# Patient Record
Sex: Female | Born: 1950
Health system: Southern US, Community
[De-identification: ages and names within clinical notes are randomized; demographics above are authoritative.]

## PROBLEM LIST (undated history)

## (undated) DIAGNOSIS — G43909 Migraine, unspecified, not intractable, without status migrainosus: Secondary | ICD-10-CM

## (undated) DIAGNOSIS — N879 Dysplasia of cervix uteri, unspecified: Secondary | ICD-10-CM

## (undated) DIAGNOSIS — M2460 Ankylosis, unspecified joint: Secondary | ICD-10-CM

## (undated) DIAGNOSIS — M545 Low back pain, unspecified: Secondary | ICD-10-CM

## (undated) DIAGNOSIS — F32A Depression, unspecified: Secondary | ICD-10-CM

## (undated) DIAGNOSIS — E079 Disorder of thyroid, unspecified: Secondary | ICD-10-CM

## (undated) DIAGNOSIS — F419 Anxiety disorder, unspecified: Secondary | ICD-10-CM

## (undated) DIAGNOSIS — T4145XA Adverse effect of unspecified anesthetic, initial encounter: Secondary | ICD-10-CM

## (undated) DIAGNOSIS — K56609 Unspecified intestinal obstruction, unspecified as to partial versus complete obstruction: Secondary | ICD-10-CM

## (undated) DIAGNOSIS — D45 Polycythemia vera: Secondary | ICD-10-CM

## (undated) DIAGNOSIS — R351 Nocturia: Secondary | ICD-10-CM

## (undated) DIAGNOSIS — G4730 Sleep apnea, unspecified: Secondary | ICD-10-CM

## (undated) DIAGNOSIS — T8859XA Other complications of anesthesia, initial encounter: Secondary | ICD-10-CM

## (undated) DIAGNOSIS — B029 Zoster without complications: Secondary | ICD-10-CM

## (undated) DIAGNOSIS — M797 Fibromyalgia: Secondary | ICD-10-CM

## (undated) DIAGNOSIS — M069 Rheumatoid arthritis, unspecified: Secondary | ICD-10-CM

## (undated) DIAGNOSIS — F329 Major depressive disorder, single episode, unspecified: Secondary | ICD-10-CM

## (undated) DIAGNOSIS — R0602 Shortness of breath: Secondary | ICD-10-CM

## (undated) DIAGNOSIS — M459 Ankylosing spondylitis of unspecified sites in spine: Secondary | ICD-10-CM

## (undated) DIAGNOSIS — E7800 Pure hypercholesterolemia, unspecified: Secondary | ICD-10-CM

## (undated) DIAGNOSIS — Z8719 Personal history of other diseases of the digestive system: Secondary | ICD-10-CM

## (undated) DIAGNOSIS — I10 Essential (primary) hypertension: Secondary | ICD-10-CM

## (undated) DIAGNOSIS — M1990 Unspecified osteoarthritis, unspecified site: Secondary | ICD-10-CM

## (undated) DIAGNOSIS — Z1371 Encounter for nonprocreative screening for genetic disease carrier status: Secondary | ICD-10-CM

## (undated) DIAGNOSIS — R011 Cardiac murmur, unspecified: Secondary | ICD-10-CM

## (undated) DIAGNOSIS — J45909 Unspecified asthma, uncomplicated: Secondary | ICD-10-CM

## (undated) DIAGNOSIS — D573 Sickle-cell trait: Secondary | ICD-10-CM

## (undated) DIAGNOSIS — E039 Hypothyroidism, unspecified: Secondary | ICD-10-CM

## (undated) DIAGNOSIS — K828 Other specified diseases of gallbladder: Secondary | ICD-10-CM

## (undated) DIAGNOSIS — K5790 Diverticulosis of intestine, part unspecified, without perforation or abscess without bleeding: Secondary | ICD-10-CM

## (undated) DIAGNOSIS — K219 Gastro-esophageal reflux disease without esophagitis: Secondary | ICD-10-CM

## (undated) DIAGNOSIS — R7302 Impaired glucose tolerance (oral): Secondary | ICD-10-CM

## (undated) HISTORY — DX: Migraine, unspecified, not intractable, without status migrainosus: G43.909

## (undated) HISTORY — DX: Essential (primary) hypertension: I10

## (undated) HISTORY — DX: Fibromyalgia: M79.7

## (undated) HISTORY — PX: GYNECOLOGIC CRYOSURGERY: SHX857

## (undated) HISTORY — PX: OOPHORECTOMY: SHX86

## (undated) HISTORY — DX: Ankylosing spondylitis of unspecified sites in spine: M45.9

## (undated) HISTORY — DX: Dysplasia of cervix uteri, unspecified: N87.9

## (undated) HISTORY — PX: TOTAL HIP ARTHROPLASTY: SHX124

## (undated) HISTORY — PX: BREAST SURGERY: SHX581

## (undated) HISTORY — PX: JOINT REPLACEMENT: SHX530

## (undated) HISTORY — DX: Gastro-esophageal reflux disease without esophagitis: K21.9

## (undated) HISTORY — DX: Encounter for nonprocreative screening for genetic disease carrier status: Z13.71

## (undated) HISTORY — PX: TONSILLECTOMY: SUR1361

## (undated) HISTORY — DX: Polycythemia vera: D45

## (undated) HISTORY — DX: Pure hypercholesterolemia, unspecified: E78.00

## (undated) HISTORY — DX: Sickle-cell trait: D57.3

## (undated) HISTORY — DX: Rheumatoid arthritis, unspecified: M06.9

## (undated) HISTORY — PX: PELVIC LAPAROSCOPY: SHX162

## (undated) HISTORY — PX: COLPOSCOPY: SHX161

## (undated) HISTORY — PX: HERNIA REPAIR: SHX51

## (undated) HISTORY — DX: Zoster without complications: B02.9

## (undated) HISTORY — DX: Ankylosis, unspecified joint: M24.60

## (undated) HISTORY — DX: Impaired glucose tolerance (oral): R73.02

## (undated) HISTORY — DX: Disorder of thyroid, unspecified: E07.9

## (undated) HISTORY — PX: CHOLECYSTECTOMY: SHX55

## (undated) HISTORY — DX: Unspecified intestinal obstruction, unspecified as to partial versus complete obstruction: K56.609

## (undated) HISTORY — PX: ABDOMINAL HYSTERECTOMY: SHX81

---

## 1981-01-23 HISTORY — PX: VAGINAL HYSTERECTOMY: SUR661

## 1997-01-23 HISTORY — PX: BREAST EXCISIONAL BIOPSY: SUR124

## 1997-09-01 ENCOUNTER — Ambulatory Visit (HOSPITAL_COMMUNITY): Admission: RE | Admit: 1997-09-01 | Discharge: 1997-09-01 | Payer: Self-pay | Admitting: Obstetrics and Gynecology

## 1998-09-01 ENCOUNTER — Other Ambulatory Visit: Admission: RE | Admit: 1998-09-01 | Discharge: 1998-09-01 | Payer: Self-pay | Admitting: Obstetrics and Gynecology

## 1998-09-07 ENCOUNTER — Ambulatory Visit (HOSPITAL_COMMUNITY): Admission: RE | Admit: 1998-09-07 | Discharge: 1998-09-07 | Payer: Self-pay | Admitting: Obstetrics and Gynecology

## 1998-09-07 ENCOUNTER — Encounter: Payer: Self-pay | Admitting: Obstetrics and Gynecology

## 1999-03-12 ENCOUNTER — Encounter: Payer: Self-pay | Admitting: Pulmonary Disease

## 1999-03-12 ENCOUNTER — Encounter: Admission: RE | Admit: 1999-03-12 | Discharge: 1999-03-12 | Payer: Self-pay | Admitting: Pulmonary Disease

## 1999-04-29 ENCOUNTER — Ambulatory Visit (HOSPITAL_COMMUNITY): Admission: RE | Admit: 1999-04-29 | Discharge: 1999-04-29 | Payer: Self-pay | Admitting: Neurosurgery

## 1999-04-29 ENCOUNTER — Encounter: Payer: Self-pay | Admitting: Neurosurgery

## 1999-07-06 ENCOUNTER — Encounter: Admission: RE | Admit: 1999-07-06 | Discharge: 1999-07-06 | Payer: Self-pay | Admitting: Obstetrics and Gynecology

## 1999-07-06 ENCOUNTER — Encounter: Payer: Self-pay | Admitting: Obstetrics and Gynecology

## 1999-09-09 ENCOUNTER — Ambulatory Visit (HOSPITAL_BASED_OUTPATIENT_CLINIC_OR_DEPARTMENT_OTHER): Admission: RE | Admit: 1999-09-09 | Discharge: 1999-09-09 | Payer: Self-pay

## 1999-09-09 ENCOUNTER — Encounter (INDEPENDENT_AMBULATORY_CARE_PROVIDER_SITE_OTHER): Payer: Self-pay | Admitting: Specialist

## 2000-01-13 ENCOUNTER — Other Ambulatory Visit: Admission: RE | Admit: 2000-01-13 | Discharge: 2000-01-13 | Payer: Self-pay | Admitting: Obstetrics and Gynecology

## 2000-05-16 ENCOUNTER — Emergency Department (HOSPITAL_COMMUNITY): Admission: EM | Admit: 2000-05-16 | Discharge: 2000-05-16 | Payer: Self-pay | Admitting: Emergency Medicine

## 2000-05-16 ENCOUNTER — Encounter: Payer: Self-pay | Admitting: Emergency Medicine

## 2000-05-17 ENCOUNTER — Emergency Department (HOSPITAL_COMMUNITY): Admission: EM | Admit: 2000-05-17 | Discharge: 2000-05-18 | Payer: Self-pay | Admitting: Emergency Medicine

## 2000-05-18 ENCOUNTER — Encounter: Payer: Self-pay | Admitting: Emergency Medicine

## 2000-05-31 ENCOUNTER — Encounter (INDEPENDENT_AMBULATORY_CARE_PROVIDER_SITE_OTHER): Payer: Self-pay | Admitting: Specialist

## 2000-05-31 ENCOUNTER — Other Ambulatory Visit: Admission: RE | Admit: 2000-05-31 | Discharge: 2000-05-31 | Payer: Self-pay | Admitting: Gastroenterology

## 2000-07-09 ENCOUNTER — Encounter: Payer: Self-pay | Admitting: Obstetrics and Gynecology

## 2000-07-09 ENCOUNTER — Ambulatory Visit (HOSPITAL_COMMUNITY): Admission: RE | Admit: 2000-07-09 | Discharge: 2000-07-09 | Payer: Self-pay | Admitting: Obstetrics and Gynecology

## 2000-07-24 ENCOUNTER — Encounter: Payer: Self-pay | Admitting: Specialist

## 2000-07-24 ENCOUNTER — Ambulatory Visit (HOSPITAL_COMMUNITY): Admission: RE | Admit: 2000-07-24 | Discharge: 2000-07-24 | Payer: Self-pay | Admitting: Specialist

## 2000-09-03 ENCOUNTER — Encounter (INDEPENDENT_AMBULATORY_CARE_PROVIDER_SITE_OTHER): Payer: Self-pay

## 2000-09-04 ENCOUNTER — Inpatient Hospital Stay (HOSPITAL_COMMUNITY): Admission: EM | Admit: 2000-09-04 | Discharge: 2000-09-06 | Payer: Self-pay | Admitting: Obstetrics and Gynecology

## 2000-09-05 ENCOUNTER — Encounter: Payer: Self-pay | Admitting: Obstetrics and Gynecology

## 2000-10-08 ENCOUNTER — Encounter: Admission: RE | Admit: 2000-10-08 | Discharge: 2000-10-08 | Payer: Self-pay | Admitting: Obstetrics and Gynecology

## 2000-10-08 ENCOUNTER — Encounter: Payer: Self-pay | Admitting: Obstetrics and Gynecology

## 2001-01-24 ENCOUNTER — Encounter: Payer: Self-pay | Admitting: *Deleted

## 2001-01-24 ENCOUNTER — Ambulatory Visit (HOSPITAL_COMMUNITY): Admission: RE | Admit: 2001-01-24 | Discharge: 2001-01-24 | Payer: Self-pay | Admitting: *Deleted

## 2001-02-01 ENCOUNTER — Other Ambulatory Visit: Admission: RE | Admit: 2001-02-01 | Discharge: 2001-02-01 | Payer: Self-pay | Admitting: Obstetrics and Gynecology

## 2001-07-06 ENCOUNTER — Encounter: Payer: Self-pay | Admitting: Internal Medicine

## 2001-07-06 ENCOUNTER — Ambulatory Visit (HOSPITAL_COMMUNITY): Admission: RE | Admit: 2001-07-06 | Discharge: 2001-07-06 | Payer: Self-pay | Admitting: Internal Medicine

## 2001-10-14 ENCOUNTER — Encounter: Payer: Self-pay | Admitting: Obstetrics and Gynecology

## 2001-10-14 ENCOUNTER — Encounter: Admission: RE | Admit: 2001-10-14 | Discharge: 2001-10-14 | Payer: Self-pay | Admitting: Obstetrics and Gynecology

## 2002-03-27 ENCOUNTER — Encounter: Payer: Self-pay | Admitting: Obstetrics and Gynecology

## 2002-03-27 ENCOUNTER — Encounter: Admission: RE | Admit: 2002-03-27 | Discharge: 2002-03-27 | Payer: Self-pay | Admitting: Obstetrics and Gynecology

## 2002-04-03 ENCOUNTER — Other Ambulatory Visit: Admission: RE | Admit: 2002-04-03 | Discharge: 2002-04-03 | Payer: Self-pay | Admitting: Obstetrics and Gynecology

## 2002-08-13 ENCOUNTER — Encounter: Payer: Self-pay | Admitting: Obstetrics and Gynecology

## 2002-08-13 ENCOUNTER — Encounter: Admission: RE | Admit: 2002-08-13 | Discharge: 2002-08-13 | Payer: Self-pay | Admitting: Obstetrics and Gynecology

## 2003-04-24 ENCOUNTER — Other Ambulatory Visit: Admission: RE | Admit: 2003-04-24 | Discharge: 2003-04-24 | Payer: Self-pay | Admitting: Obstetrics and Gynecology

## 2003-07-24 ENCOUNTER — Encounter: Admission: RE | Admit: 2003-07-24 | Discharge: 2003-07-24 | Payer: Self-pay | Admitting: Obstetrics and Gynecology

## 2003-09-01 ENCOUNTER — Ambulatory Visit (HOSPITAL_COMMUNITY): Admission: RE | Admit: 2003-09-01 | Discharge: 2003-09-01 | Payer: Self-pay | Admitting: Pulmonary Disease

## 2003-10-20 ENCOUNTER — Emergency Department (HOSPITAL_COMMUNITY): Admission: EM | Admit: 2003-10-20 | Discharge: 2003-10-20 | Payer: Self-pay | Admitting: Emergency Medicine

## 2004-01-21 ENCOUNTER — Ambulatory Visit: Payer: Self-pay | Admitting: Pulmonary Disease

## 2004-02-16 ENCOUNTER — Ambulatory Visit: Payer: Self-pay | Admitting: Pulmonary Disease

## 2004-03-06 ENCOUNTER — Ambulatory Visit (HOSPITAL_BASED_OUTPATIENT_CLINIC_OR_DEPARTMENT_OTHER): Admission: RE | Admit: 2004-03-06 | Discharge: 2004-03-06 | Payer: Self-pay | Admitting: Pulmonary Disease

## 2004-03-06 ENCOUNTER — Ambulatory Visit: Payer: Self-pay | Admitting: Pulmonary Disease

## 2004-04-14 ENCOUNTER — Ambulatory Visit: Payer: Self-pay | Admitting: Pulmonary Disease

## 2004-04-17 ENCOUNTER — Inpatient Hospital Stay (HOSPITAL_COMMUNITY): Admission: EM | Admit: 2004-04-17 | Discharge: 2004-04-22 | Payer: Self-pay | Admitting: Emergency Medicine

## 2004-04-25 ENCOUNTER — Other Ambulatory Visit: Admission: RE | Admit: 2004-04-25 | Discharge: 2004-04-25 | Payer: Self-pay | Admitting: Addiction Medicine

## 2004-04-29 ENCOUNTER — Ambulatory Visit: Payer: Self-pay | Admitting: Pulmonary Disease

## 2004-05-27 ENCOUNTER — Ambulatory Visit: Payer: Self-pay | Admitting: Pulmonary Disease

## 2004-05-30 ENCOUNTER — Ambulatory Visit: Payer: Self-pay | Admitting: Pulmonary Disease

## 2004-06-30 ENCOUNTER — Ambulatory Visit: Payer: Self-pay | Admitting: Pulmonary Disease

## 2004-07-01 ENCOUNTER — Emergency Department (HOSPITAL_COMMUNITY): Admission: EM | Admit: 2004-07-01 | Discharge: 2004-07-01 | Payer: Self-pay | Admitting: Emergency Medicine

## 2004-07-06 ENCOUNTER — Ambulatory Visit: Payer: Self-pay | Admitting: Pulmonary Disease

## 2004-08-08 ENCOUNTER — Ambulatory Visit: Payer: Self-pay | Admitting: Obstetrics and Gynecology

## 2004-09-22 ENCOUNTER — Ambulatory Visit: Payer: Self-pay | Admitting: Pulmonary Disease

## 2004-10-04 ENCOUNTER — Ambulatory Visit: Payer: Self-pay | Admitting: *Deleted

## 2004-11-07 ENCOUNTER — Ambulatory Visit: Payer: Self-pay

## 2004-11-21 ENCOUNTER — Ambulatory Visit: Payer: Self-pay | Admitting: Internal Medicine

## 2004-12-27 ENCOUNTER — Ambulatory Visit: Payer: Self-pay | Admitting: Cardiology

## 2004-12-28 ENCOUNTER — Ambulatory Visit: Payer: Self-pay | Admitting: Pulmonary Disease

## 2004-12-29 ENCOUNTER — Ambulatory Visit: Payer: Self-pay | Admitting: Family Medicine

## 2005-02-20 ENCOUNTER — Ambulatory Visit: Payer: Self-pay | Admitting: Cardiology

## 2005-02-21 ENCOUNTER — Ambulatory Visit: Payer: Self-pay

## 2005-02-28 ENCOUNTER — Ambulatory Visit: Payer: Self-pay | Admitting: Cardiology

## 2005-03-02 ENCOUNTER — Ambulatory Visit: Payer: Self-pay | Admitting: *Deleted

## 2005-05-22 ENCOUNTER — Ambulatory Visit: Payer: Self-pay | Admitting: Cardiology

## 2005-05-29 ENCOUNTER — Ambulatory Visit: Payer: Self-pay | Admitting: Cardiology

## 2005-06-21 ENCOUNTER — Other Ambulatory Visit: Admission: RE | Admit: 2005-06-21 | Discharge: 2005-06-21 | Payer: Self-pay | Admitting: Obstetrics and Gynecology

## 2005-07-27 ENCOUNTER — Ambulatory Visit: Payer: Self-pay | Admitting: Pulmonary Disease

## 2005-08-04 ENCOUNTER — Encounter: Admission: RE | Admit: 2005-08-04 | Discharge: 2005-08-04 | Payer: Self-pay | Admitting: Obstetrics and Gynecology

## 2005-08-08 ENCOUNTER — Ambulatory Visit: Payer: Self-pay | Admitting: Cardiology

## 2005-08-09 ENCOUNTER — Ambulatory Visit: Payer: Self-pay | Admitting: Pulmonary Disease

## 2005-08-15 ENCOUNTER — Ambulatory Visit: Payer: Self-pay | Admitting: Cardiology

## 2005-08-29 ENCOUNTER — Encounter: Admission: RE | Admit: 2005-08-29 | Discharge: 2005-08-29 | Payer: Self-pay | Admitting: Obstetrics and Gynecology

## 2005-08-30 ENCOUNTER — Ambulatory Visit: Payer: Self-pay | Admitting: Cardiology

## 2005-09-07 ENCOUNTER — Ambulatory Visit: Payer: Self-pay | Admitting: Cardiology

## 2005-09-13 ENCOUNTER — Encounter: Admission: RE | Admit: 2005-09-13 | Discharge: 2005-09-13 | Payer: Self-pay | Admitting: Orthopedic Surgery

## 2005-10-04 ENCOUNTER — Ambulatory Visit: Payer: Self-pay | Admitting: Pulmonary Disease

## 2005-10-05 ENCOUNTER — Encounter: Admission: RE | Admit: 2005-10-05 | Discharge: 2005-10-05 | Payer: Self-pay | Admitting: Orthopedic Surgery

## 2005-10-13 ENCOUNTER — Ambulatory Visit: Payer: Self-pay | Admitting: Pulmonary Disease

## 2005-11-01 ENCOUNTER — Ambulatory Visit: Payer: Self-pay | Admitting: Critical Care Medicine

## 2006-01-01 ENCOUNTER — Encounter: Admission: RE | Admit: 2006-01-01 | Discharge: 2006-01-01 | Payer: Self-pay | Admitting: Orthopedic Surgery

## 2006-02-22 ENCOUNTER — Ambulatory Visit: Payer: Self-pay | Admitting: Gastroenterology

## 2006-02-23 ENCOUNTER — Ambulatory Visit: Payer: Self-pay | Admitting: Gastroenterology

## 2006-06-24 ENCOUNTER — Encounter: Payer: Self-pay | Admitting: Internal Medicine

## 2006-07-12 ENCOUNTER — Other Ambulatory Visit: Admission: RE | Admit: 2006-07-12 | Discharge: 2006-07-12 | Payer: Self-pay | Admitting: Obstetrics and Gynecology

## 2006-08-06 ENCOUNTER — Ambulatory Visit (HOSPITAL_COMMUNITY): Admission: RE | Admit: 2006-08-06 | Discharge: 2006-08-06 | Payer: Self-pay | Admitting: Obstetrics and Gynecology

## 2006-08-20 ENCOUNTER — Ambulatory Visit: Payer: Self-pay | Admitting: Internal Medicine

## 2006-08-20 LAB — CONVERTED CEMR LAB
ALT: 19 units/L (ref 0–35)
AST: 18 units/L (ref 0–37)
Albumin: 3.3 g/dL — ABNORMAL LOW (ref 3.5–5.2)
Alkaline Phosphatase: 79 units/L (ref 39–117)
BUN: 9 mg/dL (ref 6–23)
Basophils Absolute: 0 10*3/uL (ref 0.0–0.1)
Basophils Relative: 0 % (ref 0.0–1.0)
CO2: 29 meq/L (ref 19–32)
Chloride: 109 meq/L (ref 96–112)
Creatinine, Ser: 0.8 mg/dL (ref 0.4–1.2)
HCT: 43.8 % (ref 36.0–46.0)
HDL: 33.4 mg/dL — ABNORMAL LOW (ref >39.0)
Hemoglobin, Urine: NEGATIVE
Hemoglobin: 15 g/dL (ref 12.0–15.0)
Ketones, ur: NEGATIVE mg/dL
LDL Cholesterol: 136 mg/dL — ABNORMAL HIGH (ref 0–99)
Leukocytes, UA: NEGATIVE
MCHC: 34.3 g/dL (ref 30.0–36.0)
Monocytes Relative: 4.4 % (ref 3.0–11.0)
Neutrophils Relative %: 77.4 % — ABNORMAL HIGH (ref 43.0–77.0)
RBC: 5.25 M/uL — ABNORMAL HIGH (ref 3.87–5.11)
RDW: 13.4 % (ref 11.5–14.6)
Specific Gravity, Urine: 1.015 (ref 1.000–1.03)
Total Bilirubin: 0.9 mg/dL (ref 0.3–1.2)
Total Protein, Urine: NEGATIVE mg/dL
Total Protein: 6.8 g/dL (ref 6.0–8.3)
Urobilinogen, UA: 0.2 (ref 0.0–1.0)
VLDL: 15 mg/dL (ref 0–40)
pH: 6 (ref 5.0–8.0)

## 2006-08-29 ENCOUNTER — Ambulatory Visit: Payer: Self-pay | Admitting: Pulmonary Disease

## 2006-09-19 ENCOUNTER — Ambulatory Visit: Payer: Self-pay | Admitting: Internal Medicine

## 2006-09-22 ENCOUNTER — Encounter: Payer: Self-pay | Admitting: Internal Medicine

## 2006-09-22 DIAGNOSIS — R5381 Other malaise: Secondary | ICD-10-CM | POA: Insufficient documentation

## 2006-09-22 DIAGNOSIS — M1990 Unspecified osteoarthritis, unspecified site: Secondary | ICD-10-CM | POA: Insufficient documentation

## 2006-09-22 DIAGNOSIS — E669 Obesity, unspecified: Secondary | ICD-10-CM

## 2006-09-22 DIAGNOSIS — M797 Fibromyalgia: Secondary | ICD-10-CM | POA: Insufficient documentation

## 2006-09-22 DIAGNOSIS — K589 Irritable bowel syndrome without diarrhea: Secondary | ICD-10-CM | POA: Insufficient documentation

## 2006-09-22 DIAGNOSIS — M810 Age-related osteoporosis without current pathological fracture: Secondary | ICD-10-CM | POA: Insufficient documentation

## 2006-09-22 DIAGNOSIS — K219 Gastro-esophageal reflux disease without esophagitis: Secondary | ICD-10-CM

## 2006-09-22 DIAGNOSIS — R609 Edema, unspecified: Secondary | ICD-10-CM | POA: Insufficient documentation

## 2006-09-22 DIAGNOSIS — F329 Major depressive disorder, single episode, unspecified: Secondary | ICD-10-CM

## 2006-09-22 DIAGNOSIS — Z8679 Personal history of other diseases of the circulatory system: Secondary | ICD-10-CM | POA: Insufficient documentation

## 2006-09-22 DIAGNOSIS — K279 Peptic ulcer, site unspecified, unspecified as acute or chronic, without hemorrhage or perforation: Secondary | ICD-10-CM | POA: Insufficient documentation

## 2006-09-22 DIAGNOSIS — E785 Hyperlipidemia, unspecified: Secondary | ICD-10-CM

## 2006-09-22 DIAGNOSIS — I872 Venous insufficiency (chronic) (peripheral): Secondary | ICD-10-CM | POA: Insufficient documentation

## 2006-09-22 DIAGNOSIS — D573 Sickle-cell trait: Secondary | ICD-10-CM | POA: Insufficient documentation

## 2006-09-22 DIAGNOSIS — J309 Allergic rhinitis, unspecified: Secondary | ICD-10-CM | POA: Insufficient documentation

## 2006-09-22 DIAGNOSIS — E0590 Thyrotoxicosis, unspecified without thyrotoxic crisis or storm: Secondary | ICD-10-CM | POA: Insufficient documentation

## 2006-09-22 DIAGNOSIS — IMO0001 Reserved for inherently not codable concepts without codable children: Secondary | ICD-10-CM | POA: Insufficient documentation

## 2006-09-22 DIAGNOSIS — G4733 Obstructive sleep apnea (adult) (pediatric): Secondary | ICD-10-CM | POA: Insufficient documentation

## 2006-09-22 DIAGNOSIS — R5383 Other fatigue: Secondary | ICD-10-CM

## 2006-09-22 DIAGNOSIS — I10 Essential (primary) hypertension: Secondary | ICD-10-CM

## 2006-10-29 ENCOUNTER — Ambulatory Visit: Payer: Self-pay | Admitting: Internal Medicine

## 2006-10-29 LAB — CONVERTED CEMR LAB
ALT: 11 units/L (ref 0–35)
AST: 15 units/L (ref 0–37)
Albumin: 3 g/dL — ABNORMAL LOW (ref 3.5–5.2)
Alkaline Phosphatase: 78 units/L (ref 39–117)
BUN: 10 mg/dL (ref 6–23)
Basophils Absolute: 0 10*3/uL (ref 0.0–0.1)
Basophils Relative: 0.5 % (ref 0.0–1.0)
Bilirubin, Direct: 0.1 mg/dL (ref 0.0–0.3)
CO2: 28 meq/L (ref 19–32)
Calcium: 8.8 mg/dL (ref 8.4–10.5)
Chloride: 110 meq/L (ref 96–112)
Creatinine, Ser: 0.8 mg/dL (ref 0.4–1.2)
Eosinophils Absolute: 0.1 10*3/uL (ref 0.0–0.6)
Eosinophils Relative: 1.1 % (ref 0.0–5.0)
GFR calc Af Amer: 96 mL/min
GFR calc non Af Amer: 80 mL/min
Glucose, Bld: 72 mg/dL (ref 70–99)
HCT: 39.9 % (ref 36.0–46.0)
Hemoglobin: 13.5 g/dL (ref 12.0–15.0)
Hgb A1c MFr Bld: 5.4 % (ref 4.6–6.0)
Lymphocytes Relative: 31.2 % (ref 12.0–46.0)
MCHC: 33.8 g/dL (ref 30.0–36.0)
MCV: 85.2 fL (ref 78.0–100.0)
Monocytes Absolute: 0.4 10*3/uL (ref 0.2–0.7)
Monocytes Relative: 6.2 % (ref 3.0–11.0)
Neutro Abs: 4.3 10*3/uL (ref 1.4–7.7)
Neutrophils Relative %: 61 % (ref 43.0–77.0)
Platelets: 282 10*3/uL (ref 150–400)
Potassium: 3.8 meq/L (ref 3.5–5.1)
RBC: 4.68 M/uL (ref 3.87–5.11)
RDW: 13.9 % (ref 11.5–14.6)
Sodium: 144 meq/L (ref 135–145)
TSH: 0.03 microintl units/mL — ABNORMAL LOW (ref 0.35–5.50)
Total Bilirubin: 0.6 mg/dL (ref 0.3–1.2)
Total Protein: 6.4 g/dL (ref 6.0–8.3)
WBC: 7 10*3/uL (ref 4.5–10.5)

## 2007-01-19 ENCOUNTER — Ambulatory Visit: Payer: Self-pay | Admitting: Family Medicine

## 2007-01-19 DIAGNOSIS — J069 Acute upper respiratory infection, unspecified: Secondary | ICD-10-CM | POA: Insufficient documentation

## 2007-02-22 ENCOUNTER — Encounter: Payer: Self-pay | Admitting: Internal Medicine

## 2007-04-02 ENCOUNTER — Ambulatory Visit: Payer: Self-pay | Admitting: Internal Medicine

## 2007-04-28 ENCOUNTER — Emergency Department (HOSPITAL_COMMUNITY): Admission: EM | Admit: 2007-04-28 | Discharge: 2007-04-29 | Payer: Self-pay | Admitting: Emergency Medicine

## 2007-05-01 ENCOUNTER — Ambulatory Visit: Payer: Self-pay | Admitting: Internal Medicine

## 2007-05-01 ENCOUNTER — Telehealth: Payer: Self-pay | Admitting: Pulmonary Disease

## 2007-05-15 ENCOUNTER — Ambulatory Visit: Payer: Self-pay | Admitting: Internal Medicine

## 2007-05-27 ENCOUNTER — Encounter: Payer: Self-pay | Admitting: Internal Medicine

## 2007-05-27 ENCOUNTER — Ambulatory Visit: Payer: Self-pay | Admitting: Gastroenterology

## 2007-05-31 ENCOUNTER — Ambulatory Visit: Payer: Self-pay | Admitting: Internal Medicine

## 2007-05-31 DIAGNOSIS — E739 Lactose intolerance, unspecified: Secondary | ICD-10-CM

## 2007-05-31 DIAGNOSIS — Z860100 Personal history of colon polyps, unspecified: Secondary | ICD-10-CM | POA: Insufficient documentation

## 2007-05-31 DIAGNOSIS — J449 Chronic obstructive pulmonary disease, unspecified: Secondary | ICD-10-CM | POA: Insufficient documentation

## 2007-05-31 DIAGNOSIS — J45909 Unspecified asthma, uncomplicated: Secondary | ICD-10-CM | POA: Insufficient documentation

## 2007-05-31 DIAGNOSIS — F411 Generalized anxiety disorder: Secondary | ICD-10-CM | POA: Insufficient documentation

## 2007-05-31 DIAGNOSIS — E039 Hypothyroidism, unspecified: Secondary | ICD-10-CM

## 2007-06-04 ENCOUNTER — Telehealth: Payer: Self-pay | Admitting: Internal Medicine

## 2007-06-06 ENCOUNTER — Encounter: Payer: Self-pay | Admitting: Internal Medicine

## 2007-06-06 ENCOUNTER — Ambulatory Visit: Payer: Self-pay

## 2007-06-08 ENCOUNTER — Encounter: Admission: RE | Admit: 2007-06-08 | Discharge: 2007-06-08 | Payer: Self-pay | Admitting: Internal Medicine

## 2007-06-10 ENCOUNTER — Telehealth: Payer: Self-pay | Admitting: Internal Medicine

## 2007-06-20 ENCOUNTER — Ambulatory Visit: Payer: Self-pay | Admitting: Internal Medicine

## 2007-06-24 ENCOUNTER — Telehealth (INDEPENDENT_AMBULATORY_CARE_PROVIDER_SITE_OTHER): Payer: Self-pay | Admitting: *Deleted

## 2007-06-26 ENCOUNTER — Telehealth: Payer: Self-pay | Admitting: Gastroenterology

## 2007-06-27 ENCOUNTER — Ambulatory Visit: Payer: Self-pay | Admitting: Internal Medicine

## 2007-06-27 ENCOUNTER — Observation Stay (HOSPITAL_COMMUNITY): Admission: EM | Admit: 2007-06-27 | Discharge: 2007-06-29 | Payer: Self-pay | Admitting: Emergency Medicine

## 2007-07-01 ENCOUNTER — Ambulatory Visit: Payer: Self-pay | Admitting: Gastroenterology

## 2007-07-02 ENCOUNTER — Ambulatory Visit: Payer: Self-pay | Admitting: Gastroenterology

## 2007-07-04 ENCOUNTER — Ambulatory Visit (HOSPITAL_COMMUNITY): Admission: RE | Admit: 2007-07-04 | Discharge: 2007-07-04 | Payer: Self-pay | Admitting: Gastroenterology

## 2007-07-05 ENCOUNTER — Telehealth: Payer: Self-pay | Admitting: Internal Medicine

## 2007-07-09 ENCOUNTER — Encounter: Payer: Self-pay | Admitting: Internal Medicine

## 2007-07-17 ENCOUNTER — Telehealth (INDEPENDENT_AMBULATORY_CARE_PROVIDER_SITE_OTHER): Payer: Self-pay | Admitting: *Deleted

## 2007-07-19 ENCOUNTER — Other Ambulatory Visit: Admission: RE | Admit: 2007-07-19 | Discharge: 2007-07-19 | Payer: Self-pay | Admitting: Obstetrics and Gynecology

## 2007-08-01 ENCOUNTER — Encounter: Payer: Self-pay | Admitting: Internal Medicine

## 2007-08-07 ENCOUNTER — Encounter: Admission: RE | Admit: 2007-08-07 | Discharge: 2007-08-07 | Payer: Self-pay | Admitting: Obstetrics and Gynecology

## 2007-08-13 ENCOUNTER — Ambulatory Visit: Payer: Self-pay | Admitting: Internal Medicine

## 2007-08-25 ENCOUNTER — Encounter: Payer: Self-pay | Admitting: Internal Medicine

## 2007-09-05 ENCOUNTER — Telehealth (INDEPENDENT_AMBULATORY_CARE_PROVIDER_SITE_OTHER): Payer: Self-pay | Admitting: *Deleted

## 2007-10-30 ENCOUNTER — Telehealth: Payer: Self-pay | Admitting: Gastroenterology

## 2007-11-01 ENCOUNTER — Telehealth (INDEPENDENT_AMBULATORY_CARE_PROVIDER_SITE_OTHER): Payer: Self-pay | Admitting: *Deleted

## 2007-11-07 ENCOUNTER — Ambulatory Visit: Payer: Self-pay | Admitting: Internal Medicine

## 2007-11-07 DIAGNOSIS — IMO0002 Reserved for concepts with insufficient information to code with codable children: Secondary | ICD-10-CM

## 2007-11-07 LAB — CONVERTED CEMR LAB
Crystals: NEGATIVE
Ketones, ur: NEGATIVE mg/dL
Specific Gravity, Urine: 1.025 (ref 1.000–1.03)
Urine Glucose: NEGATIVE mg/dL
Urobilinogen, UA: 0.2 (ref 0.0–1.0)

## 2007-11-12 ENCOUNTER — Encounter: Payer: Self-pay | Admitting: Internal Medicine

## 2007-11-13 ENCOUNTER — Encounter: Payer: Self-pay | Admitting: Internal Medicine

## 2007-11-14 ENCOUNTER — Encounter (INDEPENDENT_AMBULATORY_CARE_PROVIDER_SITE_OTHER): Payer: Self-pay | Admitting: *Deleted

## 2007-11-18 ENCOUNTER — Ambulatory Visit: Payer: Self-pay | Admitting: Gastroenterology

## 2007-11-18 DIAGNOSIS — K5900 Constipation, unspecified: Secondary | ICD-10-CM | POA: Insufficient documentation

## 2007-11-20 ENCOUNTER — Telehealth (INDEPENDENT_AMBULATORY_CARE_PROVIDER_SITE_OTHER): Payer: Self-pay | Admitting: *Deleted

## 2007-11-24 ENCOUNTER — Encounter: Admission: RE | Admit: 2007-11-24 | Discharge: 2007-11-24 | Payer: Self-pay | Admitting: Internal Medicine

## 2007-12-10 ENCOUNTER — Encounter: Payer: Self-pay | Admitting: Internal Medicine

## 2008-02-12 ENCOUNTER — Encounter: Payer: Self-pay | Admitting: Internal Medicine

## 2008-06-09 ENCOUNTER — Ambulatory Visit: Payer: Self-pay | Admitting: Obstetrics and Gynecology

## 2008-06-27 ENCOUNTER — Emergency Department (HOSPITAL_COMMUNITY): Admission: EM | Admit: 2008-06-27 | Discharge: 2008-06-27 | Payer: Self-pay | Admitting: Emergency Medicine

## 2008-07-01 ENCOUNTER — Telehealth: Payer: Self-pay | Admitting: Internal Medicine

## 2008-08-19 ENCOUNTER — Encounter: Admission: RE | Admit: 2008-08-19 | Discharge: 2008-08-19 | Payer: Self-pay | Admitting: Obstetrics and Gynecology

## 2008-09-04 ENCOUNTER — Ambulatory Visit: Payer: Self-pay | Admitting: Obstetrics and Gynecology

## 2008-09-04 ENCOUNTER — Other Ambulatory Visit: Admission: RE | Admit: 2008-09-04 | Discharge: 2008-09-04 | Payer: Self-pay | Admitting: Obstetrics and Gynecology

## 2008-09-04 ENCOUNTER — Encounter: Payer: Self-pay | Admitting: Obstetrics and Gynecology

## 2008-09-29 ENCOUNTER — Encounter: Payer: Self-pay | Admitting: Internal Medicine

## 2008-10-02 ENCOUNTER — Encounter: Payer: Self-pay | Admitting: Internal Medicine

## 2008-10-12 ENCOUNTER — Encounter: Payer: Self-pay | Admitting: Internal Medicine

## 2008-10-13 ENCOUNTER — Ambulatory Visit (HOSPITAL_COMMUNITY): Admission: RE | Admit: 2008-10-13 | Discharge: 2008-10-13 | Payer: Self-pay | Admitting: Rheumatology

## 2008-10-14 ENCOUNTER — Encounter: Payer: Self-pay | Admitting: Internal Medicine

## 2008-10-26 ENCOUNTER — Ambulatory Visit: Payer: Self-pay | Admitting: Internal Medicine

## 2008-10-26 DIAGNOSIS — E049 Nontoxic goiter, unspecified: Secondary | ICD-10-CM | POA: Insufficient documentation

## 2008-10-27 LAB — CONVERTED CEMR LAB: Hgb A1c MFr Bld: 5.3 % (ref 4.6–6.5)

## 2008-10-28 ENCOUNTER — Encounter: Admission: RE | Admit: 2008-10-28 | Discharge: 2008-10-28 | Payer: Self-pay | Admitting: Internal Medicine

## 2008-10-28 ENCOUNTER — Telehealth (INDEPENDENT_AMBULATORY_CARE_PROVIDER_SITE_OTHER): Payer: Self-pay | Admitting: *Deleted

## 2008-12-04 ENCOUNTER — Encounter: Payer: Self-pay | Admitting: Internal Medicine

## 2008-12-04 ENCOUNTER — Ambulatory Visit: Payer: Self-pay | Admitting: Obstetrics and Gynecology

## 2008-12-09 ENCOUNTER — Encounter: Admission: RE | Admit: 2008-12-09 | Discharge: 2008-12-09 | Payer: Self-pay | Admitting: Obstetrics and Gynecology

## 2008-12-10 ENCOUNTER — Ambulatory Visit: Payer: Self-pay | Admitting: Obstetrics and Gynecology

## 2008-12-10 ENCOUNTER — Encounter: Payer: Self-pay | Admitting: Internal Medicine

## 2008-12-11 ENCOUNTER — Ambulatory Visit: Payer: Self-pay | Admitting: Internal Medicine

## 2008-12-11 ENCOUNTER — Telehealth (INDEPENDENT_AMBULATORY_CARE_PROVIDER_SITE_OTHER): Payer: Self-pay | Admitting: *Deleted

## 2008-12-11 ENCOUNTER — Telehealth: Payer: Self-pay | Admitting: Internal Medicine

## 2008-12-11 DIAGNOSIS — J029 Acute pharyngitis, unspecified: Secondary | ICD-10-CM

## 2008-12-14 ENCOUNTER — Ambulatory Visit: Payer: Self-pay | Admitting: Obstetrics and Gynecology

## 2008-12-21 ENCOUNTER — Ambulatory Visit: Payer: Self-pay | Admitting: Obstetrics and Gynecology

## 2008-12-29 ENCOUNTER — Encounter: Payer: Self-pay | Admitting: Internal Medicine

## 2009-01-05 ENCOUNTER — Ambulatory Visit (HOSPITAL_COMMUNITY): Admission: RE | Admit: 2009-01-05 | Discharge: 2009-01-05 | Payer: Self-pay | Admitting: Obstetrics and Gynecology

## 2009-01-20 ENCOUNTER — Ambulatory Visit: Payer: Self-pay | Admitting: Obstetrics and Gynecology

## 2009-02-01 ENCOUNTER — Ambulatory Visit: Payer: Self-pay | Admitting: Obstetrics and Gynecology

## 2009-04-14 ENCOUNTER — Telehealth: Payer: Self-pay | Admitting: Internal Medicine

## 2009-04-20 ENCOUNTER — Encounter: Admission: RE | Admit: 2009-04-20 | Discharge: 2009-04-20 | Payer: Self-pay | Admitting: Internal Medicine

## 2009-04-30 ENCOUNTER — Telehealth: Payer: Self-pay | Admitting: Internal Medicine

## 2009-05-13 ENCOUNTER — Ambulatory Visit: Payer: Self-pay | Admitting: Women's Health

## 2009-06-04 ENCOUNTER — Encounter (INDEPENDENT_AMBULATORY_CARE_PROVIDER_SITE_OTHER): Payer: Self-pay | Admitting: *Deleted

## 2009-06-05 ENCOUNTER — Encounter (INDEPENDENT_AMBULATORY_CARE_PROVIDER_SITE_OTHER): Payer: Self-pay | Admitting: *Deleted

## 2009-06-22 ENCOUNTER — Telehealth: Payer: Self-pay | Admitting: Internal Medicine

## 2009-08-20 ENCOUNTER — Encounter: Admission: RE | Admit: 2009-08-20 | Discharge: 2009-08-20 | Payer: Self-pay | Admitting: Obstetrics and Gynecology

## 2009-09-16 ENCOUNTER — Encounter: Admission: RE | Admit: 2009-09-16 | Discharge: 2009-09-16 | Payer: Self-pay | Admitting: Obstetrics and Gynecology

## 2009-09-21 ENCOUNTER — Ambulatory Visit: Payer: Self-pay | Admitting: Obstetrics and Gynecology

## 2009-09-21 ENCOUNTER — Other Ambulatory Visit: Admission: RE | Admit: 2009-09-21 | Discharge: 2009-09-21 | Payer: Self-pay | Admitting: Obstetrics and Gynecology

## 2009-11-22 ENCOUNTER — Ambulatory Visit: Payer: Self-pay | Admitting: Gastroenterology

## 2009-12-30 ENCOUNTER — Inpatient Hospital Stay (HOSPITAL_COMMUNITY): Admission: EM | Admit: 2009-12-30 | Discharge: 2009-06-07 | Payer: Self-pay | Admitting: Emergency Medicine

## 2010-02-03 ENCOUNTER — Emergency Department (HOSPITAL_COMMUNITY)
Admission: EM | Admit: 2010-02-03 | Discharge: 2010-02-03 | Payer: Self-pay | Source: Home / Self Care | Admitting: Emergency Medicine

## 2010-02-03 ENCOUNTER — Encounter (INDEPENDENT_AMBULATORY_CARE_PROVIDER_SITE_OTHER): Payer: Self-pay | Admitting: *Deleted

## 2010-02-07 LAB — URINALYSIS, ROUTINE W REFLEX MICROSCOPIC
Bilirubin Urine: NEGATIVE
Hgb urine dipstick: NEGATIVE
Ketones, ur: NEGATIVE mg/dL
Nitrite: NEGATIVE
Protein, ur: NEGATIVE mg/dL
Specific Gravity, Urine: 1.013 (ref 1.005–1.030)
Urine Glucose, Fasting: NEGATIVE mg/dL
Urobilinogen, UA: 0.2 mg/dL (ref 0.0–1.0)
pH: 7.5 (ref 5.0–8.0)

## 2010-02-07 LAB — COMPREHENSIVE METABOLIC PANEL
ALT: 12 U/L (ref 0–35)
AST: 19 U/L (ref 0–37)
Albumin: 3.1 g/dL — ABNORMAL LOW (ref 3.5–5.2)
Alkaline Phosphatase: 43 U/L (ref 39–117)
BUN: 14 mg/dL (ref 6–23)
CO2: 29 mEq/L (ref 19–32)
Calcium: 8.9 mg/dL (ref 8.4–10.5)
Chloride: 105 mEq/L (ref 96–112)
Creatinine, Ser: 0.98 mg/dL (ref 0.4–1.2)
GFR calc Af Amer: >60 mL/min (ref >60)
GFR calc non Af Amer: 59 mL/min — ABNORMAL LOW (ref >60)
Glucose, Bld: 77 mg/dL (ref 70–99)
Potassium: 3.5 mEq/L (ref 3.5–5.1)
Sodium: 140 mEq/L (ref 135–145)
Total Bilirubin: 0.3 mg/dL (ref 0.3–1.2)
Total Protein: 6.8 g/dL (ref 6.0–8.3)

## 2010-02-07 LAB — CBC
HCT: 43.8 % (ref 36.0–46.0)
Hemoglobin: 14.9 g/dL (ref 12.0–15.0)
MCH: 29.8 pg (ref 26.0–34.0)
MCHC: 34 g/dL (ref 30.0–36.0)
MCV: 87.6 fL (ref 78.0–100.0)
Platelets: 333 10*3/uL (ref 150–400)
RBC: 5 MIL/uL (ref 3.87–5.11)
RDW: 13.3 % (ref 11.5–15.5)
WBC: 9.1 10*3/uL (ref 4.0–10.5)

## 2010-02-07 LAB — LIPASE, BLOOD: Lipase: 31 U/L (ref 11–59)

## 2010-02-12 ENCOUNTER — Encounter: Payer: Self-pay | Admitting: Orthopedic Surgery

## 2010-02-13 ENCOUNTER — Encounter: Payer: Self-pay | Admitting: Obstetrics and Gynecology

## 2010-02-20 LAB — CONVERTED CEMR LAB
ALT: 14 units/L (ref 0–35)
Basophils Relative: 2.5 % — ABNORMAL HIGH (ref 0.0–1.0)
Bilirubin, Direct: 0.1 mg/dL (ref 0.0–0.3)
CO2: 34 meq/L — ABNORMAL HIGH (ref 19–32)
Calcium: 8.8 mg/dL (ref 8.4–10.5)
Cholesterol: 262 mg/dL (ref 0–200)
Creatinine, Ser: 0.8 mg/dL (ref 0.4–1.2)
Direct LDL: 194.2 mg/dL
Eosinophils Relative: 0.7 % (ref 0.0–5.0)
Glucose, Bld: 84 mg/dL (ref 70–99)
Hemoglobin: 15.1 g/dL — ABNORMAL HIGH (ref 12.0–15.0)
Ketones, ur: NEGATIVE mg/dL
Leukocytes, UA: NEGATIVE
Lymphocytes Relative: 24.8 % (ref 12.0–46.0)
Monocytes Relative: 3.4 % (ref 3.0–12.0)
Neutro Abs: 6.9 10*3/uL (ref 1.4–7.7)
Nitrite: NEGATIVE
RBC: 5.31 M/uL — ABNORMAL HIGH (ref 3.87–5.11)
Specific Gravity, Urine: 1.015 (ref 1.000–1.03)
TSH: 1.93 microintl units/mL (ref 0.35–5.50)
Total CHOL/HDL Ratio: 4.8
Total Protein: 7.3 g/dL (ref 6.0–8.3)
pH: 6 (ref 5.0–8.0)

## 2010-02-22 NOTE — Progress Notes (Signed)
Summary: Call Report  Phone Note Other Incoming   Caller: [facility] Call Report Summary of Call: [facility] Triage Call Report Triage Record Num: [id number] Operator: Arline Asp Loftin Patient Name: [name] Call Date & Time: 06/18/2009 7:13:18PM Patient Phone: [phone] PCP: Oliver Barre Patient Gender: Female PCP Fax : [phone] Patient DOB: February 22, 1950 Practice Name: [facility] Reason for Call: Pt wanting to reimbursed for some of her medicines, and needs diagnosis codes for Fibromyalgia and Chronic Fatigue Syndrome. Instructed to call office for info when open. Protocol(s) Used: Office Note Recommended Outcome per Protocol: Information Noted and Sent to Office Reason for Outcome: Caller information to office Care Advice:  ~ 05/ Initial call taken by: Margaret Pyle, CMA,  Jun 22, 2009 7:57 AM  Follow-up for Phone Call        729.1 for fms, and 780.79 for chronic fatigue Follow-up by: Corwin Levins MD,  Jun 22, 2009 12:49 PM  Additional Follow-up for Phone Call Additional follow up Details #1::        pt informed Additional Follow-up by: Margaret Pyle, CMA,  Jun 22, 2009 1:50 PM

## 2010-02-22 NOTE — Progress Notes (Signed)
Summary: Ultrasound repeat  Phone Note Call from Patient Call back at Home Phone [phone]   Caller: Patient Summary of Call: pt called stating that she was told that a repeat of Ultrasound done in October is now due. Pt is requesting an appt to have US done. Initial call taken by: Margaret Pyle, CMA,  April 14, 2009 9:19 AM  Follow-up for Phone Call        referral don Follow-up by: Corwin Levins MD,  April 14, 2009 1:24 PM  Additional Follow-up for Phone Call Additional follow up Details #1::        pt aware that she will be contacted by [facility] with [place] information Additional Follow-up by: Margaret Pyle, CMA,  April 14, 2009 1:35 PM

## 2010-02-22 NOTE — Progress Notes (Signed)
  Phone Note Refill Request  on April 30, 2009 11:46 AM  Refills Requested: Medication #1:  VITAMIN B-12 CR 3000 MCG  TBCR (CYANOCOBALAMIN) 1 cc - injections every other day   Dosage confirmed as above?Dosage Confirmed   Notes: [facility] Initial call taken by: Scharlene Gloss,  April 30, 2009 11:46 AM    Prescriptions: VITAMIN B-12 CR 3000 MCG  TBCR (CYANOCOBALAMIN) 1 cc - injections every other day  #54mo supply x 2   Entered by:   Scharlene Gloss   Authorized by:   Corwin Levins MD   Signed by:   Scharlene Gloss on 04/30/2009   Method used:   Faxed to ...       [facility]* (retail)       [address]       [place], [place]  [address]       Ph: [phone]       Fax: [phone]   RxID:   [id number]

## 2010-03-01 ENCOUNTER — Other Ambulatory Visit: Payer: Medicare Other

## 2010-03-01 ENCOUNTER — Encounter: Payer: Self-pay | Admitting: Internal Medicine

## 2010-03-01 ENCOUNTER — Encounter (INDEPENDENT_AMBULATORY_CARE_PROVIDER_SITE_OTHER): Payer: Self-pay | Admitting: *Deleted

## 2010-03-01 ENCOUNTER — Ambulatory Visit (INDEPENDENT_AMBULATORY_CARE_PROVIDER_SITE_OTHER): Payer: Medicare Other | Admitting: Internal Medicine

## 2010-03-01 ENCOUNTER — Other Ambulatory Visit: Payer: Self-pay | Admitting: Internal Medicine

## 2010-03-01 ENCOUNTER — Ambulatory Visit (INDEPENDENT_AMBULATORY_CARE_PROVIDER_SITE_OTHER)
Admission: RE | Admit: 2010-03-01 | Discharge: 2010-03-01 | Disposition: A | Discharge status: Home / Self Care | Payer: Medicare Other | Source: Ambulatory Visit | Attending: Internal Medicine | Admitting: Internal Medicine

## 2010-03-01 DIAGNOSIS — R079 Chest pain, unspecified: Secondary | ICD-10-CM

## 2010-03-01 DIAGNOSIS — E739 Lactose intolerance, unspecified: Secondary | ICD-10-CM

## 2010-03-01 DIAGNOSIS — R0602 Shortness of breath: Secondary | ICD-10-CM | POA: Insufficient documentation

## 2010-03-01 DIAGNOSIS — E041 Nontoxic single thyroid nodule: Secondary | ICD-10-CM | POA: Insufficient documentation

## 2010-03-01 DIAGNOSIS — R5381 Other malaise: Secondary | ICD-10-CM

## 2010-03-01 DIAGNOSIS — Z136 Encounter for screening for cardiovascular disorders: Secondary | ICD-10-CM

## 2010-03-01 DIAGNOSIS — R350 Frequency of micturition: Secondary | ICD-10-CM

## 2010-03-01 DIAGNOSIS — R5383 Other fatigue: Secondary | ICD-10-CM

## 2010-03-01 DIAGNOSIS — M549 Dorsalgia, unspecified: Secondary | ICD-10-CM | POA: Insufficient documentation

## 2010-03-01 LAB — CBC WITH DIFFERENTIAL/PLATELET
Eosinophils Relative: 0.6 % (ref 0.0–5.0)
HCT: 45.1 % (ref 36.0–46.0)
Hemoglobin: 15.2 g/dL — ABNORMAL HIGH (ref 12.0–15.0)
Lymphs Abs: 2 10*3/uL (ref 0.7–4.0)
Monocytes Relative: 4.6 % (ref 3.0–12.0)
Platelets: 242 10*3/uL (ref 150.0–400.0)
RBC: 5.02 Mil/uL (ref 3.87–5.11)
WBC: 7.6 10*3/uL (ref 4.5–10.5)

## 2010-03-01 LAB — HEPATIC FUNCTION PANEL
ALT: 14 U/L (ref 0–35)
AST: 18 U/L (ref 0–37)
Total Bilirubin: 0.4 mg/dL (ref 0.3–1.2)

## 2010-03-01 LAB — TSH: TSH: 1.4 u[IU]/mL (ref 0.35–5.50)

## 2010-03-01 LAB — URINALYSIS, ROUTINE W REFLEX MICROSCOPIC
Ketones, ur: NEGATIVE
Specific Gravity, Urine: 1.02 (ref 1.000–1.030)
Urine Glucose: NEGATIVE
Urobilinogen, UA: 0.2 (ref 0.0–1.0)
pH: 5.5 (ref 5.0–8.0)

## 2010-03-01 LAB — BASIC METABOLIC PANEL
BUN: 14 mg/dL (ref 6–23)
GFR: 87.37 mL/min (ref >60.00)
Potassium: 4.3 mEq/L (ref 3.5–5.1)
Sodium: 141 mEq/L (ref 135–145)

## 2010-03-04 ENCOUNTER — Other Ambulatory Visit (HOSPITAL_COMMUNITY): Payer: Medicare Other

## 2010-03-10 NOTE — Assessment & Plan Note (Signed)
Summary: EXTREME FATIGUE  PER PT   STC   Vital Signs:  Patient profile:   60 year old female Height:      64.5 inches Weight:      200.50 pounds BMI:     34.01 O2 Sat:      98 % on Room air Temp:     98.7 degrees F oral Pulse rate:   68 / minute BP sitting:   102 / 74  (left arm) Cuff size:   large  Vitals Entered By: Zella Ball Ewing CMA (AAMA) (March 01, 2010 10:20 AM)  O2 Flow:  Room air CC: Fatigue/RE   Primary Care Provider:  Oliver Barre, MD  CC:  Fatigue/RE.  History of Present Illness: here to f/u - father died in 01-03-23 and still grieving,  feels low on occasion with low mood, but she is here to c/o primarily  extreme fatigue and lack of energy for 1-2 months with gradual onset and worsening, without daytime somnolence.    Denies  suicidal ideation, or panicm though has ongoing anxiety. .   Has several somatic symptoms  that scare her - has some wt gain, less active, and c/o CP left upper chest, sharp, pleuritic, not assoc with sob, n/v, diaphoresis, radiation , palp or syncope.   Also c/o pain to the right neck and upper back, sharp, mild without radiation or radicular symptoms, bowel or bladder change, gait change, falls, extremity pain/weak/numb, fever or wt loss. Pt denies other CP, worsening  wheezing, orthopnea, pnd, worsening LE edema, palps, dizziness or syncope, but does need ventolin inhaler refill for ongoing dyspnea which usually helps.  No fever, wt loss, night sweats, loss of appetite or other constitutional symptoms  Overall good compliance with meds, and good tolerability.   Pt denies new neuro symptoms such as headache, facial or extremity weakness.  Pt denies polydipsia, polyuria, though she is very worried about developing DM.  Does have urinary frequency - has to go 2-3 times per hour for years, without current pain, urgency, or hematuria.    Also to see Dr Sharmon Leyden Feb 17 - has chronic consiptaiton, has to use the daily miralax,  and has episodes of severe  painin past few months that caused her once to go to the ER once about  3 wks ago - eval neg and asked to take the miralax daily;  she is afraid the obstruction may come back.  No recent n/v, increasd abd pain, blood.  Also on new statin livalo 4 mg once daily,  (welchol caused hair loss?), also still on the fentanyl, both of which can cause constipation, of which I informed her today.    Preventive Screening-Counseling & Management      Drug Use:  no.    Problems Prior to Update: 1)  Dyspnea  (ICD-786.05) 2)  Frequency, Urinary  (ICD-788.41) 3)  Chest Pain  (ICD-786.50) 4)  Back Pain  (ICD-724.5) 5)  Thyroid Nodule, Right  (ICD-241.0) 6)  Fatigue  (ICD-780.79) 7)  Acute Pharyngitis  (ICD-462) 8)  Goiter  (ICD-240.9) 9)  Constipation  (ICD-564.00) 10)  Lumbar Radiculopathy, Left  (ICD-724.4) 11)  Family Hx Colon Cancer  (ICD-V16.0) 12)  Glucose Intolerance  (ICD-271.3) 13)  Hypothyroidism  (ICD-244.9) 14)  Anxiety  (ICD-300.00) 15)  Colonic Polyps, Hx of  (ICD-V12.72) 16)  COPD  (ICD-496) 17)  Asthma  (ICD-493.90) 18)  Uri  (ICD-465.9) 19)  Strep Throat  (icd-034.0) 20)  Hyperlipidemia  (ICD-272.4) 21)  Osteoarthritis  (  ICD-715.90) 22)  Hyperthyroidism  (ICD-242.90) 23)  Hypertension  (ICD-401.9) 24)  Depression  (ICD-311) 25)  Sleep Apnea, Obstructive, Moderate  (ICD-327.23) 26)  Mitral Valve Prolapse, Hx of  (ICD-V12.50) 27)  Sickle Cell Trait  (ICD-282.5) 28)  Peptic Ulcer Disease  (ICD-533.90) 29)  Osteoporosis  (ICD-733.00) 30)  Gerd  (ICD-530.81) 31)  Allergic Rhinitis  (ICD-477.9) 32)  Fatigue, Chronic  (ICD-780.79) 33)  Fibromyalgia  (ICD-729.1) 34)  Peripheral Edema  (ICD-782.3) 35)  Venous Insufficiency  (ICD-459.81) 36)  Irritable Bowel Syndrome  (ICD-564.1) 37)  Obesity  (ICD-278.00)  Medications Prior to Update: 1)  Furosemide 40 Mg  Tabs (Furosemide) .Marland Kitchen.. 1po Once Daily 2)  Vitamin B-12 Cr 3000 Mcg  Tbcr (Cyanocobalamin) .Marland Kitchen.. 1 Cc - Injections Every Other  Day 3)  Armour Thyroid 30 Mg  Tabs (Thyroid) .Marland Kitchen.. 1 By Mouth Once Daily 4)  Bentyl 10 Mg  Caps (Dicyclomine Hcl) .... One Tablet By Mouth Four Times A Day As Needed 5)  Cozaar 100 Mg Tabs (Losartan Potassium) .Marland Kitchen.. 1 By Mouth Once Daily 6)  Cpap Nightly 7)  Xopenex Concentrate 1.25 Mg/0.80ml Nebu (Levalbuterol Hcl) .... Use Asd Qid As Needed 8)  Ventolin Hfa 108 (90 Base) Mcg/act Aers (Albuterol Sulfate) 9)  Flector 1.3 % Ptch (Diclofenac Epolamine) 10)  Automatic Blood Pressure Monitor .... Use Asd 11)  Tizanidine Hcl 4 Mg Tabs (Tizanidine Hcl) .... Use Breakthrough For Migraine 12)  Welchol 625 Mg Tabs (Colesevelam Hcl) .... Take 3 Two Times A Day 13)  Duragesic-50 50 Mcg/hr Pt72 (Fentanyl) .... Use 1 Patch Q 3 Days 14)  Topamax 100 Mg Tabs (Topiramate) .... Take 1 Three Times A Day and 1 At Bedtime 15)  Lactulose 10 Gm/13ml Soln (Lactulose) 16)  Voltaren 1 % Gel (Diclofenac Sodium) 17)  Climara 0.06 Mg/24hr Ptwk (Estradiol) .Marland Kitchen.. 1 Patch Q Week 18)  Vitamin D 1200mg  .... Once Daily  Current Medications (verified): 1)  Furosemide 40 Mg  Tabs (Furosemide) .Marland Kitchen.. 1po Once Daily As Needed For Swelling 2)  Vitamin B-12 Cr 3000 Mcg  Tbcr (Cyanocobalamin) .Marland Kitchen.. 1 Cc - Injections Every Other Day 3)  Armour Thyroid 30 Mg  Tabs (Thyroid) .Marland Kitchen.. 1 By Mouth Once Daily 4)  Losartan Potassium 50 Mg Tabs (Losartan Potassium) .Marland Kitchen.. 1 By Mouth Once Daily 5)  Cpap Nightly 6)  Xopenex Concentrate 1.25 Mg/0.35ml Nebu (Levalbuterol Hcl) .... Use Asd Qid As Needed 7)  Ventolin Hfa 108 (90 Base) Mcg/act Aers (Albuterol Sulfate) .... 2 Puffs Four Times Per Day As Needed 8)  Flector 1.3 % Ptch (Diclofenac Epolamine) 9)  Tizanidine Hcl 4 Mg Tabs (Tizanidine Hcl) .... Use Breakthrough For Migraine 10)  Fentanyl 25 Mcg/hr Pt72 (Fentanyl) .Marland Kitchen.. 1 Patch Q 3 Days 11)  Topamax 100 Mg Tabs (Topiramate) .... Take 1 Three Times A Day and 1 At Bedtime 12)  Lactulose 10 Gm/17ml Soln (Lactulose) 13)  Voltaren 1 % Gel (Diclofenac  Sodium) 14)  Climara 0.06 Mg/24hr Ptwk (Estradiol) .Marland Kitchen.. 1 Patch Q Week 15)  Vitamin D 1200mg  .... Once Daily 16)  Livalo 4 Mg Tabs (Pitavastatin Calcium) .Marland Kitchen.. 1 By Mouth Once Daily 17)  Pantoprazole Sodium 40 Mg Tbec (Pantoprazole Sodium) .Marland Kitchen.. 1 By Mouth Once Daily 18)  Toviaz 4 Mg Xr24h-Tab (Fesoterodine Fumarate) .Marland Kitchen.. 1 By Mouth Once Daily  Allergies (verified): 1)  ! Codeine 2)  ! * Ivp Dye 3)  ! Celebrex 4)  ! Erythromycin 5)  ! Vioxx 6)  ! * Zetia 7)  ! * Statins  8)  * Savella 9)  * Cymbalta 10)  * Lyrica  Past History:  Past Medical History: Last updated: 11/19/2009 Obesity IBS Chronic constipation Venous Insufficiency Peripheral Edema Fibromylagia Chronic Fatigue Allergic rhinitis GERD Osteoporosis Peptic ulcer disease - 80's H/o Sickle Cell Trait H/O Mitral Valve Prolapse Obstructive Sleep Apnea, moderate Depression Hypertension Hyperthyroidism Osteoarthritis Hyperlipidemia Low back pain/lumbar disc disease/sciatica Asthma/chronic bronchitis per pt knee DJD COPD migraine Anxiety Hypothyroidism glucose intolerance Diverticulosis Hiatal Hernia Partial small bowel obstruction 05/2009  Past Surgical History: Last updated: 05/31/2007 Hysterectomy- 1983 Oophorectomy and benign tumor removal- 2001 Tubal ligation Tonsillectomy-1969 Breast Cyst removal - 2000 - left  Family History: Last updated: 11/07/2007 Family History Hypertension mother with breast cancer - died 10-04-2007 sister with colon cancer father and brother with CHF uncle with stroke neice with DM sister and brother  with sardoidosis  Social History: Last updated: 03/01/2010 Never Smoked Divorced Alcohol use-yes 2 children disabled - fibromyalgia - per dr Corliss Skains Drug use-no  Risk Factors: Smoking Status: never (05/01/2007)  Social History: Never Smoked Divorced Alcohol use-yes 2 children disabled - fibromyalgia - per dr Corliss Skains Drug use-no Drug Use:   no  Review of Systems  The patient denies anorexia, fever, vision loss, decreased hearing, hoarseness, syncope, dyspnea on exertion, peripheral edema, prolonged cough, headaches, hemoptysis, melena, hematochezia, severe indigestion/heartburn, hematuria, muscle weakness, suspicious skin lesions, transient blindness, difficulty walking, unusual weight change, abnormal bleeding, enlarged lymph nodes, and angioedema.         all otherwise negative per pt -    Physical Exam  General:  alert and overweight-appearing.   Head:  normocephalic and atraumatic.   Eyes:  vision grossly intact, pupils equal, and pupils round.   Ears:  R ear normal and L ear normal.   Nose:  no external deformity and no nasal discharge.   Mouth:  good dentition and pharynx pink and moist.   Neck:  supple and no masses.   Lungs:  normal respiratory effort, no intercostal retractions or use of accessory muscles; normal breath sounds bilaterally - no crackles and no wheezes.    Heart:  normal rate, regular rhythm, no murmur, and no rub. BLE without edema.  Abdomen:  soft, non-tender, and normal bowel sounds.   Msk:  no joint tenderness and no joint swelling. , neck and upper back without tenderness or paravertebral tender, no rash or swelling Extremities:  no edema, no erythema  Neurologic:  cranial nerves II-XII intact and strength normal in all extremities.   Skin:  color normal and no rashes.   Psych:  dysphoric affect and moderately anxious.     Impression & Recommendations:  Problem # 1:  FATIGUE (ICD-780.79)  more exhausted type of fatigue per pt   -  exam overall benign, to check labs below; follow with expectant management  - ? psych component  Orders: TLB-BMP (Basic Metabolic Panel-BMET) (80048-METABOL) TLB-CBC Platelet - w/Differential (85025-CBCD) TLB-Hepatic/Liver Function Pnl (80076-HEPATIC) TLB-TSH (Thyroid Stimulating Hormone) (84443-TSH)  Problem # 2:  THYROID NODULE, RIGHT  (ICD-241.0) Assessment: Improved  for f/u thyroid u/s - due for right thyroid nodule f/u  Orders: Radiology Referral (Radiology)  Problem # 3:  CHEST PAIN (ICD-786.50) atypical - for ecg and CXR, pain is pleuritic, will hold on stress testing - suspect MSK etiology/chest wall  Orders: EKG w/ Interpretation (93000) T-2 View CXR, Same Day (71020.5TC)  Problem # 4:  BACK PAIN (ICD-724.5)  Her updated medication list for this problem includes:    Tizanidine  Hcl 4 Mg Tabs (Tizanidine hcl) ..... Use breakthrough for migraine    Fentanyl 25 Mcg/hr Pt72 (Fentanyl) .Marland Kitchen... 1 patch q 3 days right upper back - c/w MSK strain  most likely - exam benign, though could be related to FMS as well  Problem # 5:  FREQUENCY, URINARY (ICD-788.41)  ? OAB - for urine stuides, and trial toviaz 4 mg once daily , to urology if not improved Orders: T-Culture, Urine ([id number]) TLB-Udip w/ Micro (81001-URINE)  Her updated medication list for this problem includes:    Toviaz 4 Mg Xr24h-tab (Fesoterodine fumarate) .Marland Kitchen... 1 by mouth once daily  Problem # 6:  DYSPNEA (ICD-786.05) ? etiology  - exam benign, Continue all previous medications as before this visit including the inhalers but will check cxr, echo Orders: TLB-Udip w/ Micro (81001-URINE) T-Culture, Urine ([id number])  Problem # 7:  DEPRESSION (ICD-311)  to consider further tx but delines counseling or SSRI tx at this time  Discussed treatment options, including trial of antidpressant medication. Verified that the patient has no suicidal ideation at this time.   Problem # 8:  HYPERTENSION (ICD-401.9)  Her updated medication list for this problem includes:    Furosemide 40 Mg Tabs (Furosemide) .Marland Kitchen... 1po once daily as needed for swelling    Losartan Potassium 50 Mg Tabs (Losartan potassium) .Marland Kitchen... 1 by mouth once daily ok to decrease the losartan to 50 mg with lower blood pressure today as she may be overcontrolled  BP today: 102/74 Prior BP:  118/90 (12/11/2008)  Prior 10 Yr Risk Heart Disease: Not enough information (10/26/2008)  Labs Reviewed: K+: 3.3 (05/31/2007) Creat: : 0.8 (05/31/2007)   Chol: 262 (05/31/2007)   HDL: 54.1 (05/31/2007)   LDL: DEL (05/31/2007)   TG: 46 (05/31/2007)  Complete Medication List: 1)  Furosemide 40 Mg Tabs (Furosemide) .Marland Kitchen.. 1po once daily as needed for swelling 2)  Vitamin B-12 Cr 3000 Mcg Tbcr (cyanocobalamin)  .Marland Kitchen.. 1 cc - injections every other day 3)  Armour Thyroid 30 Mg Tabs (Thyroid) .Marland Kitchen.. 1 by mouth once daily 4)  Losartan Potassium 50 Mg Tabs (Losartan potassium) .Marland Kitchen.. 1 by mouth once daily 5)  Cpap Nightly  6)  Xopenex Concentrate 1.25 Mg/0.52ml Nebu (Levalbuterol hcl) .... Use asd qid as needed 7)  Ventolin Hfa 108 (90 Base) Mcg/act Aers (Albuterol sulfate) .... 2 puffs four times per day as needed 8)  Flector 1.3 % Ptch (Diclofenac epolamine) 9)  Tizanidine Hcl 4 Mg Tabs (Tizanidine hcl) .... Use breakthrough for migraine 10)  Fentanyl 25 Mcg/hr Pt72 (Fentanyl) .Marland Kitchen.. 1 patch q 3 days 11)  Topamax 100 Mg Tabs (Topiramate) .... Take 1 three times a day and 1 at bedtime 12)  Lactulose 10 Gm/56ml Soln (Lactulose) 13)  Voltaren 1 % Gel (Diclofenac sodium) 14)  Climara 0.06 Mg/24hr Ptwk (Estradiol) .Marland Kitchen.. 1 patch q week 15)  Vitamin D 1200mg   .... Once daily 16)  Livalo 4 Mg Tabs (Pitavastatin calcium) .Marland Kitchen.. 1 by mouth once daily 17)  Pantoprazole Sodium 40 Mg Tbec (Pantoprazole sodium) .Marland Kitchen.. 1 by mouth once daily 18)  Toviaz 4 Mg Xr24h-tab (Fesoterodine fumarate) .Marland Kitchen.. 1 by mouth once daily  Other Orders: Echo Referral (Echo) TLB-A1C / Hgb A1C (Glycohemoglobin) (83036-A1C)  Patient Instructions: 1)  Please go to the Lab in the basement for your blood and/or urine tests today  2)  Your EKG was ok today 3)  Please go to Radiology in the basement level for your X-Ray today  4)  You will be contacted  about the referral(s) to: thyroid u/s, and echocardiogram 5)  Please take all new medications  as prescribed  - remember to take only HALF of the Toviaz 8 mg samples 6)  After 4 wks, you can call if you feel it is working but might like to try the whole 8 mg 7)  Continue all previous medications as before this visit. except decrease the losartan to 50 mg per day 8)  Please schedule a follow-up appointment in 1 month, or sooner if needed Prescriptions: TOVIAZ 4 MG XR24H-TAB (FESOTERODINE FUMARATE) 1 by mouth once daily  #90 x 3   Entered and Authorized by:   Corwin Levins MD   Signed by:   Corwin Levins MD on 03/01/2010   Method used:   Print then Give to Patient   RxID:   [id number] VITAMIN B-12 CR 3000 MCG  TBCR (CYANOCOBALAMIN) 1 cc - injections every other day  #45mosupply x 3   Entered and Authorized by:   Corwin Levins MD   Signed by:   Corwin Levins MD on 03/01/2010   Method used:   Print then Give to Patient   RxID:   [id number] POTASSIUM 50 MG TABS (LOSARTAN POTASSIUM) 1 by mouth once daily  #90 x 3   Entered and Authorized by:   Corwin Levins MD   Signed by:   Corwin Levins MD on 03/01/2010   Method used:   Print then Give to Patient   RxID:   [id number] VENTOLIN HFA 108 (90 BASE) MCG/ACT AERS (ALBUTEROL SULFATE) 2 puffs four times per day as needed  #1 x 11   Entered and Authorized by:   Corwin Levins MD   Signed by:   Corwin Levins MD on 03/01/2010   Method used:   Print then Give to Patient   RxID:   [id number] PANTOPRAZOLE SODIUM 40 MG TBEC (PANTOPRAZOLE SODIUM) 1 by mouth once daily  #90 x 3   Entered and Authorized by:   Corwin Levins MD   Signed by:   Corwin Levins MD on 03/01/2010   Method used:   Print then Give to Patient   RxID:   [id number]    Orders Added: 1)  EKG w/ Interpretation [93000] 2)  T-2 View CXR, Same Day [71020.5TC] 3)  T-Culture, Urine [[id number]] 4)  Echo Referral [Echo] 5)  Radiology Referral [Radiology] 6)  TLB-BMP (Basic Metabolic Panel-BMET) [80048-METABOL] 7)  TLB-CBC Platelet - w/Differential  [85025-CBCD] 8)  TLB-Hepatic/Liver Function Pnl [80076-HEPATIC] 9)  TLB-TSH (Thyroid Stimulating Hormone) [84443-TSH] 10)  TLB-A1C / Hgb A1C (Glycohemoglobin) [83036-A1C] 11)  TLB-Udip w/ Micro [81001-URINE] 12)  Est. Patient Level V [51761]

## 2010-03-11 ENCOUNTER — Other Ambulatory Visit: Payer: Medicare Other

## 2010-03-11 ENCOUNTER — Encounter: Payer: Self-pay | Admitting: Gastroenterology

## 2010-03-11 ENCOUNTER — Encounter (INDEPENDENT_AMBULATORY_CARE_PROVIDER_SITE_OTHER): Payer: Self-pay | Admitting: *Deleted

## 2010-03-11 ENCOUNTER — Ambulatory Visit (INDEPENDENT_AMBULATORY_CARE_PROVIDER_SITE_OTHER): Payer: Medicare Other | Admitting: Gastroenterology

## 2010-03-11 DIAGNOSIS — Z800 Family history of malignant neoplasm of digestive organs: Secondary | ICD-10-CM | POA: Insufficient documentation

## 2010-03-11 DIAGNOSIS — K219 Gastro-esophageal reflux disease without esophagitis: Secondary | ICD-10-CM

## 2010-03-11 DIAGNOSIS — K589 Irritable bowel syndrome without diarrhea: Secondary | ICD-10-CM

## 2010-03-11 DIAGNOSIS — K5900 Constipation, unspecified: Secondary | ICD-10-CM

## 2010-03-15 ENCOUNTER — Encounter (INDEPENDENT_AMBULATORY_CARE_PROVIDER_SITE_OTHER): Payer: Self-pay | Admitting: *Deleted

## 2010-03-15 ENCOUNTER — Other Ambulatory Visit: Payer: Medicare Other

## 2010-03-15 ENCOUNTER — Other Ambulatory Visit: Payer: Self-pay | Admitting: Gastroenterology

## 2010-03-15 DIAGNOSIS — K589 Irritable bowel syndrome without diarrhea: Secondary | ICD-10-CM

## 2010-03-15 DIAGNOSIS — K5900 Constipation, unspecified: Secondary | ICD-10-CM

## 2010-03-15 LAB — CONVERTED CEMR LAB: Tissue Transglutaminase Ab, IgA: 18.7 units (ref <20)

## 2010-03-16 LAB — IGA: IgA: 202 mg/dL (ref 68–378)

## 2010-03-16 NOTE — Assessment & Plan Note (Addendum)
Summary: ABD PAIN.[name] WITH PT CX POLICY ADVISED   History of Present Illness Visit Type: follow up Primary GI MD: Elie Goody MD [facility] Primary Provider: Oliver Barre, MD Requesting Provider: na Chief Complaint: Patient complains of abdominal pain. She complains of her bowels not being able to empty. She is taking Miralax as needed.  History of Present Illness:    This is a 60 year old female who has chronic problems with constipation and intermittent abdominal pain. She was hospitalized for partial small bowel obstruction last year. She does not take MiraLax or lactulose on a regular basis, just as needed. She occasionally notes crampy left-sided abdominal pain. Bentyl has helped persistent in the past but she no longer has Bentyl.  She is concerned about a gluten allergy.   GI Review of Systems    Reports abdominal pain, acid reflux, and  nausea.     Location of  Abdominal pain: left side.    Denies belching, bloating, chest pain, dysphagia with liquids, dysphagia with solids, heartburn, loss of appetite, vomiting, vomiting blood, weight loss, and  weight gain.      Reports constipation and  irritable bowel syndrome.     Denies anal fissure, black tarry stools, change in bowel habit, diarrhea, diverticulosis, fecal incontinence, heme positive stool, hemorrhoids, jaundice, light color stool, liver problems, rectal bleeding, and  rectal pain.   Current Medications (verified): 1)  Furosemide 40 Mg  Tabs (Furosemide) .Marland Kitchen.. 1po Once Daily As Needed For Swelling 2)  Vitamin B-12 Cr 3000 Mcg  Tbcr (Cyanocobalamin) .Marland Kitchen.. 1 Cc - Injections Every Other Day 3)  Armour Thyroid 30 Mg  Tabs (Thyroid) .Marland Kitchen.. 1 By Mouth Once Daily 4)  Losartan Potassium 50 Mg Tabs (Losartan Potassium) .Marland Kitchen.. 1 By Mouth Once Daily 5)  Cpap Nightly 6)  Xopenex Concentrate 1.25 Mg/0.60ml Nebu (Levalbuterol Hcl) .... Use Asd Qid As Needed 7)  Ventolin Hfa 108 (90 Base) Mcg/act Aers (Albuterol Sulfate) .... 2 Puffs Four  Times Per Day As Needed 8)  Flector 1.3 % Ptch (Diclofenac Epolamine) .... Apply When Needed 9)  Tizanidine Hcl 4 Mg Tabs (Tizanidine Hcl) .... Use Breakthrough For Migraine 10)  Fentanyl 25 Mcg/hr Pt72 (Fentanyl) .Marland Kitchen.. 1 Patch Q 3 Days 11)  Topamax 100 Mg Tabs (Topiramate) .... Take 1 Two Tiimes A Day 12)  Lactulose 10 Gm/63ml Soln (Lactulose) .... As Needed For Constipation 13)  Voltaren 1 % Gel (Diclofenac Sodium) .... As Needed 14)  Climara 0.06 Mg/24hr Ptwk (Estradiol) .Marland Kitchen.. 1 Patch Q Week 15)  Vitamin D 1200mg  .... Once Daily 16)  Livalo 4 Mg Tabs (Pitavastatin Calcium) .Marland Kitchen.. 1 By Mouth Once Daily 17)  Pantoprazole Sodium 40 Mg Tbec (Pantoprazole Sodium) .Marland Kitchen.. 1 By Mouth Once Daily 18)  Toviaz 4 Mg Xr24h-Tab (Fesoterodine Fumarate) .Marland Kitchen.. 1 By Mouth Once Daily 19)  Miralax  Powd (Polyethylene Glycol 3350) .... Mix in 8 Ounces and Drink As Needed  Allergies (verified): 1)  ! Codeine 2)  ! * Ivp Dye 3)  ! Celebrex 4)  ! Erythromycin 5)  ! Vioxx 6)  ! * Zetia 7)  ! * Statins 8)  * Savella 9)  * Cymbalta 10)  * Lyrica  Past History:  Past Medical History: Reviewed history from 11/19/2009 and no changes required. Obesity IBS Chronic constipation Venous Insufficiency Peripheral Edema Fibromylagia Chronic Fatigue Allergic rhinitis GERD Osteoporosis Peptic ulcer disease - 80's H/o Sickle Cell Trait H/O Mitral Valve Prolapse Obstructive Sleep Apnea, moderate Depression Hypertension Hyperthyroidism Osteoarthritis Hyperlipidemia Low  back pain/lumbar disc disease/sciatica Asthma/chronic bronchitis per pt knee DJD COPD migraine Anxiety Hypothyroidism glucose intolerance Diverticulosis Hiatal Hernia Partial small bowel obstruction 05/2009  Past Surgical History: Reviewed history from 05/31/2007 and no changes required. Hysterectomy- 1983 Oophorectomy and benign tumor removal- 2001 Tubal ligation Tonsillectomy-1969 Breast Cyst removal - 2000 - left  Family  History: mother with breast cancer mets.- died 2007-10-10 sister with colon cancer dxed age 30 father and brother with CHF uncle with stroke neice & father with DM sister and brother  with sardoidosis Family History of Kidney Disease: father  Social History: Never Smoked Divorced Alcohol use-yes  socially 2 children disabled - fibromyalgia - per dr Corliss Skains Drug use-no Daily Caffeine Use 2-3 per day  Review of Systems       The patient complains of allergy/sinus, arthritis/joint pain, back pain, cough, depression-new, fatigue, headaches-new, sleeping problems, swelling of feet/legs, and urine leakage.         The pertinent positives and negatives are noted as above and in the HPI. All other ROS were reviewed and were negative.   Vital Signs:  Patient profile:   60 year old female Height:      64 inches Weight:      200.2 pounds BMI:     34.49 Pulse rate:   80 / minute Pulse rhythm:   regular BP sitting:   118 / 68  (left arm)  Vitals Entered By: [name]) (March 11, 2010 2:50 PM)  Physical Exam  General:  Well developed, well nourished, no acute distress. Obese.  obese.   Head:  Normocephalic and atraumatic. Eyes:  PERRLA, no icterus. Mouth:  No deformity or lesions, dentition normal. Lungs:  Clear throughout to auscultation. Heart:  Regular rate and rhythm; no murmurs, rubs,  or bruits. Abdomen:  Soft, nontender and nondistended. No masses, hepatosplenomegaly or hernias noted. Normal bowel sounds. Psych:  Alert and cooperative. Normal mood and affect.  Impression & Recommendations:  Problem # 1:  CONSTIPATION (ICD-564.00)  Chronic constipation not adequately controlled. I have advised her to use MiraLax once or twice per day on an ongoing basis to help manage and prevent constipation. She can use lactulose as needed.    Problem # 2:  IRRITABLE BOWEL SYNDROME (ICD-564.1) Her mild left-sided abdominal pain  is most likely secondary to  constipation  irritable bowel syndrome. resume Bentyl 3 times a day when necessary.  Celiac antibody profile.  Problem # 3:  PERSONAL HX COLON CANCER (ICD-V10.05) Sister with colon cancer at age 51. Surveillance colonoscopy due May 2014.  Other Orders: T-Sprue Panel (Celiac Disease Aby Eval) ([id number])  Patient Instructions: 1)  Please go directly to the basement to have your labs drawn.  2)  Take Miralax 17 grams in 8 oz of water daily. 3)  Please continue current medications.  4)  Copy sent to : Oliver Barre, MD 5)  The medication list was reviewed and reconciled.  All changed / newly prescribed medications were explained.  A complete medication list was provided to the patient / caregiver.  Prescriptions: BENTYL 10 MG CAPS (DICYCLOMINE HCL) one capsule by mouth three times a day  #30 x 11   Entered by:   Christie Nottingham CMA (AAMA)   Authorized by:   Meryl Dare MD [facility]   Signed by:   Meryl Dare MD [facility] on 03/11/2010   Method used:   Electronically to        [facility] [phone]* (retail)  [address]       [address]       [place], [place]  [address]       Ph: [phone]       Fax: [phone]   RxID:   [id number]

## 2010-03-28 ENCOUNTER — Ambulatory Visit (HOSPITAL_COMMUNITY): Payer: Medicare Other | Attending: Internal Medicine

## 2010-03-28 DIAGNOSIS — R609 Edema, unspecified: Secondary | ICD-10-CM

## 2010-03-28 DIAGNOSIS — M7989 Other specified soft tissue disorders: Secondary | ICD-10-CM | POA: Insufficient documentation

## 2010-03-28 DIAGNOSIS — R0989 Other specified symptoms and signs involving the circulatory and respiratory systems: Secondary | ICD-10-CM | POA: Insufficient documentation

## 2010-03-28 DIAGNOSIS — E785 Hyperlipidemia, unspecified: Secondary | ICD-10-CM | POA: Insufficient documentation

## 2010-03-28 DIAGNOSIS — R0609 Other forms of dyspnea: Secondary | ICD-10-CM | POA: Insufficient documentation

## 2010-03-28 DIAGNOSIS — I059 Rheumatic mitral valve disease, unspecified: Secondary | ICD-10-CM | POA: Insufficient documentation

## 2010-03-28 DIAGNOSIS — I10 Essential (primary) hypertension: Secondary | ICD-10-CM | POA: Insufficient documentation

## 2010-03-28 DIAGNOSIS — I079 Rheumatic tricuspid valve disease, unspecified: Secondary | ICD-10-CM | POA: Insufficient documentation

## 2010-03-30 ENCOUNTER — Encounter: Payer: Self-pay | Admitting: Internal Medicine

## 2010-03-30 ENCOUNTER — Ambulatory Visit (INDEPENDENT_AMBULATORY_CARE_PROVIDER_SITE_OTHER): Payer: Medicare Other | Admitting: Internal Medicine

## 2010-03-30 DIAGNOSIS — I10 Essential (primary) hypertension: Secondary | ICD-10-CM

## 2010-03-30 DIAGNOSIS — K1370 Unspecified lesions of oral mucosa: Secondary | ICD-10-CM | POA: Insufficient documentation

## 2010-03-30 DIAGNOSIS — R5381 Other malaise: Secondary | ICD-10-CM

## 2010-03-30 DIAGNOSIS — F329 Major depressive disorder, single episode, unspecified: Secondary | ICD-10-CM

## 2010-04-05 NOTE — Assessment & Plan Note (Signed)
Summary: one month fu/lb   Vital Signs:  Patient profile:   60 year old female Height:      64.5 inches Weight:      197.25 pounds BMI:     33.46 O2 Sat:      98 % on Room air Temp:     98.5 degrees F oral Pulse rate:   86 / minute BP sitting:   118 / 82  (left arm) Cuff size:   regular  Vitals Entered By: Zella Ball Ewing CMA Duncan Dull) (March 30, 2010 4:37 PM)  O2 Flow:  Room air CC: 2 week followup/RE   Primary Care Provider:  Oliver Barre, MD  CC:  2 week followup/RE.  History of Present Illness: here to f/u; overall doing about the same, still with similar compoaints as per last HPI with fatigue and exhaustion ;  Pt denies CP, worsening sob, doe, wheezing, orthopnea, pnd, worsening LE edema, palps, dizziness or syncope  Pt denies new neuro symptoms such as headache, facial or extremity weakness  Pt denies polydipsia, polyuria.  No fever, wt loss, night sweats, loss of appetite or other constitutional symptoms  Overall good compliance with meds, and good tolerability.  Still with signficant grief and depressive symtpoms, states she probably could benefit from counseling but does not want to pursue at this time.  Denies suicidal ideation, or panic.  Finds herself overwhelmed with activities, socially and for church.  Did see Dr Sharmon Leyden - as per EMR with w/u for IBS, not felt to need repeat colonscopy at this time.  Echo essentatily unrevealing and reviewed with pt today.  No HA, St, cough or dysuriq, freq, urgency.   Problems Prior to Update: 1)  Other&unspecified Diseases The Oral Soft Tissues  (ICD-528.9) 2)  Personal Hx Colon Cancer  (ICD-V10.05) 3)  Personal Hx Colon Cancer  (ICD-V10.05) 4)  Irritable Bowel Syndrome  (ICD-564.1) 5)  Constipation  (ICD-564.00) 6)  Personal Hx Colon Cancer  (ICD-V10.05) 7)  Dyspnea  (ICD-786.05) 8)  Frequency, Urinary  (ICD-788.41) 9)  Chest Pain  (ICD-786.50) 10)  Back Pain  (ICD-724.5) 11)  Thyroid Nodule, Right  (ICD-241.0) 12)  Fatigue   (ICD-780.79) 13)  Acute Pharyngitis  (ICD-462) 14)  Goiter  (ICD-240.9) 15)  Constipation  (ICD-564.00) 16)  Lumbar Radiculopathy, Left  (ICD-724.4) 17)  Family Hx Colon Cancer  (ICD-V16.0) 18)  Glucose Intolerance  (ICD-271.3) 19)  Hypothyroidism  (ICD-244.9) 20)  Anxiety  (ICD-300.00) 21)  Colonic Polyps, Hx of  (ICD-V12.72) 22)  COPD  (ICD-496) 23)  Asthma  (ICD-493.90) 24)  Uri  (ICD-465.9) 25)  Strep Throat  (icd-034.0) 26)  Hyperlipidemia  (ICD-272.4) 27)  Osteoarthritis  (ICD-715.90) 28)  Hyperthyroidism  (ICD-242.90) 29)  Hypertension  (ICD-401.9) 30)  Depression  (ICD-311) 31)  Sleep Apnea, Obstructive, Moderate  (ICD-327.23) 32)  Mitral Valve Prolapse, Hx of  (ICD-V12.50) 33)  Sickle Cell Trait  (ICD-282.5) 34)  Peptic Ulcer Disease  (ICD-533.90) 35)  Osteoporosis  (ICD-733.00) 36)  Gerd  (ICD-530.81) 37)  Allergic Rhinitis  (ICD-477.9) 38)  Fatigue, Chronic  (ICD-780.79) 39)  Fibromyalgia  (ICD-729.1) 40)  Peripheral Edema  (ICD-782.3) 41)  Venous Insufficiency  (ICD-459.81) 42)  Irritable Bowel Syndrome  (ICD-564.1) 43)  Obesity  (ICD-278.00)  Medications Prior to Update: 1)  Furosemide 40 Mg  Tabs (Furosemide) .Marland Kitchen.. 1po Once Daily As Needed For Swelling 2)  Vitamin B-12 Cr 3000 Mcg  Tbcr (Cyanocobalamin) .Marland Kitchen.. 1 Cc - Injections Every Other Day 3)  Armour Thyroid 30  Mg  Tabs (Thyroid) .Marland Kitchen.. 1 By Mouth Once Daily 4)  Losartan Potassium 50 Mg Tabs (Losartan Potassium) .Marland Kitchen.. 1 By Mouth Once Daily 5)  Cpap Nightly 6)  Xopenex Concentrate 1.25 Mg/0.74ml Nebu (Levalbuterol Hcl) .... Use Asd Qid As Needed 7)  Ventolin Hfa 108 (90 Base) Mcg/act Aers (Albuterol Sulfate) .... 2 Puffs Four Times Per Day As Needed 8)  Flector 1.3 % Ptch (Diclofenac Epolamine) .... Apply When Needed 9)  Tizanidine Hcl 4 Mg Tabs (Tizanidine Hcl) .... Use Breakthrough For Migraine 10)  Fentanyl 25 Mcg/hr Pt72 (Fentanyl) .Marland Kitchen.. 1 Patch Q 3 Days 11)  Topamax 100 Mg Tabs (Topiramate) .... Take 1 Two  Tiimes A Day 12)  Lactulose 10 Gm/52ml Soln (Lactulose) .... As Needed For Constipation 13)  Voltaren 1 % Gel (Diclofenac Sodium) .... As Needed 14)  Climara 0.06 Mg/24hr Ptwk (Estradiol) .Marland Kitchen.. 1 Patch Q Week 15)  Vitamin D 1200mg  .... Once Daily 16)  Livalo 4 Mg Tabs (Pitavastatin Calcium) .Marland Kitchen.. 1 By Mouth Once Daily 17)  Pantoprazole Sodium 40 Mg Tbec (Pantoprazole Sodium) .Marland Kitchen.. 1 By Mouth Once Daily 18)  Toviaz 4 Mg Xr24h-Tab (Fesoterodine Fumarate) .Marland Kitchen.. 1 By Mouth Once Daily 19)  Miralax  Powd (Polyethylene Glycol 3350) .... Mix in 8 Ounces and Drink As Needed 20)  Bentyl 10 Mg Caps (Dicyclomine Hcl) .... One Capsule By Mouth Three Times A Day  Current Medications (verified): 1)  Furosemide 40 Mg  Tabs (Furosemide) .Marland Kitchen.. 1po Once Daily As Needed For Swelling 2)  Vitamin B-12 Cr 3000 Mcg  Tbcr (Cyanocobalamin) .Marland Kitchen.. 1 Cc - Injections Every Other Day 3)  Armour Thyroid 30 Mg  Tabs (Thyroid) .Marland Kitchen.. 1 By Mouth Once Daily 4)  Losartan Potassium 50 Mg Tabs (Losartan Potassium) .Marland Kitchen.. 1 By Mouth Once Daily 5)  Cpap Nightly 6)  Xopenex Concentrate 1.25 Mg/0.20ml Nebu (Levalbuterol Hcl) .... Use Asd Qid As Needed 7)  Ventolin Hfa 108 (90 Base) Mcg/act Aers (Albuterol Sulfate) .... 2 Puffs Four Times Per Day As Needed 8)  Flector 1.3 % Ptch (Diclofenac Epolamine) .... Apply When Needed 9)  Tizanidine Hcl 4 Mg Tabs (Tizanidine Hcl) .... Use Breakthrough For Migraine 10)  Fentanyl 25 Mcg/hr Pt72 (Fentanyl) .Marland Kitchen.. 1 Patch Q 3 Days 11)  Topamax 100 Mg Tabs (Topiramate) .... Take 1 Two Tiimes A Day 12)  Lactulose 10 Gm/63ml Soln (Lactulose) .... As Needed For Constipation 13)  Voltaren 1 % Gel (Diclofenac Sodium) .... As Needed 14)  Climara 0.06 Mg/24hr Ptwk (Estradiol) .Marland Kitchen.. 1 Patch Q Week 15)  Vitamin D 1200mg  .... Once Daily 16)  Livalo 4 Mg Tabs (Pitavastatin Calcium) .Marland Kitchen.. 1 By Mouth Once Daily 17)  Pantoprazole Sodium 40 Mg Tbec (Pantoprazole Sodium) .Marland Kitchen.. 1 By Mouth Once Daily 18)  Toviaz 4 Mg Xr24h-Tab  (Fesoterodine Fumarate) .Marland Kitchen.. 1 By Mouth Once Daily 19)  Miralax  Powd (Polyethylene Glycol 3350) .... Mix in 8 Ounces and Drink As Needed 20)  Bentyl 10 Mg Caps (Dicyclomine Hcl) .... One Capsule By Mouth Three Times A Day 21)  Lexapro 10 Mg Tabs (Escitalopram Oxalate) .Marland Kitchen.. 1 By Mouth Once Daily  Allergies (verified): 1)  ! Codeine 2)  ! * Ivp Dye 3)  ! Celebrex 4)  ! Erythromycin 5)  ! Vioxx 6)  ! * Zetia 7)  ! * Statins 8)  * Savella 9)  * Cymbalta 10)  * Lyrica  Past History:  Past Medical History: Last updated: 11/19/2009 Obesity IBS Chronic constipation Venous Insufficiency Peripheral Edema Fibromylagia Chronic  Fatigue Allergic rhinitis GERD Osteoporosis Peptic ulcer disease - 80's H/o Sickle Cell Trait H/O Mitral Valve Prolapse Obstructive Sleep Apnea, moderate Depression Hypertension Hyperthyroidism Osteoarthritis Hyperlipidemia Low back pain/lumbar disc disease/sciatica Asthma/chronic bronchitis per pt knee DJD COPD migraine Anxiety Hypothyroidism glucose intolerance Diverticulosis Hiatal Hernia Partial small bowel obstruction 05/2009  Past Surgical History: Last updated: 05/31/2007 Hysterectomy- 1983 Oophorectomy and benign tumor removal- 2001 Tubal ligation Tonsillectomy-1969 Breast Cyst removal - 2000 - left  Social History: Last updated: 03/11/2010 Never Smoked Divorced Alcohol use-yes  socially 2 children disabled - fibromyalgia - per dr Corliss Skains Drug use-no Daily Caffeine Use 2-3 per day  Risk Factors: Smoking Status: never (05/01/2007)  Review of Systems       all otherwise negative per pt -  except with several small bumps to the palate and would like referral to oral surgeon for further evaluation  Physical Exam  General:  alert and overweight-appearing.   Head:  normocephalic and atraumatic.   Eyes:  vision grossly intact, pupils equal, and pupils round.   Ears:  R ear normal and L ear normal., sinus nontender Nose:   no external deformity and no nasal discharge.   Mouth:  good dentition and pharynx pink and moist.  ,several small 3 mm erythem lesions to hard palate noted, nontender, regular, no ulcers Neck:  supple and no masses.   Lungs:  normal respiratory effort, no intercostal retractions or use of accessory muscles; normal breath sounds bilaterally - no crackles and no wheezes.    Heart:  normal rate, regular rhythm, no murmur, and no rub. BLE without edema.  Abdomen:  soft, non-tender, and normal bowel sounds.   Extremities:  no edema, no erythema  Skin:  color normal and no rashes.   Psych:  dysphoric affect and moderately anxious.     Impression & Recommendations:  Problem # 1:  FATIGUE (ICD-780.79) exam benign, checked labs ordered last visit reviewed with pt; to follow with expectant management   Problem # 2:  OTHER&UNSPECIFIED DISEASES THE ORAL SOFT TISSUES (ICD-528.9) small erythem raised lesions - to refer to oral surgury, prob benign, but unclear etiology Orders: Misc. Referral (Misc. Ref)  Problem # 3:  DEPRESSION (ICD-311)  Her updated medication list for this problem includes:    Lexapro 10 Mg Tabs (Escitalopram oxalate) .Marland Kitchen... 1 by mouth once daily treat as above, f/u any worsening signs or symptoms  , declines counseling  Discussed treatment options, including trial of antidpressant medication.  Patient agrees to call if any worsening of symptoms or thoughts of doing harm arise. Verified that the patient has no suicidal ideation at this time.   Problem # 4:  HYPERTENSION (ICD-401.9)  Her updated medication list for this problem includes:    Furosemide 40 Mg Tabs (Furosemide) .Marland Kitchen... 1po once daily as needed for swelling    Losartan Potassium 50 Mg Tabs (Losartan potassium) .Marland Kitchen... 1 by mouth once daily  BP today: 118/82 Prior BP: 118/68 (03/11/2010)  Prior 10 Yr Risk Heart Disease: Not enough information (10/26/2008)  Labs Reviewed: K+: 4.3 (03/01/2010) Creat: : 0.9  (03/01/2010)   Chol: 262 (05/31/2007)   HDL: 54.1 (05/31/2007)   LDL: DEL (05/31/2007)   TG: 46 (05/31/2007) stable overall by hx and exam, ok to continue meds/tx as is   Complete Medication List: 1)  Furosemide 40 Mg Tabs (Furosemide) .Marland Kitchen.. 1po once daily as needed for swelling 2)  Vitamin B-12 Cr 3000 Mcg Tbcr (cyanocobalamin)  .Marland Kitchen.. 1 cc - injections every other day 3)  Armour Thyroid 30 Mg Tabs (Thyroid) .Marland Kitchen.. 1 by mouth once daily 4)  Losartan Potassium 50 Mg Tabs (Losartan potassium) .Marland Kitchen.. 1 by mouth once daily 5)  Cpap Nightly  6)  Xopenex Concentrate 1.25 Mg/0.49ml Nebu (Levalbuterol hcl) .... Use asd qid as needed 7)  Ventolin Hfa 108 (90 Base) Mcg/act Aers (Albuterol sulfate) .... 2 puffs four times per day as needed 8)  Flector 1.3 % Ptch (Diclofenac epolamine) .... Apply when needed 9)  Tizanidine Hcl 4 Mg Tabs (Tizanidine hcl) .... Use breakthrough for migraine 10)  Fentanyl 25 Mcg/hr Pt72 (Fentanyl) .Marland Kitchen.. 1 patch q 3 days 11)  Topamax 100 Mg Tabs (Topiramate) .... Take 1 two tiimes a day 12)  Lactulose 10 Gm/29ml Soln (Lactulose) .... As needed for constipation 13)  Voltaren 1 % Gel (Diclofenac sodium) .... As needed 14)  Climara 0.06 Mg/24hr Ptwk (Estradiol) .Marland Kitchen.. 1 patch q week 15)  Vitamin D 1200mg   .... Once daily 16)  Livalo 4 Mg Tabs (Pitavastatin calcium) .Marland Kitchen.. 1 by mouth once daily 17)  Pantoprazole Sodium 40 Mg Tbec (Pantoprazole sodium) .Marland Kitchen.. 1 by mouth once daily 18)  Toviaz 4 Mg Xr24h-tab (Fesoterodine fumarate) .Marland Kitchen.. 1 by mouth once daily 19)  Miralax Powd (Polyethylene glycol 3350) .... Mix in 8 ounces and drink as needed 20)  Bentyl 10 Mg Caps (Dicyclomine hcl) .... One capsule by mouth three times a day 21)  Lexapro 10 Mg Tabs (Escitalopram oxalate) .Marland Kitchen.. 1 by mouth once daily  Patient Instructions: 1)  Please take all new medications as prescribed 2)  Continue all previous medications as before this visit  3)  You are given your results today 4)  You will be  contacted about the referral(s) to: oral surgeon 5)  Please schedule a follow-up appointment as needed. Prescriptions: LEXAPRO 10 MG TABS (ESCITALOPRAM OXALATE) 1 by mouth once daily  #30 x 11   Entered and Authorized by:   Corwin Levins MD   Signed by:   Corwin Levins MD on 03/30/2010   Method used:   Print then Give to Patient   RxID:   [id number]    Orders Added: 1)  Misc. Referral [Misc. Ref] 2)  Est. Patient Level IV [64332]

## 2010-04-11 LAB — BASIC METABOLIC PANEL
BUN: 3 mg/dL — ABNORMAL LOW (ref 6–23)
Creatinine, Ser: 0.84 mg/dL (ref 0.4–1.2)
GFR calc non Af Amer: >60 mL/min (ref >60)

## 2010-04-12 LAB — COMPREHENSIVE METABOLIC PANEL
AST: 16 U/L (ref 0–37)
Albumin: 3.3 g/dL — ABNORMAL LOW (ref 3.5–5.2)
Alkaline Phosphatase: 42 U/L (ref 39–117)
Chloride: 106 mEq/L (ref 96–112)
Creatinine, Ser: 0.85 mg/dL (ref 0.4–1.2)
GFR calc Af Amer: >60 mL/min (ref >60)
Potassium: 3.4 mEq/L — ABNORMAL LOW (ref 3.5–5.1)
Total Bilirubin: 0.4 mg/dL (ref 0.3–1.2)

## 2010-04-12 LAB — TSH: TSH: 7.004 u[IU]/mL — ABNORMAL HIGH (ref 0.350–4.500)

## 2010-04-12 LAB — BASIC METABOLIC PANEL
Calcium: 8.1 mg/dL — ABNORMAL LOW (ref 8.4–10.5)
GFR calc Af Amer: >60 mL/min (ref >60)
GFR calc non Af Amer: >60 mL/min (ref >60)
Glucose, Bld: 97 mg/dL (ref 70–99)
Potassium: 3.4 mEq/L — ABNORMAL LOW (ref 3.5–5.1)
Sodium: 140 mEq/L (ref 135–145)

## 2010-04-12 LAB — CARDIAC PANEL(CRET KIN+CKTOT+MB+TROPI)
CK, MB: 1.2 ng/mL (ref 0.3–4.0)
Relative Index: INVALID (ref 0.0–2.5)
Relative Index: INVALID (ref 0.0–2.5)
Total CK: 55 U/L (ref 7–177)
Total CK: 72 U/L (ref 7–177)
Troponin I: 0.01 ng/mL (ref 0.00–0.06)
Troponin I: 0.01 ng/mL (ref 0.00–0.06)

## 2010-04-12 LAB — POCT CARDIAC MARKERS: CKMB, poc: <1 ng/mL — ABNORMAL LOW (ref 1.0–8.0)

## 2010-04-12 LAB — CBC
MCHC: 33 g/dL (ref 30.0–36.0)
Platelets: 232 10*3/uL (ref 150–400)
Platelets: 268 10*3/uL (ref 150–400)
RDW: 13.4 % (ref 11.5–15.5)
WBC: 8.7 10*3/uL (ref 4.0–10.5)

## 2010-04-12 LAB — DIFFERENTIAL
Basophils Absolute: 0 10*3/uL (ref 0.0–0.1)
Eosinophils Relative: 0 % (ref 0–5)
Lymphocytes Relative: 26 % (ref 12–46)
Monocytes Absolute: 0.5 10*3/uL (ref 0.1–1.0)
Monocytes Relative: 6 % (ref 3–12)

## 2010-04-12 LAB — LIPID PANEL
Cholesterol: 206 mg/dL — ABNORMAL HIGH (ref 0–200)
LDL Cholesterol: 135 mg/dL — ABNORMAL HIGH (ref 0–99)
Triglycerides: 103 mg/dL (ref <150)
VLDL: 21 mg/dL (ref 0–40)

## 2010-04-12 LAB — TROPONIN I: Troponin I: 0.01 ng/mL (ref 0.00–0.06)

## 2010-05-31 ENCOUNTER — Other Ambulatory Visit: Payer: Self-pay | Admitting: Internal Medicine

## 2010-05-31 DIAGNOSIS — N6315 Unspecified lump in the right breast, overlapping quadrants: Secondary | ICD-10-CM

## 2010-06-06 ENCOUNTER — Other Ambulatory Visit: Payer: Medicare Other

## 2010-06-07 NOTE — Discharge Summary (Signed)
[name], [name]              ACCOUNT NO.:  [id number]   MEDICAL RECORD NO.:  [id number]          PATIENT TYPE:  OBV   LOCATION:  1522                         FACILITY:  [facility]   PHYSICIAN:  [name], MDDATE OF BIRTH:  16-Sep-1950   DATE OF ADMISSION:  06/26/2007  DATE OF DISCHARGE:  06/29/2007                               DISCHARGE SUMMARY   DISCHARGE DIAGNOSES:  1. Right flank and back pain thought secondary to muscle strain.  2. Constipation.  3. Fibromyalgia.  4. Morbid obesity.   HISTORY OF PRESENT ILLNESS:  [name] is a 60 year old African  American female with past medical history of fibromyalgia,  hypothyroidism, depression, and chronic fatigue syndrome, who has  recently been followed for chronic constipation by Dr. Jonny Ruiz.  The  patient presented to the emergency room with a 3-day history of right  flank pain that first occurred while making the bed.  The patient  reports a moderate amount of pain at first.  However, pain progressively  worse, at which time she came to the emergency room for evaluation.  The  patient reports pain worse with deep inspiration and movement.  evaluation in the emergency room revealed abdomen soft, nontender,  nondistended, with tenderness over right portion of the ribs as well as  thoracic spine.  Pain noted to be worse with rotational movement of  trunk and lifting of arms.  The patient was admitted at that time for  further evaluation and treatment.   PAST MEDICAL HISTORY:  1. Fibromyalgia.  2. DDD.  3. Asthma.  4. Hyperthyroidism.  5. Depression  6. Chronic headaches.  7. Sleep apnea requiring CPAP.  8. Chronic fatigue syndrome.  9. Morbid obesity.   COURSE OF HOSPITALIZATION:  1. Right flank pain, right-sided back pain.  As mentioned in HPI, pain      thought likely secondary to musculoskeletal. However, due to the      patient's past medical history and physical exam, the patient was      admitted for  evaluation.  Chest x-ray done at time of admission      revealing cardiomegaly, without any evidence of cardiopulmonary      disease.  X-ray of the thoracic spine was negative for any acute      findings.  Due to an elevated D-dimer on admission, a V/Q scan was      obtained which revealed low probability for pulmonary embolism.      Metabolic cause of pain ruled out with negative V/Q scan, as well      as normal sed rate and normal CK levels.  Patient with no signs or      symptoms of infection on exam, with urinalysis negative for any      infection and with normal white cell count and afebrile throughout      hospitalization.  Patient to continue nonnarcotic treatment at time      of discharge for flank pain.  2. Chronic constipation.  The patient has seen primary care physician      several times of late due to complaints of constipation.  KUB done      during this hospitalization revealed nonspecific nonobstructive      bowel gas pattern, with moderate to large amount of colonic stool.      Prior to this admission, the patient has been scheduled to see Dr.      Judie Petit start on July 01, 2007.  She is to keep this appointment as      scheduled.  Patient placed on Colace b.i.d. in addition to Senokot      tablets and MiraLax regimen.  Again, there is no evidence of ileus,      with a benign abdominal exam, and with no nausea or vomiting during      this hospitalization.  3. Morbid obesity.  Patient currently being seen at weight loss      clinic.  She states she has been phentermine for several months.      However, physician at weight loss clinic recently changed her to      [place].  These medications can certainly cause the patient's      constipation, and it was recommended that the patient discontinue      these medications until followup with gastroenterology.   PERTINENT LABORATORY WORK DURING HOSPITALIZATION:  White cell count 8.7,  platelet count 274, hemoglobin 14.8,  hematocrit 44.5.  Sodium 141,  potassium 4.2, BUN 12, creatinine 0.86.  Liver function tests within  normal limits.  D-dimer 1.42.  Total CK 124.  ESR is 13.  Hemoglobin A1c  5.4.  TSH 3.615.   DISPOSITION:  Patient felt medically stable for discharge home.  She is  instructed to follow up with Dr. Russella Dar as scheduled prior to this  admission.  In addition, she can follow up with Dr. Oliver Barre, her  primary care physician, as needed.   Greater than 30 minutes were spent on discharge planning.      [name], NP      Raenette Rover. Felicity Coyer, MD  Electronically Signed    LE/MEDQ  D:  06/28/2007  T:  06/28/2007  Job:  [id number]   cc:   Venita Lick. Russella Dar, MD, FACG  [address]  [place]  [place] [address]   Corwin Levins, MD  [address]  [place]  [place] [address]

## 2010-06-07 NOTE — Assessment & Plan Note (Signed)
[facility]                         GASTROENTEROLOGY OFFICE NOTE   Margaret, Parker                     MRN:          [id number]  DATE:05/15/2007                            DOB:          June 24, 1950    PROBLEM:  Abdominal pain.   HISTORY:  [name] is a 60 year old female known to Dr. Russella Dar who was  last seen here in January, 2008.  She has a history of diverticulosis,  hiatal hernia, irritable bowel syndrome, and fairly chronic diarrhea.  She also carries diagnoses of fibromyalgia, degenerative disk disease,  asthma, hypothyroidism, depression, chronic headaches, sleep apnea,  chronic fatigue syndrome.  She is status post hysterectomy and has had a  breast cyst removed.  She has been treated in the past with Robinul  Forte and has been on an antireflux regimen and Nexium daily.   At this time, she comes in for more acute abdominal pain, which she said  started on April 27, 2007, located in her mid abdomen.  It was fairly  intense.  She was seen in the emergency room by the ER staff and told  that she was constipated.  She was given a prescription for Vicodin and  then also told to take MiraLax.  She took the MiraLax without much  benefit and then also took a colon cleanser, but feels that nothing has  helped her symptoms.  Since that time, she has been having some very  small bowel movements but does not feel that she has adequately  evacuated her bowels.  She got sick after that episode and with this  sore throat/upper respiratory infection, had to take a Z-Pak, but she  says she has remained constipated over these past several weeks.  I  believe she went to an urgent care this past weekend.  Had abdominal  films done that did show a lot of stool in the colon, and her MiraLax  was increased to three times daily.  She also took mag citrate and a  Fleet enema.  Yesterday, she said she did have a bowel movement, but  normally she goes 3-4 times a  day and is concerned.  She also has an  increase in esophagitis-type symptoms.   CURRENT MEDICATIONS:  1. Lexapro 10 mg nightly.  2. Phentermine, which she discontinued.   She has been holding her Topamax, Nexium, vitamin B12, furosemide,  calcium, and glucosamine because she was concerned they may have been  making her constipated.   ALLERGIES/INTOLERANCES:  CODEINE, CELEBREX, VIOXX, ERYTHROMYCIN, X-RAY  DYE, STATINS, ZETIA.   FAMILY HISTORY:  The patient's mother is currently quite ill, in the  final stages of breast cancer.  The patient has had increased stress  because of her mom's illness, and apparently she is expected to pass  within the next several weeks.  She also has a sister who was diagnosed  with colon cancer at age 25.   PHYSICAL EXAMINATION:  A well-developed African-American female in no  acute distress.  Weight is 246.  Blood pressure 130/86.  Pulse in the 70s.  HEENT:  Normocephalic and atraumatic.  EOMI.  PERRLA.  CARDIOVASCULAR:  Regular rate and rhythm with S1 and S2.  PULMONARY:  Clear to A&P.  ABDOMEN:  Soft.  She is mildly tender in the left mid quadrant.  There  is no guarding or rebound.  No palpable mass or hepatosplenomegaly.  RECTAL:  Hemoccult negative with formed stool in the rectal vault.   IMPRESSION:  86. A 60 year old African-American female with left mid quadrant      abdominal pain and constipation x1 month.  Etiology is not clear.      The patient does have a history of irritable bowel syndrome, but      this has in the past been more diarrhea-predominant.  This could be      a manifestation of her irritable bowel syndrome.  2. Positive family history of colon cancer:  Patient last underwent      colonoscopy in May, 2002.  Had mild left-sided diverticular disease      and one diminutive polyp.  3. History of irritable bowel syndrome.  4. Gastroesophageal reflux disease.  5. Fibromyalgia.  6. Asthma.   PLAN:  1. Continue MiraLax 17 gm  in 8 ounces of water every day.  2. One bottle of mag citrate this evening.  3. Schedule colonoscopy with Dr. Russella Dar.      Mike Gip, PA-C  Electronically Signed      [name], MD  Electronically Signed   AE/MedQ  DD: 05/22/2007  DT: 05/22/2007  Job #: [id number]   cc:   Venita Lick. Russella Dar, MD, Clementeen Graham

## 2010-06-07 NOTE — H&P (Signed)
NAMEJARELYN, [name]              ACCOUNT NO.:  [id number]   MEDICAL RECORD NO.:  [id number]          PATIENT TYPE:  INP   LOCATION:  0104                         FACILITY:  [facility]   PHYSICIAN:  Michiel Cowboy, MDDATE OF BIRTH:  Jul 12, 1950   DATE OF ADMISSION:  06/26/2007  DATE OF DISCHARGE:                              HISTORY & PHYSICAL   PRIMARY CARE Kevan Prouty:  Dr. Jonny Ruiz.   CHIEF COMPLAINT:  Right flank pain.   HISTORY OF THE PRESENT ILLNESS:  The patient is a 60 year old female  with a history of chronic constipation and diverticulosis followed by  Dr. Jonny Ruiz as well as chronic abdominal pain and is on the waiting list to  see Dr. Russella Dar with an appointment on July 01, 2007 for follow up of this  complaint.  The patient reports that at about three days or so she  developed right flank pain while she was making her bed.  At first it  hurt a little bit, but then the pain continued to worsen over the  weekend.  The patient states that the pain gets particularly worse when  she turns around or takes a deep breath.  The patient is very concerned  about and has been in the office a number of time at which point they  went ahead and asked her to come the emergency room for further  evaluation.   REVIEW OF SYSTEMS:  The review of systems is significant for  constipation, but the patient did have a bowel movement today.  It is  also significant for nausea, which is chronic for the patient, but she  did vomit today, which is new.  Otherwise she has no chest pain, no  lower extremity edema and no shortness of breath.   PAST MEDICAL HISTORY:  The past medical history includes:  1. Fibromyalgia.  2. Degenerative disk disease.  3. Asthma.  4. Hyperthyroidism.  5. Depression.  6. Chronic headaches.  7. Sleep apnea requiring CPAP.  8. Chronic fatigue syndrome.   MEDICATIONS:  The current medications include:  1. Lasix 40 mg by mouth daily.  2. Topamax 50 mg by mouth daily for  headaches.  3. Nexium 40 mg by mouth daily.  4. Albuterol inhaler as needed.  5. The patient used to take Lexapro 10 by mouth daily.   ALLERGIES:  The patient is allergic to CODEINE, ERYTHROMYCIN, CELEBREX,  VIOXX, CONTRAST, ZETIA, and STATINS.   SOCIAL HISTORY:  The patient does not smoke, use drugs and does not  drink.   FAMILY HISTORY:  The family history is noncontributory.   PHYSICAL EXAMINATION:  VITAL SIGNS:  Temperature 98.1, respirations 20,  heart rate 88, blood pressure 123/68, and sating 94% on room air.  GENERAL APPEARANCE:  The patient appears to be in no acute distress.  This is an obese female who is sitting on a stretcher.  HEART:  The heart has a regular rate and rhythm.  No murmurs, rubs or  gallops.  LUNGS:  The lungs are clear to auscultation bilaterally, but distant.  EXTREMITIES:  The lower extremities are without edema.  ABDOMEN:  The abdomen is soft, nondistended and obese.  There is,  however, tenderness over the right portion of the ribs as well as over  the thoracic spine.  There is worsening of pain and tenderness when the  patient does a rotational movement of her trunk or lifts up her arms.  NEUROLOGIC EXAMINATION:  Neurologically intact.   LABORATORY DATA:  White blood cell count 8.7, hemoglobin 14.8 and  platelets 274,000. Potassium 4.2, sodium 141 and creatinine 0.86.  LFTs  are within normal limits.  UA is within normal limits.   No imaging studies were obtained by the emergency department.   ASSESSMENT AND PLAN:  This is a 60 year old female with chronic  abdominal pain, fibromyalgia and chronic fatigue syndrome who basically  presents with new right flank pain and back pain.   1. Right flank pain with back pain.  The patient states that the pain      is severe in nature.  She requires further workup and pain      medication in order to control her symptoms.  We will admit her for      outpatient observation.  We will evaluate the flank  pain; however,      likely this is musculoskeletal in origin given thoracic spine.  We      will obtain thoracic spine films. The pain seems to be radicular in      nature.  We will treat with Elavil, one dose of Valium and Ultram      to see if this helps her symptoms.  2. Chronic constipation and abdominal pain.  We will obtain a KUB, and      give Coreg plus MiraLax.  The patient does state that her pain is      worse on taking deep breaths, which makes it sound pleuritic in      nature, but I think that this is secondary to possibly a pulled      muscle versus radicular pain given that it worsens with breathing.      We will obtain a D-dimer.  The patient is at risk given her      obesity.  We will also obtain a chest x-ray.  3. Asthma.  Continue albuterol as needed plus Flovent.  4. History of headaches.  Continue Topamax, check level.  5. History of obstructive sleep apnea.  Continue continuous positive      airway pressure.  6. History of hyperthyroidism. We will check the thyroid stimulating      hormone.  7. History of possible depression.  The patient used to be on Lexapro;      we will restart if this can help her pain.  8. Prophylaxis.  Lovenox and Protonix.   Dr. Felicity Coyer will assume the patient's care in the morning.      Michiel Cowboy, MD  Electronically Signed     AVD/MEDQ  D:  06/27/2007  T:  06/27/2007  Job:  [id number]

## 2010-06-09 ENCOUNTER — Ambulatory Visit
Admission: RE | Admit: 2010-06-09 | Discharge: 2010-06-09 | Disposition: A | Discharge status: Home / Self Care | Payer: Medicare Other | Source: Ambulatory Visit | Attending: Internal Medicine | Admitting: Internal Medicine

## 2010-06-10 NOTE — Discharge Summary (Signed)
[facility]  Patient:    [name], [name]                       MRN: [id number] Adm. Date:  09/03/00 Disc. Date: 09/06/00 Attending:  Rande Brunt. Eda Paschal, M.D.                           Discharge Summary  REASON FOR ADMISSION:         The patient is a 60 year old female was who admitted to the hospital with persistent complex right ovarian cyst, and chronic right lower quadrant pain for definitive surgery.  HOSPITAL COURSE:              On the day that she came to the hospital she underwent a diagnostic laparoscopy. Findings at that time were some endometriosis seen of the fallopian tube, adherence of the ovary and tube to the broad ligament, and an ovarian cyst. She underwent a laparoscopic right salpingo-oophorectomy. Postoperatively, she had significant pain and as a result of that had to be maintained on morphine and therefore could not be discharged after observation as had been originally planned. Then the patient began to run a temperature to 101 associated with severe headache and nausea. CBC and UA were done because of the above and were both normal. Chest x-ray showed bilateral atelectasis. Incentive spirometry was continued. Phenergan was given for the nausea and by the third postoperative day, the temperature had defervesced, the nausea was gone and she was ready for discharge.  DISCHARGE MEDICATIONS: 1. Climara patch once a week, 0.05 mg, for hormone replacement. 2. Darvocet-N 100 for pain relief.  DISCHARGE INSTRUCTIONS:  Activity: Normal. Diet: Regular. Wound care: Routine.  DISCHARGE FOLLOWUP:  The patient is to return to our office in two weeks for followup.  FINAL PATHOLOGY REPORT:  Benign ovary with theca-lutein cyst. Endometriosis seen grossly could not be identified microscopically at that time.  CONDITION ON DISCHARGE:   Improved.  DISCHARGE DIAGNOSES: 1. Pelvic pain. 2. Persistent right ovarian cyst, theca-lutein cyst on  pathology. 3. Gross endometriosis, not seen on microscopic. 4. Pelvic adhesive disease. DD:  09/06/00 TD:  09/06/00 Job: [id number]

## 2010-06-10 NOTE — Assessment & Plan Note (Signed)
[facility]                              CARDIOLOGY OFFICE NOTE   ANNELIES, COYT                     MRN:          [id number]  DATE:09/07/2005                            DOB:          Apr 08, 1950    Ms. Adami comes in today, moving slowly, complaining of significant  fibromyalgia pain, as well as shoulder and neck pain for which she has an  appointment later today to see a neurologist.  She continues to take Zetia  10 mg daily, which was started in February, and tolerates it well.  She  admits to occasional skipping Zetia doses on days when she has significant  pain, and is taking multiple other pain medicines, for fear of nausea.  Secondary to the significant pain she has had over the last few weeks, she  has been unable to get up and exercise much at all, spends a lot of time in  bed.  She has also been unable to cook as much as she would like to, and  ends up eating take-out food most days of the week.  She also continues to  eat one large meal a day with late night snacks.   PHYSICAL EXAMINATION:  She is 252 pounds, blood pressure is 140/100, heart  rate is 86.   LABORATORY DATA:  Total cholesterol 169, triglycerides 75, HDL 40.1, LDL  114.  LFTs are within normal limits.   ASSESSMENT:  Ms. Lobue triglycerides continue to be a goal.  Her HDL is  at goal of greater than 40.  LDL is slightly up from last night and not at  goal of less than 100.  She is tolerating her current medicine for high  cholesterol.  She is preoccupied with this neck and shoulder pain which is  really causing her a lot of trouble.   PLAN:  We are going to continue with the same medications.  Hopefully her  pain problems can be better controlled in the future, allowing her to eat  better and exercise on a more regular basis.  We are going to follow up with  her in four months.  Samples of Zetia were given, and she was urged to call  with any problems in the  meantime.                                 Charolotte Eke, PharmD    TP/MedQ  DD:  09/07/2005  DT:  09/07/2005  Job #:  [id number]

## 2010-06-10 NOTE — Discharge Summary (Signed)
NAMEGEORGA, [name]              ACCOUNT NO.:  [id number]   MEDICAL RECORD NO.:  [id number]          PATIENT TYPE:  INP   LOCATION:  5712                         FACILITY:  [facility]   PHYSICIAN:  Lonzo Cloud. Kriste Basque, M.D. [facility] OF BIRTH:  1950/12/07   DATE OF ADMISSION:  04/17/2004  DATE OF DISCHARGE:  04/22/2004                                 DISCHARGE SUMMARY   FINAL DIAGNOSES:  1.  Admitted on April 17, 2004 via the emergency room with asthma      exacerbation and right lower lobe infiltrate--no organism specified.      The patient responded nicely to Solu-Medrol, Rocephin, and Zithromax.  2.  Underlying asthma and asthmatic bronchitis.  3.  History of severe fibromyalgia/chronic fatigue syndrome and myalgic      encephalitis with previous evaluation and therapy by Dr. Silvana Newness and [facility]      in [place], [place].  4.  History of degenerative arthritis with low back pain and degenerative      disk disease in the cervical spine with previous evaluation by Dr.      Farris Has of orthopedics.  5.  Newly diagnosed obstructive sleep apnea with positive sleep study.  6.  History of hiatus hernia and gastroesophageal reflux disease.  7.  History of irritable bowel syndrome and diverticulosis and a colon polyp      on colonoscopy in 2002.  Her last colonoscopy in April 2005 was negative      except for diverticulosis.  8.  History of hypercholesterolemia--intolerant to statins.  9.  History of obesity--the patient has struggled with diet therapy.  10. History of headaches with previous evaluation by Dr. Meryl Crutch and Neale Burly      in the past.  The patient is currently on Topamax.  11. History of anxiety and depression.   HISTORY OF PRESENT ILLNESS:  The patient is a 60 year old black female with  a history of asthma and severe fibromyalgia.  She presented to the emergency  room on April 17, 2004 with a two-day history of feeling poorly with cough,  chest congestion, and thick green sputum  production.  She states she had a  temperature up to 102-plus at home with chills.  She noted increasing chest  discomfort and pain secondary to her cough.  She noted increasing shortness  of breath which got progressively worse on the day of admission and  presented to the emergency room for evaluation.  In the emergency room, she  was seen by Dr. Ethelda Chick of the emergency room staff.  He found a  temperature at 102.9 degrees orally; chest exam with wheezing, tightness and  congestion; chest x-ray showed an early right lower lobe infiltrate although  her white count was only 7900.  She was referred for admission for asthma  exacerbation and right lower lobe pneumonia.   PAST MEDICAL HISTORY:  As noted, she has a history of asthma and asthmatic  bronchitis.  She has been on Advair 500 mcg 1 puff b.i.d. and Ventolin spray  as needed.  She also has Tussionex 5 mL p.o. q.12h. p.r.n. cough at home.  She has a history of allergies.  As noted, she has a history of severe  fibromyalgia, chronic fatigue syndrome, and myalgic encephalitis followed  by Dr. Silvana Newness and [facility] at the [place] clinic.  She is on numerous  medications in the past.  She was on Duragesic previously but states she  stopped this about a year ago.  She indicated that Dr. Vedia Coffer can no longer  see her because she cannot afford the cash payments required in their  clinic.  She does have Lidoderm patches, Skelaxin, and Tramadol that she  takes per their direction.  She has also been on B12 shots and vitamins.  She has a history of degenerative arthritis with bilateral knee pain greater  on the right than the left.  She states Dr. Farris Has, the orthopedist has told  her she needs total knee replacements.  She has a history of low back pain  and degenerative disk disease in her cervical spine area as well.  She has a  history of obstructive sleep apnea recently diagnosed on a sleep study.  She  had mild to moderate disease with a  respiratory disturbance index of 23 and  a desaturation down to 81% with loud snoring.  She has not yet been started  on any therapy for this.  She has a history of ovarian cyst and  endometriosis and is followed by Dr. Eda Paschal for gynecology.  She has a  history of a hiatus hernia and gastroesophageal reflux disease.  She has a  history of irritable bowel syndrome, diverticulosis, and colon polyps.  Her  last polyp removed was in 2002.  She had a colonoscopy by Dr. Kinnie Scales in  April 2005 which was negative except for diverticulosis.  She has a history  of hypercholesterolemia that she chooses to control with diet alone.  She  has been intolerant to statins in the past.  She has moderate obesity and  has struggled with diet for years.  She has a history of headaches and with  a previous evaluation with Dr. Meryl Crutch and Dr. Neale Burly, is currently taking  Topamax for this.  She has a history of anxiety and depression as well.  She  had a CT abdomen in 2002 that showed some diverticulosis and a previous  hysterectomy and left oophorectomy.  She had a negative Cardiolite in 2003  without ischemia and ejection fraction was 60%.   PHYSICAL EXAMINATION:  On physical examination, this is a 60 year old black  female, chronically ill appearing in no acute distress.  Blood pressure  100/50, pulse 110 per minute and regular, respirations 22 per minute with an  O2 saturation of 92% on room air.  Temperature was 102.9 degrees orally in  the emergency room.  HEENT exam was unremarkable.  Neck exam revealed no  jugular venous distention, no carotid bruits, no thyromegaly or  lymphadenopathy.  Respiratory exam revealed expiratory wheezing bilaterally  with a few rhonchi at the right base but no signs of consolidation.  Cardiac  exam revealed a very tender chest wall on palpation.  There was a regular  rhythm, grade 1/6 systolic ejection murmur of the sternal border, no rubs or gallops heard.   Gastrointestinal exam revealed a soft abdomen, minimal  epigastric discomfort on palpation.  Intact bowel sounds, no evidence of  organosplenomegaly.  Extremities showed diffuse trigger points on palpation.  History of mild degenerative arthritis in the knees.  She had some venous  insufficiency and trace edema in the legs.  Neurologic exam  was intact  without focal abnormality detected.  Skin exam was negative.   LABORATORY DATA:  EKG showed a normal sinus rhythm and nonspecific ST-T wave  changes.  There were no acute abnormalities seen.  Chest x-ray showed heart  at the upper limits of normal in size.  There was question of an early right  lower lobe infiltrate.  Followup films showed some right lower lobe  atelectasis.  The left lung was clear.  Hemoglobin 13.4, hematocrit 38.0,  white count 7900, with 86% segs.  Sed rate 25.  Protime 14.0, INR 1.1, PTT  39 seconds.  Sodium 141, potassium 3.6, chloride 115, CO2 23, BUN 8,  creatinine 0.8, blood sugar 110.  Calcium 7.6, total protein 5.7, albumin  2.6, AST 21, ALT 14, alkaline phosphatase 52, total bilirubin 0.4.  Hemoglobin A1C 4.9.  CPK 92 with negative MB and negative troponin.  TSH  0.57.  B12 level 709.  Blood cultures negative x2.  She cannot produce any  sputum for analysis.   HOSPITAL COURSE:  The patient was admitted with suspicion of right lower  lobe pneumonia and an asthma exacerbation.  She was started on IV Solu-  Medrol and given IV Rocephin and Zithromax.  She was given hand-held  nebulizer treatments with Xopenex, Mucinex as a mucolytic agent, and  continued on her home medications including Tussionex, Nexium, and Topamax.  She responded nicely to this regimen.  We were able to wean the Solu-Medrol  in favor of oral prednisone and weaned the IV antibiotics in favor of oral  Zithromax.  She received a full course of antibiotics while in the hospital.  Her chest exam improved and she ambulated on the ward without  hypoxemia.  She was felt to be ready for discharge on April 22, 2004   MEDICATIONS AT DISCHARGE:  1.  Prednisone 20 mg p.o. q.a.m. for four days and then 1/2 tablet daily      until return.  2.  Mucinex 600 mg tablets, 1 tablet p.o. b.i.d. with plenty of fluids.  3.  Advair 500/50 mcg 1 puff b.i.d.  4.  Ventolin metered dose inhaler 2 puffs q.4h. p.r.n. wheezing.  5.  Tussionex 5 mL p.o. q.12h. p.r.n. severe cough.  6.  Nexium 40 mg p.o. daily.  7.  Topamax 25 mg tablets 3 tablets p.o. q.h.s. as before.  8.  Skelaxin 800 mg p.o. t.i.d. as needed for muscle spasm.  9.  Tramadol 50 mg p.o. q.4h. as needed for pain.  10. Tylenol 2 tablets p.o. q.4h. as needed for pain.   DISCHARGE DIET:  She was instructed on a low-sodium, calorie restricted  diet, encouraged to exercise and get her weight down.  She will rest at home  until a followup appointment which will be Friday, April 29, 2004 at 12 noon.   CONDITION AT DISCHARGE:  Improved.     SMN/MEDQ  D:  04/22/2004  T:  04/22/2004  Job:  [id number]

## 2010-06-10 NOTE — Op Note (Signed)
[facility]  Patient:    [name], [name]                       MRN: [id number] Proc. Date: 09/03/00 Attending:  Rande Brunt. Eda Paschal, M.D.                           Operative Report  PREOPERATIVE DIAGNOSES:  Right lower quadrant pain with complex right ovarian cyst, endometriosis suspected.  POSTOPERATIVE DIAGNOSES:  Endometriosis and pelvic adhesive disease of right ovary and tube.  OPERATION PERFORMED:  Open laparoscopy with right salpingo-oophorectomy.  SURGEON:  Daniel L. Eda Paschal, M.D.  FIRST ASSISTANT:  Douglass Rivers, M.D.  INDICATIONS FOR PROCEDURE:  The patient is a 60 year old female who is postmenopausal and has been complaining of right lower quadrant pain. On ultrasound, she had a complex mass involving her right ovary. She has had recurrent right ovarian cysts and has had to undergo repetitive ultrasound over the last five or six years. She has previously been diagnosed as having endometriosis and has undergone vaginal hysterectomy and then laparoscopy with left salpingo-oophorectomy for the above. She now enters the hospital for laparoscopy with right salpingo-oophorectomy for treatment of the above.  FINDINGS:  At the time of laparoscopy, the patients right ovary and tube were adherent to the broad ligament by adhesive disease. There was an area of endometriosis clearly seen on her fallopian tube. The ovary was enlarged consistent with a complex cyst but as the surgery progressed, it appeared to be a benign cyst and not a malignant cyst. There was also some pigmented endometriosis on the surface of the ovary.  DESCRIPTION OF PROCEDURE:  After adequate general endotracheal anesthesia, the patient was placed in the dorsal lithotomy position and prepped and draped in the usual sterile manner. A Foley catheter was inserted in the patients bladder and a vaginal sponge was placed in the vagina. Because of her two previous laparoscopies,  it was felt that an open laparoscopy should be performed. In addition, the patient has gained significant weight since her last procedure and it was also felt that would be safer. A midline incision was made below the umbilicus, carried down to the fascia, the fascia was tagged with #0 Vicryl in a pursestring fashion so as to get a good seal and then the peritoneum was entered and the Hasson catheter was placed and tied in place. The operating laparoscope was placed. It was attached to the camera so that both the surgeon and the first assistant could see. Two additional 5 mm ports were placed in the pelvis. Copious irrigation was done with Ringers lactate and perineal washings were obtained. The adnexa was then elevated, the right ureter was identified. Because of the adhesive disease and the endometriosis, it was elected to remove the ovary. The tripolar was utilized for some of the procedure. Bipolar was utilized for some of the procedure and ______ was attached and the unipolar coagulation was used for a small portion of the procedure. The IP ligament was bipolared and cut, the round ligaments bipolared and cut. The anterior attachments of the adnexa to the broad ligament were freed up. Posteriorly the ovary was densely adherent. It could be elevated, kept far away from the ureter and then it was excised in the peritoneum to completely remove the right adnexa. There were several little areas of oozing on the peritoneal edge which were controlled with bipolar coagulation. The ovary was then removed  with the tube and sent to pathology for tissue diagnosis. All cul-de-sac fluid was removed. The fascial incision was closed with figure-of-eights of #0 Vicryl and all the skin incisions were closed with staples. The patients Foley was draining clear urine at the termination of the procedure and it was removed. The skin incisions were injected with 0.25% Marcaine. The patient left the operating  room in satisfactory condition. Estimated blood loss was less than 50 cc with none replaced. The patient tolerated the procedure well and left the operating room in satisfactory condition. DD:  09/03/00 TD:  09/03/00 Job: [id number]

## 2010-06-10 NOTE — Op Note (Signed)
[place]  Patient:    [name], [name]                     MRN: [id number] Proc. Date: 09/09/99 Adm. Date:  [id number] Disc. Date: [id number] Attending:  Gennie Alma CC:         Rande Brunt. Eda Paschal, M.D.   Operative Report  [facility] [place] NUMBER:  [id number]  PREOPERATIVE DIAGNOSIS:  Dominant nodule of left breast, lower outer quadrant.  POSTOPERATIVE DIAGNOSIS:  Dominant nodule of left breast, lower outer quadrant.  OPERATION:  Left breast biopsy.  SURGEON:  Milus Mallick, M.D.  ANESTHESIA:  Local infiltration with 1% Xylocaine - 15 cc monitored anesthesia care.  DESCRIPTION OF PROCEDURE:  Under adequate perioperative intravenous sedation the patients left breast was prepped and draped in the usual fashion.  The patient had a very difficult to palpate nodule of the left breast in the lower outer quadrant that was measured in our office to be 7 cm from the nipple along the 3:30 radial.  That area was marked in the operating room and the area was infiltrated with 1% Xylocaine.  A radial incision was made along the 3:30 radial overlying this area and carried down to the breast tissue.  A generous breast biopsy was done of the area.  There was no discrete mass that was encountered, but the breast tissue was not hypnogenous.  It was sent for routine pathologic study.  Bleeders were electrocoagulated.  The subcuticular layer was reapproximated with a continuous suture of 5-0 Vicryl, 1/2 inch Steri-Strips were applied to the skin, and sterile dressing was applied. Estimated blood loss for the procedure was less than 50 cc.  The patient tolerated the procedure well and left the operating room in satisfactory condition. DD:  09/09/99 TD:  09/11/99 Job: [id number]

## 2010-06-10 NOTE — Assessment & Plan Note (Signed)
[facility]                              CARDIOLOGY OFFICE NOTE   Margaret, Parker                     MRN:          [id number]  DATE:08/15/2005                            DOB:          06-10-1950   [name] returns today for further management of her nonspecific chest  discomfort. Please see my note from February 20, 2005.   Again, she is a very pleasant lady who has suffered since the mid 90's from  fibromyalgia. She has a lot of chronic pain. Dr. Kriste Basque is trying to get her  involved in a chronic pain clinic.   At the time we saw her in January, we obtained a stress-Myoview on February 21, 2005.  She had an EF of 61% with normal perfusion, no scar ischemia.   Her discomfort now is throbbing and is mostly in her right arm. It is  clearly not exertion related.   EXAMINATION TODAY:  VITAL SIGNS:  Her blood pressure is 132/82, her pulse is  81 and regular, weight is 256. She is in no acute distress.  SKIN:  Warm and dry.  NECK:  Carotids are equal bilaterally without bruits.  LUNGS:  Clear. There is no rub, there are no adventitial sounds.  HEART:  Reveals a normal S1, S2 without murmur, rub or gallop.  EXTREMITIES:  She has good pulses in her upper extremities. Lower extremity  have no obvious abnormality.   EKG today is completely normal.   I had at least a 20 minute discussion with [name] about her chronic  pain. She tells me that Dr. Remigio Eisenmenger of neurosurgery says she may have a  cervical disk.  Perhaps this is some of her right arm pain.  I do not think  this is cardiac and reviewed the signs and symptoms of ischemic heart  disease and also reviewed the findings of her stress-Myoview.   At this point in time I recommend chronic pain management as Dr. Kriste Basque has  [facility]iated [facility]. In addition I have recommended  she see Dr. Remigio Eisenmenger to perhaps  see if surgical intervention would help some of the right arm  discomfort and  neck pain. She is certainly at this point in time cleared from a  cardiovascular stand-point for surgery.                               Thomas C. Daleen Squibb, MD, [facility]   TCW/MedQ  DD:  08/15/2005  DT:  08/15/2005  Job #:  [id number]   cc:   Lonzo Cloud. Kriste Basque, MD  DR. CRAMER, NEUROSURGERY
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
## 2010-06-10 NOTE — Assessment & Plan Note (Signed)
[facility]                         GASTROENTEROLOGY OFFICE NOTE   KENNIS, BUELL                     MRN:          [id number]  DATE:02/22/2006                            DOB:          12-Apr-1950    REFERRING PHYSICIAN:  Lonzo Cloud. Kriste Basque, MD   OFFICE CONSULTATION:   REASON FOR REFERRAL:  Left sided abdominal pain.  Chronic diarrhea and a  change in stool color.   HISTORY OF PRESENT ILLNESS:  [name] is a 60 year old African  American female that I have seen in the past.  She changed to Dr. Ritta Slot a few years ago and underwent colonoscopy by him on July 18, 2003, which showed only diverticulosis.  Prior colonoscopy by me in May  2002 showed a diminutive transverse colon polyp which showed only  polypoid mucosa with benign lymphoid tissue on biopsy and  diverticulosis.  Prior upper endoscopy by me on Jun 19, 1995 showed a  small sliding hiatal hernia.  She has had irritable bowel syndrome for  many years and describes multiple loose bowel movements in the morning.  She has abdominal discomfort and bloating on a daily basis.  Her  symptoms tend to worsen after meals.  They begin in the left upper  quadrant and can radiate to the epigastrium and across her lower  abdomen.  She states she has noted a change in stool color with darker  stools, sometimes almost black in color and an occasional reddish tint  to her stools.  She does drink quite a bit of cranberry juice.  She  really has not noted any clear cut melena, red blood, dark blood or  clots.  She has had worsening nausea for the past month or so.  She  takes Excedrin Migraine on a daily basis for frequent headaches.  She  saw Dr. Corliss Skains recently and apparently had blood work performed.   PAST MEDICAL HISTORY:  Fibromyalgia syndrome, degenerative disc disease,  irritable bowel syndrome, diverticulosis, GERD, asthma, hyperlipidemia,  hypothyroidism, depression, allergic  rhinitis, chronic headaches, sleep  apnea, chronic fatigue syndrome, status post hysterectomy, status post  breast cyst removal.   MEDICATIONS:  Listed on the chart, updated and reviewed.   MEDICATION ALLERGIES:  CODEINE, VIOXX, CELEBREX, ERYTHROMYCIN, and  INTRAVENOUS CONTRAST DYE.   FAMILY HISTORY:  Is remarkable for a sister with colon cancer and her  brother, mother and father all have colon polyps.   SOCIAL HISTORY, REVIEW OF SYSTEMS:  Per the hand written form.   PHYSICAL EXAMINATION:  Morbidly obese African American female.  Height 5 feet 4-1/2 inches.  Blood pressure 120/70, pulse 80 and  regular.  HEENT:  Anicteric sclerae, oropharynx clear.  CHEST:  Clear to auscultation.  CARDIAC:  Regular rate and rhythm without murmurs appreciated.  ABDOMEN:  Is large with mild diffuse tenderness to deep palpation, no  rebound or guarding, no palpable organomegaly, masses or hernias.  Normoactive bowel sounds.  RECTAL:  Reveals soft, brown Hemoccult negative stool in the vault.  No  lesions.  EXTREMITIES:  Without clubbing, cyanosis or edema.  NEUROLOGIC:  Alert and oriented  x3.  Grossly nonfocal.   ASSESSMENT AND PLAN:  1. I suspect the majority of her symptoms are related to irritable      bowel syndrome.  She is likely seeing a variation in stool color      due to her rapid transient time.  Trial of Robinul Forte 1/2 to 1      whole tablet b.i.d.  She is given standard information on      management of irritable bowel syndrome.  Need to exclude ulcer      disease, gastritis, esophagitis and Barrett's esophagus.  Continue      standard antireflux measures and Nexium 40 mg p.o. q. a.m.  Risks,      benefits and alternatives to upper endoscopy with possible biopsy      discussed with the patient and she consents to proceed.  This will      be scheduled electively.  2. First degree relative with colon cancer.  Recall colonoscopy      recommended for June 2010.     [name].  Margaret Dar, MD, [facility]  Electronically Signed    [name]  DD: 02/22/2006  DT: 02/22/2006  Job #: [id number]   cc:   Lonzo Cloud. Kriste Basque, MD

## 2010-06-10 NOTE — Procedures (Signed)
[name], [name]              ACCOUNT NO.:  [id number]   MEDICAL RECORD NO.:  [id number]          PATIENT TYPE:  OUT   LOCATION:  SLEEP CENTER                 FACILITY:  [facility]   PHYSICIAN:  Marcelyn Bruins, M.D. [facility] DATE OF BIRTH:  February 11, 1950   DATE OF STUDY:  03/06/2004                              NOCTURNAL POLYSOMNOGRAM   REFERRING PHYSICIAN:  Dr. Alroy Dust   DATE OF STUDY:  March 06, 2004   INDICATION FOR THE STUDY:  Hypersomnia with sleep apnea. Epworth score is 6.   SLEEP ARCHITECTURE:  The patient had a total sleep time of 257 minutes with  a sleep efficiency of 71%. There was decreased REM and the patient never  achieved slow wave sleep. Sleep onset latency was prolonged at 35 minutes,  however REM onset was fairly short.   IMPRESSION:  1.  Mild to moderate obstructive sleep apnea/hypopnea syndrome with a      respiratory disturbance index of 23 events per hour and desaturation as      low as 81%. Events were primarily hypopneas and were not positional.  2.  Very loud snoring noted throughout the study.  3.  No clinically significant cardiac arrhythmias.  4.  Small numbers of leg jerks, however, there seemed to be significant      sleep disruption. Clinical correlation is suggested.      KC/MEDQ  D:  03/14/2004 12:02:02  T:  03/14/2004 20:40:29  Job:  [id number]

## 2010-06-10 NOTE — Assessment & Plan Note (Signed)
[name]                               PULMONARY OFFICE NOTE   NAME:[name], Margaret [name]                     MRN:          [id number]  DATE:11/01/2005                            DOB:          16-Jul-1950   HISTORY OF PRESENT ILLNESS:  The patient is a 60 year old African-American  female patient of Dr. Jodelle Parker who has a known history of allergic rhinitis,  asthmatic bronchitis and obstructive sleep apnea on nocturnal CPAP.  The  patient has recently been seen in the office for recurrent asthmatic  bronchitis and received two courses of antibiotics including Avelox and  Biaxin XL and a prednisone taper. The patient reports that symptoms have not  improved over the last month and she continues to have cough, congestion and  nocturnal wheezing. The patient denies any hemoptysis, orthopnea, PND or leg  swelling. The patient is maintained on Advair 500/50 twice daily.   PAST MEDICAL HISTORY:  Reviewed.   CURRENT MEDICATIONS:  Reviewed.   PHYSICAL EXAMINATION:  GENERAL:  The patient is an obese black female in no  acute distress.  VITAL SIGNS:  She is afebrile with stable vital signs. Saturation is 98% on  room air.  HEENT:  Unremarkable.  NECK:  Supple without adenopathy, no JVD.  LUNGS:  Completely clear bilaterally without wheezing or crackles. The  patient does have some upper airway pseudo-wheezing on forced expiration.  CARDIAC:  Regular rate and rhythm.  ABDOMEN:  Morbidly obese and soft.  EXTREMITIES:  Warm without any calf cyanosis, clubbing or edema.   LABORATORY DATA:  In office spirometry reveals an FEV1 of 1.93 liters which  was 84% of the predicted with mid flows at 2.14 which was also 84% of the  predicted.   IMPRESSION AND PLAN:  Acute bronchitis slow to resolve. Suspect patient has  some upper airway irritability. The patient does have known history of  gastroesophageal reflux and has been off Nexium for some time. I recommended  patient restart Nexium 40 mg daily and use Mucinex DM and Tussionex p.r.n.  for cough suppression regimen.  The patient's chest x-ray is pending at the  time of dictation. The patient will recheck here in 2 weeks with Dr. Kriste Basque.  The patient is  also on Advair 500/50 and question if this is the dry powder that is causing  some upper airway irritation.  May need to consider switching to a different  inhaler type.    ______________________________  Rubye Oaks, NP    ______________________________  Lonzo Cloud. Kriste Basque, MD   TP/MedQ DD:  11/01/2005 DT:  11/02/2005 Job #:  [id number]

## 2010-08-09 ENCOUNTER — Other Ambulatory Visit (INDEPENDENT_AMBULATORY_CARE_PROVIDER_SITE_OTHER): Payer: Medicare Other

## 2010-08-09 DIAGNOSIS — N951 Menopausal and female climacteric states: Secondary | ICD-10-CM

## 2010-08-12 ENCOUNTER — Other Ambulatory Visit: Payer: Medicare Other

## 2010-08-25 ENCOUNTER — Other Ambulatory Visit: Payer: Self-pay

## 2010-08-25 MED ORDER — ESTRADIOL 0.075 MG/24HR TD PTWK: 1.0000 | MEDICATED_PATCH | TRANSDERMAL | Status: DC

## 2010-10-18 LAB — CBC
MCHC: 32.3
MCV: 87.7
Platelets: 310
RBC: 5.11
RDW: 14.1

## 2010-10-18 LAB — COMPREHENSIVE METABOLIC PANEL
ALT: 18
AST: 17
CO2: 26
Calcium: 9.1
Creatinine, Ser: 0.85
GFR calc Af Amer: >60
GFR calc non Af Amer: >60
Sodium: 143
Total Protein: 7

## 2010-10-18 LAB — URINALYSIS, ROUTINE W REFLEX MICROSCOPIC
Bilirubin Urine: NEGATIVE
Glucose, UA: NEGATIVE
Hgb urine dipstick: NEGATIVE
Ketones, ur: NEGATIVE
pH: 5.5

## 2010-10-18 LAB — DIFFERENTIAL
Eosinophils Absolute: 0.1
Eosinophils Relative: 1
Lymphocytes Relative: 24
Lymphs Abs: 2.4
Monocytes Relative: 6

## 2010-10-18 LAB — POCT CARDIAC MARKERS
CKMB, poc: 1.7
Operator id: 151321
Troponin i, poc: <0.05

## 2010-10-18 LAB — LIPASE, BLOOD: Lipase: 22

## 2010-10-20 LAB — COMPREHENSIVE METABOLIC PANEL
ALT: 15
AST: 17
AST: 19
Albumin: 3.2 — ABNORMAL LOW
Alkaline Phosphatase: 63
CO2: 29
Calcium: 8.5
Chloride: 106
Creatinine, Ser: 0.79
GFR calc Af Amer: >60
GFR calc non Af Amer: >60
Glucose, Bld: 101 — ABNORMAL HIGH
Potassium: 4.2
Sodium: 141
Total Bilirubin: 0.7
Total Protein: 6.5

## 2010-10-20 LAB — URINALYSIS, ROUTINE W REFLEX MICROSCOPIC
Bilirubin Urine: NEGATIVE
Glucose, UA: NEGATIVE
Hgb urine dipstick: NEGATIVE
Ketones, ur: NEGATIVE
Protein, ur: NEGATIVE

## 2010-10-20 LAB — CBC
Hemoglobin: 14.5
MCHC: 33.6
Platelets: 274
RDW: 13.6
RDW: 14
WBC: 8.7

## 2010-10-20 LAB — TSH: TSH: 3.615

## 2010-10-20 LAB — D-DIMER, QUANTITATIVE: D-Dimer, Quant: 1.42 — ABNORMAL HIGH

## 2010-10-20 LAB — DIFFERENTIAL
Basophils Relative: 1
Eosinophils Absolute: 0.1
Eosinophils Relative: 1
Monocytes Absolute: 0.4
Monocytes Relative: 5

## 2010-10-20 LAB — CK TOTAL AND CKMB (NOT AT ARMC): Total CK: 132

## 2010-10-20 LAB — SEDIMENTATION RATE: Sed Rate: 13

## 2010-10-20 LAB — HEMOGLOBIN A1C
Hgb A1c MFr Bld: 5.4
Mean Plasma Glucose: 115

## 2010-11-02 ENCOUNTER — Other Ambulatory Visit: Payer: Self-pay | Admitting: Obstetrics and Gynecology

## 2010-11-21 ENCOUNTER — Encounter: Payer: Self-pay | Admitting: Gynecology

## 2010-11-21 DIAGNOSIS — J45909 Unspecified asthma, uncomplicated: Secondary | ICD-10-CM | POA: Insufficient documentation

## 2010-11-21 DIAGNOSIS — M8580 Other specified disorders of bone density and structure, unspecified site: Secondary | ICD-10-CM | POA: Insufficient documentation

## 2010-11-21 DIAGNOSIS — N809 Endometriosis, unspecified: Secondary | ICD-10-CM | POA: Insufficient documentation

## 2010-11-22 ENCOUNTER — Other Ambulatory Visit: Payer: Self-pay | Admitting: Obstetrics and Gynecology

## 2010-11-29 ENCOUNTER — Encounter: Payer: Self-pay | Admitting: Obstetrics and Gynecology

## 2010-11-29 ENCOUNTER — Ambulatory Visit (INDEPENDENT_AMBULATORY_CARE_PROVIDER_SITE_OTHER): Payer: Medicare Other | Admitting: Obstetrics and Gynecology

## 2010-11-29 VITALS — BP 120/74 | Ht 64.5 in | Wt 200.0 lb

## 2010-11-29 DIAGNOSIS — N949 Unspecified condition associated with female genital organs and menstrual cycle: Secondary | ICD-10-CM

## 2010-11-29 DIAGNOSIS — N951 Menopausal and female climacteric states: Secondary | ICD-10-CM

## 2010-11-29 DIAGNOSIS — R102 Pelvic and perineal pain: Secondary | ICD-10-CM

## 2010-11-29 DIAGNOSIS — B373 Candidiasis of vulva and vagina: Secondary | ICD-10-CM

## 2010-11-29 DIAGNOSIS — M810 Age-related osteoporosis without current pathological fracture: Secondary | ICD-10-CM

## 2010-11-29 DIAGNOSIS — N6019 Diffuse cystic mastopathy of unspecified breast: Secondary | ICD-10-CM

## 2010-11-29 DIAGNOSIS — N898 Other specified noninflammatory disorders of vagina: Secondary | ICD-10-CM

## 2010-11-29 DIAGNOSIS — Z780 Asymptomatic menopausal state: Secondary | ICD-10-CM

## 2010-11-29 MED ORDER — CLIMARA 0.075 MG/24HR TD PTWK: 0.0750 | MEDICATED_PATCH | TRANSDERMAL | Status: DC

## 2010-11-29 MED ORDER — FLUCONAZOLE 150 MG PO TABS: 150.0000 mg | ORAL_TABLET | Freq: Once | ORAL | Status: AC

## 2010-11-29 NOTE — Progress Notes (Signed)
Subjective:     Patient ID: [name], female   DOB: [birth year], 60 y.o.   MRN: [id number]  HPIpatient came back today complaining of a stringy white vaginal discharge without itching. It's been going on for several months now. She sometimes related it to pelvic pain. The pain is without nausea vomiting or change in bowel habits. She was worried that her endometriosis had recurred. She is status post vaginal hysterectomy, bilateral salpingo-oophorectomy done at different times. She remains on a Climara patch with excellent results. She's had recurrent mastodynia and  fibrocystic disease of her breasts. She's had both diagnostic mammograms and MRI of her breasts and is now due for routine followup. She does have a bone density at [facility] and does not know the results yet. Last year she received IV Reclast for bone loss and did have a flulike syndrome in spite of using Tylenol. She will make sure I get a copy of the bone density report. She takes calcium and vitamin D. She has had no fractures.  Review of Systems  Constitutional:       History of goiter and thyroid dysfunction  HENT: Negative.   Eyes: Negative.   Respiratory: Positive for shortness of breath.   Cardiovascular:       Mitral valve prolapse, hypertension, and hyperlipidemia  Gastrointestinal:       Abdominal pain, GERD,peptic ulcer disease, and colonic polyps  Genitourinary: Negative.   Musculoskeletal: Positive for back pain.  Neurological: Negative.   Hematological:       Sickle cell trait  Psychiatric/Behavioral: Negative.        Objective:   Physical ExamHEENT: Within normal limits.  Kennon Portela present Neck: No masses. Supraclavicular lymph nodes: Not enlarged. Breasts: Examined in both sitting and lying position. Symmetrical without skin changes or masses. Abdomen: Soft no masses guarding or rebound. No hernias. Pelvic: External within normal limits. BUS within normal limits. Vaginal examination shows good  estrogen effect, no cystocele enterocele or rectocele. Cervix and uterus absent. Adnexa within normal limits. Rectovaginal confirmatory. Extremities within normal limits.  Wet prep positive for yeast      Assessment:     #1. Abdominal pain #2. Yeast vaginitis #3. Osteoporosis #4. Vasomotor symptoms #5. Mastodynia    Plan:     Diflucan 150 mg daily for 4 days. Regular mammogram. Continue Climara patches 0.075 mg weekly. Suggested she take a break every 2-3 patches for several days to reduce mastodynia. Patient to me her bone density. Encouraged to do IV Reclast again. Told her the flulike syndrome usually is just one time.

## 2010-11-29 NOTE — Progress Notes (Signed)
Addended byCammie Mcgee T on: 11/29/2010 10:59 AM   Modules accepted: Orders

## 2010-12-05 ENCOUNTER — Other Ambulatory Visit: Payer: Self-pay | Admitting: Obstetrics and Gynecology

## 2010-12-05 DIAGNOSIS — Z1231 Encounter for screening mammogram for malignant neoplasm of breast: Secondary | ICD-10-CM

## 2010-12-13 ENCOUNTER — Ambulatory Visit
Admission: RE | Admit: 2010-12-13 | Discharge: 2010-12-13 | Disposition: A | Discharge status: Home / Self Care | Payer: Medicare Other | Source: Ambulatory Visit | Attending: Obstetrics and Gynecology | Admitting: Obstetrics and Gynecology

## 2010-12-13 DIAGNOSIS — Z1231 Encounter for screening mammogram for malignant neoplasm of breast: Secondary | ICD-10-CM

## 2010-12-19 ENCOUNTER — Other Ambulatory Visit: Payer: Self-pay | Admitting: Obstetrics and Gynecology

## 2010-12-19 DIAGNOSIS — R928 Other abnormal and inconclusive findings on diagnostic imaging of breast: Secondary | ICD-10-CM

## 2010-12-22 ENCOUNTER — Other Ambulatory Visit: Payer: Self-pay

## 2010-12-22 DIAGNOSIS — N63 Unspecified lump in unspecified breast: Secondary | ICD-10-CM

## 2011-01-06 ENCOUNTER — Telehealth: Payer: Self-pay | Admitting: *Deleted

## 2011-01-06 NOTE — Telephone Encounter (Signed)
Called the [facility] to let them know that the patient called this morning to say she still has not gotten her screening results back from the 12/08/10 appointment.  Arline Asp said she would investigate why she was not called, and contact the patient.

## 2011-01-09 ENCOUNTER — Other Ambulatory Visit: Payer: Self-pay | Admitting: Obstetrics and Gynecology

## 2011-01-09 ENCOUNTER — Ambulatory Visit
Admission: RE | Admit: 2011-01-09 | Discharge: 2011-01-09 | Disposition: A | Discharge status: Home / Self Care | Payer: Medicare Other | Source: Ambulatory Visit | Attending: Obstetrics and Gynecology | Admitting: Obstetrics and Gynecology

## 2011-01-09 DIAGNOSIS — N63 Unspecified lump in unspecified breast: Secondary | ICD-10-CM

## 2011-01-10 ENCOUNTER — Telehealth: Payer: Self-pay | Admitting: *Deleted

## 2011-01-10 NOTE — Telephone Encounter (Signed)
Pt informed with all the below note, she is willing to have MRI done if reccommended due to family history of breast cancer.

## 2011-01-10 NOTE — Telephone Encounter (Signed)
Pt would like to know if you think she should try reclast regarding her osteopenia or try prolia? Pt states that she got very sick on reclast last time. Pt would just like to know which medication would be best for her body. Please advise

## 2011-01-10 NOTE — Telephone Encounter (Signed)
Generally you only get a bad reaction the first year to Reclast although that is not a guarantee. She will not be approved for Prolia because she has osteopenia and not osteoporosis. If she remembers I was not to the one to suggest she be treated initially anyway. I am not convinced of the moment that she needs treatment. I noticed that the radiologist talked to her about an MRI of her breasts. I think with her family history and dense breast tissue that is a good idea but usually  every other year. I suggest she called Dr. Chilton Si back and see if she will knew she had an MRI the year before. I also agreed with Dr. Chilton Si about her getting genetic counseling at the breast center.

## 2011-01-18 NOTE — Telephone Encounter (Signed)
Spoke with Olegario Messier at [facility].  They will be calling patient to schedule MRI.

## 2011-01-31 ENCOUNTER — Encounter: Payer: Self-pay | Admitting: Obstetrics and Gynecology

## 2011-01-31 ENCOUNTER — Other Ambulatory Visit: Payer: Self-pay | Admitting: Obstetrics and Gynecology

## 2011-01-31 DIAGNOSIS — Z803 Family history of malignant neoplasm of breast: Secondary | ICD-10-CM

## 2011-02-02 ENCOUNTER — Other Ambulatory Visit (INDEPENDENT_AMBULATORY_CARE_PROVIDER_SITE_OTHER): Payer: Medicare Other

## 2011-02-02 ENCOUNTER — Ambulatory Visit (INDEPENDENT_AMBULATORY_CARE_PROVIDER_SITE_OTHER)
Admission: RE | Admit: 2011-02-02 | Discharge: 2011-02-02 | Disposition: A | Discharge status: Home / Self Care | Payer: Medicare Other | Source: Ambulatory Visit | Attending: Physician Assistant | Admitting: Physician Assistant

## 2011-02-02 ENCOUNTER — Encounter: Payer: Self-pay | Admitting: Physician Assistant

## 2011-02-02 ENCOUNTER — Ambulatory Visit (INDEPENDENT_AMBULATORY_CARE_PROVIDER_SITE_OTHER): Payer: Medicare Other | Admitting: Physician Assistant

## 2011-02-02 ENCOUNTER — Telehealth: Payer: Self-pay | Admitting: Gastroenterology

## 2011-02-02 DIAGNOSIS — Z8719 Personal history of other diseases of the digestive system: Secondary | ICD-10-CM

## 2011-02-02 DIAGNOSIS — R109 Unspecified abdominal pain: Secondary | ICD-10-CM

## 2011-02-02 LAB — CBC WITH DIFFERENTIAL/PLATELET
Basophils Absolute: 0 10*3/uL (ref 0.0–0.1)
Eosinophils Absolute: 0 10*3/uL (ref 0.0–0.7)
HCT: 47.4 % — ABNORMAL HIGH (ref 36.0–46.0)
Lymphocytes Relative: 23.8 % (ref 12.0–46.0)
Lymphs Abs: 1.9 10*3/uL (ref 0.7–4.0)
MCHC: 33.5 g/dL (ref 30.0–36.0)
Monocytes Relative: 5.1 % (ref 3.0–12.0)
Neutro Abs: 5.5 10*3/uL (ref 1.4–7.7)
Platelets: 306 10*3/uL (ref 150.0–400.0)
RDW: 13.7 % (ref 11.5–14.6)

## 2011-02-02 LAB — BASIC METABOLIC PANEL
CO2: 28 mEq/L (ref 19–32)
Calcium: 8.8 mg/dL (ref 8.4–10.5)
Creatinine, Ser: 0.8 mg/dL (ref 0.4–1.2)
GFR: 96.06 mL/min (ref >60.00)
Sodium: 142 mEq/L (ref 135–145)

## 2011-02-02 MED ORDER — IOHEXOL 300 MG/ML  SOLN
100.0000 mL | Freq: Once | INTRAMUSCULAR | Status: AC | PRN
Start: 2011-02-02 — End: 2011-02-02
  Administered 2011-02-02: 100 mL via INTRAVENOUS

## 2011-02-02 NOTE — Patient Instructions (Signed)
Please go to the basement level to have your labs drawn.   We have scheduled the Ct Scan for today, 02-02-2011.  You have been scheduled for a CT scan of the abdomen and pelvis at [facility] ([address]---this is in the same building as Architectural technologist).   You are scheduled on 02-02-2011 at 3:00 PM . You should arrive 15 minutes prior to your appointment time for registration. Arrive at 2:45 PM.  Please follow the written instructions below on the day of your exam:  WARNING: IF YOU ARE ALLERGIC TO IODINE/X-RAY DYE, PLEASE NOTIFY RADIOLOGY IMMEDIATELY AT [phone]! YOU WILL BE GIVEN A 13 HOUR PREMEDICATION PREP.  1) Do not eat or drink anything after 11:45 AM  (4 hours prior to your test) 2) You have been given 2 bottles of oral contrast to drink. The solution may taste               better if refrigerated, but do NOT add ice or any other liquid to this solution. Shake             well before drinking.    Drink 1 bottle of contrast @1 :00 PM (2 hours prior to your exam)  Drink 1 bottle of contrast @ 2:00 PM  (1 hour prior to your exam)  You may take any medications as prescribed with a small amount of water except for the following: Metformin, Glucophage, Glucovance, Avandamet, Riomet, Fortamet, Actoplus Met, Janumet, Glumetza or Metaglip. The above medications must be held the day of the exam AND 48 hours after the exam.  The purpose of you drinking the oral contrast is to aid in the visualization of your intestinal tract. The contrast solution may cause some diarrhea. Before your exam is started, you will be given a small amount of fluid to drink. Depending on your individual set of symptoms, you may also receive an intravenous injection of x-ray contrast/dye. Plan on being at [facility] for 30 minutes or long, depending on the type of exam you are having performed.  If you have any questions regarding your exam or if you need to reschedule, you may call the CT  department at [phone] between the hours of 8:00 am and 5:00 pm, Monday-Friday.  ________________________________________________________________________

## 2011-02-02 NOTE — Progress Notes (Signed)
Subjective:    Patient ID: [name], female    DOB: [birth year], 61 y.o.   MRN: [id number]  HPI [name] is a pleasant 61 year old African American female known to Dr. Russella Dar. She has multiple medical issues, including asthma, COPD, fibromyalgia with chronic pain syndrome,hyperlipidemia obesity, depression and IBS. She does have positive family history of colon cancer in her sister. She had undergone colonoscopy in May of 2009 which was negative with the exception of left-sided diverticulosis. She had upper endoscopy also in 2009 showing a 3 cm hiatal hernia. She is status post hysterectomy and has had laparoscopy x2, remotely for endometriosis and then separate  Oophorectomy.She has history of partial small bowel obstruction x2 in the past, the last was in May of 2011 at which time she was admitted. She did resolve with conservative management.  She comes in today with complaints of a constant left-sided abdominal pain x4 days which she describes as sharp and pressure-like and worse with movement. She also feels that her pain is worse postprandially. She had not had a bowel movement for about 6 days but after starting herself on MiraLax twice daily over the past few days she did have a small bowel movement this morning. She says she is worried that she has an obstruction. She feels somewhat bloated and full feeling. She has not had any fever or chills and no nausea or vomiting.    Review of Systems  Constitutional: Negative.   HENT: Negative.   Eyes: Negative.   Respiratory: Negative.   Cardiovascular: Negative.   Gastrointestinal: Positive for abdominal pain, constipation and abdominal distention.  Genitourinary: Negative.   Musculoskeletal: Positive for back pain.  Neurological: Negative.   Hematological: Negative.   Psychiatric/Behavioral: Negative.    Outpatient Prescriptions Prior to Visit  Medication Sig Dispense Refill  . Albuterol (VENTOLIN IN) Inhale into the lungs.        Mack Guise THYROID PO Take by mouth.        . Cholecalciferol (VITAMIN D PO) Take 1,200 Units by mouth.       Marland Kitchen CLIMARA 0.075 MG/24HR PLACE 1 PATCH ONTO SKIN ONCE A WEEK  4 each  0  . CLIMARA 0.075 MG/24HR Place 1 patch (0.075 mg total) onto the skin once a week.  13 patch  4  . Colesevelam HCl (WELCHOL PO) Take by mouth.        . diclofenac (FLECTOR) 1.3 % PTCH Place 1 patch onto the skin daily.        . diclofenac sodium (VOLTAREN) 1 % GEL Apply topically.        . fentaNYL (DURAGESIC - DOSED MCG/HR) 25 MCG/HR Place 1 patch onto the skin every 3 (three) days.        Marland Kitchen LACTULOSE PO Take by mouth.        Marland Kitchen LOSARTAN POTASSIUM PO Take by mouth.        Marland Kitchen PANTOPRAZOLE SODIUM PO Take by mouth.        . TERBINAFINE HCL PO Take by mouth.        Marland Kitchen TIZANIDINE HCL PO Take by mouth.        . Topiramate (TOPIRAGEN PO) Take by mouth.        Marland Kitchen UNABLE TO FIND C-PAP       . Zoledronic Acid (RECLAST IV) Inject into the vein.         Allergies  Allergen Reactions  . Celecoxib   . Codeine     REACTION:  Nausea  . Duloxetine     REACTION: n/v  . Erythromycin     REACTION: Itching  . Ezetimibe   . Iohexol      Desc: VIOX, CELEBREX, EMYCIN   . Milnacipran     REACTION: n/v  . Pregabalin     REACTION: wt gain  . Rofecoxib   . Statins        Objective:   Physical Exam well-developed American female in no acute distress, pleasant. HEENT; nontraumatic normocephalic EOMI PERRLA sclera anicteric, Supple no JVD, Cardiovascular; regular rate and rhythm with S1-S2 no murmur or gallop, Pulmonary; clear bilaterally, Abdomen; large soft she is tender in the left mid quadrant  and left lower quadrant, and also mildly tender across her upper abdomen. She has no palpable mass or hepatosplenomegaly bowel sounds are somewhat hyperactive is no guarding no rebound does have midline incisional scar. Rectal; not done. Extremities; no clubbing,cyanosis or edema, Psych; mood and affect normal and  appropriate          Assessment & Plan:  #73 61 year old female with 4 day history of constant left-sided abdominal pain worse with movement and by mouth intake. This is been associated with obstipation. Patient does have prior history of partial small bowel obstruction secondary to adhesions. Will need to rule out possible partial low-grade small bowel obstruction versus diverticulitis. Plan; CBC and CMET today  CT scan of the abdomen and pelvis within the next 24 hours. Continue MiraLax twice daily Further plans pending results of CT  #2 positive family history of colon cancer and a sibling, patient will be due for followup colonoscopy 2014

## 2011-02-02 NOTE — Telephone Encounter (Signed)
Patient with a history of small bowel obstruction.  She reports a 6 day history of constipation and abdominal pain.  She reports that she has tried Miralx BID with very minimal results.  She reports also LUQ pain very similar to the last SBO she has.  She is on a duragesic patch and using other pain meds for breakthrough pain.  She denies fever or nausea or vomiting.  She will come in this am at 10:30 and see Amy Esterwood PA at 10:30

## 2011-02-03 NOTE — Progress Notes (Signed)
I agree with assessment and plan.

## 2011-02-06 ENCOUNTER — Telehealth: Payer: Self-pay | Admitting: *Deleted

## 2011-02-06 NOTE — Telephone Encounter (Signed)
Patient was informed that Breast MRI precert was denied by her secondary insurance [address] and [employer]. Dr. Eda Paschal did a peer to peer review with Dr. Clydell Hakim at [phone].  Dr Eda Paschal reviewed with the doctor her need for Bilat Breast MRI because of her strong family history of breast cancer.  Claim was still denied because of Dondra Spry Risk being too low.  Patient was informed that Olegario Messier at the Breast center was contacted and will be getting a note to Dr. Christiana Pellant that read her diagnostic films about the denial, and will be contacting her later today on if Medicare (her primary insurance) would cover it at all.

## 2011-02-06 NOTE — Telephone Encounter (Signed)
Precert for insurance was previously denied.  They called back to say they "re-reviewed" the case and have approved it.  Auth number is [id number] good for 30 days.  Contacted Breast Center and they will speak to patient.

## 2011-02-09 ENCOUNTER — Other Ambulatory Visit: Payer: Medicare Other

## 2011-02-09 ENCOUNTER — Ambulatory Visit
Admission: RE | Admit: 2011-02-09 | Discharge: 2011-02-09 | Disposition: A | Discharge status: Home / Self Care | Payer: Medicare Other | Source: Ambulatory Visit | Attending: Obstetrics and Gynecology | Admitting: Obstetrics and Gynecology

## 2011-02-09 DIAGNOSIS — Z803 Family history of malignant neoplasm of breast: Secondary | ICD-10-CM

## 2011-02-09 MED ORDER — GADOBENATE DIMEGLUMINE 529 MG/ML IV SOLN
18.0000 mL | Freq: Once | INTRAVENOUS | Status: AC | PRN
  Administered 2011-02-09: 18 mL via INTRAVENOUS

## 2011-02-14 ENCOUNTER — Telehealth: Payer: Self-pay | Admitting: Gastroenterology

## 2011-02-14 NOTE — Telephone Encounter (Signed)
Patient reports that she is having BM and is taking Miralax and other OTC laxatives.  She has continued abdominal pain on the left side.  CT scan was negative on 1/10.  She reports she has had no improvement in her pain at all, but has not contacted [place] until today.  She wants to have an endo/colon.  She will come in on Thursday at 9:15 to discuss with Dr Russella Dar

## 2011-02-16 ENCOUNTER — Encounter: Payer: Self-pay | Admitting: Gastroenterology

## 2011-02-16 ENCOUNTER — Ambulatory Visit (INDEPENDENT_AMBULATORY_CARE_PROVIDER_SITE_OTHER): Payer: Medicare Other | Admitting: Gastroenterology

## 2011-02-16 VITALS — BP 132/78 | HR 64 | Ht 64.0 in | Wt 199.0 lb

## 2011-02-16 DIAGNOSIS — K5900 Constipation, unspecified: Secondary | ICD-10-CM

## 2011-02-16 DIAGNOSIS — R1012 Left upper quadrant pain: Secondary | ICD-10-CM

## 2011-02-16 MED ORDER — PEG-KCL-NACL-NASULF-NA ASC-C 100 G PO SOLR: 1.0000 | Freq: Once | ORAL | Status: DC

## 2011-02-16 NOTE — Patient Instructions (Signed)
You have been scheduled for a Colonoscopy with propofol. See separate instructions. Pick up your prep kit from your pharmacy.  Stop taking your Lactulose and increase your Miralax 2-3 x daily.  cc: Oliver Barre, MD

## 2011-02-16 NOTE — Progress Notes (Signed)
History of Present Illness: This is a 61 year old female who relates left upper quadrant pain and constipation. Blood work and an abdominal pelvic CT scan were unremarkable except for constipation. She has had some improvement in her symptoms using MiraLax regularly. Still is not achieving complete evacuation. Denies weight loss, abdominal pain, constipation, diarrhea, change in stool caliber, melena, hematochezia, nausea, vomiting, dysphagia, reflux symptoms, chest pain.  Current Medications, Allergies, Past Medical History, Past Surgical History, Family History and Social History were reviewed in [employer] record.  Physical Exam: General: Well developed , well nourished, no acute distress Head: Normocephalic and atraumatic Eyes:  sclerae anicteric, EOMI Ears: Normal auditory acuity Mouth: No deformity or lesions Lungs: Clear throughout to auscultation, mild tenderness along left costal margin and left lower chest wall anteriorly Heart: Regular rate and rhythm; no murmurs, rubs or bruits Abdomen: Soft, mild left upper quadrant tenderness without rebound or guarding and non distended. No masses, hepatosplenomegaly or hernias noted. Normal Bowel sounds Musculoskeletal: Symmetrical with no gross deformities  Pulses:  Normal pulses noted Extremities: No clubbing, cyanosis, edema or deformities noted Neurological: Alert oriented x 4, grossly nonfocal Psychological:  Alert and cooperative. Flat affect  Assessment and Recommendations:  1. Constipation and left upper quadrant pain. Increase MiraLax to 2-3 times daily. Discontinue lactulose. Continue dicyclomine 3 times a day as needed. Rule out colorectal lesions. The risks, benefits, and alternatives to colonoscopy with possible biopsy and possible polypectomy were discussed with the patient and they consent to proceed.   2. Left costochondral margin pain which which seems to be musculoskeletal, possibly fibromyalgia  related. Further followup with her primary physician.  3. Family history of colon cancer in her sister. Colonoscopy as above.

## 2011-02-21 ENCOUNTER — Other Ambulatory Visit: Payer: Self-pay | Admitting: Internal Medicine

## 2011-02-24 ENCOUNTER — Ambulatory Visit (AMBULATORY_SURGERY_CENTER): Payer: Medicare Other | Admitting: Gastroenterology

## 2011-02-24 ENCOUNTER — Encounter: Payer: Self-pay | Admitting: Gastroenterology

## 2011-02-24 DIAGNOSIS — Z800 Family history of malignant neoplasm of digestive organs: Secondary | ICD-10-CM

## 2011-02-24 DIAGNOSIS — Z1211 Encounter for screening for malignant neoplasm of colon: Secondary | ICD-10-CM

## 2011-02-24 DIAGNOSIS — K5900 Constipation, unspecified: Secondary | ICD-10-CM

## 2011-02-24 DIAGNOSIS — R1012 Left upper quadrant pain: Secondary | ICD-10-CM

## 2011-02-24 MED ORDER — SODIUM CHLORIDE 0.9 % IV SOLN: 500.0000 mL | INTRAVENOUS | Status: DC

## 2011-02-24 NOTE — Patient Instructions (Signed)
FOLLOW DISCHARGE INSTRUCTIONS (BLUE & GREEN SHEETS).  INFO. ON DIVERTICULOSIS & HIGH FIBER DIET GIVEN TO YOU.

## 2011-02-24 NOTE — Progress Notes (Signed)
Patient did not experience any of the following events: a burn prior to discharge; a fall within the facility; wrong site/side/patient/procedure/implant event; or a hospital transfer or hospital admission upon discharge from the facility. (G8907) Patient did not have preoperative order for IV antibiotic SSI prophylaxis. (G8918)  

## 2011-02-24 NOTE — Op Note (Signed)
[facility] [address] [place], [place]  [address]  COLONOSCOPY PROCEDURE REPORT  PATIENT:  Margaret, Parker  MR#:  [id number] BIRTHDATE:  11/12/1950, 60 yrs. old  GENDER:  female ENDOSCOPIST:  Judie Petit T. Russella Dar, MD, [facility]  PROCEDURE DATE:  02/24/2011 PROCEDURE:  Colonoscopy 19147 ASA CLASS:  Class III INDICATIONS:  1) abdominal pain  2) constipation  3) family history of colon cancer: sister age 65. MEDICATIONS:   MAC sedation, administered by CRNA, propofol (Diprivan) 150 mg IV DESCRIPTION OF PROCEDURE:   After the risks benefits and alternatives of the procedure were thoroughly explained, informed consent was obtained.  Digital rectal exam was performed and revealed no abnormalities.   The LB CF-[id number] endoscope was introduced through the anus and advanced to the cecum, which was identified by both the appendix and ileocecal valve, without limitations.  The quality of the prep was excellent, using MoviPrep.  The instrument was then slowly withdrawn as the colon was fully examined. <<PROCEDUREIMAGES>> FINDINGS:  Scattered diverticula were found in the ascending colon. Scattered diverticula were found in the transverse colon. Moderate diverticulosis was found in the sigmoid colon.  Otherwise normal colonoscopy without other polyps, masses, vascular ectasias, or inflammatory changes.  Retroflexed views in the rectum revealed no abnormalities.  The time to cecum =  2 minutes. The scope was then withdrawn (time =  8.5  min) from the patient and the procedure completed.  COMPLICATIONS:  None  ENDOSCOPIC IMPRESSION: 1) Diverticula, scattered in the ascending colon 2) Diverticula, scattered in the transverse colon 3) Moderate diverticulosis in the sigmoid colon  RECOMMENDATIONS: 1) High fiber diet with liberal fluid intake. 2) Repeat Colonoscopy in 5 years.  Venita Lick. Russella Dar, MD, [facility]  n. eSIGNED:   Venita Lick. Stark at 02/24/2011 04:03 PM  Silva Bandy, [phone]

## 2011-02-25 ENCOUNTER — Encounter: Payer: Self-pay | Admitting: Internal Medicine

## 2011-02-25 DIAGNOSIS — Z0001 Encounter for general adult medical examination with abnormal findings: Secondary | ICD-10-CM | POA: Insufficient documentation

## 2011-02-25 DIAGNOSIS — R7302 Impaired glucose tolerance (oral): Secondary | ICD-10-CM | POA: Insufficient documentation

## 2011-02-25 HISTORY — DX: Impaired glucose tolerance (oral): R73.02

## 2011-02-27 ENCOUNTER — Telehealth: Payer: Self-pay | Admitting: *Deleted

## 2011-02-27 ENCOUNTER — Encounter: Payer: Self-pay | Admitting: Internal Medicine

## 2011-02-27 ENCOUNTER — Other Ambulatory Visit (INDEPENDENT_AMBULATORY_CARE_PROVIDER_SITE_OTHER): Payer: Medicare Other

## 2011-02-27 ENCOUNTER — Ambulatory Visit (INDEPENDENT_AMBULATORY_CARE_PROVIDER_SITE_OTHER): Payer: Medicare Other | Admitting: Internal Medicine

## 2011-02-27 VITALS — BP 104/70 | HR 68 | Temp 97.8°F | Ht 64.0 in | Wt 197.0 lb

## 2011-02-27 DIAGNOSIS — E785 Hyperlipidemia, unspecified: Secondary | ICD-10-CM

## 2011-02-27 DIAGNOSIS — I10 Essential (primary) hypertension: Secondary | ICD-10-CM

## 2011-02-27 DIAGNOSIS — F329 Major depressive disorder, single episode, unspecified: Secondary | ICD-10-CM

## 2011-02-27 DIAGNOSIS — E039 Hypothyroidism, unspecified: Secondary | ICD-10-CM

## 2011-02-27 LAB — TSH: TSH: 1.19 u[IU]/mL (ref 0.35–5.50)

## 2011-02-27 LAB — LIPID PANEL
HDL: 58.2 mg/dL (ref >39.00)
Total CHOL/HDL Ratio: 3

## 2011-02-27 MED ORDER — THYROID 30 MG PO TABS: 30.0000 mg | ORAL_TABLET | Freq: Every day | ORAL | Status: DC

## 2011-02-27 MED ORDER — DICYCLOMINE HCL 10 MG PO CAPS: 10.0000 mg | ORAL_CAPSULE | Freq: Three times a day (TID) | ORAL | Status: DC

## 2011-02-27 NOTE — Telephone Encounter (Signed)
  Follow up Call-  Call back number 02/24/2011  Post procedure Call Back phone  # [phone]  Permission to leave phone message Yes     No answer,left message.

## 2011-02-27 NOTE — Patient Instructions (Addendum)
OK to stop the losartan Please moitor your Blood Pressure at home as you can; your goal is to be < 140/90 Please go to LAB in the Basement for the blood tests to be done today Please call the phone number [phone] (the PhoneTree System) for results of testing in 2-3 days;  When calling, simply dial the number, and when prompted enter the MRN number above (the Medical Record Number) and the # key, then the message should start. Continue all other medications as before, your thyroid and dicyclomiine medications were sent in as refills Please return in 3 months, or sooner if needed

## 2011-03-05 ENCOUNTER — Encounter: Payer: Self-pay | Admitting: Internal Medicine

## 2011-03-05 NOTE — Progress Notes (Signed)
Subjective:    Patient ID: [name], female    DOB: 1950-03-28, 61 y.o.   MRN: [id number]  HPI  Here to f/u; overall doing ok,  Pt denies chest pain, increased sob or doe, wheezing, orthopnea, PND, increased LE swelling, palpitations, dizziness or syncope.  Pt denies new neurological symptoms such as new headache, or facial or extremity weakness or numbness   Pt denies polydipsia, polyuria, or low sugar symptoms such as weakness or confusion improved with po intake.  Pt states overall good compliance with meds, trying to follow lower cholesterol, diabetic diet, wt overall stable but little exercise however.  Denies hyper or hypo thyroid symptoms such as voice, skin or hair change.  Has lost wt overall from peak 250-260 since 2009 to current 197.  Has ongoing depressive symptoms, but no suicidal ideation, or panic, but specifically declines tx with med or counseling.  Has been fatigued and somewhat dizzy occasoinally in the past wk as well.   Past Medical History  Diagnosis Date  . Endometriosis   . Osteopenia   . Asthma   . Hypertension   . Fibromyalgia   . Thyroid disease     Hypothyroid  . Elevated cholesterol   . Migraines   . Bowel obstruction   . GERD (gastroesophageal reflux disease)   . Impaired glucose tolerance 02/25/2011   Past Surgical History  Procedure Date  . Tonsillectomy   . Pelvic laparoscopy 1995,2002    DL LSO DL RSO  . Vaginal hysterectomy 1983  . Oophorectomy     LSO-BSO  . Breast surgery     Benign breast cyst    reports that she has never smoked. She has never used smokeless tobacco. She reports that she drinks about one ounce of alcohol per week. She reports that she does not use illicit drugs. family history includes Breast cancer in her maternal grandmother, mother, and paternal grandmother; Cancer in her mother; Colon cancer in her sister; Heart disease in her father and mother; Hypertension in her father and mother; Ovarian cancer in her paternal  grandmother; and Sarcoidosis in her brother and sister. Allergies  Allergen Reactions  . Celecoxib   . Codeine     REACTION: Nausea  . Duloxetine     REACTION: n/v  . Erythromycin     REACTION: Itching  . Ezetimibe   . Iohexol      Desc: VIOX, CELEBREX, EMYCIN   . Lexapro Nausea Only  . Milnacipran     REACTION: n/v  . Pregabalin     REACTION: wt gain  . Rofecoxib   . Statins    Current Outpatient Prescriptions on File Prior to Visit  Medication Sig Dispense Refill  . albuterol (PROVENTIL) (2.5 MG/3ML) 0.083% nebulizer solution Take 2.5 mg by nebulization every 6 (six) hours as needed.      . Albuterol (VENTOLIN IN) Inhale into the lungs.        . Cholecalciferol (VITAMIN D PO) Take 1,200 Units by mouth.       Marland Kitchen CLIMARA 0.075 MG/24HR PLACE 1 PATCH ONTO SKIN ONCE A WEEK  4 each  0  . diclofenac (FLECTOR) 1.3 % PTCH Place 1 patch onto the skin as needed.       . diclofenac sodium (VOLTAREN) 1 % GEL Apply topically as needed.       . fentaNYL (DURAGESIC - DOSED MCG/HR) 25 MCG/HR Place 1 patch onto the skin every 3 (three) days.        Marland Kitchen  PANTOPRAZOLE SODIUM PO Take 1 tablet by mouth daily.       . polyethylene glycol powder (MIRALAX) powder Take 17 g by mouth as needed.      Marland Kitchen TIZANIDINE HCL PO Take by mouth as needed.       . Topiramate (TOPIRAGEN PO) Take by mouth as needed.       . Bisacodyl (LAXATIVE PO) Take by mouth as needed.      Marland Kitchen LACTULOSE PO Take by mouth as needed.       Marland Kitchen UNABLE TO FIND as needed. C-PAP       Review of Systems Review of Systems  Constitutional: Negative for diaphoresis and unexpected weight change.  HENT: Negative for drooling and tinnitus.   Eyes: Negative for photophobia and visual disturbance.  Respiratory: Negative for choking and stridor.   Gastrointestinal: Negative for vomiting and blood in stool.  Genitourinary: Negative for hematuria and decreased urine volume.     Objective:   Physical Exam BP 104/70  Pulse 68  Temp(Src) 97.8 F  (36.6 C) (Oral)  Ht 5\' 4"  (1.626 m)  Wt 197 lb (89.359 kg)  BMI 33.82 kg/m2  SpO2 99% Physical Exam  VS noted Constitutional: Pt appears well-developed and well-nourished.  HENT: Head: Normocephalic.  Right Ear: External ear normal.  Left Ear: External ear normal.  Eyes: Conjunctivae and EOM are normal. Pupils are equal, round, and reactive to light.  Neck: Normal range of motion. Neck supple.  Cardiovascular: Normal rate and regular rhythm.   Pulmonary/Chest: Effort normal and breath sounds normal.  Abd:  Soft, NT, non-distended, + BS Neurological: Pt is alert. No cranial nerve deficit.  Skin: Skin is warm. No erythema.  Psychiatric: Pt behavior is normal. Thought content normal. + depressed affect, 1+ nervous    Assessment & Plan:

## 2011-03-05 NOTE — Assessment & Plan Note (Signed)
stable overall by hx and exam, most recent data reviewed with pt, and pt to continue medical treatment as before, for tsh  Lab Results  Component Value Date   TSH 1.19 02/27/2011

## 2011-03-05 NOTE — Assessment & Plan Note (Signed)
?   Overcontrolled, ok to d/c losartan,  to f/u any worsening symptoms or concerns  BP Readings from Last 3 Encounters:  02/27/11 104/70  02/24/11 123/82  02/16/11 132/78

## 2011-03-05 NOTE — Assessment & Plan Note (Signed)
stable overall by hx and exam, most recent data reviewed with pt, and pt to continue medical treatment as before Lab Results  Component Value Date   LDLCALC 111* 02/27/2011

## 2011-03-05 NOTE — Assessment & Plan Note (Signed)
Ongoing chronic, likely related to the wt loss, declines tx,  to f/u any worsening symptoms or concerns

## 2011-03-14 ENCOUNTER — Telehealth: Payer: Self-pay

## 2011-03-14 NOTE — Telephone Encounter (Signed)
Ok to d/c for now.

## 2011-03-14 NOTE — Telephone Encounter (Signed)
[facility] requesting a refill on the patients cyanocobalamin  Injection. They have not filled in one year please advise

## 2011-03-14 NOTE — Telephone Encounter (Signed)
Pharmacy informed.

## 2011-03-24 ENCOUNTER — Telehealth: Payer: Self-pay

## 2011-03-24 NOTE — Telephone Encounter (Signed)
Patient would like the B-12 prescription for injections to be filled (see note 03/14/2011). The patient stated the B-12 injections were discussed at her last OV and states they are given for her chronic fatigue and fibromyalgia. States she does not want to discontinue at this time. [facility].

## 2011-03-24 NOTE — Telephone Encounter (Signed)
A user error has taken place: encounter opened in error, closed for administrative reasons.

## 2011-03-24 NOTE — Telephone Encounter (Signed)
Patient informed and will discuss at next OV.

## 2011-03-24 NOTE — Telephone Encounter (Signed)
Very sorry, but b12 shots are not a tx for FMS or chronic fatigue;  No need further injections at this time

## 2011-05-28 ENCOUNTER — Other Ambulatory Visit: Payer: Self-pay | Admitting: Obstetrics and Gynecology

## 2011-05-30 ENCOUNTER — Encounter: Payer: Self-pay | Admitting: Internal Medicine

## 2011-05-30 ENCOUNTER — Ambulatory Visit (INDEPENDENT_AMBULATORY_CARE_PROVIDER_SITE_OTHER): Payer: Medicare Other | Admitting: Internal Medicine

## 2011-05-30 ENCOUNTER — Other Ambulatory Visit (INDEPENDENT_AMBULATORY_CARE_PROVIDER_SITE_OTHER): Payer: Medicare Other

## 2011-05-30 VITALS — BP 120/90 | HR 92 | Temp 97.6°F | Ht 64.5 in | Wt 196.4 lb

## 2011-05-30 DIAGNOSIS — E039 Hypothyroidism, unspecified: Secondary | ICD-10-CM

## 2011-05-30 DIAGNOSIS — K1370 Unspecified lesions of oral mucosa: Secondary | ICD-10-CM | POA: Insufficient documentation

## 2011-05-30 DIAGNOSIS — E538 Deficiency of other specified B group vitamins: Secondary | ICD-10-CM

## 2011-05-30 DIAGNOSIS — J329 Chronic sinusitis, unspecified: Secondary | ICD-10-CM | POA: Insufficient documentation

## 2011-05-30 DIAGNOSIS — I10 Essential (primary) hypertension: Secondary | ICD-10-CM

## 2011-05-30 DIAGNOSIS — D649 Anemia, unspecified: Secondary | ICD-10-CM | POA: Insufficient documentation

## 2011-05-30 MED ORDER — FLUTICASONE PROPIONATE 50 MCG/ACT NA SUSP: 2.0000 | Freq: Every day | NASAL | Status: DC

## 2011-05-30 MED ORDER — FEXOFENADINE HCL 180 MG PO TABS: 180.0000 mg | ORAL_TABLET | Freq: Every day | ORAL | Status: DC

## 2011-05-30 MED ORDER — LOSARTAN POTASSIUM 100 MG PO TABS: 50.0000 mg | ORAL_TABLET | Freq: Every day | ORAL | Status: DC

## 2011-05-30 MED ORDER — DIPHENHYD-HYDROCORT-NYSTATIN MT SUSP: 5.0000 mL | Freq: Two times a day (BID) | OROMUCOSAL | Status: DC | PRN

## 2011-05-30 NOTE — Patient Instructions (Addendum)
Take all new medications as prescribed - the mouthwash, allegra and flonase Continue all other medications as before, including the losartan We will try to obtain more lab results from [facility] Please go to LAB in the Basement for the blood and/or urine tests to be done today - just the B12 level You will be contacted by phone if any changes need to be made immediately.  Otherwise, you will receive a letter about your results with an explanation. Please return in 6 months, or sooner if needed

## 2011-05-30 NOTE — Progress Notes (Signed)
Subjective:    Patient ID: [name], female    DOB: [birth year], 61 y.o.   MRN: [id number]  HPI  Here to f/u; overall doing ok,  Pt denies chest pain, increased sob or doe, wheezing, orthopnea, PND, increased LE swelling, palpitations, dizziness or syncope.  Pt denies new neurological symptoms such as new headache, or facial or extremity weakness or numbness   Pt denies polydipsia, polyuria, or low sugar symptoms such as weakness or confusion improved with po intake.  Pt states overall good compliance with meds, trying to follow lower cholesterol diet, wt overall stable but little exercise however.  Denies hyper or hypo thyroid symptoms such as voice, skin or hair change.  Had 2 deaths in the family, involved in the estate for one where the family is fighting over property;   Had influenza illness earlier this yr.  Did have labs may 2 at [facility] where she is followed for FMS;  More stress recently.  Denies worsening depressive symptoms, suicidal ideation, or panic, though has ongoing anxiety, has had some increased HA's recently, has been taking the losartan 100 mg - 1/2 per day.   Very concerned about being b12 deficient as she was tx with this for fatigue and thinks she just is not doing as well without it.  Has ongoing chroinc sinus congestion as well.  Has several whitish lesions to roof of the mouth for several wks. Past Medical History  Diagnosis Date  . Endometriosis   . Osteopenia   . Asthma   . Hypertension   . Fibromyalgia   . Thyroid disease     Hypothyroid  . Elevated cholesterol   . Migraines   . Bowel obstruction   . GERD (gastroesophageal reflux disease)   . Impaired glucose tolerance 02/25/2011   Past Surgical History  Procedure Date  . Tonsillectomy   . Pelvic laparoscopy 1995,2002    DL LSO DL RSO  . Vaginal hysterectomy 1983  . Oophorectomy     LSO-BSO  . Breast surgery     Benign breast cyst    reports that she has never smoked. She has never used  smokeless tobacco. She reports that she drinks about one ounce of alcohol per week. She reports that she does not use illicit drugs. family history includes Breast cancer in her maternal grandmother, mother, and paternal grandmother; Cancer in her mother; Colon cancer in her sister; Heart disease in her father and mother; Hypertension in her father and mother; Ovarian cancer in her paternal grandmother; and Sarcoidosis in her brother and sister. Allergies  Allergen Reactions  . Celecoxib   . Codeine     REACTION: Nausea  . Duloxetine     REACTION: n/v  . Erythromycin     REACTION: Itching  . Escitalopram Oxalate Nausea Only  . Ezetimibe   . Iohexol      Desc: VIOX, CELEBREX, EMYCIN   . Milnacipran     REACTION: n/v  . Pregabalin     REACTION: wt gain  . Rofecoxib   . Statins    Current Outpatient Prescriptions on File Prior to Visit  Medication Sig Dispense Refill  . albuterol (PROVENTIL) (2.5 MG/3ML) 0.083% nebulizer solution Take 2.5 mg by nebulization every 6 (six) hours as needed.      . Albuterol (VENTOLIN IN) Inhale into the lungs.        . Bisacodyl (LAXATIVE PO) Take by mouth as needed.      . Cholecalciferol (VITAMIN D PO)  Take 1,200 Units by mouth.       Marland Kitchen CLIMARA 0.075 MG/24HR PLACE 1 PATCH ONTO SKIN ONCE A WEEK  4 each  0  . diclofenac (FLECTOR) 1.3 % PTCH Place 1 patch onto the skin as needed.       . diclofenac sodium (VOLTAREN) 1 % GEL Apply topically as needed.       . dicyclomine (BENTYL) 10 MG capsule Take 1 capsule (10 mg total) by mouth 4 (four) times daily -  before meals and at bedtime. As needed  120 capsule  5  . fentaNYL (DURAGESIC - DOSED MCG/HR) 25 MCG/HR Place 1 patch onto the skin every 3 (three) days.        Marland Kitchen LACTULOSE PO Take by mouth as needed.       Marland Kitchen PANTOPRAZOLE SODIUM PO Take 1 tablet by mouth daily.       . polyethylene glycol powder (MIRALAX) powder Take 17 g by mouth as needed.      . thyroid (ARMOUR THYROID) 30 MG tablet Take 1 tablet (30  mg total) by mouth daily.  90 tablet  3  . TIZANIDINE HCL PO Take by mouth as needed.       . Topiramate (TOPIRAGEN PO) Take by mouth as needed.       Marland Kitchen UNABLE TO FIND as needed. C-PAP      . fexofenadine (ALLEGRA) 180 MG tablet Take 1 tablet (180 mg total) by mouth daily.  30 tablet  2  . fluticasone (FLONASE) 50 MCG/ACT nasal spray Place 2 sprays into the nose daily.  16 g  2  . losartan (COZAAR) 100 MG tablet Take 0.5 tablets (50 mg total) by mouth daily.  30 tablet  11   Review of Systems Review of Systems  Constitutional: Negative for diaphoresis and unexpected weight change. .   Eyes: Negative for photophobia and visual disturbance.  Respiratory: Negative for choking and stridor.   Gastrointestinal: Negative for vomiting and blood in stool.  Genitourinary: Negative for hematuria and decreased urine volume.  Musculoskeletal: Negative for gait problem.  Skin: Negative for color change and wound.  Neurological: Negative for tremors and numbness.  Psychiatric/Behavioral: Negative for decreased concentration. The patient is not hyperactive.       Objective:   Physical Exam BP 120/90  Pulse 92  Temp(Src) 97.6 F (36.4 C) (Oral)  Ht 5' 4.5" (1.638 m)  Wt 196 lb 6 oz (89.075 kg)  BMI 33.19 kg/m2  SpO2 99% Physical Exam  VS noted, not ill appaering Constitutional: Pt appears well-developed and well-nourished.  HENT: Head: Normocephalic.  Right Ear: External ear normal.  Left Ear: External ear normal.  Tongue without lesion or sweling, hard palate with several white spots Eyes: Conjunctivae and EOM are normal. Pupils are equal, round, and reactive to light.  Neck: Normal range of motion. Neck supple.  Cardiovascular: Normal rate and regular rhythm.   Pulmonary/Chest: Effort normal and breath sounds normal.  Abd:  Soft, NT, non-distended, + BS Neurological: Pt is alert. No cranial nerve deficit.  Skin: Skin is warm. No erythema.  Psychiatric: Pt behavior is normal. Thought  content normal. but irritable, mild dysphoric    Assessment & Plan:

## 2011-05-31 ENCOUNTER — Encounter: Payer: Self-pay | Admitting: Internal Medicine

## 2011-05-31 LAB — VITAMIN B12: Vitamin B-12: 543 pg/mL (ref 211–911)

## 2011-06-10 ENCOUNTER — Encounter: Payer: Self-pay | Admitting: Internal Medicine

## 2011-06-10 NOTE — Assessment & Plan Note (Signed)
Mild to mod, for magic mouthwash prn,  to f/u any worsening symptoms or concerns

## 2011-06-10 NOTE — Assessment & Plan Note (Signed)
stable overall by hx and exam, most recent data reviewed with pt, and pt to continue medical treatment as before Lab Results  Component Value Date   TSH 1.19 02/27/2011

## 2011-06-10 NOTE — Assessment & Plan Note (Signed)
stable overall by hx and exam, most recent data reviewed with pt, and pt to continue medical treatment as before BP Readings from Last 3 Encounters:  05/30/11 120/90  02/27/11 104/70  02/24/11 123/82

## 2011-06-10 NOTE — Assessment & Plan Note (Signed)
For allegra/flonase re-start,  to f/u any worsening symptoms or concerns

## 2011-06-12 ENCOUNTER — Telehealth: Payer: Self-pay | Admitting: *Deleted

## 2011-06-12 MED ORDER — ESTRADIOL 0.075 MG/24HR TD PTWK: 1.0000 | MEDICATED_PATCH | TRANSDERMAL | Status: DC

## 2011-06-12 NOTE — Telephone Encounter (Signed)
Pt called requesting refill on climara 0.075 mg patch, rx sent to pharmacy.

## 2011-08-05 ENCOUNTER — Other Ambulatory Visit: Payer: Self-pay | Admitting: Obstetrics and Gynecology

## 2011-09-22 ENCOUNTER — Ambulatory Visit (INDEPENDENT_AMBULATORY_CARE_PROVIDER_SITE_OTHER): Payer: Medicare Other | Admitting: Women's Health

## 2011-09-22 ENCOUNTER — Encounter: Payer: Self-pay | Admitting: Women's Health

## 2011-09-22 DIAGNOSIS — B373 Candidiasis of vulva and vagina: Secondary | ICD-10-CM

## 2011-09-22 DIAGNOSIS — A499 Bacterial infection, unspecified: Secondary | ICD-10-CM

## 2011-09-22 DIAGNOSIS — B9689 Other specified bacterial agents as the cause of diseases classified elsewhere: Secondary | ICD-10-CM

## 2011-09-22 DIAGNOSIS — N760 Acute vaginitis: Secondary | ICD-10-CM

## 2011-09-22 LAB — WET PREP FOR TRICH, YEAST, CLUE

## 2011-09-22 MED ORDER — METRONIDAZOLE 0.75 % VA GEL: VAGINAL | Status: AC

## 2011-09-22 NOTE — Progress Notes (Signed)
Patient ID: [name], female   DOB: 1950-11-11, 61 y.o.   MRN: [id number] Presents for a followup for right breast discoloration from infection and vaginal irritation  and left labial discomfort. Is not sexually active for many years. History of hysterectomy BSO. Denies any urinary symptoms or fever. Was treated with Keflex for 7 days 7 days and is using a topical steroid cream prescribed by primary care.  Exam: Right breast 1 cm superficial nonindurated brown flat discoloration noted at 2:00 position. Appears to be healing. Breast exam and sitting in lying position no dimpling retractions or palpable nodules. External genitalia within normal limits, speculum exam scant white discharge with slight odor wet prep positive for amines, clue cells and TNTC bacteria.  Healing right breast infection BV  Plan: MetroGel vaginal cream 1 applicator at bedtime x5, alcohol precautions reviewed. Encouraged using vitamin D capsule to healing right breast area to help with skin discoloration. Reviewed brown discoloration is slight scarring but infection has cleared.

## 2011-09-22 NOTE — Patient Instructions (Addendum)
Bacterial Vaginosis Bacterial vaginosis (BV) is a vaginal infection where the normal balance of bacteria in the vagina is disrupted. The normal balance is then replaced by an overgrowth of certain bacteria. There are several different kinds of bacteria that can cause BV. BV is the most common vaginal infection in women of childbearing age. CAUSES   The cause of BV is not fully understood. BV develops when there is an increase or imbalance of harmful bacteria.   Some activities or behaviors can upset the normal balance of bacteria in the vagina and put women at increased risk including:   Having a new sex partner or multiple sex partners.   Douching.   Using an intrauterine device (IUD) for contraception.   It is not clear what role sexual activity plays in the development of BV. However, women that have never had sexual intercourse are rarely infected with BV.  Women do not get BV from toilet seats, bedding, swimming pools or from touching objects around them.  SYMPTOMS   Grey vaginal discharge.   A fish-like odor with discharge, especially after sexual intercourse.   Itching or burning of the vagina and vulva.   Burning or pain with urination.   Some women have no signs or symptoms at all.  DIAGNOSIS  Your caregiver must examine the vagina for signs of BV. Your caregiver will perform lab tests and look at the sample of vaginal fluid through a microscope. They will look for bacteria and abnormal cells (clue cells), a pH test higher than 4.5, and a positive amine test all associated with BV.  RISKS AND COMPLICATIONS   Pelvic inflammatory disease (PID).   Infections following gynecology surgery.   Developing HIV.   Developing herpes virus.  TREATMENT  Sometimes BV will clear up without treatment. However, all women with symptoms of BV should be treated to avoid complications, especially if gynecology surgery is planned. Female partners generally do not need to be treated. However,  BV may spread between female sex partners so treatment is helpful in preventing a recurrence of BV.   BV may be treated with antibiotics. The antibiotics come in either pill or vaginal cream forms. Either can be used with nonpregnant or pregnant women, but the recommended dosages differ. These antibiotics are not harmful to the baby.   BV can recur after treatment. If this happens, a second round of antibiotics will often be prescribed.   Treatment is important for pregnant women. If not treated, BV can cause a premature delivery, especially for a pregnant woman who had a premature birth in the past. All pregnant women who have symptoms of BV should be checked and treated.   For chronic reoccurrence of BV, treatment with a type of prescribed gel vaginally twice a week is helpful.  HOME CARE INSTRUCTIONS   Finish all medication as directed by your caregiver.   Do not have sex until treatment is completed.   Tell your sexual partner that you have a vaginal infection. They should see their caregiver and be treated if they have problems, such as a mild rash or itching.   Practice safe sex. Use condoms. Only have 1 sex partner.  PREVENTION  Basic prevention steps can help reduce the risk of upsetting the natural balance of bacteria in the vagina and developing BV:  Do not have sexual intercourse (be abstinent).   Do not douche.   Use all of the medicine prescribed for treatment of BV, even if the signs and symptoms go away.     Tell your sex partner if you have BV. That way, they can be treated, if needed, to prevent reoccurrence.  SEEK MEDICAL CARE IF:   Your symptoms are not improving after 3 days of treatment.   You have increased discharge, pain, or fever.  MAKE SURE YOU:   Understand these instructions.   Will watch your condition.   Will get help right away if you are not doing well or get worse.  FOR MORE INFORMATION  Division of STD Prevention (DSTDP), Centers for Disease  Control and Prevention: www.cdc.gov/std American Social Health Association (ASHA): www.ashastd.org  Document Released: 01/09/2005 Document Revised: 12/29/2010 Document Reviewed: 07/02/2008 [employer] Patient Information 2012 [employer]. 

## 2011-09-29 ENCOUNTER — Ambulatory Visit (INDEPENDENT_AMBULATORY_CARE_PROVIDER_SITE_OTHER): Payer: Medicare Other | Admitting: Obstetrics and Gynecology

## 2011-09-29 DIAGNOSIS — A499 Bacterial infection, unspecified: Secondary | ICD-10-CM

## 2011-09-29 DIAGNOSIS — N760 Acute vaginitis: Secondary | ICD-10-CM

## 2011-09-29 NOTE — Progress Notes (Signed)
Patient came back today to see me because she was concerned after her visit with Maryelizabeth Rowan. She was diagnosed with bacterial vaginosis and treated with MetroGel. She has had several skin lesions on her breast and  her right arm and she wondered if she did not have something systemic. The ones on her arm are almost gone and the one on the right breast is going away very slowly and has been there for one month. She was however mostly disturbed in reading her avs that the bacterial vaginal infection was due 2 a sexually transmitted disease. She has not been sexually active for 13 years.  The lesion on her right breast appears innocent to me. It is brown. Is not raised. It has not bled. There is no black pigmentation.  Assessment: Bacterial vaginosis.  Plan: I reassured her that the skin lesions are not related to a vaginal infection. I have asked her to show them to her dermatologist. I explained to her that bacterial vaginosis is frequently not sexually related. She will start boric acid 600 mg vaginal capsules 3 times a week 2 after her pH. We will recheck her in November

## 2011-10-13 ENCOUNTER — Telehealth: Payer: Self-pay | Admitting: Internal Medicine

## 2011-10-13 NOTE — Telephone Encounter (Signed)
Caller: Carena/Patient; Patient Name: Silva Bandy; PCP: Oliver Barre (Adults only); Best Callback Phone Number: [phone] This morning she had episode of weakness, nausea, and sweaty and felt like she was going to pass out. Same episode happened last week. Checked today at Dermatologist office and BP=126/66. She has been feeling fatigued since stopping Vit B 12 injections. Started Vit B12 OTC not really helping. She had been waking up feeling tightness in chest and throat, thinks something is wrong with Thyroid or having low blood sugar problems. Sometimes feels like heart is racing or beating slow and hard. She is supposed to wear Cpap at night but can't stand wearing mask on face. Getting headaches daily. She feels like she has lump in throat. She is thirsty all the time, frequent urination for past 2 months-urine looked cloudy yesterday at [facility]. Prescribed Detrol LA but is going to wait to take it until checked by PCP. Triage and Care advice per Nausea or Vomiting and Irregular Heartrate Protocols and appnt advised within 24 hours for "decrease in activity or exercise ability AND ongoing or repeated episodes of irregular pulse or pulse rate more than 90 beats/minute at rest." Appointment scheduled for 0945 -10/14/11

## 2011-10-14 ENCOUNTER — Ambulatory Visit (INDEPENDENT_AMBULATORY_CARE_PROVIDER_SITE_OTHER): Payer: Medicare Other | Admitting: Internal Medicine

## 2011-10-14 ENCOUNTER — Encounter: Payer: Self-pay | Admitting: Internal Medicine

## 2011-10-14 VITALS — BP 126/82 | HR 62 | Ht 64.5 in | Wt 213.0 lb

## 2011-10-14 DIAGNOSIS — R42 Dizziness and giddiness: Secondary | ICD-10-CM

## 2011-10-14 DIAGNOSIS — R55 Syncope and collapse: Secondary | ICD-10-CM

## 2011-10-14 DIAGNOSIS — IMO0001 Reserved for inherently not codable concepts without codable children: Secondary | ICD-10-CM

## 2011-10-14 DIAGNOSIS — Z79899 Other long term (current) drug therapy: Secondary | ICD-10-CM

## 2011-10-14 DIAGNOSIS — T148XXA Other injury of unspecified body region, initial encounter: Secondary | ICD-10-CM

## 2011-10-14 DIAGNOSIS — E049 Nontoxic goiter, unspecified: Secondary | ICD-10-CM

## 2011-10-14 LAB — POCT URINALYSIS DIPSTICK
Bilirubin, UA: NEGATIVE
Blood, UA: NEGATIVE
Glucose, UA: NEGATIVE
Ketones, UA: NEGATIVE
Spec Grav, UA: <=1.005

## 2011-10-14 MED ORDER — LOSARTAN POTASSIUM 50 MG PO TABS: 25.0000 mg | ORAL_TABLET | Freq: Every day | ORAL | Status: DC

## 2011-10-14 NOTE — Progress Notes (Signed)
Subjective:    Patient ID: [name], female    DOB: Jun 14, 1950, 61 y.o.   MRN: [id number]  HPI Patient comes in today for SDA for  new problem evaluation. Saturday  clinic  Send in by CAN?  Last pcp check in [place] . Has multiple specialist and meds  But no  Sig change recently  On way to see Dr Yetta Barre derm advised by gyne.    After a bath and dressed ; broke out in sweat and felt dizzy like wanting to pass out and vomiting .   And laid down about 11 am .  For  Then went to appt and felt woozy so and was felt dizzy and  bp   Showed 120/66 range  .  Also has seen FM doctor and 126/76 both times may not have eaten .  No vertigo change in vision hearing or focal signs  Each of these episodes improve with rest fluids and food.  No new meds   Except topicals and vag  No hx of fainting as a child  Review of Systems Neg cp sob palpitations racing before this  Uri fever chills uti sx  Rest as per hpi Outpatient Encounter Prescriptions as of 10/14/2011  Medication Sig Dispense Refill  . albuterol (PROVENTIL) (2.5 MG/3ML) 0.083% nebulizer solution Take 2.5 mg by nebulization every 6 (six) hours as needed.      . Albuterol (VENTOLIN IN) Inhale into the lungs.        . Bisacodyl (LAXATIVE PO) Take by mouth as needed.      . cephALEXin (KEFLEX) 500 MG capsule Take 500 mg by mouth 3 (three) times daily.      . Cholecalciferol (VITAMIN D PO) Take 1,200 Units by mouth.       . diclofenac (FLECTOR) 1.3 % PTCH Place 1 patch onto the skin as needed.       . diclofenac sodium (VOLTAREN) 1 % GEL Apply topically as needed.       . dicyclomine (BENTYL) 10 MG capsule Take 1 capsule (10 mg total) by mouth 4 (four) times daily -  before meals and at bedtime. As needed  120 capsule  5  . Diphenhyd-Hydrocort-Nystatin SUSP Use as directed 5 mLs in the mouth or throat 2 (two) times daily as needed.  140 mL  3  . estradiol (CLIMARA) 0.075 mg/24hr Place 1 patch (0.075 mg total) onto the skin once a week.  4 patch  6  .  fentaNYL (DURAGESIC - DOSED MCG/HR) 25 MCG/HR Place 1 patch onto the skin every 3 (three) days.        . fexofenadine (ALLEGRA) 180 MG tablet Take 1 tablet (180 mg total) by mouth daily.  30 tablet  2  . fluticasone (FLONASE) 50 MCG/ACT nasal spray Place 2 sprays into the nose daily.  16 g  2  . hydrocortisone butyrate (LUCOID) 0.1 % CREA cream Apply topically 2 (two) times daily.      Marland Kitchen LACTULOSE PO Take by mouth as needed.       Marland Kitchen losartan (COZAAR) 100 MG tablet Take 0.5 tablets (50 mg total) by mouth daily.  30 tablet  11  . PANTOPRAZOLE SODIUM PO Take 1 tablet by mouth daily.       . polyethylene glycol powder (MIRALAX) powder Take 17 g by mouth as needed.      . thyroid (ARMOUR THYROID) 30 MG tablet Take 1 tablet (30 mg total) by mouth daily.  90 tablet  3  . TIZANIDINE HCL PO Take by mouth as needed.       . Topiramate (TOPIRAGEN PO) Take by mouth as needed.       Marland Kitchen UNABLE TO FIND as needed. C-PAP      Past history family history social history reviewed in the electronic medical record.      Objective:   Physical Exam BP 126/82  Pulse 62  Ht 5' 4.5" (1.638 m)  Wt 213 lb (96.616 kg)  BMI 36.00 kg/m2  SpO2 98% WDWN in nad looks well and non toxic  HEENT: Normocephalic ;atraumatic , Eyes;  PERRL, EOMs  Full, lids and conjunctiva clear,,Ears: no deformities, canals nl, TM landmarks normal, Nose: no deformity or discharge  Mouth : OP clear without lesion or edema . Neck: Supple without adenopathy or masses or bruits thyroid palpable  Chest:  Clear to A&P without wheezes rales or rhonchi CV:  S1-S2 no gallops or murmurs peripheral perfusion is normal Abdomen:  Sof,t normal bowel sounds without hepatosplenomegaly, no guarding rebound or masses no CVA tenderness points ot right flank as area of soreness no rash No clubbing cyanosis slight  Edema Skin: normal capillary refill ,turgor , color: No acute rashes ,petechiae    Bruise right lower leg and right upper arm . None on trunk  EKG  nsr brady  ua clear  dbg 78 post prandial     Assessment & Plan:  Episodes of nausea dizziness    Sounds vagal ?  Doesn't sound cardiac  No obv acute neuro issue Poss from meds or other dx   Suggest med review and check labs. As per PCP  In fu from sat clinic.  Consider pre diabetes   With context and  fam hx ;  If bp on low side can  decrease losartan to 25 mg and fu with PCP    Bruising  Ext not severe or otherwise alarming but needs lab and work up   Fm . Thyroid idsease nodule?  On many meds    including pain meds  But no change per pt.

## 2011-10-14 NOTE — Patient Instructions (Addendum)
This doesn't seem like a heart problem but a vagal fainting type reaction that can be from medication  Or skipping meals  Also consider a  Blood sugar issue.Like prediabetes.  Need appt with PCP and have him  evaluate further.  In the meantime eat small amounts of food frequently and avoid sugars and sweets.   Decrease the losartan to 25 mg per day   For now  . Make appt with dr Jonny Ruiz in the next 2 weeks

## 2011-10-27 ENCOUNTER — Encounter: Payer: Self-pay | Admitting: Internal Medicine

## 2011-10-27 ENCOUNTER — Other Ambulatory Visit (INDEPENDENT_AMBULATORY_CARE_PROVIDER_SITE_OTHER): Payer: Medicare Other

## 2011-10-27 ENCOUNTER — Ambulatory Visit (INDEPENDENT_AMBULATORY_CARE_PROVIDER_SITE_OTHER): Payer: Medicare Other | Admitting: Internal Medicine

## 2011-10-27 VITALS — BP 104/78 | HR 72 | Temp 97.1°F | Ht 64.5 in | Wt 210.4 lb

## 2011-10-27 DIAGNOSIS — M549 Dorsalgia, unspecified: Secondary | ICD-10-CM

## 2011-10-27 DIAGNOSIS — R7302 Impaired glucose tolerance (oral): Secondary | ICD-10-CM

## 2011-10-27 DIAGNOSIS — R109 Unspecified abdominal pain: Secondary | ICD-10-CM

## 2011-10-27 DIAGNOSIS — R1010 Upper abdominal pain, unspecified: Secondary | ICD-10-CM

## 2011-10-27 DIAGNOSIS — R7309 Other abnormal glucose: Secondary | ICD-10-CM

## 2011-10-27 DIAGNOSIS — R5383 Other fatigue: Secondary | ICD-10-CM

## 2011-10-27 DIAGNOSIS — Z23 Encounter for immunization: Secondary | ICD-10-CM

## 2011-10-27 DIAGNOSIS — I10 Essential (primary) hypertension: Secondary | ICD-10-CM

## 2011-10-27 DIAGNOSIS — R21 Rash and other nonspecific skin eruption: Secondary | ICD-10-CM

## 2011-10-27 DIAGNOSIS — E785 Hyperlipidemia, unspecified: Secondary | ICD-10-CM

## 2011-10-27 LAB — URINALYSIS, ROUTINE W REFLEX MICROSCOPIC
Hgb urine dipstick: NEGATIVE
Nitrite: NEGATIVE
Specific Gravity, Urine: 1.015 (ref 1.000–1.030)
Urine Glucose: NEGATIVE
Urobilinogen, UA: 0.2 (ref 0.0–1.0)

## 2011-10-27 LAB — BASIC METABOLIC PANEL
BUN: 17 mg/dL (ref 6–23)
CO2: 29 mEq/L (ref 19–32)
Calcium: 8.9 mg/dL (ref 8.4–10.5)
GFR: 71.34 mL/min (ref >60.00)
Glucose, Bld: 86 mg/dL (ref 70–99)
Potassium: 3.7 mEq/L (ref 3.5–5.1)
Sodium: 140 mEq/L (ref 135–145)

## 2011-10-27 LAB — CBC WITH DIFFERENTIAL/PLATELET
Basophils Relative: 0.3 % (ref 0.0–3.0)
Eosinophils Absolute: 0.1 10*3/uL (ref 0.0–0.7)
Lymphs Abs: 1.9 10*3/uL (ref 0.7–4.0)
Monocytes Absolute: 0.4 10*3/uL (ref 0.1–1.0)
Neutro Abs: 4.3 10*3/uL (ref 1.4–7.7)
Neutrophils Relative %: 64.1 % (ref 43.0–77.0)
WBC: 6.7 10*3/uL (ref 4.5–10.5)

## 2011-10-27 LAB — LIPID PANEL
Cholesterol: 159 mg/dL (ref 0–200)
HDL: 53.8 mg/dL (ref >39.00)
VLDL: 12.4 mg/dL (ref 0.0–40.0)

## 2011-10-27 LAB — HEMOGLOBIN A1C: Hgb A1c MFr Bld: 5.2 % (ref 4.6–6.5)

## 2011-10-27 LAB — HEPATIC FUNCTION PANEL
AST: 14 U/L (ref 0–37)
Albumin: 3.4 g/dL — ABNORMAL LOW (ref 3.5–5.2)
Alkaline Phosphatase: 47 U/L (ref 39–117)
Total Protein: 7.2 g/dL (ref 6.0–8.3)

## 2011-10-27 MED ORDER — CYCLOBENZAPRINE HCL 5 MG PO TABS: 5.0000 mg | ORAL_TABLET | Freq: Three times a day (TID) | ORAL | Status: DC | PRN

## 2011-10-27 NOTE — Assessment & Plan Note (Signed)
?   overcontrolled - ok to d/c the losartan completely with low SBP today to hopefully help any dizziness due to low BP  BP Readings from Last 3 Encounters:  10/27/11 104/78  10/14/11 126/82  05/30/11 120/90

## 2011-10-27 NOTE — Assessment & Plan Note (Signed)
Pt states recurring spells may be due to low sugar, to that end encouraged small meals mult times per day but I suspect this not likely to be the case Lab Results  Component Value Date   HGBA1C 5.2 10/27/2011

## 2011-10-27 NOTE — Assessment & Plan Note (Signed)
Ok for flexeril prn for msk spasm

## 2011-10-27 NOTE — Assessment & Plan Note (Signed)
prob allergic, ok to cont the hydrocort cream she has, consider allergy referral

## 2011-10-27 NOTE — Assessment & Plan Note (Signed)
I suspect constipation but cant r/o gastritis/GB; for labs, and u/s abd  Note:  Total time for pt hx, exam, review of record with pt in the room, determination of diagnoses and plan for further eval and tx is > 40 min, with over 50% spent in coordination and counseling of patient

## 2011-10-27 NOTE — Progress Notes (Signed)
Subjective:    Patient ID: [name], female    DOB: [birth year], 61 y.o.   MRN: [id number]  HPI  Here to f/u with numerous complaints, vague to me -   Had seen Dr Fabian Sharp about 2 wks ago - ? BP too low, BS too low so BP med reduced per Dr Fabian Sharp.  Had episode of nausea/lightheaded in a cell phone store, sat with water for 1 hr, better and went home, hadnt eaten well that day.  Has seen her [facility] doctor, with DBP about 64 she recalls the day before the episode.    BP still overall lower today.  Does have sense of ongoing fatigue, but denies signficant hypersomnolence.   C/o ? Hive like rash to legs that itch and turn to sores.  Also with many ROS + including abd bloating, cramping, fatigue, diffuse back pain, thirsty, ? Increased urination, wt gain, recurrent yeast infection like symtpoms.   Pt denies fever, wt loss, night sweats, loss of appetite, or other constitutional symptoms  Denies worsening depressive symptoms, suicidal ideation, or panic, though has ongoing anxiety.  Pt denies chest pain, increased sob or doe, wheezing, orthopnea, PND, increased LE swelling, palpitations.  Pt denies new neurological symptoms such as new headache, or facial or extremity weakness or numbness   Current Outpatient Prescriptions on File Prior to Visit  Medication Sig Dispense Refill  . albuterol (PROVENTIL) (2.5 MG/3ML) 0.083% nebulizer solution Take 2.5 mg by nebulization every 6 (six) hours as needed.      . Albuterol (VENTOLIN IN) Inhale into the lungs.        . Bisacodyl (LAXATIVE PO) Take by mouth as needed.      . cephALEXin (KEFLEX) 500 MG capsule Take 500 mg by mouth 3 (three) times daily.      . Cholecalciferol (VITAMIN D PO) Take 1,200 Units by mouth.       . diclofenac (FLECTOR) 1.3 % PTCH Place 1 patch onto the skin as needed.       . diclofenac sodium (VOLTAREN) 1 % GEL Apply topically as needed.       . dicyclomine (BENTYL) 10 MG capsule Take 1 capsule (10 mg total) by mouth 4 (four) times daily -   before meals and at bedtime. As needed  120 capsule  5  . Diphenhyd-Hydrocort-Nystatin SUSP Use as directed 5 mLs in the mouth or throat 2 (two) times daily as needed.  140 mL  3  . estradiol (CLIMARA) 0.075 mg/24hr Place 1 patch (0.075 mg total) onto the skin once a week.  4 patch  6  . fentaNYL (DURAGESIC - DOSED MCG/HR) 25 MCG/HR Place 1 patch onto the skin every 3 (three) days.        . fexofenadine (ALLEGRA) 180 MG tablet Take 1 tablet (180 mg total) by mouth daily.  30 tablet  2  . fluticasone (FLONASE) 50 MCG/ACT nasal spray Place 2 sprays into the nose daily.  16 g  2  . hydrocortisone butyrate (LUCOID) 0.1 % CREA cream Apply topically 2 (two) times daily.      Marland Kitchen LACTULOSE PO Take by mouth as needed.       Marland Kitchen losartan (COZAAR) 100 MG tablet Take 0.5 tablets (50 mg total) by mouth daily.  30 tablet  11  . losartan (COZAAR) 50 MG tablet Take 0.5 tablets (25 mg total) by mouth daily.  30 tablet  1  . PANTOPRAZOLE SODIUM PO Take 1 tablet by mouth daily.       Marland Kitchen  polyethylene glycol powder (MIRALAX) powder Take 17 g by mouth as needed.      . thyroid (ARMOUR THYROID) 30 MG tablet Take 1 tablet (30 mg total) by mouth daily.  90 tablet  3  . TIZANIDINE HCL PO Take by mouth as needed.       . Topiramate (TOPIRAGEN PO) Take by mouth as needed.       Marland Kitchen UNABLE TO FIND as needed. C-PAP       Review of Systems  Constitutional: Negative for diaphoresis and unexpected weight change.  HENT: Negative for tinnitus.   Eyes: Negative for photophobia and visual disturbance.  Respiratory: Negative for choking and stridor.   Gastrointestinal: Negative for vomiting and blood in stool.  Genitourinary: Negative for hematuria and decreased urine volume.  Musculoskeletal: Negative for gait problem.  Skin: Negative for color change and wound.  Neurological: Negative for tremors and numbness.  Psychiatric/Behavioral: Negative for decreased concentration. The patient is not hyperactive.       Objective:    Physical Exam BP 104/78  Pulse 72  Temp 97.1 F (36.2 C) (Oral)  Ht 5' 4.5" (1.638 m)  Wt 210 lb 6 oz (95.425 kg)  BMI 35.55 kg/m2  SpO2 98% Physical Exam  VS noted Constitutional: Pt appears well-developed and well-nourished.  HENT: Head: Normocephalic.  Right Ear: External ear normal.  Left Ear: External ear normal.  Eyes: Conjunctivae and EOM are normal. Pupils are equal, round, and reactive to light.  Neck: Normal range of motion. Neck supple.  Cardiovascular: Normal rate and regular rhythm.   Pulmonary/Chest: Effort normal and breath sounds normal.  Abd:  Soft, diffuse tender, ? Mild distended/firmness, + BS Neurological: Pt is alert. Not confused , motor/dtr/gait intact Skin: Skin is warm. Has several erythema spots (approx 10 overall) nontender, slight raised, 5-8 mm, itchy to extremities Psychiatric: Pt behavior is normal. Thought content normal, tense demeanor, 1+ nervous  Right lumbar paravertebral spasm/tender noted    Assessment & Plan:

## 2011-10-27 NOTE — Patient Instructions (Addendum)
You had the flu shot today OK to stop the losartan completely for now Take all new medications as prescribed - the flexeril for muscle relaxer Please go to LAB in the Basement for the blood and/or urine tests to be done today You will be contacted by phone if any changes need to be made immediately.  Otherwise, you will receive a letter about your results with an explanation. Please remember to sign up for My Chart at your earliest convenience, as this will be important to you in the future with finding out test results. You will be contacted regarding the referral for: abdomen ultrasound

## 2011-11-01 ENCOUNTER — Other Ambulatory Visit: Payer: Medicare Other

## 2011-11-02 ENCOUNTER — Encounter: Payer: Self-pay | Admitting: Internal Medicine

## 2011-11-02 ENCOUNTER — Emergency Department (HOSPITAL_COMMUNITY): Payer: Medicare Other

## 2011-11-02 ENCOUNTER — Telehealth: Payer: Self-pay | Admitting: Internal Medicine

## 2011-11-02 ENCOUNTER — Ambulatory Visit
Admission: RE | Admit: 2011-11-02 | Discharge: 2011-11-02 | Disposition: A | Discharge status: Home / Self Care | Payer: Medicare Other | Source: Ambulatory Visit | Attending: Internal Medicine | Admitting: Internal Medicine

## 2011-11-02 ENCOUNTER — Observation Stay (HOSPITAL_COMMUNITY)
Admission: EM | Admit: 2011-11-02 | Discharge: 2011-11-03 | Disposition: A | Discharge status: Home / Self Care | Payer: Medicare Other | Attending: Emergency Medicine | Admitting: Emergency Medicine

## 2011-11-02 ENCOUNTER — Encounter (HOSPITAL_COMMUNITY): Payer: Self-pay | Admitting: *Deleted

## 2011-11-02 DIAGNOSIS — R109 Unspecified abdominal pain: Secondary | ICD-10-CM | POA: Insufficient documentation

## 2011-11-02 DIAGNOSIS — K219 Gastro-esophageal reflux disease without esophagitis: Secondary | ICD-10-CM | POA: Insufficient documentation

## 2011-11-02 DIAGNOSIS — R932 Abnormal findings on diagnostic imaging of liver and biliary tract: Secondary | ICD-10-CM

## 2011-11-02 DIAGNOSIS — M545 Low back pain: Secondary | ICD-10-CM

## 2011-11-02 DIAGNOSIS — R1010 Upper abdominal pain, unspecified: Secondary | ICD-10-CM

## 2011-11-02 DIAGNOSIS — M5416 Radiculopathy, lumbar region: Secondary | ICD-10-CM

## 2011-11-02 DIAGNOSIS — IMO0002 Reserved for concepts with insufficient information to code with codable children: Principal | ICD-10-CM | POA: Insufficient documentation

## 2011-11-02 DIAGNOSIS — I10 Essential (primary) hypertension: Secondary | ICD-10-CM | POA: Insufficient documentation

## 2011-11-02 DIAGNOSIS — E7800 Pure hypercholesterolemia, unspecified: Secondary | ICD-10-CM | POA: Insufficient documentation

## 2011-11-02 DIAGNOSIS — R7309 Other abnormal glucose: Secondary | ICD-10-CM | POA: Insufficient documentation

## 2011-11-02 DIAGNOSIS — IMO0001 Reserved for inherently not codable concepts without codable children: Secondary | ICD-10-CM | POA: Insufficient documentation

## 2011-11-02 LAB — URINALYSIS, ROUTINE W REFLEX MICROSCOPIC
Bilirubin Urine: NEGATIVE
Glucose, UA: NEGATIVE mg/dL
Hgb urine dipstick: NEGATIVE
Ketones, ur: NEGATIVE mg/dL
Protein, ur: NEGATIVE mg/dL

## 2011-11-02 MED ORDER — DIAZEPAM 5 MG PO TABS
5.0000 mg | ORAL_TABLET | Freq: Once | ORAL | Status: AC
  Administered 2011-11-02: 5 mg via ORAL
  Filled 2011-11-02: qty 1

## 2011-11-02 MED ORDER — HYDROMORPHONE HCL PF 1 MG/ML IJ SOLN
1.0000 mg | Freq: Once | INTRAMUSCULAR | Status: AC
  Administered 2011-11-02: 1 mg via INTRAVENOUS
  Filled 2011-11-02: qty 1

## 2011-11-02 MED ORDER — ONDANSETRON HCL 4 MG/2ML IJ SOLN
4.0000 mg | Freq: Once | INTRAMUSCULAR | Status: AC
  Administered 2011-11-02: 4 mg via INTRAVENOUS
  Filled 2011-11-02: qty 2

## 2011-11-02 MED ORDER — ONDANSETRON HCL 4 MG/2ML IJ SOLN
4.0000 mg | Freq: Four times a day (QID) | INTRAMUSCULAR | Status: DC | PRN
  Administered 2011-11-02 – 2011-11-03 (×2): 4 mg via INTRAVENOUS
  Filled 2011-11-02 (×2): qty 2

## 2011-11-02 MED ORDER — DIAZEPAM 5 MG PO TABS
5.0000 mg | ORAL_TABLET | Freq: Four times a day (QID) | ORAL | Status: DC | PRN
  Administered 2011-11-03: 5 mg via ORAL
  Filled 2011-11-02 (×2): qty 1

## 2011-11-02 MED ORDER — HYDROMORPHONE HCL PF 1 MG/ML IJ SOLN
1.0000 mg | INTRAMUSCULAR | Status: DC | PRN
  Administered 2011-11-02 – 2011-11-03 (×3): 1 mg via INTRAVENOUS
  Filled 2011-11-02 (×4): qty 1

## 2011-11-02 NOTE — Telephone Encounter (Signed)
Ok to cont with current plan, but I will refer also to orthopedic (urgent)

## 2011-11-02 NOTE — ED Notes (Signed)
Ambulatory to bathroom, steady gait.

## 2011-11-02 NOTE — Telephone Encounter (Signed)
See other note;  But GB was some abnormal appearing on the ultrasound, and is ? Source of pain -   For General Surgury referral as well

## 2011-11-02 NOTE — ED Notes (Signed)
Regular diet ordered for patient. 

## 2011-11-02 NOTE — ED Notes (Signed)
Patient transported to X-ray 

## 2011-11-02 NOTE — ED Provider Notes (Signed)
Patient in CDU under back pain protocol.  Has been ambulatory to the bathroom without difficulty.  Patient continues to report frequent return of pain.  Patient is not opiate naive. May require more aggressive dosing on a regular 2 hour schedule versus prn.  12:11 AM Patient signed out to Dr. Lorenso Courier.  Jimmye Norman, NP 11/03/11 [phone]

## 2011-11-02 NOTE — ED Notes (Signed)
Bed not available in CDU at present.

## 2011-11-02 NOTE — Telephone Encounter (Signed)
Patient calling, she was seen in the office Friday, 10/4 for back pain.  Was started on Flexeril and the pain is worse.  She cancelled the [place] appt. for yesterday and rescheduled for this morning.  The pain is severe and she cannot sit or lie down. The back pain is radiating into her lower abdomen, groin/hips and legs.   LMP hysterectomy.  She is asking if any additional testing needs to be done while she is having the ultrasouond done today?   Triaged per Back Pain.

## 2011-11-02 NOTE — ED Notes (Signed)
Patient states back pain x 1 week, patient radiating into lower back and into legs, patient states painful to sit down

## 2011-11-02 NOTE — ED Provider Notes (Signed)
History     CSN: [id number]  Arrival date & time 11/02/11  1201   First MD Initiated Contact with Patient 11/02/11 1320      Chief Complaint  Patient presents with  . Back Pain    (Consider location/radiation/quality/duration/timing/severity/associated sxs/prior treatment) HPI Comments: Patient with h/o fibromyalgia, low back pain due to 'disc problem at L5/S1' -- presents with one week of gradually worsening low back pain that radiates into bilateral LE, R>L. Patient states it hurts regardless of position, more with sitting. Pain is same as previous however it is more severe and described as throbbing. Typically pain will resolve within 5 days but this has not. No urinary retention or fecal incontinence. No other red flags of low back pain other than age >30. No numbness, tingling in lower extremities. Patient saw PCP for this complaint 7 days ago, was prescribed flexeril, which has not helped. No injury at onset.   The history is provided by the patient.    Past Medical History  Diagnosis Date  . Endometriosis   . Osteopenia   . Asthma   . Hypertension   . Fibromyalgia   . Thyroid disease     Hypothyroid  . Elevated cholesterol   . Migraines   . Bowel obstruction   . GERD (gastroesophageal reflux disease)   . Impaired glucose tolerance 02/25/2011    Past Surgical History  Procedure Date  . Tonsillectomy   . Pelvic laparoscopy 1995,2002    DL LSO DL RSO  . Vaginal hysterectomy 1983  . Oophorectomy     LSO-BSO  . Breast surgery     Benign breast cyst    Family History  Problem Relation Age of Onset  . Hypertension Mother   . Heart disease Mother   . Breast cancer Mother   . Cancer Mother     Bonecancer  . Hypertension Father   . Heart disease Father   . Colon cancer Sister   . Sarcoidosis Sister   . Breast cancer Maternal Grandmother   . Breast cancer Paternal Grandmother   . Ovarian cancer Paternal Grandmother   . Sarcoidosis Brother     History    Substance Use Topics  . Smoking status: Never Smoker   . Smokeless tobacco: Never Used  . Alcohol Use: 1.0 oz/week    2 drink(s) per week     socially    OB History    Grav Para Term Preterm Abortions TAB SAB Ect Mult Living   3 2 2  1     2       Review of Systems  Constitutional: Negative for fever and unexpected weight change.  Gastrointestinal: Negative for constipation.       Negative for fecal incontinence.   Genitourinary: Negative for dysuria, hematuria, flank pain, vaginal bleeding, vaginal discharge and pelvic pain.       Negative for urinary incontinence or retention.  Musculoskeletal: Positive for back pain.  Neurological: Negative for weakness and numbness.       Denies saddle paresthesias.    Allergies  Celecoxib; Codeine; Duloxetine; Erythromycin; Escitalopram oxalate; Ezetimibe; Iohexol; Milnacipran; Pregabalin; Rofecoxib; and Statins  Home Medications   Current Outpatient Rx  Name Route Sig Dispense Refill  . ALBUTEROL SULFATE HFA 108 (90 BASE) MCG/ACT IN AERS Inhalation Inhale 2 puffs into the lungs every 6 (six) hours as needed. For wheezing    . ALBUTEROL SULFATE (2.5 MG/3ML) 0.083% IN NEBU Nebulization Take 2.5 mg by nebulization every 6 (six) hours as needed.  For wheezing    . VITAMIN D PO Oral Take 1,200 Units by mouth daily.     . CYCLOBENZAPRINE HCL 5 MG PO TABS Oral Take 1 tablet (5 mg total) by mouth 3 (three) times daily as needed for muscle spasms. 90 tablet 1  . DICLOFENAC SODIUM 1 % TD GEL Topical Apply 2 g topically as needed. For pain    . DICYCLOMINE HCL 10 MG PO CAPS Oral Take 1 capsule (10 mg total) by mouth 4 (four) times daily -  before meals and at bedtime. As needed 120 capsule 5  . ESTRADIOL 0.075 MG/24HR TD PTWK Transdermal Place 1 patch (0.075 mg total) onto the skin once a week. 4 patch 6  . FENTANYL 25 MCG/HR TD PT72 Transdermal Place 1 patch onto the skin every 3 (three) days.      Marland Kitchen FLUTICASONE PROPIONATE 50 MCG/ACT NA SUSP Nasal  Place 2 sprays into the nose daily. 16 g 2  . HYDROCORTISONE BUTYRATE 0.1 % EX CREA Topical Apply topically 2 (two) times daily.    Marland Kitchen HYDROQUINONE 4 % EX CREA Topical Apply 1 application topically 2 (two) times daily.    Marland Kitchen LOSARTAN POTASSIUM 100 MG PO TABS Oral Take 100 mg by mouth daily.    Marland Kitchen METOPROLOL SUCCINATE ER 100 MG PO TB24 Oral Take 100 mg by mouth daily. Take with or immediately following a meal.    . PITAVASTATIN CALCIUM 4 MG PO TABS Oral Take 1 tablet by mouth daily.    Marland Kitchen POLYETHYLENE GLYCOL 3350 PO POWD Oral Take 17 g by mouth as needed. For constipation    . THYROID 30 MG PO TABS Oral Take 1 tablet (30 mg total) by mouth daily. 90 tablet 3  . THYROID 30 MG PO TABS Oral Take 30 mg by mouth daily.    . TOPIRAMATE 100 MG PO TABS Oral Take 100 mg by mouth 2 (two) times daily.      BP 132/63  Pulse 63  Temp 97.8 F (36.6 C) (Oral)  Resp 18  SpO2 100%  Physical Exam  Nursing note and vitals reviewed. Constitutional: She is oriented to person, place, and time. She appears well-developed and well-nourished.  HENT:  Head: Normocephalic and atraumatic.  Eyes: Conjunctivae normal are normal.  Neck: Normal range of motion. Neck supple.  Pulmonary/Chest: Effort normal.  Abdominal: Soft. There is tenderness. There is no CVA tenderness.       Generalized, recent US for this done today  Musculoskeletal: Normal range of motion.       Cervical back: She exhibits no tenderness and no bony tenderness.       Thoracic back: She exhibits no tenderness and no bony tenderness.       Lumbar back: She exhibits tenderness. She exhibits no bony tenderness.       Back:       There is no tenderness to palpation over cervical/thoracic/lumbar/sacral spine. Tenderness to palpation over cervical/thoracic/lumbar paraspinal muscles. No step-off noted with palpation of spine.   Neurological: She is alert and oriented to person, place, and time. She has normal strength and normal reflexes. No sensory  deficit.       5/5 strength in entire lower extremities bilaterally. No sensation deficit.   Skin: Skin is warm and dry. No rash noted.  Psychiatric: She has a normal mood and affect.    ED Course  Procedures (including critical care time)  Labs Reviewed - No data to display US Abdomen Complete  11/02/2011  Abdominal ultrasound  History:  Upper abdominal discomfort  Findings:  Gallbladder is visualized in multiple projections.  No gallstones are seen.  The gallbladder wall is not thickened, and there is no pericholecystic fluid.  There are, however, several foci of increased echogenicity with comet-tail type artifact suggesting localized inflammatory change along the gallbladder wall.  There is no intrahepatic, common hepatic, or common bile duct dilatation.  Pancreas is partially obscured by gas.  Visualized portions of pancreas are within normal limits.  No focal liver lesions are identified.  Spleen is normal in size and homogeneous in echogenicity.  Flank kidneys appear normal bilaterally.  There is no ascites. There is mild atherosclerotic change in the aorta but no aneurysm. Inferior vena cava appears normal.  Conclusion:  Several areas of echogenicity are noted along the gallbladder wall with comet tail type artifact.  This finding is consistent with inflammatory change in the gallbladder wall.  The most likely etiology for this finding is cholesterolosis. Gallbladder lymphangioleiomyomatosis is a second differential consideration for this finding. There are no gallstones or gallbladder wall thickening.  Pancreas is largely obscured by gas.  There is mild atherosclerotic change in the aorta.  Study otherwise unremarkable.   Original Report Authenticated By: Arvin Collard. WOODRUFF III, M.D.      1. Lumbar radiculopathy     1:37 PM Patient seen and examined. Work-up initiated. Medications ordered.   Vital signs reviewed and are as follows: Filed Vitals:   11/02/11 1215  BP: 132/63  Pulse:  63  Temp: 97.8 F (36.6 C)  Resp: 18   3:32 PM Patient informed of x-ray results. Not much improvement after first dose pain medication. Will re-dose.   4:36 PM Patient d/w and seen by Dr. Ranae Palms. Will move to CDU on back pain protocol.   Patient informed of results, including [place] results ordered by PCP, performed this morning.  Handoff to [facility] NP who will monitor and dispo.  Plan: improve in CDU, d/c with pain medication, muscle relaxant   MDM  Lumbar radiculopathy type pain. No gross neurological deficits. X-ray shows degenerative findings. Patient is ambulatory. No warning symptoms of back pain including: loss of bowel or bladder control, night sweats, waking from sleep with back pain, unexplained fevers or weight loss, h/o cancer, IVDU, recent trauma. No concern for cauda equina, epidural abscess, or other serious cause of back pain. Conservative measures such as rest, ice/heat and pain medicine indicated with PCP follow-up if no improvement with conservative management.          Renne Crigler, [place] 11/02/11 [phone]

## 2011-11-03 MED ORDER — CYCLOBENZAPRINE HCL 10 MG PO TABS: 10.0000 mg | ORAL_TABLET | Freq: Three times a day (TID) | ORAL | Status: DC | PRN

## 2011-11-03 MED ORDER — METOCLOPRAMIDE HCL 5 MG/ML IJ SOLN
10.0000 mg | Freq: Once | INTRAMUSCULAR | Status: AC
  Administered 2011-11-03: 10 mg via INTRAVENOUS
  Filled 2011-11-03: qty 2

## 2011-11-03 MED ORDER — DIPHENHYDRAMINE HCL 50 MG/ML IJ SOLN
25.0000 mg | Freq: Once | INTRAMUSCULAR | Status: AC
  Administered 2011-11-03: 25 mg via INTRAVENOUS
  Filled 2011-11-03: qty 1

## 2011-11-03 MED ORDER — TOPIRAMATE 100 MG PO TABS
100.0000 mg | ORAL_TABLET | Freq: Two times a day (BID) | ORAL | Status: DC
  Administered 2011-11-03: 100 mg via ORAL
  Filled 2011-11-03: qty 1

## 2011-11-03 MED ORDER — DEXAMETHASONE SODIUM PHOSPHATE 10 MG/ML IJ SOLN
10.0000 mg | Freq: Once | INTRAMUSCULAR | Status: AC
  Administered 2011-11-03: 10 mg via INTRAVENOUS
  Filled 2011-11-03: qty 1

## 2011-11-03 NOTE — Telephone Encounter (Signed)
Called left message to call back 

## 2011-11-03 NOTE — Telephone Encounter (Signed)
Called the patient informed of results and referral.  [facility] and was recommended to increase her muscle relaxer (PCP would need to do) and  She stated she has duragesic 50 mg at home is ok to use?

## 2011-11-03 NOTE — ED Notes (Signed)
Pt placed on 2L [place] due to stats dropping into upper 80's with rest.

## 2011-11-03 NOTE — Progress Notes (Signed)
Utilization review completed.  

## 2011-11-03 NOTE — ED Provider Notes (Signed)
I was available for consult during the portion of the patient's CDU course during my shift.   Gerhard Munch, MD 11/03/11 0025

## 2011-11-03 NOTE — ED Notes (Signed)
Pharmacy called to check on delay in getting med

## 2011-11-03 NOTE — ED Notes (Signed)
Lights off for comfort. Pt reports she has a headache. Emesis bag given. Pt is currently sleeping. Primary RN updated.

## 2011-11-03 NOTE — ED Notes (Signed)
Pt with episode of vomiting x 2 while going to the restroom, will speak with EDP

## 2011-11-03 NOTE — Telephone Encounter (Signed)
On the record, we have her having had duragesic 25 patch prescribed nov 2012;  Ok to use one patch which would last 3 days, then stop  OK for flexeril increased - done erx

## 2011-11-03 NOTE — ED Notes (Signed)
Pt given a phone to call for her son to pick her up at discharge

## 2011-11-03 NOTE — ED Provider Notes (Signed)
Patient in CDU for back pain protocol. Imaging remarkable for degenerative changes. Patient back pain improved. Able to ambulate and tolerate PO. Patient complained of migraine like her other migraines while in the CDU. Patient given migraine cocktail and home migraine meds with improvement. Ready for discharge.   DG Lumbar Spine Complete (Final result)   Result time:11/02/11 1432    Final result by Rad Results In Interface (11/02/11 14:32:03)    Narrative:   *RADIOLOGY REPORT*  Clinical Data: Back pain.  LUMBAR SPINE - COMPLETE 4+ VIEW  Comparison: None.  Findings: Degenerative facet disease in the lower lumbar spine. Disc space narrowing at L5-S1. Normal alignment. No fracture. SI joints are symmetric and unremarkable.  IMPRESSION: Degenerative changes as above. No acute findings.   Original Report Authenticated By: Cyndie Chime, M.D.      Pixie Casino, PA-C 11/03/11 1125

## 2011-11-04 NOTE — ED Provider Notes (Signed)
Medical screening examination/treatment/procedure(s) were performed by non-physician practitioner and as supervising physician I was immediately available for consultation/collaboration.  Tobin Chad, MD 11/04/11 [phone]

## 2011-11-06 NOTE — ED Provider Notes (Signed)
Medical screening examination/treatment/procedure(s) were conducted as a shared visit with non-physician practitioner(s) and myself.  I personally evaluated the patient during the encounter   Loren Racer, MD 11/06/11 1526

## 2011-11-06 NOTE — Telephone Encounter (Signed)
Patient informed. 

## 2011-11-09 ENCOUNTER — Encounter (INDEPENDENT_AMBULATORY_CARE_PROVIDER_SITE_OTHER): Payer: Self-pay | Admitting: General Surgery

## 2011-11-09 ENCOUNTER — Other Ambulatory Visit (INDEPENDENT_AMBULATORY_CARE_PROVIDER_SITE_OTHER): Payer: Self-pay

## 2011-11-09 ENCOUNTER — Ambulatory Visit (INDEPENDENT_AMBULATORY_CARE_PROVIDER_SITE_OTHER): Payer: Medicare Other | Admitting: General Surgery

## 2011-11-09 VITALS — BP 118/70 | HR 96 | Temp 97.1°F | Resp 20 | Ht 64.5 in | Wt 212.8 lb

## 2011-11-09 DIAGNOSIS — R109 Unspecified abdominal pain: Secondary | ICD-10-CM

## 2011-11-09 NOTE — Progress Notes (Signed)
Subjective:     Patient ID: [name], female   DOB: [birth year], 61 y.o.   MRN: [id number]  HPI The patient is a 61 year old female with generalized abdominal pain including the left upper quadrant midline and a similar quadrant pain. Patient has no relation pain to food. She does state she does have some nausea vomited at times.  She does state she has some episodes of lightheadedness associated with nausea vomiting. There are no relieving factors at this time. Patient underwent ultrasound and laboratory studies which revealed questionable gallbladder wall thickening however there were some gallstones with normal LFTs and her blood work.  Review of Systems  Constitutional: Negative.   HENT: Negative.   Eyes: Negative.   Respiratory: Negative.   Cardiovascular: Negative.   Gastrointestinal: Positive for abdominal pain.  Genitourinary: Negative.   Musculoskeletal: Negative.        Objective:   Physical Exam  Constitutional: She is oriented to person, place, and time. She appears well-developed and well-nourished.  HENT:  Head: Normocephalic and atraumatic.  Eyes: Conjunctivae normal are normal. Pupils are equal, round, and reactive to light.  Neck: Normal range of motion. Neck supple.  Cardiovascular: Normal rate, regular rhythm and normal heart sounds.   Pulmonary/Chest: Effort normal and breath sounds normal.  Abdominal: Soft. Bowel sounds are normal. There is tenderness (min ttp RUQ).  Musculoskeletal: Normal range of motion.  Neurological: She is alert and oriented to person, place, and time.       Assessment:     The patient's 61 year old female with possible biliary colic.    Plan:     1. We will send the patient for HIDA scan to evaluate her biliary output. She was followup with [place] scan to discuss results of the possibility of lack of cholecystectomy. Advised patient to continue a low-fat diet this time should be her gallbladder causing her pain.

## 2011-11-21 ENCOUNTER — Ambulatory Visit (HOSPITAL_COMMUNITY)
Admission: RE | Admit: 2011-11-21 | Discharge: 2011-11-21 | Disposition: A | Discharge status: Home / Self Care | Payer: Medicare Other | Source: Ambulatory Visit | Attending: General Surgery | Admitting: General Surgery

## 2011-11-21 DIAGNOSIS — R109 Unspecified abdominal pain: Secondary | ICD-10-CM | POA: Insufficient documentation

## 2011-11-21 MED ORDER — TECHNETIUM TC 99M MEBROFENIN IV KIT
5.4000 | PACK | Freq: Once | INTRAVENOUS | Status: AC | PRN
  Administered 2011-11-21: 5 via INTRAVENOUS

## 2011-11-24 ENCOUNTER — Telehealth (INDEPENDENT_AMBULATORY_CARE_PROVIDER_SITE_OTHER): Payer: Self-pay | Admitting: General Surgery

## 2011-11-24 NOTE — Telephone Encounter (Signed)
Pt stated Dr. Derrell Lolling had instructed her to call [facility] Boss for results of the HIDA scan.  Gave her WNL, with EF low-normal results.  She will attend her follow up appt to discuss options.

## 2011-11-30 ENCOUNTER — Encounter (INDEPENDENT_AMBULATORY_CARE_PROVIDER_SITE_OTHER): Payer: Self-pay | Admitting: General Surgery

## 2011-11-30 ENCOUNTER — Ambulatory Visit (INDEPENDENT_AMBULATORY_CARE_PROVIDER_SITE_OTHER): Payer: Medicare Other | Admitting: General Surgery

## 2011-11-30 VITALS — BP 124/80 | HR 86 | Temp 98.6°F | Resp 14 | Ht 64.5 in | Wt 211.4 lb

## 2011-11-30 DIAGNOSIS — K828 Other specified diseases of gallbladder: Secondary | ICD-10-CM

## 2011-11-30 NOTE — Progress Notes (Signed)
Patient ID: [name], female   DOB: 12-Nov-1950, 61 y.o.   MRN: [id number]  Chief Complaint  Patient presents with  . Follow-up    HPI [name] is a 61 y.o. female.  The patient has undergone HIDA scan for quadrant pain epigastric pain with nausea and vomiting. I scan reveals ejection fraction of 34% with normal being 33%. Patient is having nausea vomiting bloating symptoms and pain after eating. she states she would like to at this point proceed with cholecystectomy to see if these relief of her symptoms. HPI  Past Medical History  Diagnosis Date  . Endometriosis   . Osteopenia   . Asthma   . Hypertension   . Fibromyalgia   . Thyroid disease     Hypothyroid  . Elevated cholesterol   . Migraines   . Bowel obstruction   . GERD (gastroesophageal reflux disease)   . Impaired glucose tolerance 02/25/2011    Past Surgical History  Procedure Date  . Tonsillectomy   . Pelvic laparoscopy 1995,2002    DL LSO DL RSO  . Vaginal hysterectomy 1983  . Oophorectomy     LSO-BSO  . Breast surgery     Benign breast cyst    Family History  Problem Relation Age of Onset  . Hypertension Mother   . Heart disease Mother   . Breast cancer Mother   . Cancer Mother     Bonecancer  . Hypertension Father   . Heart disease Father   . Colon cancer Sister   . Sarcoidosis Sister   . Cancer Sister     colon  . Breast cancer Maternal Grandmother   . Cancer Maternal Grandmother     breast  . Breast cancer Paternal Grandmother   . Ovarian cancer Paternal Grandmother   . Cancer Paternal Grandmother     ovarian  . Sarcoidosis Brother   . Cancer Maternal Aunt     breast  . Cancer Maternal Aunt     breast    Social History History  Substance Use Topics  . Smoking status: Never Smoker   . Smokeless tobacco: Never Used  . Alcohol Use: 1.0 oz/week    2 drink(s) per week     Comment: socially    Allergies  Allergen Reactions  . Celecoxib   . Codeine     REACTION:  Nausea  . Duloxetine     REACTION: n/v  . Erythromycin     REACTION: Itching  . Escitalopram Oxalate Nausea Only  . Ezetimibe   . Iohexol      Desc: VIOX, CELEBREX, EMYCIN   . Milnacipran     REACTION: n/v  . Pregabalin     REACTION: wt gain  . Rofecoxib   . Statins     Current Outpatient Prescriptions  Medication Sig Dispense Refill  . albuterol (PROVENTIL HFA;VENTOLIN HFA) 108 (90 BASE) MCG/ACT inhaler Inhale 2 puffs into the lungs every 6 (six) hours as needed. For wheezing      . albuterol (PROVENTIL) (2.5 MG/3ML) 0.083% nebulizer solution Take 2.5 mg by nebulization every 6 (six) hours as needed. For wheezing      . Cholecalciferol (VITAMIN D PO) Take 1,200 Units by mouth daily.       . cyclobenzaprine (FLEXERIL) 10 MG tablet Take 1 tablet (10 mg total) by mouth 3 (three) times daily as needed for muscle spasms.  90 tablet  1  . diclofenac sodium (VOLTAREN) 1 % GEL Apply 2 g topically as needed.  For pain      . dicyclomine (BENTYL) 10 MG capsule Take 1 capsule (10 mg total) by mouth 4 (four) times daily -  before meals and at bedtime. As needed  120 capsule  5  . estradiol (CLIMARA) 0.075 mg/24hr Place 1 patch (0.075 mg total) onto the skin once a week.  4 patch  6  . fentaNYL (DURAGESIC - DOSED MCG/HR) 50 MCG/HR Place 1 patch onto the skin every 3 (three) days.      . fluticasone (FLONASE) 50 MCG/ACT nasal spray Place 2 sprays into the nose daily.  16 g  2  . hydrocortisone butyrate (LUCOID) 0.1 % CREA cream Apply topically 2 (two) times daily.      . hydroquinone 4 % cream Apply 1 application topically 2 (two) times daily.      . metoprolol succinate (TOPROL-XL) 100 MG 24 hr tablet Take 100 mg by mouth daily. Take with or immediately following a meal.      . Pitavastatin Calcium (LIVALO) 4 MG TABS Take 1 tablet by mouth daily.      . polyethylene glycol powder (MIRALAX) powder Take 17 g by mouth as needed. For constipation      . thyroid (ARMOUR THYROID) 30 MG tablet Take 1  tablet (30 mg total) by mouth daily.  90 tablet  3  . thyroid (ARMOUR) 30 MG tablet Take 30 mg by mouth daily.      Marland Kitchen topiramate (TOPAMAX) 100 MG tablet Take 100 mg by mouth 2 (two) times daily.        Review of Systems Review of Systems  Constitutional: Negative.   HENT: Negative.   Eyes: Negative.   Respiratory: Negative.   Cardiovascular: Negative.   Gastrointestinal: Negative.   Neurological: Negative.     Blood pressure 124/80, pulse 86, temperature 98.6 F (37 C), temperature source Temporal, resp. rate 14, height 5' 4.5" (1.638 m), weight 211 lb 6.4 oz (95.89 kg).  Physical Exam Physical Exam  Constitutional: She is oriented to person, place, and time. She appears well-developed and well-nourished.  HENT:  Head: Normocephalic and atraumatic.  Eyes: Conjunctivae normal and EOM are normal. Pupils are equal, round, and reactive to light.  Neck: Normal range of motion. Neck supple.  Cardiovascular: Normal rate and regular rhythm.   Pulmonary/Chest: Effort normal and breath sounds normal.  Abdominal: Soft. Bowel sounds are normal. She exhibits no mass. There is tenderness (ruq). There is no rebound and no guarding.  Neurological: She is alert and oriented to person, place, and time.    Data Reviewed Ultrasound which revealed no gallstones or gallbladder wall thickening. HIDA scan revealed an ejection fraction of 34%  Assessment    61 year old female with biliary dyskinesia    Plan    1. We'll proceed to the operating room for lap cholecystectomy.  2.All risks and benefits were discussed with the patient, to generally include infection, bleeding, damage to surrounding structures, and recurrence. Alternatives were offered and described.  All questions were answered and the patient voiced understanding of the procedure and wishes to proceed at this point.        Marigene Ehlers., Veldon Wager 11/30/2011, 11:28 AM

## 2011-12-01 ENCOUNTER — Ambulatory Visit (HOSPITAL_COMMUNITY)
Admission: RE | Admit: 2011-12-01 | Discharge: 2011-12-01 | Disposition: A | Discharge status: Home / Self Care | Payer: Medicare Other | Source: Ambulatory Visit | Attending: General Surgery | Admitting: General Surgery

## 2011-12-01 ENCOUNTER — Encounter (HOSPITAL_COMMUNITY): Payer: Self-pay

## 2011-12-01 ENCOUNTER — Encounter (HOSPITAL_COMMUNITY)
Admission: RE | Admit: 2011-12-01 | Discharge: 2011-12-01 | Disposition: A | Discharge status: Home / Self Care | Payer: Medicare Other | Source: Ambulatory Visit | Attending: General Surgery | Admitting: General Surgery

## 2011-12-01 ENCOUNTER — Telehealth: Payer: Self-pay | Admitting: *Deleted

## 2011-12-01 DIAGNOSIS — Z01812 Encounter for preprocedural laboratory examination: Secondary | ICD-10-CM | POA: Insufficient documentation

## 2011-12-01 DIAGNOSIS — J4489 Other specified chronic obstructive pulmonary disease: Secondary | ICD-10-CM | POA: Insufficient documentation

## 2011-12-01 DIAGNOSIS — R059 Cough, unspecified: Secondary | ICD-10-CM | POA: Insufficient documentation

## 2011-12-01 DIAGNOSIS — R0602 Shortness of breath: Secondary | ICD-10-CM | POA: Insufficient documentation

## 2011-12-01 DIAGNOSIS — R05 Cough: Secondary | ICD-10-CM | POA: Insufficient documentation

## 2011-12-01 DIAGNOSIS — J449 Chronic obstructive pulmonary disease, unspecified: Secondary | ICD-10-CM | POA: Insufficient documentation

## 2011-12-01 DIAGNOSIS — Z01818 Encounter for other preprocedural examination: Secondary | ICD-10-CM | POA: Insufficient documentation

## 2011-12-01 HISTORY — DX: Low back pain, unspecified: M54.50

## 2011-12-01 HISTORY — DX: Nocturia: R35.1

## 2011-12-01 HISTORY — DX: Cardiac murmur, unspecified: R01.1

## 2011-12-01 HISTORY — DX: Adverse effect of unspecified anesthetic, initial encounter: T41.45XA

## 2011-12-01 HISTORY — DX: Depression, unspecified: F32.A

## 2011-12-01 HISTORY — DX: Sleep apnea, unspecified: G47.30

## 2011-12-01 HISTORY — DX: Major depressive disorder, single episode, unspecified: F32.9

## 2011-12-01 HISTORY — DX: Shortness of breath: R06.02

## 2011-12-01 HISTORY — DX: Personal history of other diseases of the digestive system: Z87.19

## 2011-12-01 HISTORY — DX: Hypothyroidism, unspecified: E03.9

## 2011-12-01 HISTORY — DX: Other specified diseases of gallbladder: K82.8

## 2011-12-01 HISTORY — DX: Low back pain: M54.5

## 2011-12-01 HISTORY — DX: Other complications of anesthesia, initial encounter: T88.59XA

## 2011-12-01 HISTORY — DX: Diverticulosis of intestine, part unspecified, without perforation or abscess without bleeding: K57.90

## 2011-12-01 HISTORY — DX: Unspecified osteoarthritis, unspecified site: M19.90

## 2011-12-01 LAB — COMPREHENSIVE METABOLIC PANEL
ALT: 12 U/L (ref 0–35)
CO2: 27 mEq/L (ref 19–32)
Calcium: 9.6 mg/dL (ref 8.4–10.5)
GFR calc Af Amer: 86 mL/min — ABNORMAL LOW (ref >90)
GFR calc non Af Amer: 75 mL/min — ABNORMAL LOW (ref >90)
Glucose, Bld: 82 mg/dL (ref 70–99)
Sodium: 138 mEq/L (ref 135–145)

## 2011-12-01 LAB — SURGICAL PCR SCREEN: MRSA, PCR: NEGATIVE

## 2011-12-01 LAB — CBC
HCT: 45.7 % (ref 36.0–46.0)
Hemoglobin: 15.6 g/dL — ABNORMAL HIGH (ref 12.0–15.0)
MCH: 29.2 pg (ref 26.0–34.0)
MCV: 85.6 fL (ref 78.0–100.0)
RBC: 5.34 MIL/uL — ABNORMAL HIGH (ref 3.87–5.11)

## 2011-12-01 NOTE — Patient Instructions (Addendum)
[name]  12/01/2011                           YOUR PROCEDURE IS SCHEDULED ON: 12/05/11 AT 9:30 AM               PLEASE REPORT TO SHORT STAY CENTER AT :  7:00 AM               CALL THIS NUMBER IF ANY PROBLEMS THE DAY OF SURGERY :               [phone]                      REMEMBER:   Do not eat food or drink liquids AFTER MIDNIGHT   Take these medicines the morning of surgery with A SIP OF WATER:  THYROID MEDICATION / USE INHALERS IF NEEDED   Do not wear jewelry, make-up   Do not wear lotions, powders, or perfumes.   Do not shave legs or underarms 12 hrs. before surgery (men may shave face)  Do not bring valuables to the hospital.  Contacts, dentures or bridgework may not be worn into surgery.  Leave suitcase in the car. After surgery it may be brought to your room.  For patients admitted to the hospital more than one night, checkout time is 11:00                          The day of discharge.   Patients discharged the day of surgery will not be allowed to drive home                             If going home same day of surgery, must have someone stay with you first                           24 hrs at home and arrange for some one to drive you home from hospital.    Special Instructions:   Please read over the following fact sheets that you were given:               1. MRSA  INFORMATION                      2. [facility] PREPARING FOR SURGERY SHEET                                                X_____________________________________________________________________

## 2011-12-01 NOTE — Telephone Encounter (Signed)
Pt reschedule her annual for Dec. 17. Pt asked if she should have bone density done this year? If yes pt asked if she could have it done here? [facility]xa done [facility].  Please advise

## 2011-12-02 NOTE — Telephone Encounter (Signed)
Usually we only do bone densities every 2 years. I may need her paper chart to see why we were going to do it earlier. I cannot find a note relevant to the one-year followup bone density in the epic record. Ask  patient if we have discussed one-year followup bone density.

## 2011-12-04 NOTE — Telephone Encounter (Signed)
Left message for pt to call.

## 2011-12-05 ENCOUNTER — Ambulatory Visit (HOSPITAL_COMMUNITY): Payer: Medicare Other | Admitting: Certified Registered Nurse Anesthetist

## 2011-12-05 ENCOUNTER — Encounter (HOSPITAL_COMMUNITY)
Admission: RE | Disposition: A | Discharge status: Home / Self Care | Payer: Self-pay | Source: Ambulatory Visit | Attending: General Surgery

## 2011-12-05 ENCOUNTER — Encounter (HOSPITAL_COMMUNITY): Payer: Self-pay | Admitting: Certified Registered Nurse Anesthetist

## 2011-12-05 ENCOUNTER — Encounter (HOSPITAL_COMMUNITY): Payer: Self-pay | Admitting: *Deleted

## 2011-12-05 ENCOUNTER — Encounter: Payer: Medicare Other | Admitting: Obstetrics and Gynecology

## 2011-12-05 ENCOUNTER — Ambulatory Visit (HOSPITAL_COMMUNITY)
Admission: RE | Admit: 2011-12-05 | Discharge: 2011-12-05 | Disposition: A | Discharge status: Home / Self Care | Payer: Medicare Other | Source: Ambulatory Visit | Attending: General Surgery | Admitting: General Surgery

## 2011-12-05 DIAGNOSIS — K219 Gastro-esophageal reflux disease without esophagitis: Secondary | ICD-10-CM | POA: Insufficient documentation

## 2011-12-05 DIAGNOSIS — IMO0001 Reserved for inherently not codable concepts without codable children: Secondary | ICD-10-CM | POA: Insufficient documentation

## 2011-12-05 DIAGNOSIS — Z79899 Other long term (current) drug therapy: Secondary | ICD-10-CM | POA: Insufficient documentation

## 2011-12-05 DIAGNOSIS — K811 Chronic cholecystitis: Secondary | ICD-10-CM | POA: Insufficient documentation

## 2011-12-05 DIAGNOSIS — M899 Disorder of bone, unspecified: Secondary | ICD-10-CM | POA: Insufficient documentation

## 2011-12-05 DIAGNOSIS — I10 Essential (primary) hypertension: Secondary | ICD-10-CM | POA: Insufficient documentation

## 2011-12-05 HISTORY — PX: CHOLECYSTECTOMY: SHX55

## 2011-12-05 SURGERY — LAPAROSCOPIC CHOLECYSTECTOMY
Anesthesia: General | Site: Abdomen | Wound class: Clean

## 2011-12-05 MED ORDER — FENTANYL CITRATE 0.05 MG/ML IJ SOLN
INTRAMUSCULAR | Status: DC | PRN
  Administered 2011-12-05 (×3): 50 ug via INTRAVENOUS

## 2011-12-05 MED ORDER — MIDAZOLAM HCL 5 MG/5ML IJ SOLN
INTRAMUSCULAR | Status: DC | PRN
  Administered 2011-12-05 (×2): 1 mg via INTRAVENOUS

## 2011-12-05 MED ORDER — DEXAMETHASONE SODIUM PHOSPHATE 10 MG/ML IJ SOLN
INTRAMUSCULAR | Status: DC | PRN
  Administered 2011-12-05: 10 mg via INTRAVENOUS

## 2011-12-05 MED ORDER — NEOSTIGMINE METHYLSULFATE 1 MG/ML IJ SOLN
INTRAMUSCULAR | Status: DC | PRN
  Administered 2011-12-05: 4 mg via INTRAVENOUS

## 2011-12-05 MED ORDER — PROPOFOL 10 MG/ML IV BOLUS
INTRAVENOUS | Status: DC | PRN
  Administered 2011-12-05: 150 mg via INTRAVENOUS

## 2011-12-05 MED ORDER — HYDROMORPHONE HCL PF 1 MG/ML IJ SOLN: 1.0000 mg | INTRAMUSCULAR | Status: DC | PRN

## 2011-12-05 MED ORDER — LACTATED RINGERS IV SOLN
INTRAVENOUS | Status: DC | PRN
  Administered 2011-12-05: 1000 mL via INTRAVENOUS

## 2011-12-05 MED ORDER — HYDROMORPHONE HCL PF 1 MG/ML IJ SOLN
INTRAMUSCULAR | Status: AC
  Filled 2011-12-05: qty 1

## 2011-12-05 MED ORDER — CEFAZOLIN SODIUM-DEXTROSE 2-3 GM-% IV SOLR
INTRAVENOUS | Status: AC
  Filled 2011-12-05: qty 50

## 2011-12-05 MED ORDER — BUPIVACAINE-EPINEPHRINE PF 0.5-1:200000 % IJ SOLN
INTRAMUSCULAR | Status: DC | PRN
  Administered 2011-12-05: 15 mL

## 2011-12-05 MED ORDER — GLYCOPYRROLATE 0.2 MG/ML IJ SOLN
INTRAMUSCULAR | Status: DC | PRN
  Administered 2011-12-05: 0.6 mg via INTRAVENOUS

## 2011-12-05 MED ORDER — BUPIVACAINE-EPINEPHRINE PF 0.25-1:200000 % IJ SOLN
INTRAMUSCULAR | Status: AC
  Filled 2011-12-05: qty 30

## 2011-12-05 MED ORDER — LACTATED RINGERS IV SOLN
INTRAVENOUS | Status: DC
  Administered 2011-12-05: 11:00:00 via INTRAVENOUS

## 2011-12-05 MED ORDER — KETOROLAC TROMETHAMINE 30 MG/ML IJ SOLN
INTRAMUSCULAR | Status: AC
  Filled 2011-12-05: qty 1

## 2011-12-05 MED ORDER — ACETAMINOPHEN 10 MG/ML IV SOLN
INTRAVENOUS | Status: AC
  Filled 2011-12-05: qty 100

## 2011-12-05 MED ORDER — HYDROMORPHONE HCL PF 1 MG/ML IJ SOLN
0.2500 mg | INTRAMUSCULAR | Status: DC | PRN
  Administered 2011-12-05 (×4): 0.5 mg via INTRAVENOUS

## 2011-12-05 MED ORDER — ACETAMINOPHEN 10 MG/ML IV SOLN
INTRAVENOUS | Status: DC | PRN
  Administered 2011-12-05: 1000 mg via INTRAVENOUS

## 2011-12-05 MED ORDER — LACTATED RINGERS IV SOLN
INTRAVENOUS | Status: DC | PRN
  Administered 2011-12-05: 09:00:00 via INTRAVENOUS

## 2011-12-05 MED ORDER — ONDANSETRON HCL 4 MG/2ML IJ SOLN: 4.0000 mg | Freq: Four times a day (QID) | INTRAMUSCULAR | Status: DC | PRN

## 2011-12-05 MED ORDER — ROCURONIUM BROMIDE 100 MG/10ML IV SOLN
INTRAVENOUS | Status: DC | PRN
  Administered 2011-12-05: 20 mg via INTRAVENOUS

## 2011-12-05 MED ORDER — CEFAZOLIN SODIUM-DEXTROSE 2-3 GM-% IV SOLR
2.0000 g | INTRAVENOUS | Status: AC
  Administered 2011-12-05: 2 g via INTRAVENOUS

## 2011-12-05 MED ORDER — ACETAMINOPHEN 325 MG PO TABS: 650.0000 mg | ORAL_TABLET | ORAL | Status: DC | PRN

## 2011-12-05 MED ORDER — CHLORHEXIDINE GLUCONATE 4 % EX LIQD: 1.0000 "application " | Freq: Once | CUTANEOUS | Status: DC

## 2011-12-05 MED ORDER — KETOROLAC TROMETHAMINE 30 MG/ML IJ SOLN
30.0000 mg | Freq: Once | INTRAMUSCULAR | Status: AC
  Administered 2011-12-05: 30 mg via INTRAVENOUS

## 2011-12-05 MED ORDER — ONDANSETRON HCL 4 MG PO TABS
4.0000 mg | ORAL_TABLET | Freq: Four times a day (QID) | ORAL | Status: DC | PRN
  Filled 2011-12-05: qty 1

## 2011-12-05 MED ORDER — SUCCINYLCHOLINE CHLORIDE 20 MG/ML IJ SOLN
INTRAMUSCULAR | Status: DC | PRN
  Administered 2011-12-05: 100 mg via INTRAVENOUS

## 2011-12-05 MED ORDER — ONDANSETRON HCL 4 MG/2ML IJ SOLN
INTRAMUSCULAR | Status: DC | PRN
  Administered 2011-12-05: 4 mg via INTRAVENOUS

## 2011-12-05 SURGICAL SUPPLY — 40 items
APL SKNCLS STERI-STRIP NONHPOA (GAUZE/BANDAGES/DRESSINGS) ×1
APPLIER CLIP 5 13 M/L LIGAMAX5 (MISCELLANEOUS)
APR CLP MED LRG 5 ANG JAW (MISCELLANEOUS)
BAG SPEC RTRVL LRG 6X4 10 (ENDOMECHANICALS)
BENZOIN TINCTURE PRP APPL 2/3 (GAUZE/BANDAGES/DRESSINGS) ×2 IMPLANT
CABLE HIGH FREQUENCY MONO STRZ (ELECTRODE) ×2 IMPLANT
CANISTER SUCTION 2500CC (MISCELLANEOUS) ×2 IMPLANT
CHLORAPREP W/TINT 26ML (MISCELLANEOUS) ×2 IMPLANT
CLIP APPLIE 5 13 M/L LIGAMAX5 (MISCELLANEOUS) IMPLANT
CLOTH BEACON ORANGE TIMEOUT ST (SAFETY) ×2 IMPLANT
COVER MAYO STAND STRL (DRAPES) IMPLANT
DECANTER SPIKE VIAL GLASS SM (MISCELLANEOUS) ×2 IMPLANT
DEVICE TROCAR PUNCTURE CLOSURE (ENDOMECHANICALS) ×2 IMPLANT
DRAPE C-ARM 42X72 X-RAY (DRAPES) IMPLANT
DRAPE LAPAROSCOPIC ABDOMINAL (DRAPES) ×2 IMPLANT
ELECT REM PT RETURN 9FT ADLT (ELECTROSURGICAL) ×2
ELECTRODE REM PT RTRN 9FT ADLT (ELECTROSURGICAL) ×1 IMPLANT
GAUZE SPONGE 2X2 8PLY STRL LF (GAUZE/BANDAGES/DRESSINGS) ×1 IMPLANT
GLOVE BIO SURGEON STRL SZ7.5 (GLOVE) ×2 IMPLANT
GOWN STRL NON-REIN LRG LVL3 (GOWN DISPOSABLE) ×2 IMPLANT
GOWN STRL REIN XL XLG (GOWN DISPOSABLE) ×4 IMPLANT
HEMOSTAT SURGICEL 4X8 (HEMOSTASIS) IMPLANT
KIT BASIN OR (CUSTOM PROCEDURE TRAY) ×2 IMPLANT
NDL INSUFFLATION 14GA 120MM (NEEDLE) ×1 IMPLANT
NEEDLE INSUFFLATION 14GA 120MM (NEEDLE) ×2 IMPLANT
NS IRRIG 1000ML POUR BTL (IV SOLUTION) IMPLANT
POUCH SPECIMEN RETRIEVAL 10MM (ENDOMECHANICALS) IMPLANT
SCISSORS LAP 5X35 DISP (ENDOMECHANICALS) ×2 IMPLANT
SET CHOLANGIOGRAPH MIX (MISCELLANEOUS) IMPLANT
SET IRRIG TUBING LAPAROSCOPIC (IRRIGATION / IRRIGATOR) ×2 IMPLANT
SOLUTION ANTI FOG 6CC (MISCELLANEOUS) ×2 IMPLANT
SPONGE GAUZE 2X2 STER 10/PKG (GAUZE/BANDAGES/DRESSINGS) ×1
SPONGE GAUZE 4X4 12PLY (GAUZE/BANDAGES/DRESSINGS) ×2 IMPLANT
STRIP CLOSURE SKIN 1/2X4 (GAUZE/BANDAGES/DRESSINGS) ×2 IMPLANT
SUT MNCRL AB 4-0 PS2 18 (SUTURE) ×2 IMPLANT
TOWEL OR 17X26 10 PK STRL BLUE (TOWEL DISPOSABLE) ×2 IMPLANT
TRAY LAP CHOLE (CUSTOM PROCEDURE TRAY) ×2 IMPLANT
TROCAR BLADELESS OPT 5 75 (ENDOMECHANICALS) ×7 IMPLANT
TROCAR XCEL NON-BLD 11X100MML (ENDOMECHANICALS) ×2 IMPLANT
TUBING INSUFFLATION 10FT LAP (TUBING) ×2 IMPLANT

## 2011-12-05 NOTE — Preoperative (Signed)
Beta Blockers   Reason not to administer Beta Blockers:Not Applicable due this pm

## 2011-12-05 NOTE — Op Note (Addendum)
Pre Operative Diagnosis: biliary dyskinesia  Post Operative Diagnosis: same  Surgeon: Dr. Axel Filler   Procedure:laparoscopic cholecystectomy  Assistant: none  Anesthesia: Gen. Endotracheal anesthesia   EBL: 5 cc  Complications:  Counts: reported as correct x 2   Findings: The patient had contracted gallbladder  Indications for procedure: the patient is a 61 year old female with history of nausea vomiting epigastric pain. Patient had high scan was equivocal results. This patient said have this electively removed.  Details of the procedure:  The patient was taken to the operating and placed in the supine position with bilateral SCDs in place. A time out was called and all facts were verified. A pneumoperitoneum was obtained via A Veress needle technique to a pressure of 14mm of mercury. A 5mm trochar was then placed in the right upper quadrant under visualization, and there were no injuries to any abdominal organs. A 11 mm port was then placed in the umbilical region after infiltrating with local anesthesia under direct visualization. A second and third epigastric port and right lower quadrant port placement under direct visualization, respectively. The gallbladder was identified and retracted, the peritoneum was then sharply dissected from the gallbladder and this dissection was carried down to Calot's triangle. The gallbladder was identified and stripped away circumferentially and seen going into the gallbladder 360.  2 clips were placed proximally one distally and the cystic duct transected. The cystic artery was identified and 2 clips placed proximally and one distally and transected.  We then proceeded to remove the gallbladder off the hepatic fossa with Bovie cautery. An Endo Catch bag was then placed in the abdomen and gallbladder placed in the bag. The hepatic fossa was then reexamined and hemostasis was achieved with Bovie cautery and was excellent at the end of the case. The  subhepatic fossa and perihepatic fossa was then irrigated until the effluent was clear. The 11 mm trocar fascia was reapproximated with the Endo Close #1 Vicryl.  The pneumoperitoneum was evacuated and all trochars removed under direct visulalization.  The skin was then closed with 4-0 Monocryl and the skin dressed with Steri-Strips, gauze, and tape.  The patient was awaken from general anesthesia and taken to the recovery room in stable condition.

## 2011-12-05 NOTE — Interval H&P Note (Signed)
History and Physical Interval Note:  12/05/2011 9:52 AM  [name]  has presented today for surgery, with the diagnosis of biliary dyskinesia  The various methods of treatment have been discussed with the patient and family. After consideration of risks, benefits and other options for treatment, the patient has consented to  Procedure(s) (LRB) with comments: LAPAROSCOPIC CHOLECYSTECTOMY (N/A) as a surgical intervention .  The patient's history has been reviewed, patient examined, no change in status, stable for surgery.  I have reviewed the patient's chart and labs.  Questions were answered to the patient's satisfaction.     Marigene Ehlers., Jed Limerick

## 2011-12-05 NOTE — H&P (View-Only) (Signed)
Patient ID: [name], female   DOB: 07/25/1950, 61 y.o.   MRN: [id number]  Chief Complaint  Patient presents with  . Follow-up    HPI [name] is a 61 y.o. female.  The patient has undergone HIDA scan for quadrant pain epigastric pain with nausea and vomiting. I scan reveals ejection fraction of 34% with normal being 33%. Patient is having nausea vomiting bloating symptoms and pain after eating. she states she would like to at this point proceed with cholecystectomy to see if these relief of her symptoms. HPI  Past Medical History  Diagnosis Date  . Endometriosis   . Osteopenia   . Asthma   . Hypertension   . Fibromyalgia   . Thyroid disease     Hypothyroid  . Elevated cholesterol   . Migraines   . Bowel obstruction   . GERD (gastroesophageal reflux disease)   . Impaired glucose tolerance 02/25/2011    Past Surgical History  Procedure Date  . Tonsillectomy   . Pelvic laparoscopy 1995,2002    DL LSO DL RSO  . Vaginal hysterectomy 1983  . Oophorectomy     LSO-BSO  . Breast surgery     Benign breast cyst    Family History  Problem Relation Age of Onset  . Hypertension Mother   . Heart disease Mother   . Breast cancer Mother   . Cancer Mother     Bonecancer  . Hypertension Father   . Heart disease Father   . Colon cancer Sister   . Sarcoidosis Sister   . Cancer Sister     colon  . Breast cancer Maternal Grandmother   . Cancer Maternal Grandmother     breast  . Breast cancer Paternal Grandmother   . Ovarian cancer Paternal Grandmother   . Cancer Paternal Grandmother     ovarian  . Sarcoidosis Brother   . Cancer Maternal Aunt     breast  . Cancer Maternal Aunt     breast    Social History History  Substance Use Topics  . Smoking status: Never Smoker   . Smokeless tobacco: Never Used  . Alcohol Use: 1.0 oz/week    2 drink(s) per week     Comment: socially    Allergies  Allergen Reactions  . Celecoxib   . Codeine     REACTION:  Nausea  . Duloxetine     REACTION: n/v  . Erythromycin     REACTION: Itching  . Escitalopram Oxalate Nausea Only  . Ezetimibe   . Iohexol      Desc: VIOX, CELEBREX, EMYCIN   . Milnacipran     REACTION: n/v  . Pregabalin     REACTION: wt gain  . Rofecoxib   . Statins     Current Outpatient Prescriptions  Medication Sig Dispense Refill  . albuterol (PROVENTIL HFA;VENTOLIN HFA) 108 (90 BASE) MCG/ACT inhaler Inhale 2 puffs into the lungs every 6 (six) hours as needed. For wheezing      . albuterol (PROVENTIL) (2.5 MG/3ML) 0.083% nebulizer solution Take 2.5 mg by nebulization every 6 (six) hours as needed. For wheezing      . Cholecalciferol (VITAMIN D PO) Take 1,200 Units by mouth daily.       . cyclobenzaprine (FLEXERIL) 10 MG tablet Take 1 tablet (10 mg total) by mouth 3 (three) times daily as needed for muscle spasms.  90 tablet  1  . diclofenac sodium (VOLTAREN) 1 % GEL Apply 2 g topically as needed.   For pain      . dicyclomine (BENTYL) 10 MG capsule Take 1 capsule (10 mg total) by mouth 4 (four) times daily -  before meals and at bedtime. As needed  120 capsule  5  . estradiol (CLIMARA) 0.075 mg/24hr Place 1 patch (0.075 mg total) onto the skin once a week.  4 patch  6  . fentaNYL (DURAGESIC - DOSED MCG/HR) 50 MCG/HR Place 1 patch onto the skin every 3 (three) days.      . fluticasone (FLONASE) 50 MCG/ACT nasal spray Place 2 sprays into the nose daily.  16 g  2  . hydrocortisone butyrate (LUCOID) 0.1 % CREA cream Apply topically 2 (two) times daily.      . hydroquinone 4 % cream Apply 1 application topically 2 (two) times daily.      . metoprolol succinate (TOPROL-XL) 100 MG 24 hr tablet Take 100 mg by mouth daily. Take with or immediately following a meal.      . Pitavastatin Calcium (LIVALO) 4 MG TABS Take 1 tablet by mouth daily.      . polyethylene glycol powder (MIRALAX) powder Take 17 g by mouth as needed. For constipation      . thyroid (ARMOUR THYROID) 30 MG tablet Take 1  tablet (30 mg total) by mouth daily.  90 tablet  3  . thyroid (ARMOUR) 30 MG tablet Take 30 mg by mouth daily.      . topiramate (TOPAMAX) 100 MG tablet Take 100 mg by mouth 2 (two) times daily.        Review of Systems Review of Systems  Constitutional: Negative.   HENT: Negative.   Eyes: Negative.   Respiratory: Negative.   Cardiovascular: Negative.   Gastrointestinal: Negative.   Neurological: Negative.     Blood pressure 124/80, pulse 86, temperature 98.6 F (37 C), temperature source Temporal, resp. rate 14, height 5' 4.5" (1.638 m), weight 211 lb 6.4 oz (95.89 kg).  Physical Exam Physical Exam  Constitutional: She is oriented to person, place, and time. She appears well-developed and well-nourished.  HENT:  Head: Normocephalic and atraumatic.  Eyes: Conjunctivae normal and EOM are normal. Pupils are equal, round, and reactive to light.  Neck: Normal range of motion. Neck supple.  Cardiovascular: Normal rate and regular rhythm.   Pulmonary/Chest: Effort normal and breath sounds normal.  Abdominal: Soft. Bowel sounds are normal. She exhibits no mass. There is tenderness (ruq). There is no rebound and no guarding.  Neurological: She is alert and oriented to person, place, and time.    Data Reviewed Ultrasound which revealed no gallstones or gallbladder wall thickening. HIDA scan revealed an ejection fraction of 34%  Assessment    61-year-old female with biliary dyskinesia    Plan    1. We'll proceed to the operating room for lap cholecystectomy.  2.All risks and benefits were discussed with the patient, to generally include infection, bleeding, damage to surrounding structures, and recurrence. Alternatives were offered and described.  All questions were answered and the patient voiced understanding of the procedure and wishes to proceed at this point.        Naji Mehringer Jr., Aundrey Elahi 11/30/2011, 11:28 AM    

## 2011-12-05 NOTE — Anesthesia Postprocedure Evaluation (Signed)
  Anesthesia Post-op Note  Patient: [name]  Procedure(s) Performed: Procedure(s) (LRB): LAPAROSCOPIC CHOLECYSTECTOMY (N/A)  Patient Location: PACU  Anesthesia Type: General  Level of Consciousness: awake and alert   Airway and Oxygen Therapy: Patient Spontanous Breathing  Post-op Pain: mild  Post-op Assessment: Post-op Vital signs reviewed, Patient's Cardiovascular Status Stable, Respiratory Function Stable, Patent Airway and No signs of Nausea or vomiting  Post-op Vital Signs: stable  Complications: No apparent anesthesia complications

## 2011-12-05 NOTE — Transfer of Care (Signed)
Immediate Anesthesia Transfer of Care Note  Patient: [name]  Procedure(s) Performed: Procedure(s) (LRB) with comments: LAPAROSCOPIC CHOLECYSTECTOMY (N/A)  Patient Location: PACU  Anesthesia Type:General  Level of Consciousness: awake, alert , oriented and patient cooperative  Airway & Oxygen Therapy: Patient Spontanous Breathing and Patient connected to face mask oxygen  Post-op Assessment: Report given to PACU RN and Post -op Vital signs reviewed and stable  Post vital signs: Reviewed and stable  Complications: No apparent anesthesia complications

## 2011-12-05 NOTE — Anesthesia Preprocedure Evaluation (Addendum)
Anesthesia Evaluation  Patient identified by MRN, date of birth, ID band Patient awake    Reviewed: Allergy & Precautions, H&P , NPO status , Patient's Chart, lab work & pertinent test results  Airway Mallampati: II TM Distance: >3 FB Neck ROM: full    Dental No notable dental hx. (+) Teeth Intact and Dental Advisory Given   Pulmonary shortness of breath and with exertion, asthma , sleep apnea , COPD COPD inhaler,  Mild COPD but moderate asthma ( mostly coughing ) breath sounds clear to auscultation  Pulmonary exam normal       Cardiovascular hypertension, Pt. on medications Rhythm:regular Rate:Normal     Neuro/Psych  Headaches, negative neurological ROS  negative psych ROS   GI/Hepatic negative GI ROS, Neg liver ROS, PUD, GERD-  ,  Endo/Other  Hypothyroidism Impaired glucose tolerance.  Goiter.  Renal/GU negative Renal ROS  negative genitourinary   Musculoskeletal  (+) Fibromyalgia -  Abdominal (+) + obese,   Peds  Hematology negative hematology ROS (+)   Anesthesia Other Findings   Reproductive/Obstetrics negative OB ROS                         Anesthesia Physical Anesthesia Plan  ASA: III  Anesthesia Plan: General   Post-op Pain Management:    Induction: Intravenous  Airway Management Planned: Oral ETT  Additional Equipment:   Intra-op Plan:   Post-operative Plan: Extubation in OR  Informed Consent: I have reviewed the patients History and Physical, chart, labs and discussed the procedure including the risks, benefits and alternatives for the proposed anesthesia with the patient or authorized representative who has indicated his/her understanding and acceptance.   Dental Advisory Given  Plan Discussed with: CRNA and Surgeon  Anesthesia Plan Comments:         Anesthesia Quick Evaluation

## 2011-12-05 NOTE — Progress Notes (Signed)
Asst up to BR . PAS removed. Moved slow. Voided well.

## 2011-12-06 ENCOUNTER — Encounter (HOSPITAL_COMMUNITY): Payer: Self-pay | Admitting: General Surgery

## 2011-12-07 NOTE — Telephone Encounter (Signed)
Pt couldn't remember last dexa. Pt informed with last dexa date.

## 2011-12-11 ENCOUNTER — Telehealth (INDEPENDENT_AMBULATORY_CARE_PROVIDER_SITE_OTHER): Payer: Self-pay

## 2011-12-11 NOTE — Telephone Encounter (Signed)
The patient had her gallbladder surgery last week.  She has been nauseated since yesterday.  She has not vomited.  She has no fever.  She would like something for nausea called in.  I paged Dr Derrell Lolling.  He ordered Zofran 4mg  po q 4 hrs prn nausea #20.   I called this to [facility] on [address] and let the pt know.  She now complains of her lower incision being red.  There is no drainage.  She did say she got in the bathtub yesterday before reading her instructions not to.  I advised her to shower only and gently clean the area and pat it dry.  I asked her to watch for signs of pus and infection and call us if it gets worse.

## 2011-12-14 ENCOUNTER — Telehealth (INDEPENDENT_AMBULATORY_CARE_PROVIDER_SITE_OTHER): Payer: Self-pay | Admitting: General Surgery

## 2011-12-14 NOTE — Telephone Encounter (Signed)
Pt called with general questions about wound care and pain control.  Her steri-strips are still in place, but very curly now.  Explained it is OK to remove them now or to simply trim them.  Also asking about a burning sensation at the RUQ.  Suggested using the ice pack for immediate pain control and NSAIDS otherwise.  She understands all and will try.

## 2011-12-26 ENCOUNTER — Ambulatory Visit (INDEPENDENT_AMBULATORY_CARE_PROVIDER_SITE_OTHER): Payer: Medicare Other | Admitting: General Surgery

## 2011-12-26 ENCOUNTER — Encounter (INDEPENDENT_AMBULATORY_CARE_PROVIDER_SITE_OTHER): Payer: Self-pay | Admitting: General Surgery

## 2011-12-26 VITALS — BP 120/78 | HR 72 | Temp 97.6°F | Resp 18 | Ht 64.5 in | Wt 207.5 lb

## 2011-12-26 DIAGNOSIS — Z9089 Acquired absence of other organs: Secondary | ICD-10-CM

## 2011-12-26 DIAGNOSIS — Z9049 Acquired absence of other specified parts of digestive tract: Secondary | ICD-10-CM

## 2011-12-26 NOTE — Progress Notes (Signed)
Patient ID: [name], female   DOB: 12/04/1950, 61 y.o.   MRN: [id number] The patient is a 61 year old female status postcholecystectomy. Patient has been doing well postoperatively and has less right upper quadrant pain after meals. The center was therefore your prior to cholecystectomy. Patient does state she has some sharp burning sensation in the right lower quadrant incision site. She states it's more noticeable while she's moving around. She states it's better while she is lying still. Patient complains of no vomiting at this time and no drainage from her wound.  On exam: Wounds are clean dry and intact abdomen is soft nontender nondistended with active bowel sounds.  Assessment and plan: 1.Patient's followup in 6 weeks. 2. Patient can resume probiotics at this time. 3. Patient returned to work and she sees fit at this time.

## 2012-01-05 ENCOUNTER — Other Ambulatory Visit: Payer: Self-pay | Admitting: Obstetrics and Gynecology

## 2012-01-05 DIAGNOSIS — Z1231 Encounter for screening mammogram for malignant neoplasm of breast: Secondary | ICD-10-CM

## 2012-01-09 ENCOUNTER — Other Ambulatory Visit (HOSPITAL_COMMUNITY)
Admission: RE | Admit: 2012-01-09 | Discharge: 2012-01-09 | Disposition: A | Discharge status: Home / Self Care | Payer: Medicare Other | Source: Ambulatory Visit | Attending: Obstetrics and Gynecology | Admitting: Obstetrics and Gynecology

## 2012-01-09 ENCOUNTER — Ambulatory Visit (INDEPENDENT_AMBULATORY_CARE_PROVIDER_SITE_OTHER): Payer: Medicare Other | Admitting: Obstetrics and Gynecology

## 2012-01-09 ENCOUNTER — Encounter: Payer: Self-pay | Admitting: Obstetrics and Gynecology

## 2012-01-09 ENCOUNTER — Ambulatory Visit: Payer: Medicare Other

## 2012-01-09 VITALS — BP 130/78 | Ht 64.5 in | Wt 214.0 lb

## 2012-01-09 DIAGNOSIS — R232 Flushing: Secondary | ICD-10-CM

## 2012-01-09 DIAGNOSIS — N6019 Diffuse cystic mastopathy of unspecified breast: Secondary | ICD-10-CM

## 2012-01-09 DIAGNOSIS — M8580 Other specified disorders of bone density and structure, unspecified site: Secondary | ICD-10-CM

## 2012-01-09 DIAGNOSIS — N879 Dysplasia of cervix uteri, unspecified: Secondary | ICD-10-CM | POA: Insufficient documentation

## 2012-01-09 DIAGNOSIS — N951 Menopausal and female climacteric states: Secondary | ICD-10-CM

## 2012-01-09 DIAGNOSIS — M899 Disorder of bone, unspecified: Secondary | ICD-10-CM

## 2012-01-09 DIAGNOSIS — Z124 Encounter for screening for malignant neoplasm of cervix: Secondary | ICD-10-CM | POA: Insufficient documentation

## 2012-01-09 DIAGNOSIS — N871 Moderate cervical dysplasia: Secondary | ICD-10-CM

## 2012-01-09 DIAGNOSIS — Z1272 Encounter for screening for malignant neoplasm of vagina: Secondary | ICD-10-CM

## 2012-01-09 MED ORDER — ESTRADIOL ACETATE 0.1 MG/24HR VA RING: 0.1000 | VAGINAL_RING | VAGINAL | Status: DC

## 2012-01-09 NOTE — Addendum Note (Signed)
Addended by: Dayna Barker on: 01/09/2012 03:23 PM   Modules accepted: Orders

## 2012-01-09 NOTE — Patient Instructions (Signed)
Go to [facility] for genetic testing for BRCA-1 and 2.

## 2012-01-09 NOTE — Progress Notes (Addendum)
Patient came to see me today for further follow up. She has a very strong family history of breast cancer. Her mother had breast cancer at age 59 and is deceased. She has 2 maternal aunts and 2 grandparents who had breast cancer later in life. She has no family history of ovarian cancer. The patient had a vaginal hysterectomy in 1983 for dysfunctional uterine bleeding. She had a left salpingo-oophorectomy at the time of laparoscopy in 1995. In 2002 she had a right salpingo-oophorectomy at the time of laparoscopy. Findings were endometriosis and pelvic adhesive disease. She had cervical dysplasia with cryosurgery prior to hysterectomy. She thought she had vaginal dysplasia with cryosurgery after hysterectomy but I cannot find in her records. Her records before  2001 are off-site and we will get them. She recently had a cholecystectomy. She received one year of IV Reclast at the request of [facility] for a modest osteopenia. Her last bone density in early 2013 was better and they did not recommend a second injection. She recently had a cholecystectomy. She is on a .075 milligram patch of Climara. She has to take the brand because the generic does not stick. She also is having vasomotor symptoms and some diminished libido. She previously took oral testosterone with help with her libido. She is having no vaginal bleeding. She is having no pelvic pain. She is due for her mammogram. She had a normal MRI of her breasts  this year. In reviewing her records there is no record of treatment for vaginal dysplasia after hysterectomy.  ROS: 12 system review done. Pertinent positives above. Other positives include hypothyroidism, hyperlipidemia, Sleep apnea. Hypertension and venous insufficiency.She also has a history of peptic ulcer, GERD, And back pain.  HEENT: Within normal limits.Kennon Portela present. Neck: No masses. Supraclavicular lymph nodes: Not enlarged. Breasts: Examined in both sitting and lying  position. Symmetrical without skin changes or masses. Abdomen: Soft no masses guarding or rebound. No hernias. Pelvic: External within normal limits. BUS within normal limits. Vaginal examination shows good estrogen effect, no cystocele enterocele or rectocele. Cervix and uterus absent. Adnexa within normal limits. Rectovaginal confirmatory. Extremities within normal limits.  Assessment: #1. Menopausal symptoms #2. Osteopenia #3. Diminished libido. #4Barrington Ellison family history of breast cancer.#5. CIN-?VAIN  Plan: We switched her to a Femring 0.1 mg. Since she has to use brand Climara this should not be more expensive. She'll start testosterone cream 2%. She will get her yearly mammogram. She will continue periodic bone densities. We have previously discussed with her BRCA1 and BRCA2 testing. She has not followed through but said she would  Now. Pap done. We will get old records

## 2012-01-11 ENCOUNTER — Ambulatory Visit (INDEPENDENT_AMBULATORY_CARE_PROVIDER_SITE_OTHER): Payer: Medicare Other

## 2012-01-11 DIAGNOSIS — Z1231 Encounter for screening mammogram for malignant neoplasm of breast: Secondary | ICD-10-CM

## 2012-01-15 ENCOUNTER — Encounter: Payer: Self-pay | Admitting: *Deleted

## 2012-01-15 NOTE — Progress Notes (Signed)
Patient ID: [name], female   DOB: 1950/12/26, 61 y.o.   MRN: [id number] Pt called requesting mammogram result for 01/11/12, left message on voicemail result are not in system yet.

## 2012-02-06 ENCOUNTER — Encounter (INDEPENDENT_AMBULATORY_CARE_PROVIDER_SITE_OTHER): Payer: Self-pay | Admitting: General Surgery

## 2012-02-06 ENCOUNTER — Ambulatory Visit (INDEPENDENT_AMBULATORY_CARE_PROVIDER_SITE_OTHER): Payer: PRIVATE HEALTH INSURANCE | Admitting: General Surgery

## 2012-02-06 ENCOUNTER — Telehealth (INDEPENDENT_AMBULATORY_CARE_PROVIDER_SITE_OTHER): Payer: Self-pay | Admitting: General Surgery

## 2012-02-06 VITALS — BP 134/84 | HR 78 | Temp 98.5°F | Resp 18 | Ht 64.0 in | Wt 211.0 lb

## 2012-02-06 DIAGNOSIS — Z9089 Acquired absence of other organs: Secondary | ICD-10-CM

## 2012-02-06 DIAGNOSIS — Z9049 Acquired absence of other specified parts of digestive tract: Secondary | ICD-10-CM
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
## 2012-02-06 NOTE — Telephone Encounter (Signed)
Patient called back and can not make it at 2:15pm. She will keep appt at 4:00 pm per Lawson Fiscal.

## 2012-02-06 NOTE — Telephone Encounter (Signed)
LMOM to ask patient if she could be here at 02/06/12 2:15 for today's appt...ask her to return my call Lawson Fiscal T

## 2012-03-27 ENCOUNTER — Observation Stay (HOSPITAL_COMMUNITY)
Admission: EM | Admit: 2012-03-27 | Discharge: 2012-03-28 | Disposition: A | Discharge status: Home / Self Care | Payer: Medicare Other | Attending: Internal Medicine | Admitting: Internal Medicine

## 2012-03-27 ENCOUNTER — Encounter (HOSPITAL_COMMUNITY): Payer: Self-pay | Admitting: Emergency Medicine

## 2012-03-27 ENCOUNTER — Emergency Department (HOSPITAL_COMMUNITY): Payer: Medicare Other

## 2012-03-27 DIAGNOSIS — R609 Edema, unspecified: Secondary | ICD-10-CM

## 2012-03-27 DIAGNOSIS — E049 Nontoxic goiter, unspecified: Secondary | ICD-10-CM

## 2012-03-27 DIAGNOSIS — F411 Generalized anxiety disorder: Secondary | ICD-10-CM | POA: Diagnosis present

## 2012-03-27 DIAGNOSIS — J449 Chronic obstructive pulmonary disease, unspecified: Secondary | ICD-10-CM

## 2012-03-27 DIAGNOSIS — K279 Peptic ulcer, site unspecified, unspecified as acute or chronic, without hemorrhage or perforation: Secondary | ICD-10-CM

## 2012-03-27 DIAGNOSIS — F3289 Other specified depressive episodes: Secondary | ICD-10-CM | POA: Insufficient documentation

## 2012-03-27 DIAGNOSIS — IMO0002 Reserved for concepts with insufficient information to code with codable children: Secondary | ICD-10-CM

## 2012-03-27 DIAGNOSIS — Z0000 Encounter for general adult medical examination without abnormal findings: Secondary | ICD-10-CM

## 2012-03-27 DIAGNOSIS — E669 Obesity, unspecified: Secondary | ICD-10-CM

## 2012-03-27 DIAGNOSIS — I10 Essential (primary) hypertension: Secondary | ICD-10-CM | POA: Diagnosis present

## 2012-03-27 DIAGNOSIS — F329 Major depressive disorder, single episode, unspecified: Secondary | ICD-10-CM | POA: Insufficient documentation

## 2012-03-27 DIAGNOSIS — M549 Dorsalgia, unspecified: Secondary | ICD-10-CM

## 2012-03-27 DIAGNOSIS — Z8679 Personal history of other diseases of the circulatory system: Secondary | ICD-10-CM

## 2012-03-27 DIAGNOSIS — E041 Nontoxic single thyroid nodule: Secondary | ICD-10-CM

## 2012-03-27 DIAGNOSIS — N879 Dysplasia of cervix uteri, unspecified: Secondary | ICD-10-CM

## 2012-03-27 DIAGNOSIS — R1010 Upper abdominal pain, unspecified: Secondary | ICD-10-CM

## 2012-03-27 DIAGNOSIS — T148XXA Other injury of unspecified body region, initial encounter: Secondary | ICD-10-CM

## 2012-03-27 DIAGNOSIS — R0789 Other chest pain: Secondary | ICD-10-CM | POA: Diagnosis present

## 2012-03-27 DIAGNOSIS — R42 Dizziness and giddiness: Secondary | ICD-10-CM

## 2012-03-27 DIAGNOSIS — K5900 Constipation, unspecified: Secondary | ICD-10-CM

## 2012-03-27 DIAGNOSIS — I059 Rheumatic mitral valve disease, unspecified: Secondary | ICD-10-CM | POA: Insufficient documentation

## 2012-03-27 DIAGNOSIS — E7800 Pure hypercholesterolemia, unspecified: Secondary | ICD-10-CM | POA: Insufficient documentation

## 2012-03-27 DIAGNOSIS — E785 Hyperlipidemia, unspecified: Secondary | ICD-10-CM

## 2012-03-27 DIAGNOSIS — J309 Allergic rhinitis, unspecified: Secondary | ICD-10-CM

## 2012-03-27 DIAGNOSIS — IMO0001 Reserved for inherently not codable concepts without codable children: Secondary | ICD-10-CM | POA: Insufficient documentation

## 2012-03-27 DIAGNOSIS — Z86010 Personal history of colonic polyps: Secondary | ICD-10-CM

## 2012-03-27 DIAGNOSIS — G894 Chronic pain syndrome: Secondary | ICD-10-CM | POA: Insufficient documentation

## 2012-03-27 DIAGNOSIS — R079 Chest pain, unspecified: Principal | ICD-10-CM | POA: Insufficient documentation

## 2012-03-27 DIAGNOSIS — E039 Hypothyroidism, unspecified: Secondary | ICD-10-CM | POA: Diagnosis present

## 2012-03-27 DIAGNOSIS — J45909 Unspecified asthma, uncomplicated: Secondary | ICD-10-CM | POA: Insufficient documentation

## 2012-03-27 DIAGNOSIS — M1990 Unspecified osteoarthritis, unspecified site: Secondary | ICD-10-CM

## 2012-03-27 DIAGNOSIS — M810 Age-related osteoporosis without current pathological fracture: Secondary | ICD-10-CM

## 2012-03-27 DIAGNOSIS — K219 Gastro-esophageal reflux disease without esophagitis: Secondary | ICD-10-CM | POA: Diagnosis present

## 2012-03-27 DIAGNOSIS — R7302 Impaired glucose tolerance (oral): Secondary | ICD-10-CM

## 2012-03-27 DIAGNOSIS — I872 Venous insufficiency (chronic) (peripheral): Secondary | ICD-10-CM

## 2012-03-27 DIAGNOSIS — Z79899 Other long term (current) drug therapy: Secondary | ICD-10-CM | POA: Insufficient documentation

## 2012-03-27 DIAGNOSIS — Z800 Family history of malignant neoplasm of digestive organs: Secondary | ICD-10-CM

## 2012-03-27 DIAGNOSIS — K589 Irritable bowel syndrome without diarrhea: Secondary | ICD-10-CM

## 2012-03-27 DIAGNOSIS — R5383 Other fatigue: Secondary | ICD-10-CM

## 2012-03-27 DIAGNOSIS — G43909 Migraine, unspecified, not intractable, without status migrainosus: Secondary | ICD-10-CM | POA: Insufficient documentation

## 2012-03-27 DIAGNOSIS — G4733 Obstructive sleep apnea (adult) (pediatric): Secondary | ICD-10-CM

## 2012-03-27 DIAGNOSIS — K1370 Unspecified lesions of oral mucosa: Secondary | ICD-10-CM

## 2012-03-27 DIAGNOSIS — D573 Sickle-cell trait: Secondary | ICD-10-CM

## 2012-03-27 DIAGNOSIS — R21 Rash and other nonspecific skin eruption: Secondary | ICD-10-CM

## 2012-03-27 LAB — URINALYSIS, ROUTINE W REFLEX MICROSCOPIC
Bilirubin Urine: NEGATIVE
Glucose, UA: NEGATIVE mg/dL
Hgb urine dipstick: NEGATIVE
Ketones, ur: NEGATIVE mg/dL
Protein, ur: NEGATIVE mg/dL
Urobilinogen, UA: 0.2 mg/dL (ref 0.0–1.0)

## 2012-03-27 LAB — PHOSPHORUS: Phosphorus: 3 mg/dL (ref 2.3–4.6)

## 2012-03-27 LAB — CBC WITH DIFFERENTIAL/PLATELET
Basophils Absolute: 0 10*3/uL (ref 0.0–0.1)
Eosinophils Absolute: 0.1 10*3/uL (ref 0.0–0.7)
Hemoglobin: 16.7 g/dL — ABNORMAL HIGH (ref 12.0–15.0)
Lymphocytes Relative: 27 % (ref 12–46)
Lymphs Abs: 2.2 10*3/uL (ref 0.7–4.0)
MCH: 29.6 pg (ref 26.0–34.0)
MCHC: 35 g/dL (ref 30.0–36.0)
Monocytes Absolute: 0.3 10*3/uL (ref 0.1–1.0)
Neutrophils Relative %: 68 % (ref 43–77)
Platelets: 232 10*3/uL (ref 150–400)
RDW: 13.6 % (ref 11.5–15.5)

## 2012-03-27 LAB — BASIC METABOLIC PANEL
Calcium: 9.2 mg/dL (ref 8.4–10.5)
GFR calc Af Amer: >90 mL/min (ref >90)
GFR calc non Af Amer: >90 mL/min (ref >90)
Sodium: 140 mEq/L (ref 135–145)

## 2012-03-27 LAB — MAGNESIUM: Magnesium: 2.1 mg/dL (ref 1.5–2.5)

## 2012-03-27 LAB — TROPONIN I: Troponin I: <0.3 ng/mL (ref <0.30)

## 2012-03-27 MED ORDER — HEPARIN SODIUM (PORCINE) 5000 UNIT/ML IJ SOLN
5000.0000 [IU] | Freq: Three times a day (TID) | INTRAMUSCULAR | Status: DC
  Administered 2012-03-27 – 2012-03-28 (×3): 5000 [IU] via SUBCUTANEOUS
  Filled 2012-03-27 (×5): qty 1

## 2012-03-27 MED ORDER — MORPHINE SULFATE 2 MG/ML IJ SOLN
2.0000 mg | INTRAMUSCULAR | Status: DC | PRN
  Administered 2012-03-28: 2 mg via INTRAVENOUS
  Filled 2012-03-27: qty 1

## 2012-03-27 MED ORDER — THYROID 30 MG PO TABS
30.0000 mg | ORAL_TABLET | Freq: Every day | ORAL | Status: DC
  Administered 2012-03-28: 30 mg via ORAL
  Filled 2012-03-27 (×2): qty 1

## 2012-03-27 MED ORDER — FENTANYL 25 MCG/HR TD PT72
25.0000 ug | MEDICATED_PATCH | TRANSDERMAL | Status: DC
  Administered 2012-03-27: 25 ug via TRANSDERMAL
  Filled 2012-03-27: qty 1

## 2012-03-27 MED ORDER — MORPHINE SULFATE 4 MG/ML IJ SOLN
4.0000 mg | Freq: Once | INTRAMUSCULAR | Status: AC
  Administered 2012-03-27: 4 mg via INTRAVENOUS
  Filled 2012-03-27: qty 1

## 2012-03-27 MED ORDER — ASPIRIN EC 81 MG PO TBEC
81.0000 mg | DELAYED_RELEASE_TABLET | Freq: Every day | ORAL | Status: DC
  Administered 2012-03-27 – 2012-03-28 (×2): 81 mg via ORAL
  Filled 2012-03-27 (×2): qty 1

## 2012-03-27 MED ORDER — ACETAMINOPHEN 650 MG RE SUPP: 650.0000 mg | Freq: Four times a day (QID) | RECTAL | Status: DC | PRN

## 2012-03-27 MED ORDER — SODIUM CHLORIDE 0.9 % IV SOLN
INTRAVENOUS | Status: DC
  Administered 2012-03-27: 21:00:00 via INTRAVENOUS

## 2012-03-27 MED ORDER — NITROGLYCERIN 0.4 MG SL SUBL
0.4000 mg | SUBLINGUAL_TABLET | SUBLINGUAL | Status: DC | PRN
  Administered 2012-03-27 (×2): 0.4 mg via SUBLINGUAL
  Filled 2012-03-27: qty 25

## 2012-03-27 MED ORDER — PANTOPRAZOLE SODIUM 40 MG PO TBEC
40.0000 mg | DELAYED_RELEASE_TABLET | Freq: Every day | ORAL | Status: DC
  Administered 2012-03-27 – 2012-03-28 (×2): 40 mg via ORAL
  Filled 2012-03-27 (×2): qty 1

## 2012-03-27 MED ORDER — SODIUM CHLORIDE 0.9 % IJ SOLN
3.0000 mL | Freq: Two times a day (BID) | INTRAMUSCULAR | Status: DC
  Administered 2012-03-28: 3 mL via INTRAVENOUS

## 2012-03-27 MED ORDER — ACETAMINOPHEN 325 MG PO TABS
650.0000 mg | ORAL_TABLET | Freq: Four times a day (QID) | ORAL | Status: DC | PRN
  Administered 2012-03-28: 650 mg via ORAL

## 2012-03-27 NOTE — H&P (Signed)
[facility] History and Physical  ETHAL GOTAY JYN:[id number] DOB: [birth year] DOA: 03/27/2012  Referring physician: Bernette Mayers PCP: Oliver Barre, MD   Chief Complaint: Chest pain  HPI: [name] is a 62 y.o. female with history of fibromyalgia, chronic pain syndrome and asthma. Patient is on chronic fentanyl patch came in to the hospital because of left-sided chest pain. Patient said she was in her usual state of health this morning when she woke up at 6, at 6:30 she developed left-sided chest pain, it was 7/10 in intensity, sharp, constant, she did not notice anything making the pain worse. The pain got better when she came in to the emergency department she had nitroglycerin and morphine. The pain lasted from about 6:30 AM till the evening when she had the morphine. Pain now comes on and off. Initial evaluation in the emergency department showed negative EKG and cardiac enzymes. Patient will be admitted to rule out ACS.   Review of Systems: Constitutional: negative for anorexia, fevers and sweats Eyes: negative for irritation, redness and visual disturbance Ears, nose, mouth, throat, and face: negative for earaches, epistaxis, nasal congestion and sore throat Respiratory: negative for cough, dyspnea on exertion, sputum and wheezing Cardiovascular: Chest pain, denies palpitations shortness of breath or orthopnea Gastrointestinal: negative for abdominal pain, constipation, diarrhea, melena, nausea and vomiting Genitourinary:negative for dysuria, frequency and hematuria Hematologic/lymphatic: negative for bleeding, easy bruising and lymphadenopathy Musculoskeletal:negative for arthralgias, muscle weakness and stiff joints Neurological: negative for coordination problems, gait problems, headaches and weakness Endocrine: negative for diabetic symptoms including polydipsia, polyuria and weight loss Allergic/Immunologic: negative for anaphylaxis, hay fever and urticaria   Past  Medical History  Diagnosis Date  . Endometriosis   . Osteopenia   . Asthma   . Fibromyalgia   . Thyroid disease     Hypothyroid  . Elevated cholesterol   . Migraines   . Bowel obstruction   . GERD (gastroesophageal reflux disease)   . Impaired glucose tolerance 02/25/2011  . Complication of anesthesia     "I have the shakes"  . Heart murmur     mvp  . Shortness of breath     with exertion  . Arthritis   . Low back pain   . Hx of oral aphthous ulcers   . Biliary dyskinesia   . Diverticulosis   . Nocturia   . Hypothyroidism     hx nodule on thyroid   . Sleep apnea     has not used c-pap x 1 yr.   . Depression   . Cervical dysplasia    Past Surgical History  Procedure Laterality Date  . Tonsillectomy    . Pelvic laparoscopy  1995,2002    DL LSO DL RSO  . Vaginal hysterectomy  1983  . Oophorectomy      LSO-BSO  . Breast surgery      Benign breast cyst  . Cholecystectomy  12/05/2011    Procedure: LAPAROSCOPIC CHOLECYSTECTOMY;  Surgeon: Axel Filler, MD;  Location: [facility]S;  Service: General;  Laterality: N/A;  . Colposcopy    . Gynecologic cryosurgery     Social History:  reports that she has never smoked. She has never used smokeless tobacco. She reports that  drinks alcohol. She reports that she does not use illicit drugs.   Allergies  Allergen Reactions  . Celecoxib   . Codeine     REACTION: Nausea  . Duloxetine     REACTION: n/v  . Erythromycin     REACTION:  Itching  . Escitalopram Oxalate Nausea Only  . Ezetimibe   . Iohexol      Desc: VIOX, CELEBREX, EMYCIN   . Milnacipran     REACTION: n/v  . Pregabalin     REACTION: wt gain  . Rofecoxib   . Statins     Family History  Problem Relation Age of Onset  . Hypertension Mother   . Heart disease Mother   . Breast cancer Mother     Age 24  . Cancer Mother     Bonecancer  . Hypertension Father   . Heart disease Father   . Diabetes Father   . Colon cancer Sister   . Sarcoidosis Sister   .  Cancer Sister     colon  . Breast cancer Maternal Grandmother     Age 84  . Breast cancer Paternal Grandmother     Age unknown  . Ovarian cancer Paternal Grandmother   . Sarcoidosis Brother   . Breast cancer Maternal Aunt     Age 26's  . Breast cancer Maternal Aunt     Age 28    Prior to Admission medications   Medication Sig Start Date End Date Taking? Authorizing Provider  albuterol (PROVENTIL HFA;VENTOLIN HFA) 108 (90 BASE) MCG/ACT inhaler Inhale 2 puffs into the lungs every 6 (six) hours as needed. For wheezing   Yes Historical Provider, MD  albuterol (PROVENTIL) (2.5 MG/3ML) 0.083% nebulizer solution Take 2.5 mg by nebulization every 6 (six) hours as needed. For wheezing   Yes Historical Provider, MD  Calcium Carbonate-Vitamin D (CALCIUM + D PO) Take 1 tablet by mouth daily.   Yes Historical Provider, MD  cyclobenzaprine (FLEXERIL) 10 MG tablet Take 1 tablet (10 mg total) by mouth 3 (three) times daily as needed for muscle spasms. 11/03/11  Yes Corwin Levins, MD  dicyclomine (BENTYL) 10 MG capsule Take 10 mg by mouth 4 (four) times daily -  before meals and at bedtime. As needed 02/27/11 03/27/12 Yes Corwin Levins, MD  Estradiol Acetate [facility]) 0.1 MG/24HR RING Place 0.1 each vaginally every 3 (three) months. 01/09/12  Yes Trellis Paganini, MD  fentaNYL (DURAGESIC - DOSED MCG/HR) 25 MCG/HR Place 1 patch onto the skin every 3 (three) days.   Yes Historical Provider, MD  FIBER SELECT GUMMIES PO Take 2 tablets by mouth 2 (two) times daily.   Yes Historical Provider, MD  fluticasone (FLONASE) 50 MCG/ACT nasal spray Place 2 sprays into the nose daily. 05/30/11 05/29/12 Yes Corwin Levins, MD  hydrocortisone butyrate (LUCOID) 0.1 % CREA cream Apply topically 2 (two) times daily.   Yes Historical Provider, MD  lansoprazole (PREVACID) 30 MG capsule Take 30 mg by mouth daily.   Yes Historical Provider, MD  Multiple Vitamins-Minerals (MULTIVITAMIN WITH MINERALS) tablet Take 1 tablet by mouth daily.    Yes Historical Provider, MD  Pitavastatin Calcium (LIVALO) 4 MG TABS Take 1 tablet by mouth at bedtime.    Yes Historical Provider, MD  polyethylene glycol powder (MIRALAX) powder Take 17 g by mouth as needed. For constipation   Yes Historical Provider, MD  testosterone (ANDROGEL) 50 MG/5GM GEL Place 5 g onto the skin daily. Cream vag @@ Hs   Yes Historical Provider, MD  thyroid (ARMOUR THYROID) 30 MG tablet Take 1 tablet (30 mg total) by mouth daily. 02/27/11 03/27/12 Yes Corwin Levins, MD  tiZANidine (ZANAFLEX) 4 MG tablet Take 4 mg by mouth every 6 (six) hours as needed. For headaches  Yes Historical Provider, MD  topiramate (TOPAMAX) 100 MG tablet Take 100 mg by mouth 2 (two) times daily.   Yes Historical Provider, MD   Physical Exam: Filed Vitals:   03/27/12 1615 03/27/12 1745 03/27/12 1800 03/27/12 1815  BP: 118/73 131/69 141/75 140/67  Pulse: 60 57 58 61  Resp: 18 19 14 17   SpO2: 100% 99% 99% 99%   General appearance: alert, cooperative and no distress  Head: Normocephalic, without obvious abnormality, atraumatic  Eyes: conjunctivae/corneas clear. PERRL, EOM's intact. Fundi benign.  Nose: Nares normal. Septum midline. Mucosa normal. No drainage or sinus tenderness.  Throat: lips, mucosa, and tongue normal; teeth and gums normal  Neck: Supple, no masses, no cervical lymphadenopathy, no JVD appreciated, no meningeal signs Resp: clear to auscultation bilaterally  Chest wall: no tenderness  Cardio: regular rate and rhythm, S1, S2 normal, no murmur, click, rub or gallop  GI: soft, non-tender; bowel sounds normal; no masses, no organomegaly  Extremities: extremities normal, atraumatic, no cyanosis or edema  Skin: Skin color, texture, turgor normal. No rashes or lesions  Neurologic: Alert and oriented X 3, normal strength and tone. Normal symmetric reflexes. Normal coordination and gait  Labs on Admission:  Basic Metabolic Panel:  Recent Labs Lab 03/27/12 1616  NA 140  K 4.4  CL 103   CO2 26  GLUCOSE 79  BUN 14  CREATININE 0.69  CALCIUM 9.2   Liver Function Tests: No results found for this basename: AST, ALT, ALKPHOS, BILITOT, PROT, ALBUMIN,  in the last 168 hours No results found for this basename: LIPASE, AMYLASE,  in the last 168 hours No results found for this basename: AMMONIA,  in the last 168 hours CBC:  Recent Labs Lab 03/27/12 1616  WBC 8.1  NEUTROABS 5.5  HGB 16.7*  HCT 47.7*  MCV 84.6  PLT 232   Cardiac Enzymes:  Recent Labs Lab 03/27/12 1616  TROPONINI <0.30    BNP (last 3 results) No results found for this basename: PROBNP,  in the last 8760 hours CBG: No results found for this basename: GLUCAP,  in the last 168 hours  Radiological Exams on Admission: Dg Chest 2 View  03/27/2012  *RADIOLOGY REPORT*  Clinical Data: 62 year old female chest pain shortness of breath.  CHEST - 2 VIEW  Comparison: 12/01/2011 and earlier.  Findings: Slightly lower lung volumes.  Cardiac size at the upper limits of normal.  Stable cardiac size and mediastinal contours. Visualized tracheal air column is within normal limits.  No pneumothorax, pulmonary edema, pleural effusion or acute pulmonary opacity. No acute osseous abnormality identified.  IMPRESSION: No acute cardiopulmonary abnormality.   Original Report Authenticated By: Erskine Speed, M.D.     EKG: Independently reviewed.   Assessment/Plan Principal Problem:   Chest pain Active Problems:   HYPOTHYROIDISM   ANXIETY   HYPERTENSION   GERD   Chest pain -Atypical chest pain, left sided, sharp and relieved by morphine. -Patient does not have recent heart evaluation, she had normal stress test in 2007. -12-lead EKG and cardiac enzymes are negative. The whole cycle 3 sets of cardiac enzymes repeat EKG in a.m. -3 sets of troponin are negative I will obtain stress test. -Based on the stress test result patient can be discharged home on cardiology can be consulted.  Hypothyroidism -Patient is on Armour  Thyroid, continue and check TSH.  Fibromyalgia and chronic pain -Patient is on fentanyl patch, continue.  GERD -Patient is on Prilosec, substituted by Protonix.  Code Status: Full code  Family Communication: Discussed with her and her brother who is at bedside. Disposition Plan: Observation, likely to stay overnight.  Time spent: 70 minutes  [facility] A [facility] Pager [phone]  If 7PM-7AM, please contact night-coverage [email or web address] Password [facility] 03/27/2012, 7:28 PM

## 2012-03-27 NOTE — ED Provider Notes (Signed)
History     CSN: [id number]  Arrival date & time 03/27/12  1437   First MD Initiated Contact with Patient 03/27/12 1542      Chief Complaint  Patient presents with  . Chest Pain    (Consider location/radiation/quality/duration/timing/severity/associated sxs/prior treatment) Patient is a 62 y.o. female presenting with chest pain.  Chest Pain  Pt with multiple medical problems but no known CAD reports aching L sided chest pain since earlier this morning, associated with SOB, No particular provoking or relieving factors. She had stress test about 2 years ago which was reportedly negative.   Past Medical History  Diagnosis Date  . Endometriosis   . Osteopenia   . Asthma   . Fibromyalgia   . Thyroid disease     Hypothyroid  . Elevated cholesterol   . Migraines   . Bowel obstruction   . GERD (gastroesophageal reflux disease)   . Impaired glucose tolerance 02/25/2011  . Complication of anesthesia     "I have the shakes"  . Heart murmur     mvp  . Shortness of breath     with exertion  . Arthritis   . Low back pain   . Hx of oral aphthous ulcers   . Biliary dyskinesia   . Diverticulosis   . Nocturia   . Hypothyroidism     hx nodule on thyroid   . Sleep apnea     has not used c-pap x 1 yr.   . Depression   . Cervical dysplasia     Past Surgical History  Procedure Laterality Date  . Tonsillectomy    . Pelvic laparoscopy  1995,2002    DL LSO DL RSO  . Vaginal hysterectomy  1983  . Oophorectomy      LSO-BSO  . Breast surgery      Benign breast cyst  . Cholecystectomy  12/05/2011    Procedure: LAPAROSCOPIC CHOLECYSTECTOMY;  Surgeon: Axel Filler, MD;  Location: [facility];  Service: General;  Laterality: N/A;  . Colposcopy    . Gynecologic cryosurgery      Family History  Problem Relation Age of Onset  . Hypertension Mother   . Heart disease Mother   . Breast cancer Mother     Age 3  . Cancer Mother     Bonecancer  . Hypertension Father   . Heart disease  Father   . Diabetes Father   . Colon cancer Sister   . Sarcoidosis Sister   . Cancer Sister     colon  . Breast cancer Maternal Grandmother     Age 45  . Breast cancer Paternal Grandmother     Age unknown  . Ovarian cancer Paternal Grandmother   . Sarcoidosis Brother   . Breast cancer Maternal Aunt     Age 78's  . Breast cancer Maternal Aunt     Age 21    History  Substance Use Topics  . Smoking status: Never Smoker   . Smokeless tobacco: Never Used  . Alcohol Use: 0.0 oz/week     Comment: socially    OB History   Grav Para Term Preterm Abortions TAB SAB Ect Mult Living   3 2 2  1     2       Review of Systems  Cardiovascular: Positive for chest pain.   All other systems reviewed and are negative except as noted in HPI.    Allergies  Celecoxib; Codeine; Duloxetine; Erythromycin; Escitalopram oxalate; Ezetimibe; Iohexol; Milnacipran; Pregabalin; Rofecoxib; and  Statins  Home Medications   Current Outpatient Rx  Name  Route  Sig  Dispense  Refill  . albuterol (PROVENTIL HFA;VENTOLIN HFA) 108 (90 BASE) MCG/ACT inhaler   Inhalation   Inhale 2 puffs into the lungs every 6 (six) hours as needed. For wheezing         . albuterol (PROVENTIL) (2.5 MG/3ML) 0.083% nebulizer solution   Nebulization   Take 2.5 mg by nebulization every 6 (six) hours as needed. For wheezing         . Calcium Carbonate-Vitamin D (CALCIUM + D PO)   Oral   Take 1 tablet by mouth daily.         . cyclobenzaprine (FLEXERIL) 10 MG tablet   Oral   Take 1 tablet (10 mg total) by mouth 3 (three) times daily as needed for muscle spasms.   90 tablet   1   . dicyclomine (BENTYL) 10 MG capsule   Oral   Take 10 mg by mouth 4 (four) times daily -  before meals and at bedtime. As needed         . Estradiol Acetate (FEMRING) 0.1 MG/24HR RING   Vaginal   Place 0.1 each vaginally every 3 (three) months.   0.9 each   3   . fentaNYL (DURAGESIC - DOSED MCG/HR) 25 MCG/HR   Transdermal    Place 1 patch onto the skin every 3 (three) days.         Marland Kitchen FIBER SELECT GUMMIES PO   Oral   Take 2 tablets by mouth 2 (two) times daily.         . fluticasone (FLONASE) 50 MCG/ACT nasal spray   Nasal   Place 2 sprays into the nose daily.   16 g   2   . hydrocortisone butyrate (LUCOID) 0.1 % CREA cream   Topical   Apply topically 2 (two) times daily.         . lansoprazole (PREVACID) 30 MG capsule   Oral   Take 30 mg by mouth daily.         . Multiple Vitamins-Minerals (MULTIVITAMIN WITH MINERALS) tablet   Oral   Take 1 tablet by mouth daily.         . Pitavastatin Calcium (LIVALO) 4 MG TABS   Oral   Take 1 tablet by mouth at bedtime.          . polyethylene glycol powder (MIRALAX) powder   Oral   Take 17 g by mouth as needed. For constipation         . testosterone (ANDROGEL) 50 MG/5GM GEL   Transdermal   Place 5 g onto the skin daily. Cream vag @@ Hs         . thyroid (ARMOUR THYROID) 30 MG tablet   Oral   Take 1 tablet (30 mg total) by mouth daily.   90 tablet   3   . tiZANidine (ZANAFLEX) 4 MG tablet   Oral   Take 4 mg by mouth every 6 (six) hours as needed. For headaches         . topiramate (TOPAMAX) 100 MG tablet   Oral   Take 100 mg by mouth 2 (two) times daily.           BP 140/67  Pulse 61  Resp 17  SpO2 99%  Physical Exam  Nursing note and vitals reviewed. Constitutional: She is oriented to person, place, and time. She appears well-developed and well-nourished.  HENT:  Head: Normocephalic and atraumatic.  Eyes: EOM are normal. Pupils are equal, round, and reactive to light.  Neck: Normal range of motion. Neck supple.  Cardiovascular: Normal rate, normal heart sounds and intact distal pulses.   Pulmonary/Chest: Effort normal and breath sounds normal.  Abdominal: Bowel sounds are normal. She exhibits no distension. There is no tenderness.  Musculoskeletal: Normal range of motion. She exhibits no edema and no tenderness.   Neurological: She is alert and oriented to person, place, and time. She has normal strength. No cranial nerve deficit or sensory deficit.  Skin: Skin is warm and dry. No rash noted.  Psychiatric: She has a normal mood and affect.    ED Course  Procedures (including critical care time)  Labs Reviewed  CBC WITH DIFFERENTIAL - Abnormal; Notable for the following:    RBC 5.64 (*)    Hemoglobin 16.7 (*)    HCT 47.7 (*)    All other components within normal limits  BASIC METABOLIC PANEL  TROPONIN I   Dg Chest 2 View  03/27/2012  *RADIOLOGY REPORT*  Clinical Data: 62 year old female chest pain shortness of breath.  CHEST - 2 VIEW  Comparison: 12/01/2011 and earlier.  Findings: Slightly lower lung volumes.  Cardiac size at the upper limits of normal.  Stable cardiac size and mediastinal contours. Visualized tracheal air column is within normal limits.  No pneumothorax, pulmonary edema, pleural effusion or acute pulmonary opacity. No acute osseous abnormality identified.  IMPRESSION: No acute cardiopulmonary abnormality.   Original Report Authenticated By: Erskine Speed, M.D.      1. Chest pain   2. Anxiety state, unspecified   3. Cervical dysplasia   4. Chronic airway obstruction, not elsewhere classified   5. Goiter, unspecified       MDM   Date: 03/27/2012  Rate: 63  Rhythm: normal sinus rhythm  QRS Axis: normal  Intervals: normal  ST/T Wave abnormalities: normal  Conduction Disutrbances: none  Narrative Interpretation: unremarkable   Atypical pain improved some with NTG and resolved with morphine, low risk patient. Will admit for rule out.          Charles B. Bernette Mayers, MD 03/27/12 2001

## 2012-03-27 NOTE — ED Notes (Signed)
Pt states she had flu or pneumonia about 3wks ago. Pt also states she has been having some rt flank pain for the last two days. Pt states pain in on left side behind her left breast, hurts when palpated, with deep breaths and with movement. Rates pain 7/10.

## 2012-03-27 NOTE — ED Notes (Addendum)
Per EMS pt got into bath tub around 0630-0700 this morning and began having severe sharp lt chest pain with diaphoresis, denies n/v, and sob. Pt went to [facility] as a walk in and was sent here. Pt took 2 baby aspirins prior to ems arrival, was given two more. Pain increases with movement, no radiation. Pt has had chest wall pain before but this feels different. Pt has hx of HTN initial b/p was 184/120 pt states she was taken off b/p meds in December because b/p was normal. Last set of vitals cbg 78, b/p 157/92, 100% RA. 20g RAC. Pt has fentanyl patch for fibromyalgia.

## 2012-03-27 NOTE — ED Notes (Signed)
Pt transported to xray 

## 2012-03-28 ENCOUNTER — Observation Stay (HOSPITAL_COMMUNITY): Payer: Medicare Other

## 2012-03-28 DIAGNOSIS — R079 Chest pain, unspecified: Secondary | ICD-10-CM

## 2012-03-28 LAB — CBC
MCH: 29.6 pg (ref 26.0–34.0)
MCHC: 34.9 g/dL (ref 30.0–36.0)
MCV: 84.9 fL (ref 78.0–100.0)
Platelets: 264 10*3/uL (ref 150–400)
RBC: 5.37 MIL/uL — ABNORMAL HIGH (ref 3.87–5.11)

## 2012-03-28 LAB — HEMOGLOBIN A1C: Hgb A1c MFr Bld: 5.3 % (ref <5.7)

## 2012-03-28 LAB — BASIC METABOLIC PANEL
BUN: 14 mg/dL (ref 6–23)
CO2: 28 mEq/L (ref 19–32)
Calcium: 8.6 mg/dL (ref 8.4–10.5)
Creatinine, Ser: 0.74 mg/dL (ref 0.50–1.10)
Glucose, Bld: 85 mg/dL (ref 70–99)
Sodium: 140 mEq/L (ref 135–145)

## 2012-03-28 LAB — D-DIMER, QUANTITATIVE: D-Dimer, Quant: 0.32 ug/mL-FEU (ref 0.00–0.48)

## 2012-03-28 LAB — TSH: TSH: 3.793 u[IU]/mL (ref 0.350–4.500)

## 2012-03-28 MED ORDER — TECHNETIUM TC 99M SESTAMIBI GENERIC - CARDIOLITE
30.0000 | Freq: Once | INTRAVENOUS | Status: AC | PRN
  Administered 2012-03-28: 30 via INTRAVENOUS

## 2012-03-28 MED ORDER — POTASSIUM CHLORIDE CRYS ER 20 MEQ PO TBCR
20.0000 meq | EXTENDED_RELEASE_TABLET | Freq: Once | ORAL | Status: AC
  Administered 2012-03-28: 20 meq via ORAL
  Filled 2012-03-28: qty 1

## 2012-03-28 MED ORDER — TECHNETIUM TC 99M SESTAMIBI GENERIC - CARDIOLITE
10.0000 | Freq: Once | INTRAVENOUS | Status: AC | PRN
  Administered 2012-03-28: 10 via INTRAVENOUS

## 2012-03-28 MED ORDER — REGADENOSON 0.4 MG/5ML IV SOLN
0.4000 mg | Freq: Once | INTRAVENOUS | Status: AC
  Administered 2012-03-28: 0.4 mg via INTRAVENOUS

## 2012-03-28 MED ORDER — ASPIRIN 81 MG PO TBEC: 81.0000 mg | DELAYED_RELEASE_TABLET | Freq: Every day | ORAL | Status: AC

## 2012-03-28 NOTE — Progress Notes (Signed)
Utilization review completed. Bertha Stanfill, RN, BSN. 

## 2012-03-28 NOTE — Progress Notes (Signed)
Lexiscan Myoview performed. Results pending. Theodore Demark, PA-C

## 2012-03-28 NOTE — Progress Notes (Signed)
Pt HR decreased to 49 non-sustained.  Pt. Noted to be in second degree type I heart block during decreased heart rate then converted back to NSR.  Pt. Going between second degree type I and NSR on telemetry.  VSS.  BP 123/ 74 HR 68 O2 99% RA.  Pt denies any CP, SOB or Palpitations. MD notified.  WIll continue to monitor.

## 2012-03-28 NOTE — Discharge Summary (Signed)
Physician Discharge Summary  [name] MRN: [id number] DOB/AGE: September 22, 1950 62 y.o.  PCP: Oliver Barre, MD   Admit date: 03/27/2012 Discharge date: 03/28/2012  Discharge Diagnoses:    Atypical chest pain   HYPOTHYROIDISM   ANXIETY   HYPERTENSION   GERD     Medication List    TAKE these medications       albuterol (2.5 MG/3ML) 0.083% nebulizer solution  Commonly known as:  PROVENTIL  Take 2.5 mg by nebulization every 6 (six) hours as needed. For wheezing     albuterol 108 (90 BASE) MCG/ACT inhaler  Commonly known as:  PROVENTIL HFA;VENTOLIN HFA  Inhale 2 puffs into the lungs every 6 (six) hours as needed. For wheezing     aspirin 81 MG EC tablet  Take 1 tablet (81 mg total) by mouth daily.     CALCIUM + D PO  Take 1 tablet by mouth daily.     cyclobenzaprine 10 MG tablet  Commonly known as:  FLEXERIL  Take 1 tablet (10 mg total) by mouth 3 (three) times daily as needed for muscle spasms.     dicyclomine 10 MG capsule  Commonly known as:  BENTYL  Take 10 mg by mouth 4 (four) times daily -  before meals and at bedtime. As needed     Estradiol Acetate 0.1 MG/24HR Ring  Commonly known as:  FEMRING  Place 0.1 each vaginally every 3 (three) months.     fentaNYL 25 MCG/HR  Commonly known as:  DURAGESIC - dosed mcg/hr  Place 1 patch onto the skin every 3 (three) days.     FIBER SELECT GUMMIES PO  Take 2 tablets by mouth 2 (two) times daily.     fluticasone 50 MCG/ACT nasal spray  Commonly known as:  FLONASE  Place 2 sprays into the nose daily.     hydrocortisone butyrate 0.1 % Crea cream  Commonly known as:  LUCOID  Apply topically 2 (two) times daily.     lansoprazole 30 MG capsule  Commonly known as:  PREVACID  Take 30 mg by mouth daily.     LIVALO 4 MG Tabs  Generic drug:  Pitavastatin Calcium  Take 1 tablet by mouth at bedtime.     MIRALAX powder  Generic drug:  polyethylene glycol powder  Take 17 g by mouth as needed. For constipation      multivitamin with minerals tablet  Take 1 tablet by mouth daily.     testosterone 50 MG/5GM Gel  Commonly known as:  ANDROGEL  Place 5 g onto the skin daily. Cream vag @@ Hs     thyroid 30 MG tablet  Commonly known as:  ARMOUR THYROID  Take 1 tablet (30 mg total) by mouth daily.     tiZANidine 4 MG tablet  Commonly known as:  ZANAFLEX  Take 4 mg by mouth every 6 (six) hours as needed. For headaches     topiramate 100 MG tablet  Commonly known as:  TOPAMAX  Take 100 mg by mouth 2 (two) times daily.        Discharge Condition: stable  Disposition: 01-Home or Self Care   Consults: stable   Significant Diagnostic Studies: Dg Chest 2 View  03/27/2012  *RADIOLOGY REPORT*  Clinical Data: 62 year old female chest pain shortness of breath.  CHEST - 2 VIEW  Comparison: 12/01/2011 and earlier.  Findings: Slightly lower lung volumes.  Cardiac size at the upper limits of normal.  Stable cardiac size and mediastinal  contours. Visualized tracheal air column is within normal limits.  No pneumothorax, pulmonary edema, pleural effusion or acute pulmonary opacity. No acute osseous abnormality identified.  IMPRESSION: No acute cardiopulmonary abnormality.   Original Report Authenticated By: Erskine Speed, M.D.       Microbiology: No results found for this or any previous visit (from the past 240 hour(s)).   Labs: Results for orders placed during the hospital encounter of 03/27/12 (from the past 48 hour(s))  CBC WITH DIFFERENTIAL     Status: Abnormal   Collection Time    03/27/12  4:16 PM      Result Value Range   WBC 8.1  4.0 - 10.5 K/uL   Comment: WHITE COUNT CONFIRMED ON SMEAR   RBC 5.64 (*) 3.87 - 5.11 MIL/uL   Hemoglobin 16.7 (*) 12.0 - 15.0 g/dL   HCT 11.9 (*) 14.7 - 82.9 %   MCV 84.6  78.0 - 100.0 fL   MCH 29.6  26.0 - 34.0 pg   MCHC 35.0  30.0 - 36.0 g/dL   RDW 56.2  13.0 - 86.5 %   Platelets 232  150 - 400 K/uL   Comment: PLATELET COUNT CONFIRMED BY SMEAR   Neutrophils  Relative 68  43 - 77 %   Lymphocytes Relative 27  12 - 46 %   Monocytes Relative 4  3 - 12 %   Eosinophils Relative 1  0 - 5 %   Basophils Relative 0  0 - 1 %   RBC Morphology POLYCHROMASIA PRESENT     Comment: TEARDROP CELLS     FEW TARGETS   WBC Morphology VACUOLATED NEUTROPHILS     Neutro Abs 5.5  1.7 - 7.7 K/uL   Lymphs Abs 2.2  0.7 - 4.0 K/uL   Monocytes Absolute 0.3  0.1 - 1.0 K/uL   Eosinophils Absolute 0.1  0.0 - 0.7 K/uL   Basophils Absolute 0.0  0.0 - 0.1 K/uL  BASIC METABOLIC PANEL     Status: None   Collection Time    03/27/12  4:16 PM      Result Value Range   Sodium 140  135 - 145 mEq/L   Potassium 4.4  3.5 - 5.1 mEq/L   Comment: HEMOLYSIS AT THIS LEVEL MAY AFFECT RESULT   Chloride 103  96 - 112 mEq/L   CO2 26  19 - 32 mEq/L   Glucose, Bld 79  70 - 99 mg/dL   BUN 14  6 - 23 mg/dL   Creatinine, Ser 7.84  0.50 - 1.10 mg/dL   Calcium 9.2  8.4 - 69.6 mg/dL   GFR calc non Af Amer >90  >90 mL/min   GFR calc Af Amer >90  >90 mL/min   Comment:            The eGFR has been calculated     using the CKD EPI equation.     This calculation has not been     validated in all clinical     situations.     eGFR's persistently     <90 mL/min signify     possible Chronic Kidney Disease.  TROPONIN I     Status: None   Collection Time    03/27/12  4:16 PM      Result Value Range   Troponin I <0.30  <0.30 ng/mL   Comment:            Due to the release kinetics of cTnI,  a negative result within the first hours     of the onset of symptoms does not rule out     myocardial infarction with certainty.     If myocardial infarction is still suspected,     repeat the test at appropriate intervals.  URINALYSIS, ROUTINE W REFLEX MICROSCOPIC     Status: Abnormal   Collection Time    03/27/12  8:48 PM      Result Value Range   Color, Urine YELLOW  YELLOW   APPearance CLOUDY (*) CLEAR   Specific Gravity, Urine 1.019  1.005 - 1.030   pH 5.0  5.0 - 8.0   Glucose, UA NEGATIVE   NEGATIVE mg/dL   Hgb urine dipstick NEGATIVE  NEGATIVE   Bilirubin Urine NEGATIVE  NEGATIVE   Ketones, ur NEGATIVE  NEGATIVE mg/dL   Protein, ur NEGATIVE  NEGATIVE mg/dL   Urobilinogen, UA 0.2  0.0 - 1.0 mg/dL   Nitrite NEGATIVE  NEGATIVE   Leukocytes, UA NEGATIVE  NEGATIVE   Comment: MICROSCOPIC NOT DONE ON URINES WITH NEGATIVE PROTEIN, BLOOD, LEUKOCYTES, NITRITE, OR GLUCOSE <1000 mg/dL.  MAGNESIUM     Status: None   Collection Time    03/27/12  8:57 PM      Result Value Range   Magnesium 2.1  1.5 - 2.5 mg/dL  PHOSPHORUS     Status: None   Collection Time    03/27/12  8:57 PM      Result Value Range   Phosphorus 3.0  2.3 - 4.6 mg/dL  TSH     Status: None   Collection Time    03/27/12  8:57 PM      Result Value Range   TSH 3.793  0.350 - 4.500 uIU/mL  HEMOGLOBIN A1C     Status: None   Collection Time    03/27/12  8:57 PM      Result Value Range   Hemoglobin A1C 5.3  <5.7 %   Comment: (NOTE)                                                                               According to the ADA Clinical Practice Recommendations for 2011, when     HbA1c is used as a screening test:      >=6.5%   Diagnostic of Diabetes Mellitus               (if abnormal result is confirmed)     5.7-6.4%   Increased risk of developing Diabetes Mellitus     References:Diagnosis and Classification of Diabetes Mellitus,Diabetes     Care,2011,34(Suppl 1):S62-S69 and Standards of Medical Care in             Diabetes - 2011,Diabetes Care,2011,34 (Suppl 1):S11-S61.   Mean Plasma Glucose 105  <117 mg/dL  TROPONIN I     Status: None   Collection Time    03/27/12  9:00 PM      Result Value Range   Troponin I <0.30  <0.30 ng/mL   Comment:            Due to the release kinetics of cTnI,     a negative result within the first hours  of the onset of symptoms does not rule out     myocardial infarction with certainty.     If myocardial infarction is still suspected,     repeat the test at appropriate  intervals.  BASIC METABOLIC PANEL     Status: Abnormal   Collection Time    03/28/12  2:25 AM      Result Value Range   Sodium 140  135 - 145 mEq/L   Potassium 3.4 (*) 3.5 - 5.1 mEq/L   Chloride 106  96 - 112 mEq/L   CO2 28  19 - 32 mEq/L   Glucose, Bld 85  70 - 99 mg/dL   BUN 14  6 - 23 mg/dL   Creatinine, Ser 2.13  0.50 - 1.10 mg/dL   Calcium 8.6  8.4 - 08.6 mg/dL   GFR calc non Af Amer >90  >90 mL/min   GFR calc Af Amer >90  >90 mL/min   Comment:            The eGFR has been calculated     using the CKD EPI equation.     This calculation has not been     validated in all clinical     situations.     eGFR's persistently     <90 mL/min signify     possible Chronic Kidney Disease.  CBC     Status: Abnormal   Collection Time    03/28/12  2:25 AM      Result Value Range   WBC 8.8  4.0 - 10.5 K/uL   RBC 5.37 (*) 3.87 - 5.11 MIL/uL   Hemoglobin 15.9 (*) 12.0 - 15.0 g/dL   HCT 57.8  46.9 - 62.9 %   MCV 84.9  78.0 - 100.0 fL   MCH 29.6  26.0 - 34.0 pg   MCHC 34.9  30.0 - 36.0 g/dL   RDW 52.8  41.3 - 24.4 %   Platelets 264  150 - 400 K/uL  TROPONIN I     Status: None   Collection Time    03/28/12  2:26 AM      Result Value Range   Troponin I <0.30  <0.30 ng/mL   Comment:            Due to the release kinetics of cTnI,     a negative result within the first hours     of the onset of symptoms does not rule out     myocardial infarction with certainty.     If myocardial infarction is still suspected,     repeat the test at appropriate intervals.  TROPONIN I     Status: None   Collection Time    03/28/12  8:30 AM      Result Value Range   Troponin I <0.30  <0.30 ng/mL   Comment:            Due to the release kinetics of cTnI,     a negative result within the first hours     of the onset of symptoms does not rule out     myocardial infarction with certainty.     If myocardial infarction is still suspected,     repeat the test at appropriate intervals.     HPI : 62  y.o. female with history of fibromyalgia, chronic pain syndrome and asthma. Patient is on chronic fentanyl patch came in to the hospital because of left-sided chest pain. Patient said she was in her  usual state of health this morning when she woke up at 6, at 6:30 she developed left-sided chest pain, it was 7/10 in intensity, sharp, constant, she did not notice anything making the pain worse. The pain got better when she came in to the emergency department she had nitroglycerin and morphine. The pain lasted from about 6:30 AM till the evening when she had the morphine. Pain now comes on and off. Initial evaluation in the emergency department showed negative EKG and cardiac enzymes. Patient was  admitted to rule out ACS.   HOSPITAL COURSE:   Chest pain  -Atypical chest pain, left sided, sharp and relieved by morphine.  -Patient does not have recent heart evaluation, she had normal stress test in 2007.  -12-lead EKG and cardiac enzymes are negative. Cardiac enzymes were negative Lexi scan Myoview has been scheduled and was negative -dimer is negative   Hypothyroidism  -Patient is on Arm[place] Thyroid, TSH is 3.793   Fibromyalgia and chronic pain  -Patient is on fentanyl patch, continue.   GERD  -Patient is on Prilosec, substituted by Protonix.   Code Status: Full code       Discharge Exam:   Blood pressure 113/70, pulse 58, temperature 98 F (36.7 C), temperature source Oral, resp. rate 16, height 5' 4.5" (1.638 m), weight 97.115 kg (214 lb 1.6 oz), SpO2 97.00%.   General appearance: alert, cooperative and no distress  Head: Normocephalic, without obvious abnormality, atraumatic  Eyes: conjunctivae/corneas clear. PERRL, EOM's intact. Fundi benign.  Nose: Nares normal. Septum midline. Mucosa normal. No drainage or sinus tenderness.  Throat: lips, mucosa, and tongue normal; teeth and gums normal  Neck: Supple, no masses, no cervical lymphadenopathy, no JVD appreciated, no meningeal  signs  Resp: clear to auscultation bilaterally  Chest wall: no tenderness  Cardio: regular rate and rhythm, S1, S2 normal, no murmur, click, rub or gallop  GI: soft, non-tender; bowel sounds normal; no masses, no organomegaly  Extremities: extremities normal, atraumatic, no cyanosis or edema  Skin: Skin color, texture, turgor normal. No rashes or lesions  Neurologic: Alert and oriented X 3, normal strength and tone. Normal symmetric reflexes. Normal coordination and gait          Signed: ABROL,NAYANA 03/28/2012, 11:52 AM

## 2012-04-03 ENCOUNTER — Encounter: Payer: Self-pay | Admitting: Internal Medicine

## 2012-04-03 ENCOUNTER — Ambulatory Visit (INDEPENDENT_AMBULATORY_CARE_PROVIDER_SITE_OTHER): Payer: 59 | Admitting: Internal Medicine

## 2012-04-03 VITALS — BP 118/72 | HR 69 | Temp 98.7°F | Ht 64.5 in | Wt 213.2 lb

## 2012-04-03 DIAGNOSIS — R7309 Other abnormal glucose: Secondary | ICD-10-CM

## 2012-04-03 DIAGNOSIS — I10 Essential (primary) hypertension: Secondary | ICD-10-CM

## 2012-04-03 DIAGNOSIS — R5383 Other fatigue: Secondary | ICD-10-CM

## 2012-04-03 DIAGNOSIS — G4733 Obstructive sleep apnea (adult) (pediatric): Secondary | ICD-10-CM

## 2012-04-03 DIAGNOSIS — R7302 Impaired glucose tolerance (oral): Secondary | ICD-10-CM

## 2012-04-03 NOTE — Assessment & Plan Note (Signed)
stable overall by history and exam, recent data reviewed with pt, and pt to continue medical treatment as before,  to f/u any worsening symptoms or concerns BP Readings from Last 3 Encounters:  04/03/12 118/72  03/28/12 126/65  02/06/12 134/84

## 2012-04-03 NOTE — Patient Instructions (Addendum)
Please continue all other medications as before You will be contacted regarding the referral for: pulmonary Please continue your efforts at being more active, low cholesterol diet, and weight control, as you do Please remember to sign up for My Chart if you have not done so, as this will be important to you in the future with finding out test results, communicating by private email, and scheduling acute appointments online when needed. Please return in 6 months, or sooner if needed

## 2012-04-03 NOTE — Assessment & Plan Note (Signed)
stable overall by history and exam, recent data reviewed with pt, and pt to continue medical treatment as before,  to f/u any worsening symptoms or concerns Lab Results  Component Value Date   HGBA1C 5.3 03/27/2012

## 2012-04-03 NOTE — Assessment & Plan Note (Signed)
Needs to re-start tx, for pulm referral

## 2012-04-03 NOTE — Progress Notes (Signed)
Subjective:    Patient ID: [name], female    DOB: 1950-03-06, 62 y.o.   MRN: [id number]  HPI  Here after hospn mar 6 with Chest pain assoc with sweat, left sided, severe, better with mso4 in the ER, also had taken 2 asa and ntg; ruled out for ACS. Last stress test 2007, stress test mar 6 neg for ischemia.  Pain atypical and allowed to f/u here.  Also with BP up and down per pt, and HR at 49 noted overnight on monitoring, has not been using her CPAP recently so advised to re-start, pt concerned her machine is too old and corroded.  Did ok post hospn but c/o severe lack of energy, not using the CPAP since d/c.  Last machine near 62 yo.  Not seen recently for OSA. Had incidentally flu like symptoms about 3 wks ago, and a transient rash about the neck and left ankle, resolved.  Pt continues to have recurring right LBP without change in severity, bowel or bladder change, fever, wt loss,  worsening LE pain/numbness/weakness, gait change or falls.  Has seen [facility] physician recently (sees once per mo), tx with B12 shot but fatigue not better.  Just feels exhausted. No further CP Past Medical History  Diagnosis Date  . Endometriosis   . Osteopenia   . Asthma   . Fibromyalgia   . Thyroid disease     Hypothyroid  . Elevated cholesterol   . Migraines   . Bowel obstruction   . GERD (gastroesophageal reflux disease)   . Impaired glucose tolerance 02/25/2011  . Complication of anesthesia     "I have the shakes"  . Heart murmur     mvp  . Shortness of breath     with exertion  . Arthritis   . Low back pain   . Hx of oral aphthous ulcers   . Biliary dyskinesia   . Diverticulosis   . Nocturia   . Hypothyroidism     hx nodule on thyroid   . Sleep apnea     has not used c-pap x 1 yr.   . Depression   . Cervical dysplasia    Past Surgical History  Procedure Laterality Date  . Tonsillectomy    . Pelvic laparoscopy  1995,2002    DL LSO DL RSO  . Vaginal hysterectomy  1983  . Oophorectomy       LSO-BSO  . Breast surgery      Benign breast cyst  . Cholecystectomy  12/05/2011    Procedure: LAPAROSCOPIC CHOLECYSTECTOMY;  Surgeon: Axel Filler, MD;  Location: [facility];  Service: General;  Laterality: N/A;  . Colposcopy    . Gynecologic cryosurgery      reports that she has never smoked. She has never used smokeless tobacco. She reports that  drinks alcohol. She reports that she does not use illicit drugs. family history includes Breast cancer in her maternal aunts, maternal grandmother, mother, and paternal grandmother; Cancer in her mother and sister; Colon cancer in her sister; Diabetes in her father; Heart disease in her father and mother; Hypertension in her father and mother; Ovarian cancer in her paternal grandmother; and Sarcoidosis in her brother and sister. Allergies  Allergen Reactions  . Celecoxib   . Codeine     REACTION: Nausea  . Duloxetine     REACTION: n/v  . Erythromycin     REACTION: Itching  . Escitalopram Oxalate Nausea Only  . Ezetimibe   . Iohexol  Desc: Elwin Sleight, EMYCIN   . Milnacipran     REACTION: n/v  . Pregabalin     REACTION: wt gain  . Rofecoxib   . Statins    Current Outpatient Prescriptions on File Prior to Visit  Medication Sig Dispense Refill  . albuterol (PROVENTIL HFA;VENTOLIN HFA) 108 (90 BASE) MCG/ACT inhaler Inhale 2 puffs into the lungs every 6 (six) hours as needed. For wheezing      . albuterol (PROVENTIL) (2.5 MG/3ML) 0.083% nebulizer solution Take 2.5 mg by nebulization every 6 (six) hours as needed. For wheezing      . aspirin EC 81 MG EC tablet Take 1 tablet (81 mg total) by mouth daily.  30 tablet  0  . Calcium Carbonate-Vitamin D (CALCIUM + D PO) Take 1 tablet by mouth daily.      . cyclobenzaprine (FLEXERIL) 10 MG tablet Take 1 tablet (10 mg total) by mouth 3 (three) times daily as needed for muscle spasms.  90 tablet  1  . Estradiol Acetate (FEMRING) 0.1 MG/24HR RING Place 0.1 each vaginally every 3 (three)  months.  0.9 each  3  . fentaNYL (DURAGESIC - DOSED MCG/HR) 25 MCG/HR Place 1 patch onto the skin every 3 (three) days.      Marland Kitchen FIBER SELECT GUMMIES PO Take 2 tablets by mouth 2 (two) times daily.      . fluticasone (FLONASE) 50 MCG/ACT nasal spray Place 2 sprays into the nose daily.  16 g  2  . hydrocortisone butyrate (LUCOID) 0.1 % CREA cream Apply topically 2 (two) times daily.      . lansoprazole (PREVACID) 30 MG capsule Take 30 mg by mouth daily.      . Multiple Vitamins-Minerals (MULTIVITAMIN WITH MINERALS) tablet Take 1 tablet by mouth daily.      . Pitavastatin Calcium (LIVALO) 4 MG TABS Take 1 tablet by mouth at bedtime.       . polyethylene glycol powder (MIRALAX) powder Take 17 g by mouth as needed. For constipation      . testosterone (ANDROGEL) 50 MG/5GM GEL Place 5 g onto the skin daily. Cream vag @@ Hs      . tiZANidine (ZANAFLEX) 4 MG tablet Take 4 mg by mouth every 6 (six) hours as needed. For headaches      . topiramate (TOPAMAX) 100 MG tablet Take 100 mg by mouth 2 (two) times daily.      Marland Kitchen dicyclomine (BENTYL) 10 MG capsule Take 10 mg by mouth 4 (four) times daily -  before meals and at bedtime. As needed      . thyroid (ARMOUR THYROID) 30 MG tablet Take 1 tablet (30 mg total) by mouth daily.  90 tablet  3   No current facility-administered medications on file prior to visit.   Review of Systems  Constitutional: Negative for unexpected weight change, or unusual diaphoresis  HENT: Negative for tinnitus.   Eyes: Negative for photophobia and visual disturbance.  Respiratory: Negative for choking and stridor.   Gastrointestinal: Negative for vomiting and blood in stool.  Genitourinary: Negative for hematuria and decreased urine volume.  Musculoskeletal: Negative for acute joint swelling Skin: Negative for color change and wound.  Neurological: Negative for tremors and numbness other than noted  Psychiatric/Behavioral: Negative for decreased concentration or  hyperactivity.        Objective:   Physical Exam BP 118/72  Pulse 69  Temp(Src) 98.7 F (37.1 C) (Oral)  Ht 5' 4.5" (1.638 m)  Wt 213 lb  4 oz (96.73 kg)  BMI 36.05 kg/m2  SpO2 96% VS noted, not acutely ill appearing, does not appear overly fatigued and able to ascend exam table Constitutional: Pt appears well-developed and well-nourished.  HENT: Head: NCAT.  Right Ear: External ear normal.  Left Ear: External ear normal.  Eyes: Conjunctivae and EOM are normal. Pupils are equal, round, and reactive to light.  Neck: Normal range of motion. Neck supple.  Cardiovascular: Normal rate and regular rhythm.   Pulmonary/Chest: Effort normal and breath sounds normal.  Abd:  Soft, NT, non-distended, + BS, no flank tender Neurological: Pt is alert. Not confused , motor/dtr/gait intact Skin: Skin is warm. No erythema.  Psychiatric: Pt behavior is normal. Thought content normal. 1-2+ nervous    Assessment & Plan:

## 2012-04-03 NOTE — Assessment & Plan Note (Addendum)
Exam o/w benign, recent labs from mar hospn reviewed with pt, I feel no further labs needed,  to f/u any worsening symptoms or concerns  Note:  Total time for pt hx, exam, review of record with pt in the room, determination of diagnoses and plan for further eval and tx is > 40 min, with over 50% spent in coordination and counseling of patient

## 2012-04-04 ENCOUNTER — Telehealth: Payer: Self-pay

## 2012-04-04 NOTE — Telephone Encounter (Signed)
Unable to contact the patient to respond to question as no phone number listed in her chart.

## 2012-04-04 NOTE — Telephone Encounter (Signed)
Message copied by Pincus Sanes on Thu Apr 04, 2012  8:59 AM ------      Message from: Corwin Levins      Created: Wed Apr 03, 2012  5:00 PM       Last GFR was > 90, which is normal.  There is no concern for CKD      ----- Message -----         From: Vladimir Crofts Ewing         Sent: 04/03/2012   4:53 PM           To: Corwin Levins, MD            The patient saw on her lab results that she may have chronic kidney disease please advise       ------

## 2012-04-25 ENCOUNTER — Encounter: Payer: Self-pay | Admitting: Pulmonary Disease

## 2012-04-25 ENCOUNTER — Ambulatory Visit (INDEPENDENT_AMBULATORY_CARE_PROVIDER_SITE_OTHER): Payer: Medicare Other | Admitting: Pulmonary Disease

## 2012-04-25 VITALS — BP 130/88 | HR 68 | Temp 98.2°F | Ht 64.0 in | Wt 215.0 lb

## 2012-04-25 DIAGNOSIS — G4733 Obstructive sleep apnea (adult) (pediatric): Secondary | ICD-10-CM | POA: Insufficient documentation

## 2012-04-25 NOTE — Progress Notes (Signed)
Subjective:    Patient ID: [name], female    DOB: 1950-12-25, 62 y.o.   MRN: [id number]  HPI The patient is a 62 year old female who been asked to see for management of obstructive sleep apnea.  She was diagnosed with moderate OSA in 2006, with an AHI of 23 events per hour.  She was started on CPAP with a fairly good response, but unfortunately did not keep up with her machine care and supplies.  She quit using the device 2 years ago because of recurrent sinopulmonary infections, and she felt her machine was responsible.  Currently, the patient states that her machine is full of mold and corrosion.  The patient is unsure if she is snoring currently, but she is having frequent awakenings and is not rested in the mornings upon arising.  She will fall asleep with any period of inactivity during the day, and experiences extreme fatigue.  She also gets sleepy with driving.  The patient's weight is up 25 pounds of last 2 years, and her Epworth score today is 9.  Sleep Questionnaire What time do you typically go to bed?( Between what hours) 10-11p 10-11p at 1129 on 04/25/12 by Nita Sells, CMA How long does it take you to fall asleep? 1 hr 1 hr at 1129 on 04/25/12 by Marjo Bicker Mabe, CMA How many times during the night do you wake up? 5 5 at 1129 on 04/25/12 by Nita Sells, CMA What time do you get out of bed to start your day? No Value between 11am-2pm at 1129 on 04/25/12 by Nita Sells, CMA Do you drive or operate heavy machinery in your occupation? No No at 1129 on 04/25/12 by Nita Sells, CMA How much has your weight changed (up or down) over the past two years? (In pounds) 25 lb (11.34 kg)25 lb (11.34 kg) increased at 1129 on 04/25/12 by Nita Sells, CMA Have you ever had a sleep study before? Yes Yes at 1129 on 04/25/12 by Nita Sells, CMA If yes, location of study? [facility] at 1129 on 04/25/12 by Nita Sells, CMA If yes, date of study? Do you  currently use CPAP? NoNo NOT USED IN 2 YEARS D/T BEING OLD AND DIRTY at 1129 on 04/25/12 by Nita Sells, CMA Do you wear oxygen at any time? No No at 1129 on 04/25/12 by Nita Sells, CMA    Review of Systems  Constitutional: Positive for fatigue ( hard to function---difficult to get our of bed in the monrings and complete her normal daily activities). Negative for fever and unexpected weight change.  HENT: Negative for ear pain, nosebleeds, congestion, sore throat, rhinorrhea, sneezing, trouble swallowing, dental problem, postnasal drip and sinus pressure.   Eyes: Negative for redness and itching.  Respiratory: Negative for cough, chest tightness, shortness of breath and wheezing.   Cardiovascular: Negative for palpitations and leg swelling.  Gastrointestinal: Negative for nausea and vomiting.  Genitourinary: Negative for dysuria.  Musculoskeletal: Negative for joint swelling.  Skin: Negative for rash.  Neurological: Negative for headaches.  Hematological: Does not bruise/bleed easily.  Psychiatric/Behavioral: Negative for dysphoric mood. The patient is not nervous/anxious.        Objective:   Physical Exam Constitutional:  Obese female, no acute distress  HENT:  Nares patent without discharge  Oropharynx without exudate, palate and uvula are normal  Eyes:  Perrla, eomi, no scleral icterus  Neck:  No JVD, no TMG  Cardiovascular:  Normal rate, regular rhythm, no rubs or gallops.  No murmurs        Intact distal pulses  Pulmonary :  Normal breath sounds, no stridor or respiratory distress   No rales, rhonchi, or wheezing  Abdominal:  Soft, nondistended, bowel sounds present.  No tenderness noted.   Musculoskeletal:1+ lower extremity edema noted.  Lymph Nodes:  No cervical lymphadenopathy noted  Skin:  No cyanosis noted  Neurologic:  Appears sleepy but appropriate, moves all 4 extremities without obvious deficit.         Assessment & Plan:

## 2012-04-25 NOTE — Patient Instructions (Addendum)
Will get you restarted on cpap with a new machine and mask.  Will also optimize your pressure, and let you know the results. Work on weight loss followup with me in 6mos, but call if having tolerance issues.

## 2012-04-25 NOTE — Assessment & Plan Note (Signed)
The patient has a history of moderate obstructive sleep apnea in the past, and responded fairly well to CPAP.  She did not care for her machine her keep up with supplies, and she has a machine that sounds like it is contaminated.  She quit using it 2 years ago because of recurrent sinopulmonary infections.  She has now gained weight and has increased symptoms, and we'll need to get back on her device.  I have reviewed the pathophysiology of sleep apnea with her again, and we'll arrange for her to get a new CPAP machine.  We'll also need to optimize her pressure again on the automatic setting.  Finally, I have encouraged her to work aggressively on weight loss.

## 2012-05-15 ENCOUNTER — Encounter (HOSPITAL_COMMUNITY): Payer: Self-pay | Admitting: Emergency Medicine

## 2012-05-15 ENCOUNTER — Emergency Department (HOSPITAL_COMMUNITY): Payer: Medicare Other

## 2012-05-15 ENCOUNTER — Emergency Department (HOSPITAL_COMMUNITY)
Admission: EM | Admit: 2012-05-15 | Discharge: 2012-05-15 | Disposition: A | Discharge status: Home / Self Care | Payer: Medicare Other | Attending: Emergency Medicine | Admitting: Emergency Medicine

## 2012-05-15 DIAGNOSIS — F3289 Other specified depressive episodes: Secondary | ICD-10-CM | POA: Insufficient documentation

## 2012-05-15 DIAGNOSIS — J45909 Unspecified asthma, uncomplicated: Secondary | ICD-10-CM | POA: Insufficient documentation

## 2012-05-15 DIAGNOSIS — Z7982 Long term (current) use of aspirin: Secondary | ICD-10-CM | POA: Insufficient documentation

## 2012-05-15 DIAGNOSIS — IMO0002 Reserved for concepts with insufficient information to code with codable children: Secondary | ICD-10-CM | POA: Insufficient documentation

## 2012-05-15 DIAGNOSIS — Z8669 Personal history of other diseases of the nervous system and sense organs: Secondary | ICD-10-CM | POA: Insufficient documentation

## 2012-05-15 DIAGNOSIS — E7800 Pure hypercholesterolemia, unspecified: Secondary | ICD-10-CM | POA: Insufficient documentation

## 2012-05-15 DIAGNOSIS — K219 Gastro-esophageal reflux disease without esophagitis: Secondary | ICD-10-CM | POA: Insufficient documentation

## 2012-05-15 DIAGNOSIS — M129 Arthropathy, unspecified: Secondary | ICD-10-CM | POA: Insufficient documentation

## 2012-05-15 DIAGNOSIS — Z8719 Personal history of other diseases of the digestive system: Secondary | ICD-10-CM | POA: Insufficient documentation

## 2012-05-15 DIAGNOSIS — M899 Disorder of bone, unspecified: Secondary | ICD-10-CM | POA: Insufficient documentation

## 2012-05-15 DIAGNOSIS — Z8742 Personal history of other diseases of the female genital tract: Secondary | ICD-10-CM | POA: Insufficient documentation

## 2012-05-15 DIAGNOSIS — R011 Cardiac murmur, unspecified: Secondary | ICD-10-CM | POA: Insufficient documentation

## 2012-05-15 DIAGNOSIS — E039 Hypothyroidism, unspecified: Secondary | ICD-10-CM | POA: Insufficient documentation

## 2012-05-15 DIAGNOSIS — Z79899 Other long term (current) drug therapy: Secondary | ICD-10-CM | POA: Insufficient documentation

## 2012-05-15 DIAGNOSIS — F329 Major depressive disorder, single episode, unspecified: Secondary | ICD-10-CM | POA: Insufficient documentation

## 2012-05-15 DIAGNOSIS — R079 Chest pain, unspecified: Secondary | ICD-10-CM

## 2012-05-15 DIAGNOSIS — Z8709 Personal history of other diseases of the respiratory system: Secondary | ICD-10-CM | POA: Insufficient documentation

## 2012-05-15 DIAGNOSIS — R0789 Other chest pain: Secondary | ICD-10-CM | POA: Insufficient documentation

## 2012-05-15 DIAGNOSIS — Z87448 Personal history of other diseases of urinary system: Secondary | ICD-10-CM | POA: Insufficient documentation

## 2012-05-15 DIAGNOSIS — G43909 Migraine, unspecified, not intractable, without status migrainosus: Secondary | ICD-10-CM | POA: Insufficient documentation

## 2012-05-15 LAB — BASIC METABOLIC PANEL
BUN: 13 mg/dL (ref 6–23)
GFR calc Af Amer: 83 mL/min — ABNORMAL LOW (ref >90)
GFR calc non Af Amer: 72 mL/min — ABNORMAL LOW (ref >90)
Potassium: 4.6 mEq/L (ref 3.5–5.1)

## 2012-05-15 LAB — CBC WITH DIFFERENTIAL/PLATELET
Basophils Relative: 0 % (ref 0–1)
Eosinophils Absolute: 0.1 10*3/uL (ref 0.0–0.7)
Hemoglobin: 16.2 g/dL — ABNORMAL HIGH (ref 12.0–15.0)
MCH: 29.5 pg (ref 26.0–34.0)
MCHC: 34.2 g/dL (ref 30.0–36.0)
Monocytes Relative: 7 % (ref 3–12)
Neutrophils Relative %: 71 % (ref 43–77)
Platelets: 310 10*3/uL (ref 150–400)

## 2012-05-15 MED ORDER — TRAMADOL HCL 50 MG PO TABS: 50.0000 mg | ORAL_TABLET | Freq: Four times a day (QID) | ORAL | Status: DC | PRN

## 2012-05-15 NOTE — ED Provider Notes (Signed)
History     CSN: [id number]  Arrival date & time 05/15/12  1009   First MD Initiated Contact with Patient 05/15/12 1024      Chief Complaint  Patient presents with  . Chest Pain    (Consider location/radiation/quality/duration/timing/severity/associated sxs/prior treatment) HPI Comments: Patient reports that she began having pain of her left anterior chest approximately four hours prior to arrival in the ED.  Pain has been constant since that time.  Pain does not radiate.  She describes the pain as sharp and reports that it is worse when taking a deep breath.  She took four 81 mg Aspirin prior to arrival, but does not feel that the Aspirin helped the pain.  She denies SOB, nausea, vomiting, diaphoresis, numbness, tingling, dizziness, lightheadedness, or syncope.  Review of the chart shows that the patient had a Nuclear Stress Test performed in March of 2014, which was negative.  She denies any recent surgeries in the past 4 weeks.  Denies cough or hemoptysis.  Denies prior history of DVT or PE.  Denies lower extremity edema or erythema.  She does have a Femring in place.  The history is provided by the patient.    Past Medical History  Diagnosis Date  . Endometriosis   . Osteopenia   . Asthma   . Fibromyalgia   . Thyroid disease     Hypothyroid  . Elevated cholesterol   . Migraines   . Bowel obstruction   . GERD (gastroesophageal reflux disease)   . Impaired glucose tolerance 02/25/2011  . Complication of anesthesia     "I have the shakes"  . Heart murmur     mvp  . Shortness of breath     with exertion  . Arthritis   . Low back pain   . Hx of oral aphthous ulcers   . Biliary dyskinesia   . Diverticulosis   . Nocturia   . Hypothyroidism     hx nodule on thyroid   . Sleep apnea     has not used c-pap x 1 yr.   . Depression   . Cervical dysplasia     Past Surgical History  Procedure Laterality Date  . Tonsillectomy    . Pelvic laparoscopy  1995,2002    DL LSO DL  RSO  . Vaginal hysterectomy  1983  . Oophorectomy      LSO-BSO  . Breast surgery      Benign breast cyst  . Cholecystectomy  12/05/2011    Procedure: LAPAROSCOPIC CHOLECYSTECTOMY;  Surgeon: Axel Filler, MD;  Location: [facility];  Service: General;  Laterality: N/A;  . Colposcopy    . Gynecologic cryosurgery      Family History  Problem Relation Age of Onset  . Hypertension Mother   . Heart disease Mother   . Breast cancer Mother     Age 77  . Cancer Mother     Bonecancer  . Hypertension Father   . Heart disease Father   . Diabetes Father   . Colon cancer Sister   . Sarcoidosis Sister   . Cancer Sister     colon  . Breast cancer Maternal Grandmother     Age 71  . Breast cancer Paternal Grandmother     Age unknown  . Ovarian cancer Paternal Grandmother   . Sarcoidosis Brother   . Breast cancer Maternal Aunt     Age 68's  . Breast cancer Maternal Aunt     Age 6    History  Substance Use Topics  . Smoking status: Never Smoker   . Smokeless tobacco: Never Used  . Alcohol Use: 0.0 oz/week     Comment: socially    OB History   Grav Para Term Preterm Abortions TAB SAB Ect Mult Living   3 2 2  1     2       Review of Systems  Constitutional: Negative for fever and chills.  Respiratory: Negative for cough and shortness of breath.   Cardiovascular: Positive for chest pain. Negative for palpitations and leg swelling.  Gastrointestinal: Negative for nausea, vomiting and abdominal pain.  Skin: Negative for color change and rash.  Neurological: Negative for dizziness, syncope, light-headedness and numbness.  All other systems reviewed and are negative.    Allergies  Celecoxib; Codeine; Duloxetine; Erythromycin; Escitalopram oxalate; Ezetimibe; Iohexol; Milnacipran; Pregabalin; Rofecoxib; and Statins  Home Medications   Current Outpatient Rx  Name  Route  Sig  Dispense  Refill  . albuterol (PROVENTIL HFA;VENTOLIN HFA) 108 (90 BASE) MCG/ACT inhaler    Inhalation   Inhale 2 puffs into the lungs every 6 (six) hours as needed. For wheezing         . albuterol (PROVENTIL) (2.5 MG/3ML) 0.083% nebulizer solution   Nebulization   Take 2.5 mg by nebulization every 6 (six) hours as needed. For wheezing         . aspirin EC 81 MG EC tablet   Oral   Take 1 tablet (81 mg total) by mouth daily.   30 tablet   0   . Calcium Carbonate-Vitamin D (CALCIUM + D PO)   Oral   Take 1 tablet by mouth daily.         . Estradiol Acetate (FEMRING) 0.1 MG/24HR RING   Vaginal   Place 0.1 each vaginally every 3 (three) months.   0.9 each   3   . fentaNYL (DURAGESIC - DOSED MCG/HR) 25 MCG/HR   Transdermal   Place 1 patch onto the skin every 3 (three) days.         Marland Kitchen FIBER SELECT GUMMIES PO   Oral   Take 2 tablets by mouth 2 (two) times daily.         . fluticasone (FLONASE) 50 MCG/ACT nasal spray   Nasal   Place 2 sprays into the nose daily.   16 g   2   . hydrocortisone butyrate (LUCOID) 0.1 % CREA cream   Topical   Apply topically 2 (two) times daily.         Marland Kitchen lactulose (CEPHULAC) 10 G packet   Oral   Take 10 g by mouth 3 (three) times daily.         . Multiple Vitamins-Minerals (MULTIVITAMIN WITH MINERALS) tablet   Oral   Take 1 tablet by mouth daily.         Marland Kitchen omeprazole (PRILOSEC) 20 MG capsule   Oral   Take 20 mg by mouth daily.         . Pitavastatin Calcium (LIVALO) 4 MG TABS   Oral   Take 1 tablet by mouth at bedtime.          . polyethylene glycol powder (MIRALAX) powder   Oral   Take 17 g by mouth as needed. For constipation         . testosterone (ANDROGEL) 50 MG/5GM GEL   Transdermal   Place 5 g onto the skin daily. Cream vag @@ Hs         .  tiZANidine (ZANAFLEX) 4 MG tablet   Oral   Take 4 mg by mouth every 6 (six) hours as needed. For headaches         . topiramate (TOPAMAX) 100 MG tablet   Oral   Take 100 mg by mouth 2 (two) times daily.         Marland Kitchen EXPIRED: dicyclomine (BENTYL)  10 MG capsule   Oral   Take 10 mg by mouth 4 (four) times daily -  before meals and at bedtime. As needed         . EXPIRED: thyroid (ARMOUR THYROID) 30 MG tablet   Oral   Take 1 tablet (30 mg total) by mouth daily.   90 tablet   3     BP 116/50  Pulse 62  Resp 21  SpO2 97%  Physical Exam  Nursing note and vitals reviewed. Constitutional: She appears well-developed and well-nourished.  HENT:  Head: Normocephalic and atraumatic.  Mouth/Throat: Oropharynx is clear and moist.  Neck: Normal range of motion. Neck supple.  Cardiovascular: Normal rate, regular rhythm, normal heart sounds and intact distal pulses.   Pulmonary/Chest: Effort normal and breath sounds normal. No respiratory distress. She has no wheezes. She has no rales. She exhibits tenderness.  Abdominal: Soft. Bowel sounds are normal. There is no tenderness.  Musculoskeletal:  No LE edema or erythema  Neurological: She is alert.  Skin: Skin is warm and dry. No rash noted.  Psychiatric: She has a normal mood and affect.    ED Course  Procedures (including critical care time)  Labs Reviewed  D-DIMER, QUANTITATIVE  CBC WITH DIFFERENTIAL  BASIC METABOLIC PANEL   Dg Chest 2 View  05/15/2012  *RADIOLOGY REPORT*  Clinical Data: Chest pain and shortness of breath.  CHEST - 2 VIEW  Comparison: PA and lateral chest 03/27/2012 and 10/31/2011.  Findings: Lung volumes are slightly lower than on the comparison studies with crowding of the bronchovascular structures.  Lungs are clear.  Heart size is upper normal.  No pneumothorax or pleural effusion.  IMPRESSION: No acute disease.   Original Report Authenticated By: Holley Dexter, M.D.      No diagnosis found.   Date: 05/15/2012  Rate: 65  Rhythm: normal sinus rhythm  QRS Axis: normal  Intervals: normal  ST/T Wave abnormalities: nonspecific T wave changes  Conduction Disutrbances:none  Narrative Interpretation: flattening of the t waves  Old EKG Reviewed:  unchanged  Patient discussed with Dr. Effie Shy.  MDM  Patient is to be discharged with recommendation to follow up with PCP in regards to today's hospital visit. Chest pain is not likely of cardiac or pulmonary etiology d/t presentation,  VSS, no tracheal deviation, no JVD or new murmur, RRR, breath sounds equal bilaterally, EKG without acute ischemic abnormalities, negative troponin, and negative CXR.  D-dimer also negative.  Patient also had a negative Nuclear Stress Test done last month.  Pt stable for discharge.  Return precautions given.           Pascal Lux [place], PA-C 05/15/12 [phone]

## 2012-05-15 NOTE — ED Notes (Signed)
Per EMS, patient was putting on bra and felt sudden left rib cage pain, under breast-pt took aspirin prior to EMS arriving-asymptomatic

## 2012-05-15 NOTE — ED Notes (Signed)
Pt escorted to discharge window. Pt verbalized understanding discharge instructions. In no acute distress.  

## 2012-05-15 NOTE — ED Notes (Signed)
[id number]<AV> Expected date:<BR> Expected time:<BR> Means of arrival:<BR> Comments:<BR> Rib pain after turning wrong while putting on a shirt

## 2012-05-15 NOTE — ED Notes (Signed)
Pt to xray

## 2012-05-15 NOTE — ED Notes (Signed)
Pt alert and oriented x4. Respirations even and unlabored, bilateral symmetrical rise and fall of chest. Skin warm and dry. In no acute distress. Denies needs.   

## 2012-05-16 ENCOUNTER — Telehealth: Payer: Self-pay | Admitting: Internal Medicine

## 2012-05-16 ENCOUNTER — Ambulatory Visit (INDEPENDENT_AMBULATORY_CARE_PROVIDER_SITE_OTHER): Payer: Medicare Other | Admitting: Internal Medicine

## 2012-05-16 ENCOUNTER — Encounter: Payer: Self-pay | Admitting: Internal Medicine

## 2012-05-16 VITALS — BP 108/78 | HR 58 | Temp 98.3°F | Ht 64.5 in | Wt 211.0 lb

## 2012-05-16 DIAGNOSIS — M549 Dorsalgia, unspecified: Secondary | ICD-10-CM

## 2012-05-16 DIAGNOSIS — R079 Chest pain, unspecified: Secondary | ICD-10-CM

## 2012-05-16 DIAGNOSIS — J309 Allergic rhinitis, unspecified: Secondary | ICD-10-CM

## 2012-05-16 MED ORDER — GABAPENTIN 100 MG PO CAPS: 100.0000 mg | ORAL_CAPSULE | Freq: Three times a day (TID) | ORAL | Status: DC

## 2012-05-16 MED ORDER — HYDROCODONE-ACETAMINOPHEN 5-325 MG PO TABS: 1.0000 | ORAL_TABLET | Freq: Four times a day (QID) | ORAL | Status: DC | PRN

## 2012-05-16 NOTE — Telephone Encounter (Signed)
Ok for [employer] this afternoon

## 2012-05-16 NOTE — Progress Notes (Signed)
Subjective:    Patient ID: [name], female    DOB: 29-Nov-1950, 62 y.o.   MRN: [id number]  HPI  Here with persistent pleuritic nonexertional left sided CP located she descrbes as underneath the left breast without breast pain, swelling, mass, d/c or skin change.  Has had extensive recent evaluation in ER, with neg acute ecg change or abnormal cxr, tropnonin normal, d-dimer normal, and routine labs otherwise unremarkable.  States has been ongoing for approx 2 wks, and remembers now seemed to first notice after "I fell out" during an emotional religious service when became weak and fell backwards striking the mid back/spine at the same level as the current anterior left pleuritic CP.  Also had a rather severe left earache for several days last wk but resolved.  Also c/o left eye clearish weepiness for over a wk, without pain, vision change, has appt to see optho in may.  Current pain meds not helping pain Past Medical History  Diagnosis Date  . Endometriosis   . Osteopenia   . Asthma   . Fibromyalgia   . Thyroid disease     Hypothyroid  . Elevated cholesterol   . Migraines   . Bowel obstruction   . GERD (gastroesophageal reflux disease)   . Impaired glucose tolerance 02/25/2011  . Complication of anesthesia     "I have the shakes"  . Heart murmur     mvp  . Shortness of breath     with exertion  . Arthritis   . Low back pain   . Hx of oral aphthous ulcers   . Biliary dyskinesia   . Diverticulosis   . Nocturia   . Hypothyroidism     hx nodule on thyroid   . Sleep apnea     has not used c-pap x 1 yr.   . Depression   . Cervical dysplasia    Past Surgical History  Procedure Laterality Date  . Tonsillectomy    . Pelvic laparoscopy  1995,2002    DL LSO DL RSO  . Vaginal hysterectomy  1983  . Oophorectomy      LSO-BSO  . Breast surgery      Benign breast cyst  . Cholecystectomy  12/05/2011    Procedure: LAPAROSCOPIC CHOLECYSTECTOMY;  Surgeon: Axel Filler, MD;   Location: [facility];  Service: General;  Laterality: N/A;  . Colposcopy    . Gynecologic cryosurgery      reports that she has never smoked. She has never used smokeless tobacco. She reports that  drinks alcohol. She reports that she does not use illicit drugs. family history includes Breast cancer in her maternal aunts, maternal grandmother, mother, and paternal grandmother; Cancer in her mother and sister; Colon cancer in her sister; Diabetes in her father; Heart disease in her father and mother; Hypertension in her father and mother; Ovarian cancer in her paternal grandmother; and Sarcoidosis in her brother and sister. Allergies  Allergen Reactions  . Celecoxib   . Codeine     REACTION: Nausea  . Duloxetine     REACTION: n/v  . Erythromycin     REACTION: Itching  . Escitalopram Oxalate Nausea Only  . Ezetimibe   . Iohexol      Desc: VIOX, CELEBREX, EMYCIN   . Milnacipran     REACTION: n/v  . Pregabalin     REACTION: wt gain  . Rofecoxib   . Statins   . Tramadol    Current Outpatient Prescriptions on File Prior to Visit  Medication Sig Dispense Refill  . albuterol (PROVENTIL HFA;VENTOLIN HFA) 108 (90 BASE) MCG/ACT inhaler Inhale 2 puffs into the lungs every 6 (six) hours as needed. For wheezing      . albuterol (PROVENTIL) (2.5 MG/3ML) 0.083% nebulizer solution Take 2.5 mg by nebulization every 6 (six) hours as needed. For wheezing      . aspirin EC 81 MG EC tablet Take 1 tablet (81 mg total) by mouth daily.  30 tablet  0  . Calcium Carbonate-Vitamin D (CALCIUM + D PO) Take 1 tablet by mouth daily.      . Estradiol Acetate (FEMRING) 0.1 MG/24HR RING Place 0.1 each vaginally every 3 (three) months.  0.9 each  3  . fentaNYL (DURAGESIC - DOSED MCG/HR) 25 MCG/HR Place 1 patch onto the skin every 3 (three) days.      Marland Kitchen FIBER SELECT GUMMIES PO Take 2 tablets by mouth 2 (two) times daily.      . fluticasone (FLONASE) 50 MCG/ACT nasal spray Place 2 sprays into the nose daily.  16 g  2  .  hydrocortisone butyrate (LUCOID) 0.1 % CREA cream Apply topically 2 (two) times daily.      Marland Kitchen lactulose (CEPHULAC) 10 G packet Take 10 g by mouth 3 (three) times daily.      . Multiple Vitamins-Minerals (MULTIVITAMIN WITH MINERALS) tablet Take 1 tablet by mouth daily.      Marland Kitchen omeprazole (PRILOSEC) 20 MG capsule Take 20 mg by mouth daily.      . Pitavastatin Calcium (LIVALO) 4 MG TABS Take 1 tablet by mouth at bedtime.       . polyethylene glycol powder (MIRALAX) powder Take 17 g by mouth as needed. For constipation      . testosterone (ANDROGEL) 50 MG/5GM GEL Place 5 g onto the skin daily. Cream vag @@ Hs      . tiZANidine (ZANAFLEX) 4 MG tablet Take 4 mg by mouth every 6 (six) hours as needed. For headaches      . topiramate (TOPAMAX) 100 MG tablet Take 100 mg by mouth 2 (two) times daily.      Marland Kitchen dicyclomine (BENTYL) 10 MG capsule Take 10 mg by mouth 4 (four) times daily -  before meals and at bedtime. As needed      . thyroid (ARMOUR THYROID) 30 MG tablet Take 1 tablet (30 mg total) by mouth daily.  90 tablet  3   No current facility-administered medications on file prior to visit.   Review of Systems  Constitutional: Negative for unexpected weight change, or unusual diaphoresis  HENT: Negative for tinnitus.   Eyes: Negative for photophobia and visual disturbance.  Respiratory: Negative for choking and stridor.   Gastrointestinal: Negative for vomiting and blood in stool.  Genitourinary: Negative for hematuria and decreased urine volume.  Musculoskeletal: Negative for acute joint swelling Skin: Negative for color change and wound.  Neurological: Negative for tremors and numbness other than noted  Psychiatric/Behavioral: Negative for decreased concentration or  hyperactivity.       Objective:   Physical Exam BP 108/78  Pulse 58  Temp(Src) 98.3 F (36.8 C) (Oral)  Ht 5' 4.5" (1.638 m)  Wt 211 lb (95.709 kg)  BMI 35.67 kg/m2  SpO2 97% VS noted,  Constitutional: Pt appears  well-developed and well-nourished.  HENT: Head: NCAT.  Right Ear: External ear normal.  Left Ear: External ear normal.  Eyes: Conjunctivae and EOM are normal. Pupils are equal, round, and reactive to light.  Neck: Normal  range of motion. Neck supple.  Cardiovascular: Normal rate and regular rhythm.   Pulmonary/Chest: Effort normal and breath sounds normal.  Abd:  Soft, NT, non-distended, + BS Spine: nontender, has some localized left paravertebral tender approx t4 without swelling, erythema rash, no bruising Neurological: Pt is alert. Not confused , motor/dtr/sens intact Skin: Skin is warm. No erythema. No rash Psychiatric: Pt behavior is normal. Thought content normal. 1+nervous    Assessment & Plan:

## 2012-05-16 NOTE — Patient Instructions (Signed)
Please take all new medication as prescribed - the hydrocodone for pain, as well as to try the gabapentin for possible nerve pain Ok to try to the OTC Zaditor to see if this helps the weeping from the eyes Please continue all other medications as before Please keep your appointments with your specialists as you have planned  - eye doctor in may 2014

## 2012-05-16 NOTE — Assessment & Plan Note (Signed)
With prob left eye symptoms, ok to try the zaditor otc prn prior to optho eval in a few wks

## 2012-05-16 NOTE — Assessment & Plan Note (Signed)
With mild left paravertebral tender after a fall, mild only, will hold on films or other imaging for now,  to f/u any worsening symptoms or concerns

## 2012-05-16 NOTE — Telephone Encounter (Signed)
Patient Information:  Caller Name: Doryce  Phone: [phone]  Patient: [name], [name]  Gender: Female  DOB: January 07, 1951  Age: 62 Years  PCP: Oliver Barre (Adults only)  Office Follow Up:  Does the office need to follow up with this patient?: Yes  Instructions For The Office: Can pt be seen sooner.  Thank you.  RN Note:  Caller declined ED.  Symptoms  Reason For Call & Symptoms: Left Chest pain under left breast.  Pt seen yesterday @ [facility] and had CXR and EKG.  Unable to find reason for chest pain.  Caller was advised to call Dr. Jonny Ruiz and see what she needs to do next.  Pain 9/10.   The pain is cutting when she takes a deep breath.  Pain is radiates to her left back.  No nausea.  Denies shortness of breath.  Pt fell 2 wks ago and on her back.   Reviewed Health History In EMR: Yes  Reviewed Medications In EMR: Yes  Reviewed Allergies In EMR: Yes  Reviewed Surgeries / Procedures: Yes  Date of Onset of Symptoms: 05/15/2012  Treatments Tried: Motrin  Treatments Tried Worked: No  Guideline(s) Used:  Chest Pain  Disposition Per Guideline:   Go to ED Now  Reason For Disposition Reached:   Severe chest pain  Advice Given: Difficulty breathing  You become worse.   Patient Refused Recommendation:  Patient requests an appt.   Pt declines ED, will see if Office can see pt sooner.

## 2012-05-16 NOTE — Telephone Encounter (Signed)
Pt was already scheduled at 4:00 today.

## 2012-05-16 NOTE — Assessment & Plan Note (Signed)
ER eval reviewed, exam benign today, suspect msk strain related to recent fall with muscle and/or neuritic involvement; for limited rx hydrocodone, and trial gabapentin asd,  to f/u any worsening symptoms or concerns

## 2012-05-17 NOTE — ED Provider Notes (Signed)
Medical screening examination/treatment/procedure(s) were performed by non-physician practitioner and as supervising physician I was immediately available for consultation/collaboration.  Jaylanie Boschee L Elainna Eshleman, MD 05/17/12 [id number] 

## 2012-05-23 ENCOUNTER — Other Ambulatory Visit: Payer: Self-pay | Admitting: Internal Medicine

## 2012-07-12 ENCOUNTER — Telehealth: Payer: Self-pay | Admitting: Pulmonary Disease

## 2012-07-12 DIAGNOSIS — G4733 Obstructive sleep apnea (adult) (pediatric): Secondary | ICD-10-CM

## 2012-07-12 NOTE — Telephone Encounter (Signed)
Pt states that she was given a CPAP 2-3 weeks ago and worse it the first 2-3 nights and has not been able to wear since d/t the pressure setting being too much. Pt states that she had spoken with [facility] and they told her she would need a new order from [facility].  I explained to the patient that [facility] not in office today and pt is okay with waiting until Monday 6/23 for a response/change.   Please advise of an order Dr Shelle Iron and I can place it for you. Thanks.

## 2012-07-15 NOTE — Telephone Encounter (Signed)
Ok to send an order changing her cpap pressure on auto to 5-15cm.  Let pt know we will limit upside pressure.

## 2012-07-16 NOTE — Telephone Encounter (Signed)
Spoke with pt and advised of Dr Shelle Iron recommendations and that order has been sent to [facility].

## 2012-08-27 ENCOUNTER — Ambulatory Visit: Payer: Medicare Other | Admitting: Internal Medicine

## 2012-08-27 ENCOUNTER — Encounter: Payer: Self-pay | Admitting: Internal Medicine

## 2012-08-27 ENCOUNTER — Ambulatory Visit (INDEPENDENT_AMBULATORY_CARE_PROVIDER_SITE_OTHER): Payer: Medicare Other | Admitting: Internal Medicine

## 2012-08-27 ENCOUNTER — Other Ambulatory Visit (INDEPENDENT_AMBULATORY_CARE_PROVIDER_SITE_OTHER): Payer: Medicare Other

## 2012-08-27 VITALS — BP 100/80 | HR 80 | Temp 98.2°F | Resp 16 | Wt 208.0 lb

## 2012-08-27 DIAGNOSIS — E559 Vitamin D deficiency, unspecified: Secondary | ICD-10-CM

## 2012-08-27 DIAGNOSIS — I10 Essential (primary) hypertension: Secondary | ICD-10-CM

## 2012-08-27 DIAGNOSIS — J45909 Unspecified asthma, uncomplicated: Secondary | ICD-10-CM

## 2012-08-27 DIAGNOSIS — K589 Irritable bowel syndrome without diarrhea: Secondary | ICD-10-CM

## 2012-08-27 DIAGNOSIS — R209 Unspecified disturbances of skin sensation: Secondary | ICD-10-CM

## 2012-08-27 DIAGNOSIS — J309 Allergic rhinitis, unspecified: Secondary | ICD-10-CM

## 2012-08-27 DIAGNOSIS — R7309 Other abnormal glucose: Secondary | ICD-10-CM

## 2012-08-27 DIAGNOSIS — IMO0001 Reserved for inherently not codable concepts without codable children: Secondary | ICD-10-CM

## 2012-08-27 DIAGNOSIS — E039 Hypothyroidism, unspecified: Secondary | ICD-10-CM

## 2012-08-27 DIAGNOSIS — F3289 Other specified depressive episodes: Secondary | ICD-10-CM

## 2012-08-27 DIAGNOSIS — R202 Paresthesia of skin: Secondary | ICD-10-CM

## 2012-08-27 DIAGNOSIS — R7302 Impaired glucose tolerance (oral): Secondary | ICD-10-CM

## 2012-08-27 DIAGNOSIS — R079 Chest pain, unspecified: Secondary | ICD-10-CM

## 2012-08-27 DIAGNOSIS — R5381 Other malaise: Secondary | ICD-10-CM

## 2012-08-27 DIAGNOSIS — R5383 Other fatigue: Secondary | ICD-10-CM

## 2012-08-27 DIAGNOSIS — F329 Major depressive disorder, single episode, unspecified: Secondary | ICD-10-CM

## 2012-08-27 NOTE — Assessment & Plan Note (Signed)
Continue with current prescription therapy as reflected on the Med list.  

## 2012-08-27 NOTE — Assessment & Plan Note (Addendum)
Continue with current prescription therapy as reflected on the Med list. Stop Livalo Labs ordered

## 2012-08-27 NOTE — Assessment & Plan Note (Signed)
Chronic, multifactorial, noncardiac CL neg 2014 Hold Livalo See Meds

## 2012-08-27 NOTE — Assessment & Plan Note (Signed)
TSH, FT4 Continue with current prescription therapy as reflected on the Med list.

## 2012-08-27 NOTE — Assessment & Plan Note (Signed)
Gluten free trial (no wheat products) x4-6 weeks. OK to use Gluten-free bread and pasta. Milk free trial (no milk, ice cream and yogurt) x4 weeks. OK to use almond or soy milk. 

## 2012-08-27 NOTE — Progress Notes (Signed)
Subjective:    Patient ID: [name], female    DOB: [birth year], 62 y.o.   MRN: [id number]  Chest Pain  This is a recurrent problem. The current episode started more than 1 month ago. The onset quality is undetermined. The problem occurs constantly. The problem has been gradually worsening. The pain is moderate. The quality of the pain is described as heavy. The pain does not radiate. Associated symptoms include abdominal pain, back pain, a cough, diaphoresis, malaise/fatigue, shortness of breath and weakness. Pertinent negatives include no fever, hemoptysis, irregular heartbeat, near-syncope or palpitations. The pain is aggravated by emotional upset, breathing, walking, movement, food and exertion. She has tried acetaminophen, analgesics, NSAIDs and rest for the symptoms. The treatment provided mild relief. Risk factors include post-menopausal, lack of exercise, obesity and stress.  Her past medical history is significant for sleep apnea.  Pertinent negatives for past medical history include no CAD, no COPD, no CHF and no DVT. Prior diagnostic workup includes persantine thallium (2014:neg).  Asthma She complains of chest tightness, cough and shortness of breath. There is no hemoptysis. This is a chronic problem. The cough is non-productive. Associated symptoms include chest pain, malaise/fatigue, myalgias, postnasal drip and rhinorrhea. Pertinent negatives include no ear pain, fever or sneezing. She reports minimal improvement on treatment. Her past medical history is significant for asthma.   C/o severe fatigue x 4 wks F/u OSA, FMS, dry eyes  Review of Systems  Constitutional: Positive for malaise/fatigue, diaphoresis, activity change and fatigue. Negative for fever, chills and unexpected weight change.  HENT: Positive for rhinorrhea, postnasal drip and sinus pressure. Negative for ear pain and sneezing.   Eyes: Positive for pain. Negative for photophobia, discharge and redness.   Respiratory: Positive for cough and shortness of breath. Negative for hemoptysis and stridor.   Cardiovascular: Positive for chest pain. Negative for palpitations, leg swelling and near-syncope.  Gastrointestinal: Positive for abdominal pain, diarrhea and abdominal distention.  Genitourinary: Negative for dysuria, hematuria, vaginal bleeding and menstrual problem.  Musculoskeletal: Positive for myalgias, back pain, arthralgias and gait problem.  Skin: Negative for rash and wound.  Allergic/Immunologic: Positive for environmental allergies and food allergies. Negative for immunocompromised state.  Neurological: Positive for weakness.  Psychiatric/Behavioral: Positive for sleep disturbance and dysphoric mood. Negative for suicidal ideas, hallucinations, confusion, self-injury and agitation. The patient is nervous/anxious.        Objective:   Physical Exam  Constitutional: She appears well-developed. No distress.  Obese NAD  HENT:  Head: Normocephalic.  Right Ear: External ear normal.  Left Ear: External ear normal.  Nose: Nose normal.  Mouth/Throat: Oropharynx is clear and moist.  Eyes: Conjunctivae are normal. Pupils are equal, round, and reactive to light. Right eye exhibits no discharge. Left eye exhibits no discharge.  Neck: Normal range of motion. Neck supple. No JVD present. No tracheal deviation present. No thyromegaly present.  Cardiovascular: Normal rate, regular rhythm and normal heart sounds.  Exam reveals no gallop and no friction rub.   No murmur heard. Pulmonary/Chest: No stridor. No respiratory distress. She has no wheezes. She has no rales. She exhibits tenderness.  Abdominal: Soft. Bowel sounds are normal. She exhibits no distension and no mass. There is no tenderness. There is no rebound and no guarding.  Musculoskeletal: She exhibits tenderness. She exhibits no edema.  Tender all over  Lymphadenopathy:    She has no cervical adenopathy.  Neurological: She displays  normal reflexes. No cranial nerve deficit. She exhibits normal muscle tone. Coordination normal.  Skin: No rash noted. No erythema.  Psychiatric: Her behavior is normal. Judgment and thought content normal.  sad    Labs, tests, records reviewed       Assessment & Plan:  A complex case

## 2012-08-27 NOTE — Assessment & Plan Note (Signed)
Worse Labs Stop Livalo

## 2012-08-27 NOTE — Assessment & Plan Note (Signed)
A1c

## 2012-08-27 NOTE — Assessment & Plan Note (Signed)
Worse Continue with current prescription therapy as reflected on the Med list. Gluten free trial (no wheat products) x4-6 weeks. OK to use Gluten-free bread and pasta. Milk free trial (no milk, ice cream and yogurt) x4 weeks. OK to use almond or soy milk.

## 2012-08-27 NOTE — Patient Instructions (Signed)
Stop Livalo  Gluten free trial (no wheat products) x4-6 weeks. OK to use Gluten-free bread and pasta. Milk free trial (no milk, ice cream and yogurt) x4 weeks. OK to use almond or soy milk.

## 2012-08-27 NOTE — Assessment & Plan Note (Signed)
Chronic States antidepressants made it worse

## 2012-08-28 LAB — CBC WITH DIFFERENTIAL/PLATELET
Basophils Absolute: 0 10*3/uL (ref 0.0–0.1)
Eosinophils Absolute: 0.1 10*3/uL (ref 0.0–0.7)
Lymphocytes Relative: 27.1 % (ref 12.0–46.0)
MCHC: 32.8 g/dL (ref 30.0–36.0)
Neutrophils Relative %: 66.3 % (ref 43.0–77.0)
Platelets: 265 10*3/uL (ref 150.0–400.0)
RBC: 5.6 Mil/uL — ABNORMAL HIGH (ref 3.87–5.11)
RDW: 13 % (ref 11.5–14.6)

## 2012-08-28 LAB — URINALYSIS
Bilirubin Urine: NEGATIVE
Hgb urine dipstick: NEGATIVE
Nitrite: NEGATIVE
Urine Glucose: NEGATIVE
Urobilinogen, UA: 0.2 (ref 0.0–1.0)

## 2012-08-28 LAB — BASIC METABOLIC PANEL
BUN: 11 mg/dL (ref 6–23)
CO2: 26 mEq/L (ref 19–32)
Calcium: 9.3 mg/dL (ref 8.4–10.5)
Creatinine, Ser: 1.1 mg/dL (ref 0.4–1.2)
Glucose, Bld: 94 mg/dL (ref 70–99)

## 2012-08-28 LAB — RHEUMATOID FACTOR: Rhuematoid fact SerPl-aCnc: <10 IU/mL (ref <=14)

## 2012-08-28 LAB — T4, FREE: Free T4: 0.74 ng/dL (ref 0.60–1.60)

## 2012-08-28 LAB — HEPATIC FUNCTION PANEL
Albumin: 3.8 g/dL (ref 3.5–5.2)
Total Bilirubin: 0.7 mg/dL (ref 0.3–1.2)

## 2012-08-28 LAB — VITAMIN B12: Vitamin B-12: 512 pg/mL (ref 211–911)

## 2012-08-28 LAB — IBC PANEL
Iron: 128 ug/dL (ref 42–145)
Saturation Ratios: 40.9 % (ref 20.0–50.0)

## 2012-08-28 LAB — HEMOGLOBIN A1C: Hgb A1c MFr Bld: 5.2 % (ref 4.6–6.5)

## 2012-08-28 LAB — CK: Total CK: 101 U/L (ref 7–177)

## 2012-08-28 LAB — SEDIMENTATION RATE: Sed Rate: 18 mm/hr (ref 0–22)

## 2012-09-18 ENCOUNTER — Telehealth: Payer: Self-pay | Admitting: *Deleted

## 2012-09-18 NOTE — Telephone Encounter (Signed)
Patient informed of MD instructions and will address concerns at appt. Tomorrow with Dr. Jonny Ruiz

## 2012-09-18 NOTE — Telephone Encounter (Signed)
Pt called requesting whether she needed labs dawn again this week because her last H/H was elevated.  Pt has appointment 8.28.14.  Unable to contact pt to remind of appointment.  Please advise

## 2012-09-18 NOTE — Telephone Encounter (Signed)
prob not necessary as it has been elevated and stable for quite some time;  We could consider doing it if she has been doing the CPAP on a regular basis, to see if it improved, o/w would not really be needed

## 2012-09-19 ENCOUNTER — Encounter: Payer: Self-pay | Admitting: Internal Medicine

## 2012-09-19 ENCOUNTER — Ambulatory Visit (INDEPENDENT_AMBULATORY_CARE_PROVIDER_SITE_OTHER): Payer: Medicare Other | Admitting: Internal Medicine

## 2012-09-19 VITALS — BP 102/72 | HR 80 | Temp 98.3°F | Ht 64.5 in | Wt 212.0 lb

## 2012-09-19 DIAGNOSIS — F329 Major depressive disorder, single episode, unspecified: Secondary | ICD-10-CM

## 2012-09-19 DIAGNOSIS — I10 Essential (primary) hypertension: Secondary | ICD-10-CM

## 2012-09-19 DIAGNOSIS — D751 Secondary polycythemia: Secondary | ICD-10-CM

## 2012-09-19 DIAGNOSIS — F3289 Other specified depressive episodes: Secondary | ICD-10-CM

## 2012-09-19 MED ORDER — DICYCLOMINE HCL 10 MG PO CAPS: 10.0000 mg | ORAL_CAPSULE | Freq: Three times a day (TID) | ORAL | Status: DC

## 2012-09-19 NOTE — Progress Notes (Signed)
Subjective:    Patient ID: [name], female    DOB: 1950/07/03, 62 y.o.   MRN: [id number]  HPI  Here to f/u c/o recurring pain to the LLQ better with bentyl, needs refill, has hx of diverticulosis.  Pt denies chest pain, increased sob or doe, wheezing, orthopnea, PND, increased LE swelling, palpitations, dizziness or syncope.  Pt denies new neurological symptoms such as new headache, or facial or extremity weakness or numbness. Denies worsening depressive symptoms, suicidal ideation, or panic;  Past Medical History  Diagnosis Date  . Endometriosis   . Osteopenia   . Asthma   . Fibromyalgia   . Thyroid disease     Hypothyroid  . Elevated cholesterol   . Migraines   . Bowel obstruction   . GERD (gastroesophageal reflux disease)   . Impaired glucose tolerance 02/25/2011  . Complication of anesthesia     "I have the shakes"  . Heart murmur     mvp  . Shortness of breath     with exertion  . Arthritis   . Low back pain   . Hx of oral aphthous ulcers   . Biliary dyskinesia   . Diverticulosis   . Nocturia   . Hypothyroidism     hx nodule on thyroid   . Sleep apnea     has not used c-pap x 1 yr.   . Depression   . Cervical dysplasia    Past Surgical History  Procedure Laterality Date  . Tonsillectomy    . Pelvic laparoscopy  1995,2002    DL LSO DL RSO  . Vaginal hysterectomy  1983  . Oophorectomy      LSO-BSO  . Breast surgery      Benign breast cyst  . Cholecystectomy  12/05/2011    Procedure: LAPAROSCOPIC CHOLECYSTECTOMY;  Surgeon: Axel Filler, MD;  Location: [facility];  Service: General;  Laterality: N/A;  . Colposcopy    . Gynecologic cryosurgery      reports that she has never smoked. She has never used smokeless tobacco. She reports that  drinks alcohol. She reports that she does not use illicit drugs. family history includes Breast cancer in her maternal aunt, maternal aunt, maternal grandmother, mother, and paternal grandmother; Cancer in her mother and  sister; Colon cancer in her sister; Diabetes in her father; Heart disease in her father and mother; Hypertension in her father and mother; Ovarian cancer in her paternal grandmother; Sarcoidosis in her brother and sister. Allergies  Allergen Reactions  . Celecoxib   . Codeine     REACTION: Nausea  . Duloxetine     REACTION: n/v  . Erythromycin     REACTION: Itching  . Escitalopram Oxalate Nausea Only  . Ezetimibe   . Iohexol      Desc: VIOX, CELEBREX, EMYCIN   . Milnacipran     REACTION: n/v  . Pregabalin     REACTION: wt gain  . Rofecoxib   . Statins   . Tramadol    Current Outpatient Prescriptions on File Prior to Visit  Medication Sig Dispense Refill  . albuterol (PROVENTIL HFA;VENTOLIN HFA) 108 (90 BASE) MCG/ACT inhaler Inhale 2 puffs into the lungs every 6 (six) hours as needed. For wheezing      . albuterol (PROVENTIL) (2.5 MG/3ML) 0.083% nebulizer solution Take 2.5 mg by nebulization every 6 (six) hours as needed. For wheezing      . aspirin EC 81 MG EC tablet Take 1 tablet (81 mg total) by mouth  daily.  30 tablet  0  . Calcium Carbonate-Vitamin D (CALCIUM + D PO) Take 1 tablet by mouth daily.      . Estradiol Acetate (FEMRING) 0.1 MG/24HR RING Place 0.1 each vaginally every 3 (three) months.  0.9 each  3  . fentaNYL (DURAGESIC - DOSED MCG/HR) 25 MCG/HR Place 1 patch onto the skin every 3 (three) days.      Marland Kitchen FIBER SELECT GUMMIES PO Take 2 tablets by mouth 2 (two) times daily.      Marland Kitchen HYDROcodone-acetaminophen (NORCO/VICODIN) 5-325 MG per tablet Take 1 tablet by mouth every 6 (six) hours as needed for pain.  60 tablet  0  . hydrocortisone butyrate (LUCOID) 0.1 % CREA cream Apply topically 2 (two) times daily.      . Multiple Vitamins-Minerals (MULTIVITAMIN WITH MINERALS) tablet Take 1 tablet by mouth daily.      Marland Kitchen omeprazole (PRILOSEC) 20 MG capsule Take 20 mg by mouth daily.      . Pitavastatin Calcium (LIVALO) 4 MG TABS Take 1 tablet by mouth at bedtime.       .  polyethylene glycol powder (MIRALAX) powder Take 17 g by mouth as needed. For constipation      . testosterone (ANDROGEL) 50 MG/5GM GEL Place 5 g onto the skin daily. Cream vag @@ Hs      . thyroid (ARMOUR) 30 MG tablet Lyanne Co, CMA  90 tablet  3  . tiZANidine (ZANAFLEX) 4 MG tablet Take 4 mg by mouth every 6 (six) hours as needed. For headaches      . fluticasone (FLONASE) 50 MCG/ACT nasal spray Place 2 sprays into the nose daily.  16 g  2   No current facility-administered medications on file prior to visit.   Review of Systems  Constitutional: Negative for unexpected weight change, or unusual diaphoresis  HENT: Negative for tinnitus.   Eyes: Negative for photophobia and visual disturbance.  Respiratory: Negative for choking and stridor.   Gastrointestinal: Negative for vomiting and blood in stool.  Genitourinary: Negative for hematuria and decreased urine volume.  Musculoskeletal: Negative for acute joint swelling Skin: Negative for color change and wound.  Neurological: Negative for tremors and numbness other than noted  Psychiatric/Behavioral: Negative for decreased concentration or  hyperactivity.       Objective:   Physical Exam BP 102/72  Pulse 80  Temp(Src) 98.3 F (36.8 C) (Oral)  Ht 5' 4.5" (1.638 m)  Wt 212 lb (96.163 kg)  BMI 35.84 kg/m2  SpO2 96% VS noted,  Constitutional: Pt appears well-developed and well-nourished.  HENT: Head: NCAT.  Right Ear: External ear normal.  Left Ear: External ear normal.  Eyes: Conjunctivae and EOM are normal. Pupils are equal, round, and reactive to light.  Neck: Normal range of motion. Neck supple.  Cardiovascular: Normal rate and regular rhythm.   Pulmonary/Chest: Effort normal and breath sounds normal.  Abd:  Soft, NT, non-distended, + BS Neurological: Pt is alert. Not confused  Skin: Skin is warm. No erythema.  Psychiatric: Pt behavior is normal with dysphoric affect    Assessment & Plan:

## 2012-09-19 NOTE — Patient Instructions (Addendum)
Please continue all other medications as before, and refills have been done if requested - the bentyl Please have the pharmacy call with any other refills you may need.  You will be contacted regarding the referral for: hematology

## 2012-09-20 ENCOUNTER — Telehealth: Payer: Self-pay | Admitting: Internal Medicine

## 2012-09-20 NOTE — Telephone Encounter (Signed)
C/D 09/20/12 for appt. 10/09/12 °

## 2012-09-20 NOTE — Telephone Encounter (Signed)
S/W PT IN RE TO NP APPT 09/17 @ 1:30 W/DR. CHISM REFERRING DR. Oliver Barre DX- POLYCYTHEMIA, SECONDARY WELCOME PACKET MAILED.

## 2012-09-22 ENCOUNTER — Emergency Department (HOSPITAL_COMMUNITY)
Admission: EM | Admit: 2012-09-22 | Discharge: 2012-09-23 | Disposition: A | Discharge status: Home / Self Care | Payer: No Typology Code available for payment source | Attending: Emergency Medicine | Admitting: Emergency Medicine

## 2012-09-22 ENCOUNTER — Encounter (HOSPITAL_COMMUNITY): Payer: Self-pay | Admitting: *Deleted

## 2012-09-22 DIAGNOSIS — Z7982 Long term (current) use of aspirin: Secondary | ICD-10-CM | POA: Insufficient documentation

## 2012-09-22 DIAGNOSIS — Y9389 Activity, other specified: Secondary | ICD-10-CM | POA: Insufficient documentation

## 2012-09-22 DIAGNOSIS — G43909 Migraine, unspecified, not intractable, without status migrainosus: Secondary | ICD-10-CM | POA: Insufficient documentation

## 2012-09-22 DIAGNOSIS — S0990XA Unspecified injury of head, initial encounter: Secondary | ICD-10-CM | POA: Insufficient documentation

## 2012-09-22 DIAGNOSIS — IMO0002 Reserved for concepts with insufficient information to code with codable children: Secondary | ICD-10-CM | POA: Insufficient documentation

## 2012-09-22 DIAGNOSIS — Z79899 Other long term (current) drug therapy: Secondary | ICD-10-CM | POA: Insufficient documentation

## 2012-09-22 DIAGNOSIS — S0993XA Unspecified injury of face, initial encounter: Secondary | ICD-10-CM | POA: Insufficient documentation

## 2012-09-22 DIAGNOSIS — S8990XA Unspecified injury of unspecified lower leg, initial encounter: Secondary | ICD-10-CM | POA: Insufficient documentation

## 2012-09-22 DIAGNOSIS — E039 Hypothyroidism, unspecified: Secondary | ICD-10-CM | POA: Insufficient documentation

## 2012-09-22 DIAGNOSIS — D751 Secondary polycythemia: Secondary | ICD-10-CM | POA: Insufficient documentation

## 2012-09-22 DIAGNOSIS — M129 Arthropathy, unspecified: Secondary | ICD-10-CM | POA: Insufficient documentation

## 2012-09-22 DIAGNOSIS — Z8659 Personal history of other mental and behavioral disorders: Secondary | ICD-10-CM | POA: Insufficient documentation

## 2012-09-22 DIAGNOSIS — J45901 Unspecified asthma with (acute) exacerbation: Secondary | ICD-10-CM | POA: Insufficient documentation

## 2012-09-22 DIAGNOSIS — Z87410 Personal history of cervical dysplasia: Secondary | ICD-10-CM | POA: Insufficient documentation

## 2012-09-22 DIAGNOSIS — S79919A Unspecified injury of unspecified hip, initial encounter: Secondary | ICD-10-CM | POA: Insufficient documentation

## 2012-09-22 DIAGNOSIS — Z8742 Personal history of other diseases of the female genital tract: Secondary | ICD-10-CM | POA: Insufficient documentation

## 2012-09-22 DIAGNOSIS — R011 Cardiac murmur, unspecified: Secondary | ICD-10-CM | POA: Insufficient documentation

## 2012-09-22 DIAGNOSIS — K219 Gastro-esophageal reflux disease without esophagitis: Secondary | ICD-10-CM | POA: Insufficient documentation

## 2012-09-22 DIAGNOSIS — Y92410 Unspecified street and highway as the place of occurrence of the external cause: Secondary | ICD-10-CM | POA: Insufficient documentation

## 2012-09-22 MED ORDER — KETOROLAC TROMETHAMINE 30 MG/ML IJ SOLN: 30.0000 mg | Freq: Once | INTRAMUSCULAR | Status: DC

## 2012-09-22 MED ORDER — KETOROLAC TROMETHAMINE 60 MG/2ML IM SOLN
60.0000 mg | Freq: Once | INTRAMUSCULAR | Status: AC
  Administered 2012-09-22: 60 mg via INTRAMUSCULAR
  Filled 2012-09-22: qty 2

## 2012-09-22 MED ORDER — NAPROXEN 500 MG PO TABS: 500.0000 mg | ORAL_TABLET | Freq: Two times a day (BID) | ORAL | Status: DC

## 2012-09-22 MED ORDER — METHOCARBAMOL 500 MG PO TABS: 500.0000 mg | ORAL_TABLET | Freq: Two times a day (BID) | ORAL | Status: DC
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
## 2012-09-22 NOTE — Assessment & Plan Note (Signed)
stable overall by history and exam, recent data reviewed with pt, and pt to continue medical treatment as before,  to f/u any worsening symptoms or concerns BP Readings from Last 3 Encounters:  09/19/12 102/72  08/27/12 100/80  05/16/12 108/78

## 2012-09-22 NOTE — Assessment & Plan Note (Signed)
Etiology unclear, for heme referral

## 2012-09-22 NOTE — Assessment & Plan Note (Signed)
stable overall by history and exam, recent data reviewed with pt, and pt to continue medical treatment as before,  to f/u any worsening symptoms or concerns Lab Results  Component Value Date   WBC 8.5 08/27/2012   HGB 16.4* 08/27/2012   HCT 49.9* 08/27/2012   PLT 265.0 08/27/2012   GLUCOSE 94 08/27/2012   CHOL 159 10/27/2011   TRIG 62.0 10/27/2011   HDL 53.80 10/27/2011   LDLDIRECT 194.2 05/31/2007   LDLCALC 93 10/27/2011   ALT 11 08/27/2012   AST 17 08/27/2012   NA 142 08/27/2012   K 3.5 08/27/2012   CL 107 08/27/2012   CREATININE 1.1 08/27/2012   BUN 11 08/27/2012   CO2 26 08/27/2012   TSH 1.43 08/27/2012   HGBA1C 5.2 08/27/2012

## 2012-09-24 NOTE — ED Provider Notes (Signed)
Medical screening examination/treatment/procedure(s) were performed by non-physician practitioner and as supervising physician I was immediately available for consultation/collaboration.  Flint Melter, MD 09/24/12 1227

## 2012-09-26 ENCOUNTER — Telehealth: Payer: Self-pay | Admitting: *Deleted

## 2012-09-26 NOTE — Telephone Encounter (Signed)
Pt called requesting whether she is able to fly with the diagnosis of Polycythemia.  Please advise

## 2012-09-26 NOTE — Telephone Encounter (Signed)
Yes, in her case would be ok

## 2012-09-26 NOTE — Telephone Encounter (Signed)
Patient informed ok to fly

## 2012-09-29 ENCOUNTER — Other Ambulatory Visit: Payer: Self-pay | Admitting: Internal Medicine

## 2012-10-09 ENCOUNTER — Ambulatory Visit (HOSPITAL_BASED_OUTPATIENT_CLINIC_OR_DEPARTMENT_OTHER): Payer: Medicare Other | Admitting: Internal Medicine

## 2012-10-09 ENCOUNTER — Encounter: Payer: Self-pay | Admitting: Internal Medicine

## 2012-10-09 ENCOUNTER — Ambulatory Visit (HOSPITAL_BASED_OUTPATIENT_CLINIC_OR_DEPARTMENT_OTHER): Payer: Medicare Other | Admitting: Lab

## 2012-10-09 ENCOUNTER — Ambulatory Visit (HOSPITAL_BASED_OUTPATIENT_CLINIC_OR_DEPARTMENT_OTHER): Payer: 59

## 2012-10-09 ENCOUNTER — Telehealth: Payer: Self-pay | Admitting: Internal Medicine

## 2012-10-09 VITALS — BP 129/73 | HR 80 | Temp 97.9°F | Resp 18 | Ht 63.5 in | Wt 212.8 lb

## 2012-10-09 DIAGNOSIS — D751 Secondary polycythemia: Secondary | ICD-10-CM

## 2012-10-09 LAB — CBC & DIFF AND RETIC
Basophils Absolute: 0 10*3/uL (ref 0.0–0.1)
Eosinophils Absolute: 0.1 10*3/uL (ref 0.0–0.5)
HCT: 45.6 % (ref 34.8–46.6)
HGB: 15.2 g/dL (ref 11.6–15.9)
LYMPH%: 26.7 % (ref 14.0–49.7)
MCV: 87.5 fL (ref 79.5–101.0)
MONO#: 0.4 10*3/uL (ref 0.1–0.9)
MONO%: 6.3 % (ref 0.0–14.0)
NEUT#: 4.4 10*3/uL (ref 1.5–6.5)
Platelets: 283 10*3/uL (ref 145–400)
Retic %: 1.67 % (ref 0.70–2.10)
WBC: 6.7 10*3/uL (ref 3.9–10.3)

## 2012-10-09 LAB — LACTATE DEHYDROGENASE (CC13): LDH: 138 U/L (ref 125–245)

## 2012-10-09 NOTE — Patient Instructions (Signed)
Polycythemia Vera  Polycythemia Vera is a condition in which the body makes too many red blood cells and there is no known cause. The red blood cells (erythrocytes) are the cells which carry the oxygen in your blood stream to the cells of your body. Because of the increased red blood cells, the blood becomes thicker and does not circulate as well. It would be similar to your car having oil which is too thick so it cannot start and circulate as well. When the blood is too thick it often causes headaches and dizziness. It may also cause blood clots. Even though the blood clots easier, these patients bleed easier. The bleeding is caused because the blood cells which help stop bleeding (platelets) do not function normally. It occurs in all age groups but is more common in the 50 to 70 year age range. TREATMENT  The treatment of polycythemia vera for many years has been blood removal (phlebotomy) which is similar to blood removal in a blood bank, however this blood is not used for donation. Hydroxyurea is used to supplement phlebotomy. Aspirin is commonly given to thin the blood as long as the patient does not have a problem with bleeding. Other drugs are used based on the progression of the disease. Document Released: 10/04/2000 Document Revised: 04/03/2011 Document Reviewed: 04/10/2008 [employer] Patient Information 2014 [employer].  

## 2012-10-09 NOTE — Progress Notes (Signed)
Checked in new patient wth no issues. Mail and phone only.

## 2012-10-09 NOTE — Progress Notes (Signed)
[facility] NEW PATIENT EVALUATION   Name: [name] Date: 10/09/2012 MRN: [id number] DOB: 08/29/1950  PCP: Oliver Barre, MD   REFERRING PHYSICIAN: Corwin Levins, MD  REASON FOR REFERRAL: Rule out PCV    HISTORY OF PRESENT ILLNESS:[name] is a 62 y.o. female who is being referred to our office with concerns of primary polycythemia vera.   She reports going to the hospital earlier this year for chest discomfort.  She is unsure of the results of her stress test. Because used daily aspirin until April of this year.   She used CPAP for her sleep apnea until she stopped using it because of its mal-fitting.  She denies bleeding history with complications during her pregnancies.  She is up-to-date on her mammograms.  She endorses the sickle cell trait.  She denies history of blood clots but endorses easy bruising.  She was given testosterone (Androgel) 50 mg/5 gm gel to place onto the skin daily about 2 years ago but stopped 1.5 months ago. She does endorse discoloration her feet and hands and pruritus post bathing.    PAST MEDICAL HISTORY:  has a past medical history of Endometriosis; Osteopenia; Asthma; Fibromyalgia; Thyroid disease; Elevated cholesterol; Migraines; Bowel obstruction; GERD (gastroesophageal reflux disease); Impaired glucose tolerance (02/25/2011); Complication of anesthesia; Heart murmur; Shortness of breath; Arthritis; Low back pain; oral aphthous ulcers; Biliary dyskinesia; Diverticulosis; Nocturia; Hypothyroidism; Sleep apnea; Depression; and Cervical dysplasia.     PAST SURGICAL HISTORY: Past Surgical History  Procedure Laterality Date  . Tonsillectomy    . Pelvic laparoscopy  1995,2002    DL LSO DL RSO  . Vaginal hysterectomy  1983  . Oophorectomy      LSO-BSO  . Breast surgery      Benign breast cyst  . Cholecystectomy  12/05/2011    Procedure: LAPAROSCOPIC CHOLECYSTECTOMY;  Surgeon: Axel Filler, MD;  Location: [facility];  Service: General;   Laterality: N/A;  . Colposcopy    . Gynecologic cryosurgery       CURRENT MEDICATIONS: has a current medication list which includes the following prescription(s): albuterol, albuterol, alcaftadine, aspirin, calcium carbonate-vitamin d, cyclobenzaprine, cyclosporine, dicyclomine, estradiol acetate, fentanyl, fiber, fluticasone, hydrocortisone butyrate, methocarbamol, multivitamin with minerals, naproxen, omeprazole, polyethyl glycol-propyl glycol, polyethylene glycol powder, thyroid, tizanidine, and topiramate er.   ALLERGIES: Celecoxib; Codeine; Duloxetine; Erythromycin; Escitalopram oxalate; Ezetimibe; Iohexol; Milnacipran; Pregabalin; Rofecoxib; Statins; and Tramadol   SOCIAL HISTORY:  reports that she has never smoked. She has never used smokeless tobacco. She reports that  drinks alcohol. She reports that she does not use illicit drugs.   FAMILY HISTORY: family history includes Breast cancer in her maternal aunt, maternal aunt, maternal grandmother, mother, and paternal grandmother; Cancer in her mother and sister; Colon cancer in her sister; Diabetes in her father; Heart disease in her father and mother; Hypertension in her father and mother; Ovarian cancer in her paternal grandmother; Sarcoidosis in her brother and sister.   LABORATORY DATA:  CBC    Component Value Date/Time   WBC 6.7 10/09/2012 1539   WBC 8.5 08/27/2012 1731   RBC 5.21 10/09/2012 1539   RBC 5.60* 08/27/2012 1731   HGB 15.2 10/09/2012 1539   HGB 16.4* 08/27/2012 1731   HCT 45.6 10/09/2012 1539   HCT 49.9* 08/27/2012 1731   PLT 283 10/09/2012 1539   PLT 265.0 08/27/2012 1731   MCV 87.5 10/09/2012 1539   MCV 89.1 08/27/2012 1731   MCH 29.2 10/09/2012 1539   MCH 29.5 05/15/2012  1145   MCHC 33.3 10/09/2012 1539   MCHC 32.8 08/27/2012 1731   RDW 13.7 10/09/2012 1539   RDW 13.0 08/27/2012 1731   LYMPHSABS 1.8 10/09/2012 1539   LYMPHSABS 2.3 08/27/2012 1731   MONOABS 0.4 10/09/2012 1539   MONOABS 0.4 08/27/2012 1731   EOSABS 0.1 10/09/2012  1539   EOSABS 0.1 08/27/2012 1731   BASOSABS 0.0 10/09/2012 1539   BASOSABS 0.0 08/27/2012 1731   CMP     Component Value Date/Time   NA 142 08/27/2012 1731   K 3.5 08/27/2012 1731   CL 107 08/27/2012 1731   CO2 26 08/27/2012 1731   GLUCOSE 94 08/27/2012 1731   BUN 11 08/27/2012 1731   CREATININE 1.1 08/27/2012 1731   CALCIUM 9.3 08/27/2012 1731   PROT 7.5 08/27/2012 1731   ALBUMIN 3.8 08/27/2012 1731   AST 17 08/27/2012 1731   ALT 11 08/27/2012 1731   ALKPHOS 55 08/27/2012 1731   BILITOT 0.7 08/27/2012 1731   GFRNONAA 72* 05/15/2012 1145   GFRAA 83* 05/15/2012 1145    Results for [name] (MRN [id number]) as of 10/11/2012 12:29  Ref. Range 10/09/2012 15:39  Erythropoietin Latest Range: 2.6-18.5 mIU/mL 12.3   Results for [name] (MRN [id number]) as of 10/11/2012 12:29  Ref. Range 05/16/2012 10:41 08/08/2012 00:00 08/27/2012 17:31 10/09/2012 15:39 10/09/2012 15:39  Testosterone Latest Range: 10-70 ng/dL   84.13 (H)  20   RADIOGRAPHY: No results found.     REVIEW OF SYSTEMS:  Constitutional: Denies fevers, chills or abnormal weight loss Eyes: Denies blurriness of vision Ears, nose, mouth, throat, and face: Denies mucositis or sore throat Respiratory: Denies cough, dyspnea or wheezes Cardiovascular: Denies palpitation, chest discomfort or lower extremity swelling Gastrointestinal:  Denies nausea, heartburn or change in bowel habits Skin: Denies abnormal skin rashes Lymphatics: Denies new lymphadenopathy or easy bruising Neurological:Denies numbness, tingling or new weaknesses Behavioral/Psych: Mood is stable, no new changes  All other systems were reviewed with the patient and are negative.  PHYSICAL EXAM:  height is 5' 3.5" (1.613 m) and weight is 212 lb 12.8 oz (96.525 kg). Her oral temperature is 97.9 F (36.6 C). Her blood pressure is 129/73 and her pulse is 80. Her respiration is 18.    GENERAL:alert, no distress and comfortable; Obese female.  SKIN: skin color, texture, turgor are  normal but slight increased coloration at the soles of the feet.  EYES: normal, Conjunctiva are pink and non-injected, sclera clear OROPHARYNX:no exudate, no erythema and lips, buccal mucosa, and tongue normal  NECK: supple, thyroid normal size, non-tender, without nodularity LYMPH:  no palpable lymphadenopathy in the cervical, axillary or inguinal LUNGS: clear to auscultation and percussion with normal breathing effort HEART: regular rate & rhythm and no murmurs and no lower extremity edema ABDOMEN:abdomen soft, non-tender and normal bowel sounds Musculoskeletal: no clubbing and no edema NEURO: alert & oriented x 3 with fluent speech, no focal motor/sensory deficits   IMPRESSION:  Erythrocystosis likely secondary to testosterone.  Rule out PCV.  --Erythrocytosis is a common adverse effect of testosterone administration, particularly with testosterone ester injections like testosterone cypionate (Calof OM, J Gerontol A Biol Sci Med Sci. 2005). It is of concern because the risk of venous thromboembolic disease is directly related to hematocrit.  The hematocrit before her use appears to have been normal. The hematocrit measured again several months after therapy has been decreasing.  She stopped the testosterone about 1.5 months ago, and her hematocrit is now going down to  45.6.   Based on the high likelihood as testosterone-induced erythrocystosis, the Endocrine Society Guidelines suggest stopping therapy if the hematocrit increases to ?54 percent. The hematocrit should be re-evaluated two months after the decrease or discontinuation as it has been done today. If the hematocrit normalizes, a lower dose of testosterone should be continued or restarted.   PLAN:  --We also sent JAK2 and EPO levels to rule out primary causes which are unlikely at this point. Her EPO level is normal and expected to low in PCV.    --Can repeat CBC at PCP office in one month. Follow up with hematology in one month.     Edoardo Laforte, MD 10/09/2012 4:31 PM

## 2012-10-09 NOTE — Telephone Encounter (Signed)
Gave pt appt for MD on October 2014, pt sent to labs today

## 2012-10-10 ENCOUNTER — Telehealth: Payer: Self-pay

## 2012-10-10 NOTE — Telephone Encounter (Signed)
Pt called with information she forgot yesterday at MD visit. She has pain in her hips a lot and needs to change positions frequently at night. She is most concerned about her L leg pain and what she terms "loss of use". She had a reclast drip ordered by Dr Arnette Norris in 2011 for osteoporosis. This information was forwarded to Dr Rosie Fate.

## 2012-10-15 ENCOUNTER — Ambulatory Visit: Payer: 59 | Admitting: Internal Medicine

## 2012-10-15 ENCOUNTER — Other Ambulatory Visit: Payer: Self-pay

## 2012-10-15 MED ORDER — DICYCLOMINE HCL 10 MG PO CAPS: 10.0000 mg | ORAL_CAPSULE | Freq: Three times a day (TID) | ORAL | Status: DC

## 2012-10-23 ENCOUNTER — Other Ambulatory Visit: Payer: Self-pay | Admitting: Internal Medicine

## 2012-10-23 DIAGNOSIS — D751 Secondary polycythemia: Secondary | ICD-10-CM

## 2012-10-24 ENCOUNTER — Encounter: Payer: Self-pay | Admitting: Internal Medicine

## 2012-10-24 ENCOUNTER — Other Ambulatory Visit: Payer: Medicare Other | Admitting: Lab

## 2012-10-24 ENCOUNTER — Ambulatory Visit (HOSPITAL_BASED_OUTPATIENT_CLINIC_OR_DEPARTMENT_OTHER): Payer: Medicare Other | Admitting: Internal Medicine

## 2012-10-24 ENCOUNTER — Ambulatory Visit (HOSPITAL_BASED_OUTPATIENT_CLINIC_OR_DEPARTMENT_OTHER): Payer: Medicare Other | Admitting: Lab

## 2012-10-24 VITALS — BP 139/75 | HR 70 | Temp 97.7°F | Resp 18 | Ht 63.0 in | Wt 213.5 lb

## 2012-10-24 DIAGNOSIS — D751 Secondary polycythemia: Secondary | ICD-10-CM

## 2012-10-24 LAB — CBC WITH DIFFERENTIAL/PLATELET
BASO%: 0.7 % (ref 0.0–2.0)
Eosinophils Absolute: 0.1 10*3/uL (ref 0.0–0.5)
HCT: 45.6 % (ref 34.8–46.6)
LYMPH%: 28.4 % (ref 14.0–49.7)
MCHC: 33.1 g/dL (ref 31.5–36.0)
MONO#: 0.4 10*3/uL (ref 0.1–0.9)
NEUT#: 4.7 10*3/uL (ref 1.5–6.5)
NEUT%: 63.8 % (ref 38.4–76.8)
Platelets: 243 10*3/uL (ref 145–400)
RBC: 5.15 10*6/uL (ref 3.70–5.45)
WBC: 7.4 10*3/uL (ref 3.9–10.3)
lymph#: 2.1 10*3/uL (ref 0.9–3.3)

## 2012-10-24 NOTE — Progress Notes (Signed)
[facility] OFFICE PROGRESS NOTE  Oliver Barre, MD [address] [place] [address]  DIAGNOSIS: Polycythemia, secondary  Chief Complaint  Patient presents with  . Polycythemia, secondary    CURRENT THERAPY: None.  INTERVAL HISTORY: [name] 62 y.o. female who was referred to our office by Dr. Jonny Ruiz on 09/17/20114 with concerns of primary polycythemia vera who is now here for followup.  She was given testosterone (Androgel) 50 mg/5 gm gel to place onto the skin daily about 2 years ago but stopped 2 months ago. Her CBC on her last visit was normal with white blood cell count of 6.7, hemoglobin 15.2, hematocrit 45.6, and platelets of 283. Date her complaint is abdominal pain which she describes as cramping. Reports is not positional or related to foods or GERD-like or related to her bowel movements. She does endorse long-standing constipation for which she takes MiraLax every 2-3 days. She denies diarrhea, fever, chills. Since her last visit she denies hospitalizations or emergency room visits.   MEDICAL HISTORY: Past Medical History  Diagnosis Date  . Endometriosis   . Osteopenia   . Asthma   . Fibromyalgia   . Thyroid disease     Hypothyroid  . Elevated cholesterol   . Migraines   . Bowel obstruction   . GERD (gastroesophageal reflux disease)   . Impaired glucose tolerance 02/25/2011  . Complication of anesthesia     "I have the shakes"  . Heart murmur     mvp  . Shortness of breath     with exertion  . Arthritis   . Low back pain   . Hx of oral aphthous ulcers   . Biliary dyskinesia   . Diverticulosis   . Nocturia   . Hypothyroidism     hx nodule on thyroid   . Sleep apnea     has not used c-pap x 1 yr.   . Depression   . Cervical dysplasia     INTERIM HISTORY: has GOITER; HYPOTHYROIDISM; HYPERLIPIDEMIA; OBESITY; SICKLE CELL TRAIT; ANXIETY; DEPRESSION; SLEEP APNEA, OBSTRUCTIVE, MODERATE; HYPERTENSION; VENOUS INSUFFICIENCY; ALLERGIC  RHINITIS; ASTHMA; COPD; GERD; PEPTIC ULCER DISEASE; CONSTIPATION; Irritable bowel syndrome; OSTEOARTHRITIS; LUMBAR RADICULOPATHY, LEFT; FIBROMYALGIA; OSTEOPOROSIS; FATIGUE, CHRONIC; PERIPHERAL EDEMA; MITRAL VALVE PROLAPSE, HX OF; COLONIC POLYPS, HX OF; THYROID NODULE, RIGHT; BACK PAIN; Family hx of colon cancer; OTHER&UNSPECIFIED DISEASES THE ORAL SOFT TISSUES; Endometriosis; Preventative health care; Impaired glucose tolerance; Lesion of mouth; Chronic sinusitis; Spell of dizziness; Bruising; High risk medication use; Cervical dysplasia; Chest pain; and Polycythemia, secondary on her problem list.    ALLERGIES:  is allergic to celecoxib; codeine; duloxetine; erythromycin; escitalopram oxalate; ezetimibe; iohexol; milnacipran; pregabalin; rofecoxib; statins; and tramadol.  MEDICATIONS: has a current medication list which includes the following prescription(s): albuterol, albuterol, alcaftadine, aspirin, calcium carbonate-vitamin d, cyclobenzaprine, cyclosporine, dicyclomine, estradiol acetate, fentanyl, fiber, fluticasone, hydrocortisone butyrate, methocarbamol, multivitamin with minerals, naproxen, omeprazole, polyethyl glycol-propyl glycol, polyethylene glycol powder, thyroid, tizanidine, and topiramate er.  SURGICAL HISTORY:  Past Surgical History  Procedure Laterality Date  . Tonsillectomy    . Pelvic laparoscopy  1995,2002    DL LSO DL RSO  . Vaginal hysterectomy  1983  . Oophorectomy      LSO-BSO  . Breast surgery      Benign breast cyst  . Cholecystectomy  12/05/2011    Procedure: LAPAROSCOPIC CHOLECYSTECTOMY;  Surgeon: Axel Filler, MD;  Location: [facility];  Service: General;  Laterality: N/A;  . Colposcopy    . Gynecologic cryosurgery  REVIEW OF SYSTEMS:   Constitutional: Denies fevers, chills or abnormal weight loss Eyes: Denies blurriness of vision Ears, nose, mouth, throat, and face: Denies mucositis or sore throat Respiratory: Denies cough, dyspnea or  wheezes Cardiovascular: Denies palpitation, chest discomfort or lower extremity swelling Gastrointestinal:  Denies nausea, heartburn or change in bowel habits Skin: Denies abnormal skin rashes Lymphatics: Denies new lymphadenopathy or easy bruising Neurological:Denies numbness, tingling or new weaknesses Behavioral/Psych: Mood is stable, no new changes  All other systems were reviewed with the patient and are negative.  PHYSICAL EXAMINATION: ECOG PERFORMANCE STATUS: 0 - Asymptomatic  Blood pressure 139/75, pulse 70, temperature 97.7 F (36.5 C), temperature source Oral, resp. rate 18, height 5\' 3"  (1.6 m), weight 213 lb 8 oz (96.843 kg), SpO2 100.00%.  GENERAL:alert, no distress and comfortable; middle-aged female mildly obese SKIN: skin color, texture, turgor are normal, no rashes or significant lesions EYES: normal, Conjunctiva are pink and non-injected, sclera clear OROPHARYNX:no exudate, no erythema and lips, buccal mucosa, and tongue normal  NECK: supple, thyroid normal size, non-tender, without nodularity LYMPH:  no palpable lymphadenopathy in the cervical, axillary or supraclavicular LUNGS: clear to auscultation and percussion with normal breathing effort HEART: regular rate & rhythm and no murmurs and no lower extremity edema ABDOMEN:abdomen soft, non-tender and normal bowel sounds Musculoskeletal:no cyanosis of digits and no clubbing  NEURO: alert & oriented x 3 with fluent speech, no focal motor/sensory deficits   LABORATORY DATA: Results for orders placed in visit on 10/24/12 (from the past 48 hour(s))  CBC WITH DIFFERENTIAL     Status: None   Collection Time    10/24/12  8:43 AM      Result Value Range   WBC 7.4  3.9 - 10.3 10e3/uL   NEUT# 4.7  1.5 - 6.5 10e3/uL   HGB 15.1  11.6 - 15.9 g/dL   HCT 78.4  69.6 - 29.5 %   Platelets 243  145 - 400 10e3/uL   MCV 88.5  79.5 - 101.0 fL   MCH 29.3  25.1 - 34.0 pg   MCHC 33.1  31.5 - 36.0 g/dL   RBC 2.84  1.32 - 4.40  10e6/uL   RDW 13.7  11.2 - 14.5 %   lymph# 2.1  0.9 - 3.3 10e3/uL   MONO# 0.4  0.1 - 0.9 10e3/uL   Eosinophils Absolute 0.1  0.0 - 0.5 10e3/uL   Basophils Absolute 0.1  0.0 - 0.1 10e3/uL   NEUT% 63.8  38.4 - 76.8 %   LYMPH% 28.4  14.0 - 49.7 %   MONO% 5.9  0.0 - 14.0 %   EOS% 1.2  0.0 - 7.0 %   BASO% 0.7  0.0 - 2.0 %     Labs:  Lab Results  Component Value Date   WBC 7.4 10/24/2012   HGB 15.1 10/24/2012   HCT 45.6 10/24/2012   MCV 88.5 10/24/2012   PLT 243 10/24/2012   NEUTROABS 4.7 10/24/2012      Chemistry      Component Value Date/Time   NA 142 08/27/2012 1731   K 3.5 08/27/2012 1731   CL 107 08/27/2012 1731   CO2 26 08/27/2012 1731   BUN 11 08/27/2012 1731   CREATININE 1.1 08/27/2012 1731      Component Value Date/Time   CALCIUM 9.3 08/27/2012 1731   ALKPHOS 55 08/27/2012 1731   AST 17 08/27/2012 1731   ALT 11 08/27/2012 1731   BILITOT 0.7 08/27/2012 1731  RADIOGRAPHIC STUDIES: No results found.  ASSESSMENT/PLAN: [name] 62 y.o. female with a history of Erythrocystosis likely secondary to testosterone now resolved.    --We explained that erythrocytosis is a common adverse effect of testosterone administration, particularly with testosterone ester injections like testosterone cypionate (Calof OM, J Gerontol A Biol Sci Med Sci. 2005). It is of concern because the risk of venous thromboembolic disease is directly related to hematocrit.  We reviewed with her that her hematocrit before her use of testosterone appears to have been normal. The hematocrit measured again several months after therapy has been decreasing. She stopped the testosterone about 2 months ago, and her hematocrit today is within normal limits there Her JAK2 was negative; she had a normal EPO level. This makes primary PCV less likely.  All questions were answered. The patient knows to call the clinic with any problems, questions or concerns. We can certainly see the patient much sooner if necessary. Patient was  provided after visit summary and copies of her laboratories. She was instructed to followup with [place] as needed for any changes in her complete blood count.  I spent 10 minutes counseling the patient face to face. The total time spent in the appointment was 15 minutes.    Eder Macek, MD 10/24/2012 9:12 AM

## 2012-10-28 ENCOUNTER — Telehealth: Payer: Self-pay | Admitting: Pulmonary Disease

## 2012-10-28 NOTE — Telephone Encounter (Signed)
I spoke with pt. She was placed on auto setting back in June. That she recalls a download has not been done from her machine. She has pending appt with [facility] tomorrow. Pt does have a resmed machine she believes. I advised will call [facility] to see. I called [facility] and had to [facility] with answering service

## 2012-10-29 ENCOUNTER — Encounter: Payer: Self-pay | Admitting: Pulmonary Disease

## 2012-10-29 ENCOUNTER — Ambulatory Visit (INDEPENDENT_AMBULATORY_CARE_PROVIDER_SITE_OTHER): Payer: Medicare Other | Admitting: Pulmonary Disease

## 2012-10-29 VITALS — BP 122/78 | HR 72 | Temp 98.0°F | Ht 63.75 in | Wt 215.2 lb

## 2012-10-29 DIAGNOSIS — G4733 Obstructive sleep apnea (adult) (pediatric): Secondary | ICD-10-CM

## 2012-10-29 NOTE — Telephone Encounter (Signed)
I spoke with Iris at [facility] and she states they did a download yesterday on the pt and will fax it over to triage fax. Will await fax. Carron Curie, CMA

## 2012-10-29 NOTE — Assessment & Plan Note (Signed)
The patient has had difficulty wearing CPAP because of her chronic migraines being exacerbated, and also because of issues with the high end autoset pressure.  I would like to change her over to a fixed pressure and see if she improves.  I have stressed to her the importance of wearing CPAP on a regular basis, and also to work on aggressive weight loss.  She will also need to keep up with her mask cushion changes.

## 2012-10-29 NOTE — Patient Instructions (Addendum)
Will put your machine on a fixed pressure.  Let me know if this is not working well for you. Try and wear cpap as much as possible. Work on weight loss Keep up with mask changes and supplies. followup with me again in one year if doing well.

## 2012-10-29 NOTE — Progress Notes (Signed)
  Subjective:    Patient ID: [name], female    DOB: 31-Dec-1950, 62 y.o.   MRN: [id number]  HPI Patient comes in today for followup of her obstructive sleep apnea.  She has poor compliance on her CPAP download, and she has been having issues with chronic migraines which interferes with her ability to wear the CPAP mask.  She initially was on an office setting of 5-20, but could not tolerate the high end pressure.  This was decreased to 5-15.  The patient's download shows excellent control of her sleep apnea with a CPAP pressure of 9 cm, and only has minimal increased mask leak.   Review of Systems  Constitutional: Negative for fever and unexpected weight change.  HENT: Negative for ear pain, nosebleeds, congestion, sore throat, rhinorrhea, sneezing, trouble swallowing, dental problem, postnasal drip and sinus pressure.   Eyes: Negative for redness and itching.  Respiratory: Negative for cough, chest tightness, shortness of breath and wheezing.   Cardiovascular: Negative for palpitations and leg swelling.  Gastrointestinal: Negative for nausea and vomiting.  Genitourinary: Negative for dysuria.  Musculoskeletal: Negative for joint swelling.  Skin: Negative for rash.  Neurological: Negative for headaches.  Hematological: Does not bruise/bleed easily.  Psychiatric/Behavioral: Negative for dysphoric mood. The patient is not nervous/anxious.        Objective:   Physical Exam Obese female in no acute distress Nose without purulence or discharge noted Neck without lymphadenopathy or thyromegaly No skin breakdown or pressure necrosis from CPAP mask Lower extremities have mild edema, no cyanosis Alert, does not appear to be overly sleepy, moves all 4 extremities.       Assessment & Plan:

## 2012-10-30 NOTE — Telephone Encounter (Signed)
This was received and giving to ashtyn. Pt also had appt 10/29/12

## 2012-12-12 ENCOUNTER — Other Ambulatory Visit: Payer: Self-pay | Admitting: Internal Medicine

## 2012-12-12 DIAGNOSIS — Z1231 Encounter for screening mammogram for malignant neoplasm of breast: Secondary | ICD-10-CM

## 2013-01-07 ENCOUNTER — Other Ambulatory Visit: Payer: Self-pay | Admitting: Obstetrics and Gynecology

## 2013-01-14 ENCOUNTER — Ambulatory Visit: Payer: Medicare Other

## 2013-01-17 ENCOUNTER — Ambulatory Visit: Payer: Medicare Other

## 2013-01-22 ENCOUNTER — Ambulatory Visit
Admission: RE | Admit: 2013-01-22 | Discharge: 2013-01-22 | Disposition: A | Discharge status: Home / Self Care | Payer: 59 | Source: Ambulatory Visit | Attending: Internal Medicine | Admitting: Internal Medicine

## 2013-01-22 DIAGNOSIS — Z1231 Encounter for screening mammogram for malignant neoplasm of breast: Secondary | ICD-10-CM

## 2013-01-30 ENCOUNTER — Other Ambulatory Visit: Payer: Self-pay | Admitting: Internal Medicine

## 2013-01-30 DIAGNOSIS — R928 Other abnormal and inconclusive findings on diagnostic imaging of breast: Secondary | ICD-10-CM

## 2013-02-03 ENCOUNTER — Ambulatory Visit
Admission: RE | Admit: 2013-02-03 | Discharge: 2013-02-03 | Disposition: A | Discharge status: Home / Self Care | Payer: 59 | Source: Ambulatory Visit | Attending: Internal Medicine | Admitting: Internal Medicine

## 2013-02-03 DIAGNOSIS — R928 Other abnormal and inconclusive findings on diagnostic imaging of breast: Secondary | ICD-10-CM

## 2013-02-05 ENCOUNTER — Other Ambulatory Visit: Payer: Medicare Other

## 2013-02-13 ENCOUNTER — Ambulatory Visit (INDEPENDENT_AMBULATORY_CARE_PROVIDER_SITE_OTHER): Payer: Medicare Other | Admitting: Gynecology

## 2013-02-13 ENCOUNTER — Encounter: Payer: Self-pay | Admitting: Gynecology

## 2013-02-13 VITALS — BP 130/78 | Ht 64.0 in | Wt 219.0 lb

## 2013-02-13 DIAGNOSIS — N898 Other specified noninflammatory disorders of vagina: Secondary | ICD-10-CM

## 2013-02-13 DIAGNOSIS — Z01419 Encounter for gynecological examination (general) (routine) without abnormal findings: Secondary | ICD-10-CM

## 2013-02-13 MED ORDER — ESTRADIOL ACETATE 0.1 MG/24HR VA RING: VAGINAL_RING | VAGINAL | Status: DC

## 2013-02-13 MED ORDER — METRONIDAZOLE 500 MG PO TABS: 500.0000 mg | ORAL_TABLET | Freq: Two times a day (BID) | ORAL | Status: DC

## 2013-02-13 NOTE — Progress Notes (Signed)
[name] 01/02/1951 [id number]        62 y.o.  [id number] for followup exam.  Former patient of Dr. Cherylann Banas. Several issues noted below.  Past medical history,surgical history, problem list, medications, allergies, family history and social history were all reviewed and documented in the EPIC chart.  ROS:  Performed and pertinent positives and negatives are included in the history, assessment and plan .  Exam: Kim assistant Filed Vitals:   02/13/13 1506  BP: 130/78  Height: $Remove'5\' 4"'LRxPveu$  (1.626 m)  Weight: 219 lb (99.338 kg)   General appearance  Normal Skin grossly normal Head/Neck normal with no cervical or supraclavicular adenopathy thyroid normal Lungs  clear Cardiac RR, without RMG Abdominal  soft, nontender, without masses, organomegaly or hernia Breasts  examined lying and sitting without masses, retractions, discharge or axillary adenopathy. Pelvic  Ext/BUS/vagina  Normal with thick white discharge, Femring in place   Adnexa  Without masses or tenderness    Anus and perineum  Normal   Rectovaginal  Normal sphincter tone without palpated masses or tenderness.    Assessment/Plan:  63 y.o. [id number] female for followup exam.   1. History of TVH 1983 for dysfunctional bleeding. LSO 1995 and subsequent RSO 2002 with findings of endometriosis and pelvic adhesions. On Femring 0.1 mg having switched from [place] last year. Still having night sweats but tolerable.  I reviewed the whole issue of HRT with her to include the WHI study with increased risk of stroke, heart attack, DVT and breast cancer. The ACOG and NAMS statements for lowest dose for the shortest period of time reviewed. Transdermal versus oral first-pass effect benefit discussed. As she is still having hot flashes options to switch to oral and increase the oral dose discussed. I reviewed to the increased risk of thrombosis associated with oral estrogen. I also reviewed given her past medical history and multiple issues that  there may be other etiologies of her hot flashes. She did have a recent TSH which was normal. After lengthy discussion the patient would prefer to stay on the Femring and I refilled her x1 year. 2. Osteopenia. Patient has a somewhat confusing history to include a DEXA 2012 with T score -1.3 for which she apparently received one dose of IV Reclast but then no further treatment. And has listed in her problem list osteoporosis. Recommend repeating DEXA now and treating based on this most recent study and she'll go ahead and schedule this now. Calcium and vitamin D recommendations reviewed. 3. Pap smear 2013. No Pap smear done today. Status post hysterectomy for benign indications. Per Dr. Valeta Harms note there was some question about whether she had dysplasia after her hysterectomy with cryosurgery or whether this preceded her hysterectomy. Regardless she has had normal Pap smears since then. Will plan repeat Pap smear at 3 year interval. 4. Colonoscopy 2013. Repeat it today are recommended interval. 5. Mammography 12/2012. Very strong history of breast cancer to include mother with premenopausal breast cancer, maternal grandmother and 2 maternal aunts. Had discussed BRCA testing with Dr. Cherylann Banas but never followed up for this. Patient agrees to do so today and we'll go ahead and have this drawn.  Did have MRI 2013. Will await BRCA testing results. Regardless if negative given her strong family history he'll MRI warranted now and will recommend patient to schedule. 6. Health maintenance. No blood work done today as it is all done through her other 14 offices to follow her for her multiple medical issues.   Note: This  document was prepared with digital dictation and possible smart phrase technology. Any transcriptional errors that result from this process are unintentional.   Anastasio Auerbach MD, 4:59 PM 02/13/2013

## 2013-02-13 NOTE — Patient Instructions (Addendum)
Office will call you to arrange for breast MRI due to your strong family history of breast cancer. Office will call you with the results of your breast cancer gene testing. Followup for bone density as scheduled. Followup in one year for annual exam.

## 2013-02-14 ENCOUNTER — Telehealth: Payer: Self-pay | Admitting: *Deleted

## 2013-02-14 DIAGNOSIS — Z803 Family history of malignant neoplasm of breast: Secondary | ICD-10-CM

## 2013-02-14 LAB — URINALYSIS W MICROSCOPIC + REFLEX CULTURE
BACTERIA UA: NONE SEEN
BILIRUBIN URINE: NEGATIVE
Casts: NONE SEEN
Crystals: NONE SEEN
Glucose, UA: NEGATIVE mg/dL
HGB URINE DIPSTICK: NEGATIVE
KETONES UR: NEGATIVE mg/dL
Leukocytes, UA: NEGATIVE
Nitrite: NEGATIVE
PROTEIN: NEGATIVE mg/dL
Specific Gravity, Urine: 1.014 (ref 1.005–1.030)
UROBILINOGEN UA: 0.2 mg/dL (ref 0.0–1.0)
pH: 8 (ref 5.0–8.0)

## 2013-02-14 LAB — WET PREP FOR TRICH, YEAST, CLUE
CLUE CELLS WET PREP: NONE SEEN
TRICH WET PREP: NONE SEEN
WBC, Wet Prep HPF POC: NONE SEEN
YEAST WET PREP: NONE SEEN

## 2013-02-14 NOTE — Telephone Encounter (Signed)
Message copied by Thamas Jaegers on Fri Feb 14, 2013 11:10 AM ------      Message from: Anastasio Auerbach      Created: Thu Feb 13, 2013  5:16 PM       Tell patient I would like her to arrange for breast MRI. Her last MRI was 2013. Given her strong family history of breast cancer I think this should be done as an adjuvant to her mammography. ------

## 2013-02-14 NOTE — Telephone Encounter (Signed)
Order placed they will contact patient to schedule.

## 2013-02-17 NOTE — Telephone Encounter (Signed)
Appointment on 02/25/13 @ 12:30 pm at [facility]. Pt aware.

## 2013-02-21 ENCOUNTER — Other Ambulatory Visit: Payer: Medicare Other

## 2013-02-21 ENCOUNTER — Telehealth: Payer: Self-pay

## 2013-02-21 NOTE — Telephone Encounter (Signed)
I called patient to make her aware that [facility] called today and has said it will be within another 2 business days before they let us know their decision about prior auth of MRI as it goes back to physician reviewer for a second time. I did not provide them with any new info today and did ask about expediting it to have answer before Tuesday and I was told that cannot happen.  I suggested to patient that she may want to call and reschedule her MRI a few days later.  I just wanted her to be aware what [employer] was doing so that she was not surprised at last minute.

## 2013-02-23 DIAGNOSIS — Z1371 Encounter for nonprocreative screening for genetic disease carrier status: Secondary | ICD-10-CM

## 2013-02-23 HISTORY — DX: Encounter for nonprocreative screening for genetic disease carrier status: Z13.71

## 2013-02-24 ENCOUNTER — Telehealth: Payer: Self-pay

## 2013-02-24 ENCOUNTER — Other Ambulatory Visit: Payer: Self-pay | Admitting: Gynecology

## 2013-02-24 MED ORDER — CLINDAMYCIN PHOSPHATE 2 % VA CREA: 1.0000 | TOPICAL_CREAM | Freq: Every day | VAGINAL | Status: AC

## 2013-02-24 NOTE — Telephone Encounter (Signed)
Femring out, Cleocin vaginal cream nightly x7 days

## 2013-02-24 NOTE — Telephone Encounter (Signed)
Finished the Metronidazole x 7 days that you prescribed for her last week.  She said she is still noticing vaginal odor and she is concerned infection not gone. Urine a little dark. No other urinary sx.  She said during the treatment she removed her Femring for a few days and then put it back in. She was concerned it had re-infected her.  What to rec?

## 2013-02-24 NOTE — Telephone Encounter (Signed)
Patient informed. Rx sent 

## 2013-02-24 NOTE — Telephone Encounter (Signed)
I called patient to let her know I received denial of authorization for her MRI of breasts from [facility].  She will need to reschedule/cancel her MRI scheduled tomorrow or sign waiver per the radiology center. She will talk with them.  I will see what Dr. Loetta Rough recommends.

## 2013-02-25 ENCOUNTER — Inpatient Hospital Stay: Admission: RE | Admit: 2013-02-25 | Payer: Medicare Other | Source: Ambulatory Visit

## 2013-02-27 ENCOUNTER — Encounter: Payer: Self-pay | Admitting: Obstetrics and Gynecology

## 2013-02-28 ENCOUNTER — Encounter: Payer: Self-pay | Admitting: Gynecology

## 2013-02-28 ENCOUNTER — Telehealth: Payer: Self-pay | Admitting: Gynecology

## 2013-02-28 NOTE — Telephone Encounter (Signed)
Pt informed with the below note. 

## 2013-02-28 NOTE — Telephone Encounter (Signed)
Error I left the below on pt voicemail.

## 2013-02-28 NOTE — Telephone Encounter (Signed)
Call patient and tell her that her BRCA testing for breast cancer gene is negative. We will be mailing her a copy of the test results to have for her records.

## 2013-03-04 ENCOUNTER — Inpatient Hospital Stay: Admission: RE | Admit: 2013-03-04 | Payer: Medicare Other | Source: Ambulatory Visit

## 2013-03-05 ENCOUNTER — Telehealth: Payer: Self-pay | Admitting: *Deleted

## 2013-03-05 MED ORDER — ALBUTEROL SULFATE HFA 108 (90 BASE) MCG/ACT IN AERS: 2.0000 | INHALATION_SPRAY | Freq: Four times a day (QID) | RESPIRATORY_TRACT | Status: DC | PRN

## 2013-03-05 NOTE — Telephone Encounter (Signed)
Patient out of rescue inhaler and none available in our dept nor pulmonary.  Refilled per protocol.

## 2013-03-05 NOTE — Telephone Encounter (Signed)
Patient phoned needing sample of rescue inhaler.  None available.  [facility].

## 2013-03-07 ENCOUNTER — Telehealth: Payer: Self-pay

## 2013-03-07 NOTE — Telephone Encounter (Signed)
I called patient to let her know I received letter from [facility] regarding our appeal.  "[employer] has received the new and/or additional clinical information for member. After review of this information, [employer] has determined that the clinical information provided does not change the original adverse decision or rationale for the requested service." Patient informed that the appeal was denied.  Patient said that since we started this process she has found out that her [facility] testing is negative and this has offered her a lot of reassurance.  She just wants to go ahead and cancel the MRI for now.  She does not want to pay out of pocket to have it done.

## 2013-03-17 ENCOUNTER — Other Ambulatory Visit: Payer: Self-pay | Admitting: Rheumatology

## 2013-03-17 ENCOUNTER — Ambulatory Visit
Admission: RE | Admit: 2013-03-17 | Discharge: 2013-03-17 | Disposition: A | Discharge status: Home / Self Care | Payer: 59 | Source: Ambulatory Visit | Attending: Rheumatology | Admitting: Rheumatology

## 2013-03-17 ENCOUNTER — Encounter: Payer: Self-pay | Admitting: Gynecology

## 2013-03-17 DIAGNOSIS — M25562 Pain in left knee: Secondary | ICD-10-CM

## 2013-03-17 DIAGNOSIS — M25462 Effusion, left knee: Secondary | ICD-10-CM

## 2013-03-18 ENCOUNTER — Ambulatory Visit (INDEPENDENT_AMBULATORY_CARE_PROVIDER_SITE_OTHER): Payer: Medicare Other | Admitting: Gynecology

## 2013-03-18 ENCOUNTER — Encounter: Payer: Self-pay | Admitting: Gynecology

## 2013-03-18 DIAGNOSIS — N898 Other specified noninflammatory disorders of vagina: Secondary | ICD-10-CM

## 2013-03-18 DIAGNOSIS — N951 Menopausal and female climacteric states: Secondary | ICD-10-CM

## 2013-03-18 LAB — THYROID PANEL
Free T4: 0.84 ng/dL (ref 0.80–1.80)
T3 UPTAKE: 31.8 % (ref 22.5–37.0)
T4 TOTAL: 8.2 ug/dL (ref 5.0–12.5)
TSH: 1.352 u[IU]/mL (ref 0.350–4.500)

## 2013-03-18 LAB — WET PREP FOR TRICH, YEAST, CLUE
Clue Cells Wet Prep HPF POC: NONE SEEN
Trich, Wet Prep: NONE SEEN
WBC, Wet Prep HPF POC: NONE SEEN
Yeast Wet Prep HPF POC: NONE SEEN

## 2013-03-18 NOTE — Patient Instructions (Signed)
Use boric acid suppositories 2-3 times weekly. Follow up if her symptoms persist. Office will contact you in reference to your lab results.

## 2013-03-18 NOTE — Progress Notes (Signed)
[name] December 07, 1950 [id number]        62 y.o.  [id number] presents with 2 issues: 1. Continued vaginal odor and slight discharge. Was treated initially with Flagyl orally and then Cleocin vaginal cream but still has a persistence of her discharge. She does note that she before was on boric acid suppositories 2-3 times weekly by Dr. Cherylann Banas but has discontinued these for some time. 2. Night sweats and hot flashes. Patient's on Femring 0.1 mg but is having hot flashes and night sweats. Had previously been on testosterone cream but this ultimately was discontinued during evaluation for polycythemia vera.  Past medical history,surgical history, problem list, medications, allergies, family history and social history were all reviewed and documented in the EPIC chart.  Exam: Kim assistant General appearance  Normal External BUS vagina with atrophic changes. Scant white discharge noted. Femring in place. Bimanual without masses or tenderness.  Assessment/Plan:  63 y.o. [id number]   1. Persistent vaginal odor. Exam and wet prep are unremarkable. Recommended trial of reinitiating boric acid suppositories 600 mg twice weekly. [facility] Followup if symptoms persist. 2. Hot flushes night sweats with Femring. Check estradiol level for absorption and thyroid panel rule out thyroid dysfunction. Discussed possibly switching her estrogen regimen but we'll further discuss after the lab results.   Note: This document was prepared with digital dictation and possible smart phrase technology. Any transcriptional errors that result from this process are unintentional.   Anastasio Auerbach MD, 3:05 PM 03/18/2013

## 2013-03-19 ENCOUNTER — Other Ambulatory Visit: Payer: Self-pay | Admitting: Gynecology

## 2013-03-19 ENCOUNTER — Other Ambulatory Visit: Payer: Self-pay

## 2013-03-19 ENCOUNTER — Telehealth: Payer: Self-pay

## 2013-03-19 ENCOUNTER — Telehealth: Payer: Self-pay | Admitting: *Deleted

## 2013-03-19 LAB — URINALYSIS W MICROSCOPIC + REFLEX CULTURE
Bacteria, UA: NONE SEEN
Bilirubin Urine: NEGATIVE
Casts: NONE SEEN
Glucose, UA: NEGATIVE mg/dL
Hgb urine dipstick: NEGATIVE
Ketones, ur: NEGATIVE mg/dL
LEUKOCYTES UA: NEGATIVE
Nitrite: NEGATIVE
PROTEIN: NEGATIVE mg/dL
Specific Gravity, Urine: 1.017 (ref 1.005–1.030)
UROBILINOGEN UA: 0.2 mg/dL (ref 0.0–1.0)
pH: 7.5 (ref 5.0–8.0)

## 2013-03-19 LAB — ESTRADIOL: Estradiol: 103.4 pg/mL

## 2013-03-19 MED ORDER — ESTRADIOL 0.1 MG/24HR TD PTTW: 1.0000 | MEDICATED_PATCH | TRANSDERMAL | Status: DC

## 2013-03-19 MED ORDER — ESTRADIOL 0.1 MG/24HR TD PTWK: 0.1000 mg | MEDICATED_PATCH | TRANSDERMAL | Status: DC

## 2013-03-19 MED ORDER — NONFORMULARY OR COMPOUNDED ITEM: Status: DC

## 2013-03-19 NOTE — Telephone Encounter (Signed)
Message copied by Thamas Jaegers on Wed Mar 19, 2013  2:54 PM ------      Message from: Anastasio Auerbach      Created: Tue Mar 18, 2013  3:09 PM       [facility] or to acid suppositories 600 mg #30 intravaginal 2-3 times weekly refill x2 ------

## 2013-03-19 NOTE — Telephone Encounter (Signed)
Climara 0.1 mg patch, three-month course, patient to call in followup

## 2013-03-19 NOTE — Telephone Encounter (Signed)
Message copied by Ramond Craver on Wed Mar 19, 2013  2:57 PM ------      Message from: Anastasio Auerbach      Created: Wed Mar 19, 2013  8:46 AM       Tell patient that her hormone level is in a good range. I do not think that her hot flashes are due to a low estrogen. 2 options would be to try a little higher dose of estrogen just to see if it does not help or for her to followup with her primary person to look for other reasons for her night sweats. Her thyroid panel was also normal. If she would like to try the higher estrogen dose that she would need to discontinue the membrane and we was put her on an oral dose of estrogen which may carry a slightly higher risk of blood clots. ------

## 2013-03-19 NOTE — Telephone Encounter (Signed)
Patient of informed of below. She would like to try a higher dose of estrogen and she understands she will need to discontinue the Femring.  However, she wanted me to ask you if she could use a transdermal patch instead or an oral estrogen?

## 2013-03-19 NOTE — Telephone Encounter (Signed)
Rx sent 

## 2013-03-19 NOTE — Telephone Encounter (Signed)
rx called in

## 2013-03-19 NOTE — Telephone Encounter (Signed)
We can try the Vivelle 0.1 which is the highest dose patch. Prescribed 3 months course and patient to call at that point to let us know how she's doing.

## 2013-03-19 NOTE — Telephone Encounter (Signed)
Patient informed. She said she does not want Vivelle patch. Used it before and it did not help (although she doesn't know dose).  She said she used the Climara patch once and it helped and she would like the Climara Patch if possible.

## 2013-03-25 ENCOUNTER — Other Ambulatory Visit: Payer: Medicare Other

## 2013-05-02 ENCOUNTER — Encounter (HOSPITAL_COMMUNITY): Payer: Self-pay | Admitting: Emergency Medicine

## 2013-05-02 ENCOUNTER — Telehealth: Payer: Self-pay | Admitting: Internal Medicine

## 2013-05-02 ENCOUNTER — Emergency Department (HOSPITAL_COMMUNITY)
Admission: EM | Admit: 2013-05-02 | Discharge: 2013-05-02 | Disposition: A | Discharge status: Home / Self Care | Payer: PRIVATE HEALTH INSURANCE | Attending: Emergency Medicine | Admitting: Emergency Medicine

## 2013-05-02 ENCOUNTER — Emergency Department (HOSPITAL_COMMUNITY): Payer: PRIVATE HEALTH INSURANCE

## 2013-05-02 DIAGNOSIS — R011 Cardiac murmur, unspecified: Secondary | ICD-10-CM | POA: Insufficient documentation

## 2013-05-02 DIAGNOSIS — J45909 Unspecified asthma, uncomplicated: Secondary | ICD-10-CM | POA: Insufficient documentation

## 2013-05-02 DIAGNOSIS — Z8742 Personal history of other diseases of the female genital tract: Secondary | ICD-10-CM | POA: Insufficient documentation

## 2013-05-02 DIAGNOSIS — F329 Major depressive disorder, single episode, unspecified: Secondary | ICD-10-CM | POA: Insufficient documentation

## 2013-05-02 DIAGNOSIS — K219 Gastro-esophageal reflux disease without esophagitis: Secondary | ICD-10-CM | POA: Insufficient documentation

## 2013-05-02 DIAGNOSIS — E039 Hypothyroidism, unspecified: Secondary | ICD-10-CM | POA: Insufficient documentation

## 2013-05-02 DIAGNOSIS — I10 Essential (primary) hypertension: Secondary | ICD-10-CM | POA: Insufficient documentation

## 2013-05-02 DIAGNOSIS — E7800 Pure hypercholesterolemia, unspecified: Secondary | ICD-10-CM | POA: Insufficient documentation

## 2013-05-02 DIAGNOSIS — G43909 Migraine, unspecified, not intractable, without status migrainosus: Secondary | ICD-10-CM | POA: Insufficient documentation

## 2013-05-02 DIAGNOSIS — Z79899 Other long term (current) drug therapy: Secondary | ICD-10-CM | POA: Insufficient documentation

## 2013-05-02 DIAGNOSIS — R079 Chest pain, unspecified: Secondary | ICD-10-CM

## 2013-05-02 DIAGNOSIS — F3289 Other specified depressive episodes: Secondary | ICD-10-CM | POA: Insufficient documentation

## 2013-05-02 DIAGNOSIS — Z8739 Personal history of other diseases of the musculoskeletal system and connective tissue: Secondary | ICD-10-CM | POA: Insufficient documentation

## 2013-05-02 DIAGNOSIS — M129 Arthropathy, unspecified: Secondary | ICD-10-CM | POA: Insufficient documentation

## 2013-05-02 DIAGNOSIS — Z7982 Long term (current) use of aspirin: Secondary | ICD-10-CM | POA: Insufficient documentation

## 2013-05-02 LAB — I-STAT TROPONIN, ED
TROPONIN I, POC: 0 ng/mL (ref 0.00–0.08)
Troponin i, poc: 0 ng/mL (ref 0.00–0.08)

## 2013-05-02 LAB — BASIC METABOLIC PANEL
BUN: 14 mg/dL (ref 6–23)
CALCIUM: 9 mg/dL (ref 8.4–10.5)
CO2: 25 meq/L (ref 19–32)
CREATININE: 1.03 mg/dL (ref 0.50–1.10)
Chloride: 108 mEq/L (ref 96–112)
GFR calc Af Amer: 67 mL/min — ABNORMAL LOW (ref >90)
GFR calc non Af Amer: 58 mL/min — ABNORMAL LOW (ref >90)
Glucose, Bld: 89 mg/dL (ref 70–99)
Potassium: 3.9 mEq/L (ref 3.7–5.3)
Sodium: 145 mEq/L (ref 137–147)

## 2013-05-02 LAB — CBC
HCT: 45.9 % (ref 36.0–46.0)
Hemoglobin: 15.8 g/dL — ABNORMAL HIGH (ref 12.0–15.0)
MCH: 30 pg (ref 26.0–34.0)
MCHC: 34.4 g/dL (ref 30.0–36.0)
MCV: 87.1 fL (ref 78.0–100.0)
PLATELETS: 285 10*3/uL (ref 150–400)
RBC: 5.27 MIL/uL — AB (ref 3.87–5.11)
RDW: 14 % (ref 11.5–15.5)
WBC: 8.3 10*3/uL (ref 4.0–10.5)

## 2013-05-02 LAB — D-DIMER, QUANTITATIVE (NOT AT ARMC): D-Dimer, Quant: 0.42 ug/mL-FEU (ref 0.00–0.48)

## 2013-05-02 MED ORDER — OXYCODONE-ACETAMINOPHEN 5-325 MG PO TABS
1.0000 | ORAL_TABLET | Freq: Once | ORAL | Status: AC
  Administered 2013-05-02: 1 via ORAL
  Filled 2013-05-02: qty 1

## 2013-05-02 MED ORDER — OXYCODONE-ACETAMINOPHEN 5-325 MG PO TABS
1.0000 | ORAL_TABLET | Freq: Four times a day (QID) | ORAL | Status: DC | PRN
Start: 2013-05-02 — End: 2013-09-11

## 2013-05-02 MED ORDER — ONDANSETRON HCL 4 MG PO TABS: 4.0000 mg | ORAL_TABLET | Freq: Four times a day (QID) | ORAL | Status: DC

## 2013-05-02 MED ORDER — ONDANSETRON 4 MG PO TBDP
4.0000 mg | ORAL_TABLET | Freq: Once | ORAL | Status: AC
  Administered 2013-05-02: 4 mg via ORAL
  Filled 2013-05-02: qty 1

## 2013-05-02 MED ORDER — IBUPROFEN 800 MG PO TABS: 800.0000 mg | ORAL_TABLET | Freq: Three times a day (TID) | ORAL | Status: DC | PRN

## 2013-05-02 NOTE — ED Provider Notes (Signed)
CSN: [id number]     Arrival date & time 05/02/13  1643 History   First MD Initiated Contact with Patient 05/02/13 2019     Chief Complaint  Patient presents with  . Chest Pain     (Consider location/radiation/quality/duration/timing/severity/associated sxs/prior Treatment) HPI Patient presents to the emergency department with episodes of chest pain.  He, states she's had several episodes of this sudden, sharp, stabbing, chest pain.  This lasted for hours.  The patient, states, that nothing seems to make her condition, better, but movement makes her pain, worse.  Patient, states, that aspirin relieved her pain.  She states that she may have had a mild heart attack in the past, but has never been formally diagnosed by cardiologist with this.  Patient, states she has seen a cardiologist in the past for hyper tension and high cholesterol.  Patient denies shortness of breath, nausea, vomiting, diarrhea, headache, blurred vision, weakness, dizziness, fever, cough back pain, dysuria, or syncope.  Patient, states, that she spoke with her doctor's office, who advised her to come to the emergency department Past Medical History  Diagnosis Date  . Endometriosis   . Osteopenia   . Asthma   . Fibromyalgia   . Thyroid disease     Hypothyroid  . Elevated cholesterol   . Migraines   . Bowel obstruction   . GERD (gastroesophageal reflux disease)   . Impaired glucose tolerance 02/25/2011  . Complication of anesthesia     "I have the shakes"  . Heart murmur     mvp  . Shortness of breath     with exertion  . Arthritis   . Low back pain   . Hx of oral aphthous ulcers   . Biliary dyskinesia   . Diverticulosis   . Nocturia   . Hypothyroidism     hx nodule on thyroid   . Sleep apnea     has not used c-pap x 1 yr.   . Depression   . Cervical dysplasia   . Polycythemia vera   . BRCA negative 02/2013   Past Surgical History  Procedure Laterality Date  . Tonsillectomy    . Pelvic laparoscopy   1995,2002    DL LSO DL RSO  . Vaginal hysterectomy  1983  . Oophorectomy      LSO-BSO  . Breast surgery      Benign breast cyst  . Cholecystectomy  12/05/2011    Procedure: LAPAROSCOPIC CHOLECYSTECTOMY;  Surgeon: Ralene Ok, MD;  Location: [facility];  Service: General;  Laterality: N/A;  . Colposcopy    . Gynecologic cryosurgery     Family History  Problem Relation Age of Onset  . Hypertension Mother   . Heart disease Mother   . Breast cancer Mother     Age 60  . Cancer Mother     Bonecancer  . Hypertension Father   . Heart disease Father   . Diabetes Father   . Colon cancer Sister   . Sarcoidosis Sister   . Cancer Sister     colon  . Breast cancer Maternal Grandmother     Age 64  . Ovarian cancer Paternal Grandmother   . Breast cancer Paternal Grandmother     Age unknown  . Sarcoidosis Brother   . Breast cancer Maternal Aunt     Age 66's  . Breast cancer Maternal Aunt     Age 63   History  Substance Use Topics  . Smoking status: Never Smoker   . Smokeless  tobacco: Never Used  . Alcohol Use: 0.0 oz/week     Comment: socially   OB History   Grav Para Term Preterm Abortions TAB SAB Ect Mult Living   _0 Review of Systems  All other systems negative except as documented in the HPI. All pertinent positives and negatives as reviewed in the HPI.  Allergies  Celecoxib; Codeine; Duloxetine; Erythromycin; Escitalopram oxalate; Ezetimibe; Iohexol; Milnacipran; Pregabalin; Rofecoxib; Statins; and Tramadol  Home Medications   Current Outpatient Rx  Name  Route  Sig  Dispense  Refill  . albuterol (PROVENTIL HFA;VENTOLIN HFA) 108 (90 BASE) MCG/ACT inhaler   Inhalation   Inhale 2 puffs into the lungs every 6 (six) hours as needed for wheezing. For wheezing   3.7 g   1   . aspirin EC 81 MG EC tablet   Oral   Take 1 tablet (81 mg total) by mouth daily.   30 tablet   0   . Calcium Carbonate-Vitamin D (CALCIUM + D PO)   Oral   Take 1 tablet by  mouth daily.         . cycloSPORINE (RESTASIS) 0.05 % ophthalmic emulsion   Both Eyes   Place 1 drop into both eyes 2 (two) times daily.         Marland Kitchen dicyclomine (BENTYL) 10 MG capsule   Oral   Take 1 capsule (10 mg total) by mouth 4 (four) times daily -  before meals and at bedtime. As needed   120 capsule   5   . estradiol (CLIMARA) 0.1 mg/24hr patch   Transdermal   Place 1 patch (0.1 mg total) onto the skin once a week.   4 patch   2   . fentaNYL (DURAGESIC - DOSED MCG/HR) 25 MCG/HR   Transdermal   Place 1 patch onto the skin every 3 (three) days.         . fluticasone (FLONASE) 50 MCG/ACT nasal spray   Each Nare   Place 2 sprays into both nostrils daily.         Marland Kitchen omeprazole (PRILOSEC) 20 MG capsule   Oral   Take 20 mg by mouth daily.         Vladimir Faster Glycol-Propyl Glycol (SYSTANE) 0.4-0.3 % GEL   Ophthalmic   Apply to eye.         . polyethylene glycol powder (MIRALAX) powder   Oral   Take 17 g by mouth as needed. For constipation         . thyroid (ARMOUR) 30 MG tablet      Roma Schanz, CMA   90 tablet   3   . tiZANidine (ZANAFLEX) 4 MG tablet   Oral   Take 4 mg by mouth every 6 (six) hours as needed. For headaches          BP 162/77  Pulse 64  Temp(Src) 97.5 F (36.4 C) (Oral)  Resp 14  SpO2 100% Physical Exam  Nursing note and vitals reviewed. Constitutional: She is oriented to person, place, and time. She appears well-developed and well-nourished.  HENT:  Head: Normocephalic and atraumatic.  Mouth/Throat: Oropharynx is clear and moist.  Eyes: Pupils are equal, round, and reactive to light.  Neck: Normal range of motion. Neck supple.  Cardiovascular: Normal rate, regular rhythm and normal heart sounds.  Exam reveals no gallop and no friction rub.   No murmur heard. Pulmonary/Chest: Effort normal  and breath sounds normal. She exhibits tenderness.  Neurological: She is alert and oriented to person, place, and time. She  exhibits normal muscle tone. Coordination normal.  Skin: Skin is warm and dry. No rash noted. No erythema.    ED Course  Procedures (including critical care time) Labs Review Labs Reviewed  CBC - Abnormal; Notable for the following:    RBC 5.27 (*)    Hemoglobin 15.8 (*)    All other components within normal limits  BASIC METABOLIC PANEL - Abnormal; Notable for the following:    GFR calc non Af Amer 58 (*)    GFR calc Af Amer 67 (*)    All other components within normal limits  D-DIMER, QUANTITATIVE  I-STAT TROPOININ, ED  I-STAT TROPOININ, ED   Imaging Review Dg Chest 2 View  05/02/2013   CLINICAL DATA:  Chest pain on Monday in again today, cough, history of asthma  EXAM: CHEST  2 VIEW  COMPARISON:  DG CHEST 2 VIEW dated 05/15/2012  FINDINGS: The heart size and mediastinal contours are within normal limits. Both lungs are clear. The visualized skeletal structures are unremarkable.  IMPRESSION: No active cardiopulmonary disease.   Electronically Signed   By: Skipper Cliche M.D.   On: 05/02/2013 18:26    The patient is here for evaluation of sudden stabbing, cutting pain in her chest.  This been ongoing for the past couple of weeks.  Patient, states the pain will last from seconds to minutes.  The patient, states, that she's taken aspirin with no relief of the symptoms.  The patient is given followup with [facility] cardiology.  Told to return here as needed.  At this time it seems unlikely to be cardiac chest pain, based on the fact that she has negative enzymes, along with a negative EKG and an atypical presentation for cardiac chest pain.  Patient is advised it could still be a chance that this is related to her heart, and that's why further evaluation is warranted    Brent General, PA-C 05/07/13 0141

## 2013-05-02 NOTE — Telephone Encounter (Signed)
FYI: Patient called in wanting a referral to a cardiologist bc she was having chest pain.   I explained that could take a few days.  Patient is driving back in to [place].  Patient did not want to go to the ER.  Dr. Jenny Reichmann had a full schedule.  After speaking with him.  He stated for the patient to try the ER or possibly Urgent care.

## 2013-05-02 NOTE — ED Notes (Signed)
Pt was sent by pcp for further evaluation of cp for past week. Describes as an intermittent "cutting" pain that is relieved by aspirin. She is a&ox4, resp e/u

## 2013-05-02 NOTE — Telephone Encounter (Signed)
Patient Information:  Caller Name: Neaveh  Phone: [phone]  Patient: [name], [name]  Gender: Female  DOB: 11/20/1950  Age: 63 Years  PCP: Cathlean Cower (Adults only)  Office Follow Up:  Does the office need to follow up with this patient?: No  Instructions For The Office: N/A  RN Note:  Per disposition advised pt to call 911, pt refuded 911 and states she would like to drive back to [place] and be seen by her provider.  Once again advised pt to call 911 and at the very least have someone take her to the ED for eval.  Advised pt NOT TO DRIVE!!!!!  Pt states she will think about it.  Symptoms  Reason For Call & Symptoms: Pt states she has had 2 episodes of sharp cutting chest pain lasting at more than an hour.  One episode on 04/28/13 and another now. Pt states she is having sharp cutting like pain in the chest that almost takes her breath.  Pt also states she is in [place], [place].  Reviewed Health History In EMR: Yes  Reviewed Medications In EMR: Yes  Reviewed Allergies In EMR: Yes  Reviewed Surgeries / Procedures: Yes  Date of Onset of Symptoms: 04/28/2013  Guideline(s) Used:  Chest Pain  Disposition Per Guideline:   Call EMS 911 Now  Reason For Disposition Reached:   Chest pain lasting longer than 5 minutes and ANY of the following:  Over 34 years old Over 38 years old and at least one cardiac risk factor (i.e., high blood pressure, diabetes, high cholesterol, obesity, smoker or strong family history of heart disease) Pain is crushing, pressure-like, or heavy  Took nitroglycerin and chest pain was not relieved History of heart disease (i.e., angina, heart attack, bypass surgery, angioplasty, CHF)  Advice Given:  N/A  Patient Will Follow Care Advice:  YES

## 2013-05-02 NOTE — Discharge Instructions (Signed)
Return here as needed. Follow up with the cardiologist.

## 2013-05-07 NOTE — ED Provider Notes (Signed)
Medical screening examination/treatment/procedure(s) were performed by non-physician practitioner and as supervising physician I was immediately available for consultation/collaboration.   EKG Interpretation   Date/Time:  Friday May 02 2013 16:49:49 EDT Ventricular Rate:  65 PR Interval:  150 QRS Duration: 82 QT Interval:  388 QTC Calculation: 403 R Axis:   62 Text Interpretation:  Normal sinus rhythm Septal infarct , age  undetermined Abnormal ECG ED PHYSICIAN INTERPRETATION AVAILABLE IN [place]  [place] Confirmed by TEST, Record ([id number]) on 05/04/2013 9:01:53 AM       Threasa Beards, MD 05/07/13 [phone]

## 2013-05-08 ENCOUNTER — Encounter: Payer: Self-pay | Admitting: Internal Medicine

## 2013-05-08 ENCOUNTER — Ambulatory Visit (INDEPENDENT_AMBULATORY_CARE_PROVIDER_SITE_OTHER): Payer: 59 | Admitting: Internal Medicine

## 2013-05-08 VITALS — BP 120/78 | HR 75 | Temp 98.6°F | Ht 64.0 in | Wt 213.1 lb

## 2013-05-08 DIAGNOSIS — R079 Chest pain, unspecified: Secondary | ICD-10-CM

## 2013-05-08 DIAGNOSIS — I10 Essential (primary) hypertension: Secondary | ICD-10-CM

## 2013-05-08 DIAGNOSIS — R1012 Left upper quadrant pain: Secondary | ICD-10-CM | POA: Insufficient documentation

## 2013-05-08 MED ORDER — NABUMETONE 500 MG PO TABS: 500.0000 mg | ORAL_TABLET | Freq: Two times a day (BID) | ORAL | Status: DC | PRN

## 2013-05-08 MED ORDER — ALBUTEROL SULFATE HFA 108 (90 BASE) MCG/ACT IN AERS: 2.0000 | INHALATION_SPRAY | Freq: Four times a day (QID) | RESPIRATORY_TRACT | Status: DC | PRN

## 2013-05-08 NOTE — Assessment & Plan Note (Signed)
Exam benign, hx of polycythemia, for abd u/s - r/o splenomegaly, but suspect again [facility]

## 2013-05-08 NOTE — Assessment & Plan Note (Signed)
stable overall by history and exam, recent data reviewed with pt, and pt to continue medical treatment as before,  to f/u any worsening symptoms or concerns BP Readings from Last 3 Encounters:  05/08/13 120/78  05/02/13 114/62  02/13/13 130/78

## 2013-05-08 NOTE — Assessment & Plan Note (Signed)
C/w prob MSK, pt asks for card consult, doubt needs cath, ? Cardiac CT

## 2013-05-08 NOTE — Progress Notes (Signed)
Pre visit review using our clinic review tool, if applicable. No additional management support is needed unless otherwise documented below in the visit note. 

## 2013-05-08 NOTE — Progress Notes (Signed)
Subjective:    Patient ID: [name], female    DOB: Nov 13, 1950, 63 y.o.   MRN: [id number]  HPI  Here after 3 episodes pain since apr 6 , last 2 days ago, also seen apr 10 in ER with neg eval, a hard "cutting" pain left chest without radiation but with sob, no n/v.  Drove to [facility], took asa 81, sat in car for 1 hr, better, then back home to bed.  Stress test mar 2014 neg for ischemia.  Mentions ongoing stress, cant get away form it, felt better with a day trip to [place] where she felt more relaxed.  Next day (last Friday) was on phone with friend, pain happened again, but was able to get to [place], pt states was told she could see a heart MD by the call a nurse.  Unable to be seen went home, then event went to [facility] then ER, had cxr/labs. Neg eval, suggested there she might consider Card f/u for ? Need for cath. Tx with pain med for home, sent home. She wonders as she has the polycythemia that maybe she might have enlarged spleen as she also has some LUQ discomfort, has been reading on the internet.  Had cortisone shot per ortho to left upper medial leg, sounds like anserine bursitis. Denies worsening depressive symptoms, suicidal ideation, or panic; has ongoing anxiety, mentions CP is very scary  Past Medical History  Diagnosis Date  . Endometriosis   . Osteopenia   . Asthma   . Fibromyalgia   . Thyroid disease     Hypothyroid  . Elevated cholesterol   . Migraines   . Bowel obstruction   . GERD (gastroesophageal reflux disease)   . Impaired glucose tolerance 02/25/2011  . Complication of anesthesia     "I have the shakes"  . Heart murmur     mvp  . Shortness of breath     with exertion  . Arthritis   . Low back pain   . Hx of oral aphthous ulcers   . Biliary dyskinesia   . Diverticulosis   . Nocturia   . Hypothyroidism     hx nodule on thyroid   . Sleep apnea     has not used c-pap x 1 yr.   . Depression   . Cervical dysplasia   . Polycythemia vera   . BRCA negative 02/2013     Past Surgical History  Procedure Laterality Date  . Tonsillectomy    . Pelvic laparoscopy  1995,2002    DL LSO DL RSO  . Vaginal hysterectomy  1983  . Oophorectomy      LSO-BSO  . Breast surgery      Benign breast cyst  . Cholecystectomy  12/05/2011    Procedure: LAPAROSCOPIC CHOLECYSTECTOMY;  Surgeon: Ralene Ok, MD;  Location: [facility];  Service: General;  Laterality: N/A;  . Colposcopy    . Gynecologic cryosurgery      reports that she has never smoked. She has never used smokeless tobacco. She reports that she drinks alcohol. She reports that she does not use illicit drugs. family history includes Breast cancer in her maternal aunt, maternal aunt, maternal grandmother, mother, and paternal grandmother; Cancer in her mother and sister; Colon cancer in her sister; Diabetes in her father; Heart disease in her father and mother; Hypertension in her father and mother; Ovarian cancer in her paternal grandmother; Sarcoidosis in her brother and sister. Allergies  Allergen Reactions  . Celecoxib Nausea Only  .  Codeine     REACTION: Nausea  . Duloxetine     REACTION: n/v  . Erythromycin     REACTION: Itching  . Escitalopram Oxalate Nausea Only  . Ezetimibe   . Iohexol      Desc: VIOX, CELEBREX, EMYCIN   . Milnacipran     REACTION: n/v  . Pregabalin     REACTION: wt gain  . Rofecoxib   . Statins     Body aches  . Tramadol    Current Outpatient Prescriptions on File Prior to Visit  Medication Sig Dispense Refill  . albuterol (PROVENTIL HFA;VENTOLIN HFA) 108 (90 BASE) MCG/ACT inhaler Inhale 2 puffs into the lungs every 6 (six) hours as needed for wheezing. For wheezing  3.7 g  1  . aspirin EC 81 MG EC tablet Take 1 tablet (81 mg total) by mouth daily.  30 tablet  0  . Calcium Carbonate-Vitamin D (CALCIUM + D PO) Take 1 tablet by mouth daily.      . cycloSPORINE (RESTASIS) 0.05 % ophthalmic emulsion Place 1 drop into both eyes 2 (two) times daily.      Marland Kitchen dicyclomine  (BENTYL) 10 MG capsule Take 1 capsule (10 mg total) by mouth 4 (four) times daily -  before meals and at bedtime. As needed  120 capsule  5  . estradiol (CLIMARA) 0.1 mg/24hr patch Place 1 patch (0.1 mg total) onto the skin once a week.  4 patch  2  . fentaNYL (DURAGESIC - DOSED MCG/HR) 25 MCG/HR Place 1 patch onto the skin every 3 (three) days.      . fluticasone (FLONASE) 50 MCG/ACT nasal spray Place 2 sprays into both nostrils daily.      Marland Kitchen ibuprofen (ADVIL,MOTRIN) 800 MG tablet Take 1 tablet (800 mg total) by mouth every 8 (eight) hours as needed.  21 tablet  0  . omeprazole (PRILOSEC) 20 MG capsule Take 20 mg by mouth daily.      . ondansetron (ZOFRAN) 4 MG tablet Take 1 tablet (4 mg total) by mouth every 6 (six) hours.  12 tablet  0  . oxyCODONE-acetaminophen (PERCOCET/ROXICET) 5-325 MG per tablet Take 1 tablet by mouth every 6 (six) hours as needed for severe pain.  15 tablet  0  . Polyethyl Glycol-Propyl Glycol (SYSTANE) 0.4-0.3 % GEL Apply to eye.      . polyethylene glycol powder (MIRALAX) powder Take 17 g by mouth as needed. For constipation      . thyroid (ARMOUR) 30 MG tablet Roma Schanz, CMA  90 tablet  3  . tiZANidine (ZANAFLEX) 4 MG tablet Take 4 mg by mouth every 6 (six) hours as needed. For headaches       No current facility-administered medications on file prior to visit.    Review of Systems  Constitutional: Negative for unexpected weight change, or unusual diaphoresis  HENT: Negative for tinnitus.   Eyes: Negative for photophobia and visual disturbance.  Respiratory: Negative for choking and stridor.   Gastrointestinal: Negative for vomiting and blood in stool.  Genitourinary: Negative for hematuria and decreased urine volume.  Musculoskeletal: Negative for acute joint swelling Skin: Negative for color change and wound.  Neurological: Negative for tremors and numbness other than noted  Psychiatric/Behavioral: Negative for decreased concentration or  hyperactivity.        Objective:   Physical Exam BP 120/78  Pulse 75  Temp(Src) 98.6 F (37 C) (Oral)  Ht _0  (1.626 m)  Wt 213 lb 2 oz (96.673  kg)  BMI 36.56 kg/m2  SpO2 98% VS noted,  Constitutional: Pt appears well-developed and well-nourished.  HENT: Head: NCAT.  Right Ear: External ear normal.  Left Ear: External ear normal.  Eyes: Conjunctivae and EOM are normal. Pupils are equal, round, and reactive to light.  Neck: Normal range of motion. Neck supple.  Cardiovascular: Normal rate and regular rhythm.   Pulmonary/Chest: Effort normal and breath sounds normal.  Tender left lower chest wall +, no rahs or swelling Abd:  Soft, NT, non-distended, + BS Neurological: Pt is alert. Not confused  Skin: Skin is warm. No erythema.  Psychiatric: Pt behavior is normal. Thought content normal. 1-2+ nervous    Assessment & Plan:

## 2013-05-08 NOTE — Patient Instructions (Signed)
Ok to stop the ibuprofen  Please take all new medication as prescribed- the relafen as needed for pain (should not cause dizziness)  You will be contacted regarding the referral for: ultrasound, and cardiology consult

## 2013-05-09 ENCOUNTER — Telehealth: Payer: Self-pay | Admitting: Internal Medicine

## 2013-05-09 NOTE — Telephone Encounter (Signed)
Relevant patient education mailed to patient.  

## 2013-05-12 ENCOUNTER — Encounter: Payer: Self-pay | Admitting: Cardiovascular Disease

## 2013-05-12 ENCOUNTER — Encounter: Payer: Self-pay | Admitting: Internal Medicine

## 2013-05-12 ENCOUNTER — Ambulatory Visit (INDEPENDENT_AMBULATORY_CARE_PROVIDER_SITE_OTHER): Payer: 59 | Admitting: Cardiovascular Disease

## 2013-05-12 ENCOUNTER — Ambulatory Visit
Admission: RE | Admit: 2013-05-12 | Discharge: 2013-05-12 | Disposition: A | Discharge status: Home / Self Care | Payer: 59 | Source: Ambulatory Visit | Attending: Internal Medicine | Admitting: Internal Medicine

## 2013-05-12 VITALS — BP 110/70 | HR 76 | Ht 64.5 in | Wt 216.0 lb

## 2013-05-12 DIAGNOSIS — IMO0001 Reserved for inherently not codable concepts without codable children: Secondary | ICD-10-CM

## 2013-05-12 DIAGNOSIS — R1012 Left upper quadrant pain: Secondary | ICD-10-CM

## 2013-05-12 DIAGNOSIS — R079 Chest pain, unspecified: Secondary | ICD-10-CM

## 2013-05-12 DIAGNOSIS — Z8679 Personal history of other diseases of the circulatory system: Secondary | ICD-10-CM

## 2013-05-12 DIAGNOSIS — I10 Essential (primary) hypertension: Secondary | ICD-10-CM

## 2013-05-12 NOTE — Progress Notes (Signed)
Patient ID: [name], female   DOB: [birth year], 63 y.o.   MRN: [id number]   63 yo with ongoing atypical chest pain  Describes "cutting " sudden onset SSCP.  Not exertional has had 6-8 episodes since last year.  Takes breath away.  Has fibromyalgia but indicates this pain is more severe sudden onset and sharper.  Not positional  ASA and NSAI's don't help right away.  No true pleurisy  Denies other arthritis or connective tissue disease Pain not ppt by exertion  Has had 3 recent episodes.  Reviewed her myovue and it was normal .  No recent trauma  She is concerned about continued pains despite normal myovue    03/27/12 had normal Lexiscan myovue    ROS: Denies fever, malais, weight loss, blurry vision, decreased visual acuity, cough, sputum, SOB, hemoptysis, pleuritic pain, palpitaitons, heartburn, abdominal pain, melena, lower extremity edema, claudication, or rash.  All other systems reviewed and negative   General: Affect appropriate Healthy:  appears stated age 9: normal Neck supple with no adenopathy JVP normal no bruits no thyromegaly Lungs clear with no wheezing and good diaphragmatic motion Heart:  S1/S2 no murmur,rub, gallop or click PMI normal Abdomen: benighn, BS positve, no tenderness, no AAA no bruit.  No HSM or HJR Distal pulses intact with no bruits No edema Neuro non-focal Skin warm and dry No muscular weakness  Medications Current Outpatient Prescriptions  Medication Sig Dispense Refill  . albuterol (PROVENTIL HFA;VENTOLIN HFA) 108 (90 BASE) MCG/ACT inhaler Inhale 2 puffs into the lungs every 6 (six) hours as needed for wheezing. For wheezing  3.7 g  11  . aspirin EC 81 MG EC tablet Take 1 tablet (81 mg total) by mouth daily.  30 tablet  0  . Calcium Carbonate-Vitamin D (CALCIUM + D PO) Take 1 tablet by mouth daily.      . cycloSPORINE (RESTASIS) 0.05 % ophthalmic emulsion Place 1 drop into both eyes 2 (two) times daily.      Marland Kitchen dicyclomine (BENTYL) 10 MG  capsule Take 1 capsule (10 mg total) by mouth 4 (four) times daily -  before meals and at bedtime. As needed  120 capsule  5  . estradiol (CLIMARA) 0.1 mg/24hr patch Place 1 patch (0.1 mg total) onto the skin once a week.  4 patch  2  . fentaNYL (DURAGESIC - DOSED MCG/HR) 25 MCG/HR Place 1 patch onto the skin every 3 (three) days.      . fluticasone (FLONASE) 50 MCG/ACT nasal spray Place 2 sprays into both nostrils daily.      . nabumetone (RELAFEN) 500 MG tablet Take 1 tablet (500 mg total) by mouth 2 (two) times daily as needed.  60 tablet  2  . omeprazole (PRILOSEC) 20 MG capsule Take 20 mg by mouth daily.      . ondansetron (ZOFRAN) 4 MG tablet Take 1 tablet (4 mg total) by mouth every 6 (six) hours.  12 tablet  0  . oxyCODONE-acetaminophen (PERCOCET/ROXICET) 5-325 MG per tablet Take 1 tablet by mouth every 6 (six) hours as needed for severe pain.  15 tablet  0  . Polyethyl Glycol-Propyl Glycol (SYSTANE) 0.4-0.3 % GEL Apply to eye.      . polyethylene glycol powder (MIRALAX) powder Take 17 g by mouth as needed. For constipation      . thyroid (ARMOUR) 30 MG tablet Roma Schanz, CMA  90 tablet  3  . tiZANidine (ZANAFLEX) 4 MG tablet Take 4 mg by mouth every  6 (six) hours as needed. For headaches       No current facility-administered medications for this visit.    Allergies Celecoxib; Codeine; Duloxetine; Erythromycin; Escitalopram oxalate; Ezetimibe; Iohexol; Milnacipran; Pregabalin; Rofecoxib; Statins; and Tramadol  Family History: Family History  Problem Relation Age of Onset  . Hypertension Mother   . Heart disease Mother   . Breast cancer Mother     Age 32  . Cancer Mother     Bonecancer  . Hypertension Father   . Heart disease Father   . Diabetes Father   . Colon cancer Sister   . Sarcoidosis Sister   . Cancer Sister     colon  . Breast cancer Maternal Grandmother     Age 74  . Ovarian cancer Paternal Grandmother   . Breast cancer Paternal Grandmother     Age  unknown  . Sarcoidosis Brother   . Breast cancer Maternal Aunt     Age 55's  . Breast cancer Maternal Aunt     Age 77    Social History: History   Social History  . Marital Status: Single    Spouse Name: N/A    Number of Children: 2  . Years of Education: N/A   Occupational History  . retired    Social History Main Topics  . Smoking status: Never Smoker   . Smokeless tobacco: Never Used  . Alcohol Use: 0.0 oz/week     Comment: socially  . Drug Use: No  . Sexual Activity: No     Comment: HYST   Other Topics Concern  . Not on file   Social History Narrative  . No narrative on file    Electrocardiogram:  05/03/13  SR rate 62 normal ECG   Assessment and Plan

## 2013-05-12 NOTE — Assessment & Plan Note (Signed)
Consider adding cymbalta  May be contributing to her chest pain syndrome

## 2013-05-12 NOTE — Assessment & Plan Note (Signed)
No murmur Historical diagnosis no need for echo

## 2013-05-12 NOTE — Assessment & Plan Note (Signed)
Well controlled.  Continue current medications and low sodium Dash type diet.    

## 2013-05-12 NOTE — Patient Instructions (Addendum)
Your physician recommends that you continue on your current medications as directed. Please refer to the Current Medication list given to you today.  Your physician has requested that you have cardiac CT. Cardiac computed tomography (CT) is a painless test that uses an x-ray machine to take clear, detailed pictures of your heart. For further information please visit [email or web address]. Please follow instruction sheet as given.  Bystolic samples given to you -- please take one the night before cardiac CT testing, and one the morning of testing.  Follow up to be determined after cardiac CT testing

## 2013-05-12 NOTE — Assessment & Plan Note (Signed)
Persistent and progressive despite normal myovue  Pre test probability of disease still very low  Favor cardiac CT rather than invasive heart cath Patient willing to proceed if approved

## 2013-05-14 ENCOUNTER — Encounter: Payer: Self-pay | Admitting: Internal Medicine

## 2013-05-15 ENCOUNTER — Ambulatory Visit (INDEPENDENT_AMBULATORY_CARE_PROVIDER_SITE_OTHER): Payer: Medicare Other

## 2013-05-15 DIAGNOSIS — Z780 Asymptomatic menopausal state: Secondary | ICD-10-CM

## 2013-05-15 DIAGNOSIS — Z01419 Encounter for gynecological examination (general) (routine) without abnormal findings: Secondary | ICD-10-CM

## 2013-05-19 ENCOUNTER — Other Ambulatory Visit: Payer: Self-pay | Admitting: Gynecology

## 2013-05-19 DIAGNOSIS — Z780 Asymptomatic menopausal state: Secondary | ICD-10-CM

## 2013-05-21 ENCOUNTER — Telehealth: Payer: Self-pay | Admitting: *Deleted

## 2013-05-21 NOTE — Telephone Encounter (Signed)
PT  AWARE  WILL  FORWARD INFO TO SCHEDULERS  AS  WELL AS  BILLING   RE    THE  NEED FOR  CARDIAC  CT  .Adonis Housekeeper

## 2013-05-22 ENCOUNTER — Encounter: Payer: Self-pay | Admitting: Cardiovascular Disease

## 2013-06-10 ENCOUNTER — Ambulatory Visit (HOSPITAL_COMMUNITY)
Admission: RE | Admit: 2013-06-10 | Discharge: 2013-06-10 | Disposition: A | Discharge status: Home / Self Care | Payer: Medicare Other | Source: Ambulatory Visit | Attending: Cardiovascular Disease | Admitting: Cardiovascular Disease

## 2013-06-10 ENCOUNTER — Telehealth: Payer: Self-pay | Admitting: Cardiovascular Disease

## 2013-06-10 ENCOUNTER — Encounter (HOSPITAL_COMMUNITY): Payer: Self-pay

## 2013-06-10 DIAGNOSIS — R079 Chest pain, unspecified: Secondary | ICD-10-CM | POA: Insufficient documentation

## 2013-06-10 MED ORDER — NITROGLYCERIN 0.4 MG SL SUBL
SUBLINGUAL_TABLET | SUBLINGUAL | Status: AC
  Filled 2013-06-10: qty 1

## 2013-06-10 MED ORDER — METOPROLOL TARTRATE 1 MG/ML IV SOLN
INTRAVENOUS | Status: AC
  Filled 2013-06-10: qty 5

## 2013-06-10 MED ORDER — DIPHENHYDRAMINE HCL 50 MG/ML IJ SOLN
INTRAMUSCULAR | Status: AC
  Filled 2013-06-10: qty 1

## 2013-06-10 MED ORDER — IOHEXOL 350 MG/ML SOLN
80.0000 mL | Freq: Once | INTRAVENOUS | Status: AC | PRN
  Administered 2013-06-10: 80 mL via INTRAVENOUS

## 2013-06-10 MED ORDER — DIPHENHYDRAMINE HCL 50 MG/ML IJ SOLN
12.5000 mg | Freq: Once | INTRAMUSCULAR | Status: AC
  Administered 2013-06-10: 12.5 mg via INTRAVENOUS
  Filled 2013-06-10: qty 0.25

## 2013-06-10 NOTE — Telephone Encounter (Signed)
New message    Pt having a test at the hosp this am---can't find paper work--what time is she supposed to take her bystolic this am?

## 2013-06-10 NOTE — Telephone Encounter (Signed)
Informed patient, per OV AVS 4/07--[facility] Bystolic one tablet night before the test (which patient did last evening), and one table this am. Reviewed instruction letter 05/22/13 also. Pt verbalizes understanding and agreement.

## 2013-06-11 ENCOUNTER — Telehealth: Payer: Self-pay | Admitting: Cardiovascular Disease

## 2013-06-11 NOTE — Telephone Encounter (Signed)
PT  AWARE OF  CARDIAC  CT  RESULTS   TEST  WAS  NORMAL PER DR  NISHAN./CY

## 2013-06-11 NOTE — Telephone Encounter (Signed)
LEFT MESSAGE  TO  CALL BACK./CY 

## 2013-06-11 NOTE — Telephone Encounter (Signed)
New message     Pt states that Dr Johnsie Cancel called last night to give her the test results----she was drowsy and do not remember what he said.  Please call and give test results again

## 2013-06-12 ENCOUNTER — Other Ambulatory Visit: Payer: Self-pay | Admitting: Gynecology

## 2013-08-13 ENCOUNTER — Other Ambulatory Visit: Payer: Self-pay | Admitting: Internal Medicine

## 2013-09-11 ENCOUNTER — Ambulatory Visit (INDEPENDENT_AMBULATORY_CARE_PROVIDER_SITE_OTHER): Payer: Medicare Other | Admitting: Gynecology

## 2013-09-11 ENCOUNTER — Telehealth: Payer: Self-pay

## 2013-09-11 ENCOUNTER — Encounter: Payer: Self-pay | Admitting: Gynecology

## 2013-09-11 DIAGNOSIS — R5383 Other fatigue: Secondary | ICD-10-CM

## 2013-09-11 DIAGNOSIS — R5381 Other malaise: Secondary | ICD-10-CM

## 2013-09-11 DIAGNOSIS — N644 Mastodynia: Secondary | ICD-10-CM

## 2013-09-11 NOTE — Telephone Encounter (Signed)
Patient says she has been on HRT for many years since hysterectomy.  This morning her nipples are very sore bilaterally.  She said they "have the little bumps around them like when I would ovulate".  She says she is 63 yo and why would this be happening now.  She wonders what you recommend she can do for it.

## 2013-09-11 NOTE — Telephone Encounter (Signed)
Patient advised. Transferred to appt desk to schedule.

## 2013-09-11 NOTE — Telephone Encounter (Signed)
Office visit

## 2013-09-12 LAB — PROLACTIN: Prolactin: 7.7 ng/mL

## 2013-09-12 LAB — THYROID PANEL
Free T4: 0.87 ng/dL (ref 0.80–1.80)
T3 UPTAKE: 31.8 % (ref 22.5–37.0)
T4 TOTAL: 8.1 ug/dL (ref 5.0–12.5)
TSH: 2.681 u[IU]/mL (ref 0.350–4.500)

## 2013-09-12 NOTE — Patient Instructions (Signed)
Stop your estrogen for several days to a week, followup if you or breast tenderness continues.

## 2013-09-12 NOTE — Progress Notes (Signed)
[name] Jul 17, 1950 [id number]        62 y.o.  [id number] presents with several days of bilateral nipple discomfort. No palpable masses or nipple discharge. Notes just discomfort around her nipples.  Past medical history,surgical history, problem list, medications, allergies, family history and social history were all reviewed and documented in the EPIC chart.  Directed ROS with pertinent positives and negatives documented in the history of present illness/assessment and plan.  Exam: Kim assistant General appearance:  Normal Both breasts examined lying and sitting without masses retractions discharge adenopathy.  Assessment/Plan:  63 y.o. [id number] with bilateral nipple tenderness times several days. Mammogram mammogram 12/2012. Followup ultrasound demonstrated several small cysts in the right breast but overall benign in appearance. Given the bilateral nature I suspect this is more physiologic/hormonal and my recommendation is to stop her estrogen replacement for several days to week to see if this doesn't help to resolve her discomfort. She was also noting some fatigue recently I ordered a thyroid panel. If her discomfort continues she knows to call me and we'll pursue a more involved evaluation. If it resolves and we'll follow expectantly. The patient is comfortable with the plan and she knows to call me if it does continue.   Note: This document was prepared with digital dictation and possible smart phrase technology. Any transcriptional errors that result from this process are unintentional.   Anastasio Auerbach MD, 8:32 AM 09/12/2013

## 2013-10-29 ENCOUNTER — Encounter (HOSPITAL_COMMUNITY): Payer: Self-pay | Admitting: Emergency Medicine

## 2013-10-29 DIAGNOSIS — M1990 Unspecified osteoarthritis, unspecified site: Secondary | ICD-10-CM | POA: Diagnosis not present

## 2013-10-29 DIAGNOSIS — Z8742 Personal history of other diseases of the female genital tract: Secondary | ICD-10-CM | POA: Diagnosis not present

## 2013-10-29 DIAGNOSIS — B370 Candidal stomatitis: Secondary | ICD-10-CM | POA: Insufficient documentation

## 2013-10-29 DIAGNOSIS — I10 Essential (primary) hypertension: Secondary | ICD-10-CM | POA: Insufficient documentation

## 2013-10-29 DIAGNOSIS — Z7982 Long term (current) use of aspirin: Secondary | ICD-10-CM | POA: Diagnosis not present

## 2013-10-29 DIAGNOSIS — G43909 Migraine, unspecified, not intractable, without status migrainosus: Secondary | ICD-10-CM | POA: Diagnosis not present

## 2013-10-29 DIAGNOSIS — Z862 Personal history of diseases of the blood and blood-forming organs and certain disorders involving the immune mechanism: Secondary | ICD-10-CM | POA: Diagnosis not present

## 2013-10-29 DIAGNOSIS — K219 Gastro-esophageal reflux disease without esophagitis: Secondary | ICD-10-CM | POA: Insufficient documentation

## 2013-10-29 DIAGNOSIS — R011 Cardiac murmur, unspecified: Secondary | ICD-10-CM | POA: Insufficient documentation

## 2013-10-29 DIAGNOSIS — J45909 Unspecified asthma, uncomplicated: Secondary | ICD-10-CM | POA: Insufficient documentation

## 2013-10-29 DIAGNOSIS — E039 Hypothyroidism, unspecified: Secondary | ICD-10-CM | POA: Diagnosis not present

## 2013-10-29 DIAGNOSIS — Z79899 Other long term (current) drug therapy: Secondary | ICD-10-CM | POA: Insufficient documentation

## 2013-10-29 LAB — CBC
HCT: 47.4 % — ABNORMAL HIGH (ref 36.0–46.0)
Hemoglobin: 16.7 g/dL — ABNORMAL HIGH (ref 12.0–15.0)
MCH: 30.5 pg (ref 26.0–34.0)
MCHC: 35.2 g/dL (ref 30.0–36.0)
MCV: 86.5 fL (ref 78.0–100.0)
PLATELETS: 324 10*3/uL (ref 150–400)
RBC: 5.48 MIL/uL — AB (ref 3.87–5.11)
RDW: 13.4 % (ref 11.5–15.5)
WBC: 11.5 10*3/uL — AB (ref 4.0–10.5)

## 2013-10-29 LAB — BASIC METABOLIC PANEL
ANION GAP: 14 (ref 5–15)
BUN: 13 mg/dL (ref 6–23)
CALCIUM: 9.5 mg/dL (ref 8.4–10.5)
CO2: 25 meq/L (ref 19–32)
Chloride: 104 mEq/L (ref 96–112)
Creatinine, Ser: 0.96 mg/dL (ref 0.50–1.10)
GFR calc Af Amer: 73 mL/min — ABNORMAL LOW (ref >90)
GFR calc non Af Amer: 63 mL/min — ABNORMAL LOW (ref >90)
Glucose, Bld: 105 mg/dL — ABNORMAL HIGH (ref 70–99)
POTASSIUM: 3.8 meq/L (ref 3.7–5.3)
SODIUM: 143 meq/L (ref 137–147)

## 2013-10-29 LAB — I-STAT TROPONIN, ED: TROPONIN I, POC: 0 ng/mL (ref 0.00–0.08)

## 2013-10-29 NOTE — ED Notes (Addendum)
Pt now stating that starting a week ago she has been having intermittent cp and her left arm "locking up". Pt has been getting new medication for migraine for past month.

## 2013-10-29 NOTE — ED Notes (Signed)
Pt recently tx for bacterial infx. Since then her tongue has become white coated.

## 2013-10-30 ENCOUNTER — Ambulatory Visit (INDEPENDENT_AMBULATORY_CARE_PROVIDER_SITE_OTHER): Payer: Medicare Other | Admitting: Internal Medicine

## 2013-10-30 ENCOUNTER — Emergency Department (HOSPITAL_COMMUNITY)
Admission: EM | Admit: 2013-10-30 | Discharge: 2013-10-30 | Disposition: A | Discharge status: Home / Self Care | Payer: Medicare Other | Attending: Emergency Medicine | Admitting: Emergency Medicine

## 2013-10-30 ENCOUNTER — Encounter: Payer: Self-pay | Admitting: Internal Medicine

## 2013-10-30 VITALS — BP 114/82 | HR 83 | Temp 98.4°F | Wt 213.1 lb

## 2013-10-30 DIAGNOSIS — J438 Other emphysema: Secondary | ICD-10-CM

## 2013-10-30 DIAGNOSIS — D751 Secondary polycythemia: Secondary | ICD-10-CM

## 2013-10-30 DIAGNOSIS — B370 Candidal stomatitis: Secondary | ICD-10-CM

## 2013-10-30 DIAGNOSIS — I10 Essential (primary) hypertension: Secondary | ICD-10-CM

## 2013-10-30 MED ORDER — NYSTATIN 100000 UNIT/ML MT SUSP: 500000.0000 [IU] | Freq: Four times a day (QID) | OROMUCOSAL | Status: AC

## 2013-10-30 NOTE — Assessment & Plan Note (Addendum)
Mild but worsening again,doubt related to testosterone exposure this time, for hematology referral, also for ONO to assess nighttime hypoxia and need for restart tx for OSA, will need f/u lab next visit

## 2013-10-30 NOTE — Progress Notes (Signed)
Pre visit review using our clinic review tool, if applicable. No additional management support is needed unless otherwise documented below in the visit note. 

## 2013-10-30 NOTE — Patient Instructions (Signed)
Please continue all other medications as before, including starting the nystatin  Please have the pharmacy call with any other refills you may need.  Please keep your appointments with your specialists as you may have planned  You will be contacted regarding the referral for: Hematology (Dr Juliann Mule), and for the Overnight Oximetry to check the nighttime oxygen levels

## 2013-10-30 NOTE — Progress Notes (Signed)
Subjective:    Patient ID: [name], female    DOB: 11-Aug-1950, 63 y.o.   MRN: [id number]  HPI  Here after seen [facility] with acute pharyngiitis - tx with antibx, then seen in ER visit more recently with dx and tx for thrush, but not yet started the nystatin. Odette Horns there was c/w c-spine djd as well, reported verbally to the pt in the ER, neck pain attributed to that.   Also  - Has hx of polycythemia/erythrocytosis,, hgb elevated last night 16.7, has been creeping up recent.  Also hx of OSA, not recently treated. Was thought to be related to testosterone exposure last yr to account for erythrocytosis per Dr Chism/heme, but no exposure since that time per pt. Pt with diffuculty losing wt.  Pt denies polydipsia, polyuria,   Pt denies chest pain, increased sob or doe, wheezing, orthopnea, PND, increased LE swelling, palpitations, dizziness or syncope.  Also Increased migraine recent, with ongoing stressors, seeing Dr Miles Costain recently. Had possible sleepiness with zonisamide, now reduced to 75 mg. Does c/o ongoing fatigue as well but ? Related to the OSA, but not clear as she is not always that much trying to fall asleep.  Also with recent night sweats and diffuse myalgias c/w with her FMS in the past seems to be flared.   Past Medical History  Diagnosis Date  . Endometriosis   . Osteopenia   . Asthma   . Fibromyalgia   . Thyroid disease     Hypothyroid  . Elevated cholesterol   . Migraines   . Bowel obstruction   . GERD (gastroesophageal reflux disease)   . Impaired glucose tolerance 02/25/2011  . Complication of anesthesia     "I have the shakes"  . Heart murmur     mvp  . Shortness of breath     with exertion  . Arthritis   . Low back pain   . Hx of oral aphthous ulcers   . Biliary dyskinesia   . Diverticulosis   . Nocturia   . Hypothyroidism     hx nodule on thyroid   . Sleep apnea     has not used c-pap x 1 yr.   . Depression   . Cervical dysplasia    . Polycythemia vera   . BRCA negative 02/2013  . Hypertension    Past Surgical History  Procedure Laterality Date  . Tonsillectomy    . Pelvic laparoscopy  1995,2002    DL LSO DL RSO  . Vaginal hysterectomy  1983  . Oophorectomy      LSO-BSO  . Breast surgery      Benign breast cyst  . Cholecystectomy  12/05/2011    Procedure: LAPAROSCOPIC CHOLECYSTECTOMY;  Surgeon: Ralene Ok, MD;  Location: [facility];  Service: General;  Laterality: N/A;  . Colposcopy    . Gynecologic cryosurgery      reports that she has never smoked. She has never used smokeless tobacco. She reports that she drinks alcohol. She reports that she does not use illicit drugs. family history includes Breast cancer in her maternal aunt, maternal aunt, maternal grandmother, mother, and paternal grandmother; Cancer in her mother and sister; Colon cancer in her sister; Diabetes in her father; Heart disease in her father and mother; Hypertension in her father and mother; Ovarian cancer in her paternal grandmother; Sarcoidosis in her brother and sister. Allergies  Allergen Reactions  . Celecoxib Nausea Only and Nausea And Vomiting  . Codeine Nausea  Only    REACTION: Nausea  . Duloxetine Nausea And Vomiting    REACTION: n/v REACTION: n/v  . Erythromycin     REACTION: Itching  . Escitalopram Oxalate Nausea Only  . Ezetimibe Nausea And Vomiting  . Iohexol Nausea And Vomiting     Desc: VIOX, CELEBREX, EMYCIN  Desc: VIOX, CELEBREX, EMYCIN   . Milnacipran Nausea And Vomiting    REACTION: n/v REACTION: n/v  . Pregabalin Itching and Nausea And Vomiting    REACTION: wt gain REACTION: wt gain  . Rofecoxib Nausea And Vomiting  . Statins Nausea And Vomiting    Body aches  . Tramadol Nausea Only   Current Outpatient Prescriptions on File Prior to Visit  Medication Sig Dispense Refill  . albuterol (PROVENTIL HFA;VENTOLIN HFA) 108 (90 BASE) MCG/ACT inhaler Inhale 2 puffs into the lungs every 6 (six) hours as needed for  wheezing. For wheezing  3.7 g  11  . aspirin EC 81 MG EC tablet Take 1 tablet (81 mg total) by mouth daily.  30 tablet  0  . Calcium Carbonate-Vitamin D (CALCIUM + D PO) Take 1 tablet by mouth daily.      . cycloSPORINE (RESTASIS) 0.05 % ophthalmic emulsion Place 1 drop into both eyes 2 (two) times daily.      Marland Kitchen dicyclomine (BENTYL) 10 MG capsule Take 1 capsule (10 mg total) by mouth 4 (four) times daily -  before meals and at bedtime. As needed  120 capsule  5  . estradiol (CLIMARA - DOSED IN MG/24 HR) 0.1 mg/24hr patch Place 0.1 mg onto the skin once a week. Change on Wednedsy      . fentaNYL (DURAGESIC - DOSED MCG/HR) 25 MCG/HR Place 1 patch onto the skin every 3 (three) days.      . fluticasone (FLONASE) 50 MCG/ACT nasal spray Place 2 sprays into both nostrils daily.      . hydrochlorothiazide (HYDRODIURIL) 25 MG tablet Take 25 mg by mouth daily as needed (for edema).      . lansoprazole (PREVACID) 30 MG capsule Take 30 mg by mouth daily.      Marland Kitchen nystatin (MYCOSTATIN) 100000 UNIT/ML suspension Use as directed 5 mLs (500,000 Units total) in the mouth or throat 4 (four) times daily.  60 mL  0  . Polyethyl Glycol-Propyl Glycol (SYSTANE) 0.4-0.3 % GEL Place 1 application into both eyes daily.      Vladimir Faster Glycol-Propyl Glycol (SYSTANE) 0.4-0.3 % SOLN Place 1 drop into both eyes daily.      . polyethylene glycol powder (MIRALAX) powder Take 17 g by mouth as needed. For constipation      . thyroid (ARMOUR) 30 MG tablet Take 30 mg by mouth daily before breakfast.      . zonisamide (ZONEGRAN) 25 MG capsule Take 75 mg by mouth daily.       No current facility-administered medications on file prior to visit.    Review of Systems  Constitutional: Negative for unusual diaphoresis or other sweats  HENT: Negative for ringing in ear Eyes: Negative for double vision or worsening visual disturbance.  Respiratory: Negative for choking and stridor.   Gastrointestinal: Negative for vomiting or other  signifcant bowel change Genitourinary: Negative for hematuria or decreased urine volume.  Musculoskeletal: Negative for other MSK pain or swelling, but does have occasional left arm spasms like issue with occas numbness and some discomfort, as well as recurrent o/w unexplained RLE numbness without pain or weakness Skin: Negative for color change and  worsening wound.  Neurological: Negative for tremors and numbness other than noted  Psychiatric/Behavioral: Negative for decreased concentration or agitation other than above       Objective:   Physical Exam BP 114/82  Pulse 83  Temp(Src) 98.4 F (36.9 C) (Oral)  Wt 213 lb 2 oz (96.673 kg)  SpO2 95% VS noted, not ill appearing but fatigued, stressed demeanor Constitutional: Pt appears well-developed, well-nourished.  HENT: Head: NCAT.  Right Ear: External ear normal.  Left Ear: External ear normal.  Eyes: . Pupils are equal, round, and reactive to light. Conjunctivae and EOM are normal Neck: Normal range of motion. Neck supple.  Pharynx with mild erythema only, no exudate or enlarged tonsils; + mild tongue dorsal thrush Cardiovascular: Normal rate and regular rhythm.   Pulmonary/Chest: Effort normal and breath sounds normal.  Abd:  Soft, NT, ND, + BS Neurological: Pt is alert. Not confused , motor grossly intact Skin: Skin is warm. No rash Psychiatric: Pt behavior is normal. No agitation. 1+ nervous    Assessment & Plan:

## 2013-10-30 NOTE — Discharge Instructions (Signed)
Your Hemoglobin was 16.7.  Your hematocrit was 47.4  Thrush, Adult  Thrush, also called oral candidiasis, is a fungal infection that develops in the mouth and throat and on the tongue. It causes white patches to form on the mouth and tongue. Ritta Slot is most common in older adults, but it can occur at any age.  Many cases of thrush are mild, but this infection can also be more serious. Ritta Slot can be a recurring problem for people who have chronic illnesses or who take medicines that limit the body's ability to fight infection. Because these people have difficulty fighting infections, the fungus that causes thrush can spread throughout the body. This can cause life-threatening blood or organ infections. CAUSES  Ritta Slot is usually caused by a yeast called Candida albicans. This fungus is normally present in small amounts in the mouth and on other mucous membranes. It usually causes no harm. However, when conditions are present that allow the fungus to grow uncontrolled, it invades surrounding tissues and becomes an infection. Less often, other Candida species can also lead to thrush.  RISK FACTORS Ritta Slot is more likely to develop in the following people:  People with an impaired ability to fight infection (weakened immune system).   Older adults.   People with HIV.   People with diabetes.   People with dry mouth (xerostomia).   Pregnant women.   People with poor dental care, especially those who have false teeth.   People who use antibiotic medicines.  SIGNS AND SYMPTOMS  Ritta Slot can be a mild infection that causes no symptoms. If symptoms develop, they may include:   A burning feeling in the mouth and throat. This can occur at the start of a thrush infection.   White patches that adhere to the mouth and tongue. The tissue around the patches may be red, raw, and painful. If rubbed (during tooth brushing, for example), the patches and the tissue of the mouth may bleed easily.   A  bad taste in the mouth or difficulty tasting foods.   Cottony feeling in the mouth.   Pain during eating and swallowing. DIAGNOSIS  Your health care provider can usually diagnose thrush by looking in your mouth and asking you questions about your health.  TREATMENT  Medicines that help prevent the growth of fungi (antifungals) are the standard treatment for thrush. These medicines are either applied directly to the affected area (topical) or swallowed (oral). The treatment will depend on the severity of the condition.  Mild Thrush Mild cases of thrush may clear up with the use of an antifungal mouth rinse or lozenges. Treatment usually lasts about 14 days.  Moderate to Severe Thrush  More severe thrush infections that have spread to the esophagus are treated with an oral antifungal medicine. A topical antifungal medicine may also be used.   For some severe infections, a treatment period longer than 14 days may be needed.   Oral antifungal medicines are almost never used during pregnancy because the fetus may be harmed. However, if a pregnant woman has a rare, severe thrush infection that has spread to her blood, oral antifungal medicines may be used. In this case, the risk of harm to the mother and fetus from the severe thrush infection may be greater than the risk posed by the use of antifungal medicines.  Persistent or Recurrent Thrush For cases of thrush that do not go away or keep coming back, treatment may involve the following:   Treatment may be needed twice  as long as the symptoms last.   Treatment will include both oral and topical antifungal medicines.   People with weakened immune systems can take an antifungal medicine on a continuous basis to prevent thrush infections.  It is important to treat conditions that make you more likely to get thrush, such as diabetes or HIV.  HOME CARE INSTRUCTIONS   Only take over-the-counter or prescription medicine as directed by your  health care provider. Talk to your health care provider about an over-the-counter medicine called gentian violet, which kills bacteria and fungi.   Eat plain, unflavored yogurt as directed by your health care provider. Check the label to make sure the yogurt contains live cultures. This yogurt can help healthy bacteria grow in the mouth that can stop the growth of the fungus that causes thrush.   Try these measures to help reduce the discomfort of thrush:   Drink cold liquids such as water or iced tea.   Try flavored ice treats or frozen juices.   Eat foods that are easy to swallow, such as gelatin, ice cream, or custard.   If the patches in your mouth are painful, try drinking from a straw.   Rinse your mouth several times a day with a warm saltwater rinse. You can make the saltwater mixture with 1 tsp (6 g) of salt in 8 fl oz (0.2 L) of warm water.   If you wear dentures, remove the dentures before going to bed, brush them vigorously, and soak them in a cleaning solution as directed by your health care provider.   Women who are breastfeeding should clean their nipples with an antifungal medicine as directed by their health care provider. Dry the nipples after breastfeeding. Applying lanolin-containing body lotion may help relieve nipple soreness.  SEEK MEDICAL CARE IF:  Your symptoms are getting worse or are not improving within 7 days of starting treatment.   You have symptoms of spreading infection, such as white patches on the skin outside of the mouth.   You are nursing and you have redness, burning, or pain in the nipples that is not relieved with treatment.  MAKE SURE YOU:  Understand these instructions.  Will watch your condition.  Will get help right away if you are not doing well or get worse. Document Released: 10/05/2003 Document Revised: 10/30/2012 Document Reviewed: 08/12/2012 [facility] Patient Information 2015 [place], [place]. This information is not  intended to replace advice given to you by your health care provider. Make sure you discuss any questions you have with your health care provider.

## 2013-10-30 NOTE — ED Provider Notes (Signed)
CSN: [id number]     Arrival date & time 10/29/13  2214 History   First MD Initiated Contact with Patient 10/30/13 0239     Chief Complaint  Patient presents with  . Thrush     (Consider location/radiation/quality/duration/timing/severity/associated sxs/prior Treatment) Patient is a 63 y.o. female presenting with pharyngitis.  Sore Throat This is a new problem. Episode onset: 5 days ago. The problem occurs constantly. The problem has been gradually improving. Associated symptoms include chest pain. Pertinent negatives include no abdominal pain and no shortness of breath. Associated symptoms comments: Now has white and red on tongue. Exacerbated by: Put on antibiotics for sore throat  Nothing relieves the symptoms.    Past Medical History  Diagnosis Date  . Endometriosis   . Osteopenia   . Asthma   . Fibromyalgia   . Thyroid disease     Hypothyroid  . Elevated cholesterol   . Migraines   . Bowel obstruction   . GERD (gastroesophageal reflux disease)   . Impaired glucose tolerance 02/25/2011  . Complication of anesthesia     "I have the shakes"  . Heart murmur     mvp  . Shortness of breath     with exertion  . Arthritis   . Low back pain   . Hx of oral aphthous ulcers   . Biliary dyskinesia   . Diverticulosis   . Nocturia   . Hypothyroidism     hx nodule on thyroid   . Sleep apnea     has not used c-pap x 1 yr.   . Depression   . Cervical dysplasia   . Polycythemia vera   . BRCA negative 02/2013  . Hypertension    Past Surgical History  Procedure Laterality Date  . Tonsillectomy    . Pelvic laparoscopy  1995,2002    DL LSO DL RSO  . Vaginal hysterectomy  1983  . Oophorectomy      LSO-BSO  . Breast surgery      Benign breast cyst  . Cholecystectomy  12/05/2011    Procedure: LAPAROSCOPIC CHOLECYSTECTOMY;  Surgeon: Ralene Ok, MD;  Location: [facility];  Service: General;  Laterality: N/A;  . Colposcopy    . Gynecologic cryosurgery     Family History   Problem Relation Age of Onset  . Hypertension Mother   . Heart disease Mother   . Breast cancer Mother     Age 23  . Cancer Mother     Bonecancer  . Hypertension Father   . Heart disease Father   . Diabetes Father   . Colon cancer Sister   . Sarcoidosis Sister   . Cancer Sister     colon  . Breast cancer Maternal Grandmother     Age 52  . Ovarian cancer Paternal Grandmother   . Breast cancer Paternal Grandmother     Age unknown  . Sarcoidosis Brother   . Breast cancer Maternal Aunt     Age 61's  . Breast cancer Maternal Aunt     Age 21   History  Substance Use Topics  . Smoking status: Never Smoker   . Smokeless tobacco: Never Used  . Alcohol Use: 0.0 oz/week     Comment: socially   OB History   Grav Para Term Preterm Abortions TAB SAB Ect Mult Living   '3 2 2  1     2     '$ Review of Systems  Constitutional: Positive for fever (improving).  Respiratory: Negative for cough and  shortness of breath.   Cardiovascular: Positive for chest pain.  Gastrointestinal: Negative for nausea, vomiting, abdominal pain and diarrhea.  All other systems reviewed and are negative.     Allergies  Celecoxib; Codeine; Duloxetine; Erythromycin; Escitalopram oxalate; Ezetimibe; Iohexol; Milnacipran; Pregabalin; Rofecoxib; Statins; and Tramadol  Home Medications   Prior to Admission medications   Medication Sig Start Date End Date Taking? Authorizing Provider  albuterol (PROVENTIL HFA;VENTOLIN HFA) 108 (90 BASE) MCG/ACT inhaler Inhale 2 puffs into the lungs every 6 (six) hours as needed for wheezing. For wheezing 05/08/13  Yes Biagio Borg, MD  aspirin EC 81 MG EC tablet Take 1 tablet (81 mg total) by mouth daily. 03/28/12  Yes Reyne Dumas, MD  Calcium Carbonate-Vitamin D (CALCIUM + D PO) Take 1 tablet by mouth daily.   Yes Historical Provider, MD  cycloSPORINE (RESTASIS) 0.05 % ophthalmic emulsion Place 1 drop into both eyes 2 (two) times daily.   Yes Historical Provider, MD   dicyclomine (BENTYL) 10 MG capsule Take 1 capsule (10 mg total) by mouth 4 (four) times daily -  before meals and at bedtime. As needed 10/15/12 12/12/13 Yes Biagio Borg, MD  estradiol (CLIMARA - DOSED IN MG/24 HR) 0.1 mg/24hr patch Place 0.1 mg onto the skin once a week. Change on Wednedsy   Yes Historical Provider, MD  fluticasone (FLONASE) 50 MCG/ACT nasal spray Place 2 sprays into both nostrils daily.   Yes Historical Provider, MD  hydrochlorothiazide (HYDRODIURIL) 25 MG tablet Take 25 mg by mouth daily as needed (for edema).   Yes Historical Provider, MD  lansoprazole (PREVACID) 30 MG capsule Take 30 mg by mouth daily.   Yes Historical Provider, MD  Polyethyl Glycol-Propyl Glycol (SYSTANE) 0.4-0.3 % GEL Place 1 application into both eyes daily.   Yes Historical Provider, MD  Polyethyl Glycol-Propyl Glycol (SYSTANE) 0.4-0.3 % SOLN Place 1 drop into both eyes daily.   Yes Historical Provider, MD  polyethylene glycol powder (MIRALAX) powder Take 17 g by mouth as needed. For constipation   Yes Historical Provider, MD  thyroid (ARMOUR) 30 MG tablet Take 30 mg by mouth daily before breakfast.   Yes Historical Provider, MD  zonisamide (ZONEGRAN) 25 MG capsule Take 75 mg by mouth daily.   Yes Historical Provider, MD  fentaNYL (DURAGESIC - DOSED MCG/HR) 25 MCG/HR Place 1 patch onto the skin every 3 (three) days.    Historical Provider, MD   BP 131/74  Pulse 66  Temp(Src) 99.2 F (37.3 C) (Oral)  Resp 13  Ht $R'5\' 4"'Og$  (1.626 m)  Wt 216 lb (97.977 kg)  BMI 37.06 kg/m2  SpO2 95% Physical Exam  Nursing note and vitals reviewed. Constitutional: She is oriented to person, place, and time. She appears well-developed and well-nourished. No distress.  HENT:  Head: Normocephalic and atraumatic.  Mouth/Throat: Oropharynx is clear and moist. No tonsillar abscesses.  White plaques on tongue with associated hyperemia. No discoloration of the posterior pharynx.  Eyes: Conjunctivae are normal. Pupils are  equal, round, and reactive to light. No scleral icterus.  Neck: Neck supple.  Cardiovascular: Normal rate, regular rhythm, normal heart sounds and intact distal pulses.   No murmur heard. Pulmonary/Chest: Effort normal and breath sounds normal. No stridor. No respiratory distress. She has no rales.  Abdominal: Soft. Bowel sounds are normal. She exhibits no distension. There is no tenderness.  Musculoskeletal: Normal range of motion.  Neurological: She is alert and oriented to person, place, and time.  Normal and equal upper extremity  strength bilaterally.  Skin: Skin is warm and dry. No rash noted.  Psychiatric: She has a normal mood and affect. Her behavior is normal.    ED Course  Procedures (including critical care time) Labs Review Labs Reviewed  CBC - Abnormal; Notable for the following:    WBC 11.5 (*)    RBC 5.48 (*)    Hemoglobin 16.7 (*)    HCT 47.4 (*)    All other components within normal limits  BASIC METABOLIC PANEL - Abnormal; Notable for the following:    Glucose, Bld 105 (*)    GFR calc non Af Amer 63 (*)    GFR calc Af Amer 73 (*)    All other components within normal limits  I-STAT TROPOININ, ED    Imaging Review No results found.   EKG Interpretation   Date/Time:  Wednesday October 29 2013 22:43:11 EDT Ventricular Rate:  121 PR Interval:  144 QRS Duration: 80 QT Interval:  310 QTC Calculation: 440 R Axis:   69 Text Interpretation:  Sinus tachycardia Nonspecific T wave abnormality  Abnormal ECG compared to prior, rate increased and nonspecific t wave  changes present Confirmed by [facility]  MD, TREY ([id number]) on 10/30/2013 4:16:58  AM      MDM   Final diagnoses:  Thrush    63 year old female presenting with a chief complaint of white plaques on tongue. She has been on antibiotics since Friday.  She states she is susceptible to various yeast infections.  Exam consistent with mild thrush.  Will treat with nystatin.  No evidence of esophageal  involvement.   She also notes intermittent chest pains associated with spasms of her left arm.  None currently.  These episodes have been going on for 1.5 years.  Description of symptoms is not consistent with ACS, PE, or dissection.  Labs unremarkable.  EKG showed tachycardia, which resolved without  intervention.      Artis Delay, MD 10/31/13 2045

## 2013-10-31 NOTE — Assessment & Plan Note (Signed)
stable overall by history and exam, recent data reviewed with pt, and pt to continue medical treatment as before,  to f/u any worsening symptoms or concerns BP Readings from Last 3 Encounters:  10/30/13 114/82  10/30/13 129/78  06/10/13 93/46

## 2013-10-31 NOTE — Assessment & Plan Note (Signed)
stable overall by history and exam, recent data reviewed with pt, and pt to continue medical treatment as before,  to f/u any worsening symptoms or concerns SpO2 Readings from Last 3 Encounters:  10/30/13 95%  10/30/13 96%  06/10/13 100%

## 2013-11-03 ENCOUNTER — Telehealth: Payer: Self-pay | Admitting: Hematology and Oncology

## 2013-11-03 NOTE — Telephone Encounter (Signed)
S/W PATIENT AN GAVE NP APPT FOR 10/22 @ 10:45 W/DR. Myton

## 2013-11-03 NOTE — Telephone Encounter (Signed)
CALLED PATIENT TO GIVE NP APPT NOT ABLE TO LEAVE MESSAGE.

## 2013-11-05 ENCOUNTER — Telehealth: Payer: Self-pay | Admitting: Internal Medicine

## 2013-11-05 NOTE — Telephone Encounter (Signed)
Pt states wants results of u/s, but I dont seen where one has been ordered or done recently

## 2013-11-06 NOTE — Telephone Encounter (Signed)
Called the patient and number listed patient not available and no voice mail to leave a message.

## 2013-11-06 NOTE — Telephone Encounter (Signed)
Called the patient but number listed stated person out of area to received phone calls and could not leave a message.

## 2013-11-13 ENCOUNTER — Encounter: Payer: Self-pay | Admitting: Hematology and Oncology

## 2013-11-13 ENCOUNTER — Ambulatory Visit: Payer: Medicare Other

## 2013-11-13 ENCOUNTER — Ambulatory Visit (HOSPITAL_BASED_OUTPATIENT_CLINIC_OR_DEPARTMENT_OTHER): Payer: Medicare Other | Admitting: Hematology and Oncology

## 2013-11-13 VITALS — BP 136/63 | HR 70 | Temp 98.5°F | Resp 20 | Ht 64.0 in | Wt 217.2 lb

## 2013-11-13 DIAGNOSIS — D751 Secondary polycythemia: Secondary | ICD-10-CM

## 2013-11-13 DIAGNOSIS — G43909 Migraine, unspecified, not intractable, without status migrainosus: Secondary | ICD-10-CM | POA: Insufficient documentation

## 2013-11-13 DIAGNOSIS — G43009 Migraine without aura, not intractable, without status migrainosus: Secondary | ICD-10-CM

## 2013-11-13 DIAGNOSIS — I10 Essential (primary) hypertension: Secondary | ICD-10-CM

## 2013-11-13 DIAGNOSIS — G4733 Obstructive sleep apnea (adult) (pediatric): Secondary | ICD-10-CM

## 2013-11-13 DIAGNOSIS — E039 Hypothyroidism, unspecified: Secondary | ICD-10-CM

## 2013-11-13 DIAGNOSIS — E669 Obesity, unspecified: Secondary | ICD-10-CM

## 2013-11-13 NOTE — Assessment & Plan Note (Signed)
She had extensive evaluation last year. Peripheral blood draw JAK2 mutation was negative. The patient had diagnosis of severe obstructive sleep apnea but does not use her CPAP machine. I think this is the most likely cause of her secondary erythrocytosis. Would not recommend phlebotomy due to her symptoms of severe fatigue

## 2013-11-13 NOTE — Assessment & Plan Note (Signed)
She has not used her CPAP machine for many months. Recommend review with pulmonologist for other options if possible.

## 2013-11-13 NOTE — Progress Notes (Signed)
Checked in new patient with no financial issues prior to seeding dr. She has appt card and [place] been [place] of the [place].

## 2013-11-13 NOTE — Assessment & Plan Note (Signed)
She thought that some of her symptoms worse related to medications for her migraine headache. I suspect her headache is due to poor sleep from untreated obstructive sleep apnea.

## 2013-11-13 NOTE — Assessment & Plan Note (Signed)
She is obese and debilitated. She said that she cannot lose weight exercise due to fatigue. The most recent thyroid function tests 2 months ago were within normal limits.

## 2013-11-13 NOTE — Progress Notes (Signed)
[place] FOLLOW-UP progress notes  Patient Care Team: Biagio Borg, MD as PCP - General Janine Limbo, PA-C as Physician Assistant (Internal Medicine)  CHIEF COMPLAINTS/PURPOSE OF VISIT:  Chronic erythrocytosis  HISTORY OF PRESENTING ILLNESS:  [name] 63 y.o. female was transferred to my care after her prior physician has left.  I reviewed the patient's records extensive and collaborated the history with the patient. Summary of her history is as follows: I review her records date back to 2009 and she has chronic erythrocytosis ever since then. In the past, she has seen another hematologist. The patient had chronic untreated obstructive sleep apnea. She has a CPAP machine but she does not use it because she cannot tolerate the mask. She denies history of blood clots. She had used testosterone in the past but not recently. She has chronic fatigue and chronic migraine headaches.  MEDICAL HISTORY:  Past Medical History  Diagnosis Date  . Endometriosis   . Osteopenia   . Asthma   . Fibromyalgia   . Thyroid disease     Hypothyroid  . Elevated cholesterol   . Migraines   . Bowel obstruction   . GERD (gastroesophageal reflux disease)   . Impaired glucose tolerance 02/25/2011  . Complication of anesthesia     "I have the shakes"  . Heart murmur     mvp  . Shortness of breath     with exertion  . Arthritis   . Low back pain   . Hx of oral aphthous ulcers   . Biliary dyskinesia   . Diverticulosis   . Nocturia   . Hypothyroidism     hx nodule on thyroid   . Sleep apnea     has not used c-pap x 1 yr.   . Depression   . Cervical dysplasia   . Polycythemia vera   . BRCA negative 02/2013  . Hypertension     SURGICAL HISTORY: Past Surgical History  Procedure Laterality Date  . Tonsillectomy    . Pelvic laparoscopy  1995,2002    DL LSO DL RSO  . Vaginal hysterectomy  1983  . Oophorectomy      LSO-BSO  . Breast surgery      Benign breast cyst  .  Cholecystectomy  12/05/2011    Procedure: LAPAROSCOPIC CHOLECYSTECTOMY;  Surgeon: Ralene Ok, MD;  Location: [facility];  Service: General;  Laterality: N/A;  . Colposcopy    . Gynecologic cryosurgery      SOCIAL HISTORY: History   Social History  . Marital Status: Single    Spouse Name: N/A    Number of Children: 2  . Years of Education: N/A   Occupational History  . retired    Social History Main Topics  . Smoking status: Never Smoker   . Smokeless tobacco: Never Used  . Alcohol Use: 0.0 oz/week     Comment: socially  . Drug Use: No  . Sexual Activity: No     Comment: HYST   Other Topics Concern  . Not on file   Social History Narrative  . No narrative on file    FAMILY HISTORY: Family History  Problem Relation Age of Onset  . Hypertension Mother   . Heart disease Mother   . Breast cancer Mother     Age 24  . Cancer Mother     Bonecancer  . Hypertension Father   . Heart disease Father   . Diabetes Father   . Colon cancer Sister   .  Sarcoidosis Sister   . Cancer Sister     colon  . Breast cancer Maternal Grandmother     Age 70  . Ovarian cancer Paternal Grandmother   . Breast cancer Paternal Grandmother     Age unknown  . Sarcoidosis Brother   . Breast cancer Maternal Aunt     Age 53's  . Breast cancer Maternal Aunt     Age 25    ALLERGIES:  is allergic to celecoxib; codeine; duloxetine; erythromycin; escitalopram oxalate; ezetimibe; iohexol; milnacipran; pregabalin; rofecoxib; statins; and tramadol.  MEDICATIONS:  Current Outpatient Prescriptions  Medication Sig Dispense Refill  . albuterol (PROVENTIL HFA;VENTOLIN HFA) 108 (90 BASE) MCG/ACT inhaler Inhale 2 puffs into the lungs every 6 (six) hours as needed for wheezing. For wheezing  3.7 g  11  . amoxicillin-clavulanate (AUGMENTIN) 875-125 MG per tablet Take 875 tablets by mouth 2 (two) times daily with a meal.      . aspirin EC 81 MG EC tablet Take 1 tablet (81 mg total) by mouth daily.  30  tablet  0  . Calcium Carbonate-Vitamin D (CALCIUM + D PO) Take 1 tablet by mouth daily.      . cycloSPORINE (RESTASIS) 0.05 % ophthalmic emulsion Place 1 drop into both eyes 2 (two) times daily.      Marland Kitchen dicyclomine (BENTYL) 10 MG capsule Take 1 capsule (10 mg total) by mouth 4 (four) times daily -  before meals and at bedtime. As needed  120 capsule  5  . Diphenhyd-Hydrocort-Nystatin (FIRST-DUKES MOUTHWASH) SUSP Take 10 mLs by mouth every 3 (three) hours as needed.      Marland Kitchen estradiol (CLIMARA - DOSED IN MG/24 HR) 0.1 mg/24hr patch Place 0.1 mg onto the skin once a week. Change on Wednedsy      . fentaNYL (DURAGESIC - DOSED MCG/HR) 25 MCG/HR Place 1 patch onto the skin every 3 (three) days.      . fluticasone (FLONASE) 50 MCG/ACT nasal spray Place 2 sprays into both nostrils daily.      . hydrochlorothiazide (HYDRODIURIL) 25 MG tablet Take 25 mg by mouth daily as needed (for edema).      . lansoprazole (PREVACID) 30 MG capsule Take 30 mg by mouth daily.      . polyethylene glycol powder (MIRALAX) powder Take 17 g by mouth as needed. For constipation      . thyroid (ARMOUR) 30 MG tablet Take 30 mg by mouth daily before breakfast.      . zonisamide (ZONEGRAN) 25 MG capsule Take 75 mg by mouth daily.      Vladimir Faster Glycol-Propyl Glycol (SYSTANE) 0.4-0.3 % GEL Place 1 application into both eyes daily.       No current facility-administered medications for this visit.    REVIEW OF SYSTEMS:   Constitutional: Denies fevers, chills or abnormal night sweats Eyes: Denies blurriness of vision, double vision or watery eyes Ears, nose, mouth, throat, and face: Denies mucositis or sore throat Respiratory: Denies cough, dyspnea or wheezes Cardiovascular: Denies palpitation, chest discomfort or lower extremity swelling Gastrointestinal:  Denies nausea, heartburn or change in bowel habits Skin: Denies abnormal skin rashes Lymphatics: Denies new lymphadenopathy or easy bruising Neurological:Denies numbness,  tingling or new weaknesses Behavioral/Psych: Mood is stable, no new changes  All other systems were reviewed with the patient and are negative.  PHYSICAL EXAMINATION: ECOG PERFORMANCE STATUS: 1 - Symptomatic but completely ambulatory  Filed Vitals:   11/13/13 1118  BP: 136/63  Pulse: 70  Temp: 98.5  F (36.9 C)  Resp: 20   Filed Weights   11/13/13 1118  Weight: 217 lb 3.2 oz (98.521 kg)    GENERAL:alert, no distress and comfortable. She is morbidly obese SKIN: skin color, texture, turgor are normal, no rashes or significant lesions EYES: normal, conjunctiva are pink and non-injected, sclera clear OROPHARYNX:no exudate, normal lips, buccal mucosa, and tongue  NECK: supple, thyroid normal size, non-tender, without nodularity LYMPH:  no palpable lymphadenopathy in the cervical, axillary or inguinal LUNGS: clear to auscultation and percussion with normal breathing effort HEART: regular rate & rhythm and no murmurs without lower extremity edema ABDOMEN:abdomen soft, non-tender and normal bowel sounds Musculoskeletal:no cyanosis of digits and no clubbing  PSYCH: alert & oriented x 3 with fluent speech NEURO: no focal motor/sensory deficits  LABORATORY DATA:  I have reviewed the data as listed Lab Results  Component Value Date   WBC 11.5* 10/29/2013   HGB 16.7* 10/29/2013   HCT 47.4* 10/29/2013   MCV 86.5 10/29/2013   PLT 324 10/29/2013    Recent Labs  05/02/13 1654 10/29/13 2259  NA 145 143  K 3.9 3.8  CL 108 104  CO2 25 25  GLUCOSE 89 105*  BUN 14 13  CREATININE 1.03 0.96  CALCIUM 9.0 9.5  GFRNONAA 58* 63*  GFRAA 67* 73*   ASSESSMENT & PLAN:  Polycythemia, secondary She had extensive evaluation last year. Peripheral blood draw JAK2 mutation was negative. The patient had diagnosis of severe obstructive sleep apnea but does not use her CPAP machine. I think this is the most likely cause of her secondary erythrocytosis. Would not recommend phlebotomy due to her  symptoms of severe fatigue  OSA (obstructive sleep apnea) She has not used her CPAP machine for many months. Recommend review with pulmonologist for other options if possible.  Migraine headache She thought that some of her symptoms worse related to medications for her migraine headache. I suspect her headache is due to poor sleep from untreated obstructive sleep apnea.  Obesity She is obese and debilitated. She said that she cannot lose weight exercise due to fatigue. The most recent thyroid function tests 2 months ago were within normal limits.  Overall, the patient is looking for a definitive hematology diagnosis and is hoping that they maybe a pill available to treat all her problems. Unfortunately, I cannot help her. I recommend she start by using her CPAP machine and discusse with her pulmonologist for other options to get her sleep apnea treated. If his sleep apnea is well treated, she may have had less fatigue, able to exercise and hopefully lose some weight. Her secondary erythrocytosis might get better and her migraine headaches should go away as well.  Orders Placed This Encounter  Procedures  . CBC & Diff and Retic    Standing Status: Future     Number of Occurrences:      Standing Expiration Date: 12/18/2014    All questions were answered. The patient knows to call the clinic with any problems, questions or concerns. I spent 30 minutes counseling the patient face to face. The total time spent in the appointment was 40 minutes and more than 50% was on counseling.     [facility], [place], MD 11/13/2013 8:45 PM

## 2013-11-14 ENCOUNTER — Telehealth: Payer: Self-pay | Admitting: Hematology and Oncology

## 2013-11-14 NOTE — Telephone Encounter (Signed)
s.w pt and advised on April 2016 appt....pt ok adn aware

## 2013-11-19 ENCOUNTER — Telehealth: Payer: Self-pay | Admitting: Pulmonary Disease

## 2013-11-19 NOTE — Telephone Encounter (Signed)
Appt made with [facility] on 11/28/13 at 12pm. Pt verbalized understanding and denied any further questions or concerns at this time.

## 2013-11-19 NOTE — Telephone Encounter (Signed)
If she wishes to try and get back on cpap, will need an ov to review the issues.

## 2013-11-19 NOTE — Telephone Encounter (Signed)
Pt last seen 10/2012 with [place] -- Pt requesting new mask or new CPAP, DME APS Pt has not been using d/t not being able to tolerate. Pt states that she has to get up a lot during the night to go to the bathroom and stopped using CPAP d/t inconvenience of having to take mask on and off repetitively through out the night. The patient was advised to start back using CPAP by Dr Alvy Bimler.  Pt states that Dr Alvy Bimler has recommended this be followed up on as it can be contributing to her issues with her Polycythemia??  Advised the patient she will probably need an OV to discuss with [facility] but that per her request we will send this message first.  Please advise Dr Gwenette Greet. Thanks.

## 2013-11-24 ENCOUNTER — Encounter: Payer: Self-pay | Admitting: Hematology and Oncology

## 2013-11-28 ENCOUNTER — Ambulatory Visit (INDEPENDENT_AMBULATORY_CARE_PROVIDER_SITE_OTHER): Payer: Medicare Other | Admitting: Pulmonary Disease

## 2013-11-28 ENCOUNTER — Encounter: Payer: Self-pay | Admitting: Pulmonary Disease

## 2013-11-28 ENCOUNTER — Telehealth: Payer: Self-pay | Admitting: Internal Medicine

## 2013-11-28 VITALS — BP 118/70 | HR 79 | Temp 97.4°F | Ht 64.0 in | Wt 221.4 lb

## 2013-11-28 DIAGNOSIS — G4733 Obstructive sleep apnea (adult) (pediatric): Secondary | ICD-10-CM

## 2013-11-28 NOTE — Progress Notes (Signed)
   Subjective:    Patient ID: [name], female    DOB: 1951-01-20, 63 y.o.   MRN: [id number]  HPI The patient comes in today for follow-up of her known obstructive sleep apnea. I have not seen her in over a year, and she has not been able to tolerate C Pap on a regular basis for many reasons. She feels this is not a viable therapy for her, and would like to consider other options. She also has a history of polycythemia, and the question has been raised whether she may simply need nocturnal oxygen. She admits that she sleeps poorly, and has significant daytime sleepiness.   Review of Systems  Constitutional: Negative for fever and unexpected weight change.  HENT: Positive for postnasal drip, trouble swallowing and voice change. Negative for congestion, dental problem, ear pain, nosebleeds, rhinorrhea, sinus pressure, sneezing and sore throat.   Eyes: Negative for redness and itching.  Respiratory: Negative for cough, chest tightness, shortness of breath and wheezing.   Cardiovascular: Negative for palpitations and leg swelling.  Gastrointestinal: Negative for nausea and vomiting.  Genitourinary: Negative for dysuria.  Musculoskeletal: Negative for joint swelling.  Skin: Negative for rash.  Neurological: Positive for headaches.  Hematological: Does not bruise/bleed easily.  Psychiatric/Behavioral: Negative for dysphoric mood. The patient is not nervous/anxious.        Objective:   Physical Exam Obese female in no acute distress Nose without purulence or discharge noted No skin breakdown or pressure necrosis from the C Pap mask Neck without lymphadenopathy or thyromegaly Lower extremities with mild edema, no cyanosis Alert and oriented, moves all 4 extremities.       Assessment & Plan:

## 2013-11-28 NOTE — Patient Instructions (Signed)
Think about treating your sleep apnea with a dental appliance while working on weight loss.  I will be happy to make the referral. If you are not going to try the dental appliance, you will need to start on oxygen for your falling oxygen levels at night.  However, understand that oxygen will not treat your sleep apnea nor make you feel more rested the next day. Please call me next week once you have made your decision.

## 2013-11-28 NOTE — Telephone Encounter (Signed)
Very sorry but office policy is such that OV is needed if there might be need for antibx, to reduce chance of future antibiotic resistance due to overuse  Please consider OV - sat clinic

## 2013-11-28 NOTE — Telephone Encounter (Signed)
Patient states she last seen Dr. Jenny Reichmann for a bacterial infection in throat.  She states she felt better but she believes it has came back.  She would like to know if Dr. Jenny Reichmann could call something in for this.

## 2013-11-28 NOTE — Assessment & Plan Note (Signed)
The patient has not been able to wear C Pap for multiple reasons, and she feels this is not a viable therapy for her. Because she has moderate disease, I suspect that she would have a reasonable response to a dental appliance, and I have asked her to think about this. Because of her polycythemia, if she decides to not treat her sleep apnea aggressively, we could start her on nocturnal oxygen. However, I made sure that she understands this will not treat her sleep apnea nor improve her daytime alertness. The patient would like to take some time to think about this, and also to check with her insurance to see what may be covered. She will get back with me next week. I have also encouraged her to work aggressively on weight loss, which would ultimately resolve her sleep apnea.

## 2013-11-28 NOTE — Telephone Encounter (Signed)
We do not have anything open today or tomorrow.  I told patient I could look at another office for this evening or schedule her with Dr. Linna Darner for Monday.  She is going to try to go to a Urgent Care.

## 2013-11-28 NOTE — Telephone Encounter (Signed)
Informed pt of MD response. Transferred to [facility]

## 2013-12-05 ENCOUNTER — Telehealth: Payer: Self-pay | Admitting: Pulmonary Disease

## 2013-12-05 DIAGNOSIS — G4733 Obstructive sleep apnea (adult) (pediatric): Secondary | ICD-10-CM

## 2013-12-05 NOTE — Telephone Encounter (Signed)
Pt calling to let Dr Gwenette Greet know that she wishes to proceed with the Dental Appliance. Does not wish to try CPAP at this time.  Please advise. Thanks.

## 2013-12-08 NOTE — Telephone Encounter (Signed)
Ok to send referral to orthodontics, specifically Oneal Grout.

## 2013-12-08 NOTE — Telephone Encounter (Signed)
Referral placed.  Pt aware and voiced no further questions or concerns at this time.

## 2013-12-12 ENCOUNTER — Telehealth: Payer: Self-pay | Admitting: Pulmonary Disease

## 2013-12-12 DIAGNOSIS — G4733 Obstructive sleep apnea (adult) (pediatric): Secondary | ICD-10-CM

## 2013-12-12 NOTE — Telephone Encounter (Signed)
Referral Notes     Type Date User   Provider Comments 12/08/2013 3:16 PM Raymondo Band D        Summary   Provider Comments        Note   Pt needs referral to Dr. Oneal Grout for dental appliance. Thank you.         Will need to forward to [facility] to get update on where this order stands. Will update patient once we know. Pt is aware.   PCC's please advise. Thanks.

## 2013-12-15 ENCOUNTER — Telehealth: Payer: Self-pay | Admitting: *Deleted

## 2013-12-15 NOTE — Telephone Encounter (Signed)
Pt received a letter from insurance company stating climara patch 0.1 mg is no longer covered. Pt doesn't want to try a estrace cream which is listed on covered list. She asked if you are aware of any new medication to help with menopausal symptoms? Please advise

## 2013-12-15 NOTE — Telephone Encounter (Signed)
She can try generic estradiol patch which should be cheaper or other patches like Vivelle. There are oral estrogen pills as well as nonhormonal options like Effexor which is an antidepressant but has a crossover effect as far as helping with hot flashes and night sweats.  She could make an appointment we did talk about various options if she would like or she could just try the estradiol patch equivalent dose to her Climara

## 2013-12-16 NOTE — Telephone Encounter (Signed)
Pt phone said she is unaviable.

## 2013-12-17 NOTE — Telephone Encounter (Signed)
Pt needs appt with Dr Ron Parker for oral appliance.  New referral placed.  Forwarding to PCC's as pt was seen by [facility] 3 weeks ago and pt still has not heard anything about this referral.  Thank you.

## 2013-12-17 NOTE — Telephone Encounter (Signed)
Pt is calling back-pt has not heard anything regarding this referral.  Pt is requesting this be taken care of asap.  [name]

## 2013-12-22 MED ORDER — ESTRADIOL ACETATE 0.1 MG/24HR VA RING: VAGINAL_RING | VAGINAL | Status: DC

## 2013-12-22 NOTE — Telephone Encounter (Signed)
Pt informed with the below note and asked she could have a femring Rx? Pt said she tried the Vivelle patch and that did help as well, and she doesn't want to take pills by mouth due to all the other medication she takes. Please advise

## 2013-12-22 NOTE — Telephone Encounter (Signed)
Femring 0.1 mg okay #1

## 2013-12-22 NOTE — Telephone Encounter (Signed)
rx sent

## 2013-12-27 ENCOUNTER — Ambulatory Visit (INDEPENDENT_AMBULATORY_CARE_PROVIDER_SITE_OTHER): Payer: Medicare Other | Admitting: Family Medicine

## 2013-12-27 ENCOUNTER — Encounter: Payer: Self-pay | Admitting: Family Medicine

## 2013-12-27 VITALS — BP 110/90 | Temp 97.8°F | Wt 220.0 lb

## 2013-12-27 DIAGNOSIS — M546 Pain in thoracic spine: Secondary | ICD-10-CM

## 2013-12-27 DIAGNOSIS — B370 Candidal stomatitis: Secondary | ICD-10-CM

## 2013-12-27 DIAGNOSIS — R109 Unspecified abdominal pain: Secondary | ICD-10-CM

## 2013-12-27 MED ORDER — LIDOCAINE HCL (PF) 1 % IJ SOLN
0.5000 mL | Freq: Once | INTRAMUSCULAR | Status: AC
  Administered 2013-12-27: 0.5 mL

## 2013-12-27 MED ORDER — METHYLPREDNISOLONE ACETATE 40 MG/ML IJ SUSP
20.0000 mg | Freq: Once | INTRAMUSCULAR | Status: AC
  Administered 2013-12-27: 20 mg via INTRAMUSCULAR

## 2013-12-27 MED ORDER — NYSTATIN 100000 UNIT/ML MT SUSP: 5.0000 mL | Freq: Four times a day (QID) | OROMUCOSAL | Status: DC

## 2013-12-27 NOTE — Progress Notes (Signed)
OFFICE NOTE  12/27/2013  CC:  Chief Complaint  Patient presents with  . back pain    pt c/o right sided flank/back pain along with weight gain   HPI: Patient is a 63 y.o. African-American female who is here for "recurrent thrush"--says she had this a couple of times in last few months due to being on antibiotics multiple times the last few months for throat infections. Also, 1 wk hx of pain in R mid back/flank region constantly, occ radiates some around right side toward abdomen.  No hematuria noted.  No new nausea: she has some chronic waxing/waning nausea.  No known fever but says she gets hot at night when trying to sleep.  No rash seen.   No SOB.  No recent trauma/injury. Pt deals with chronic fibromyalgia pain + has hx of low back/DDD pain and says the current pain does not feel like either of these.  Pertinent PMH:  PMH and PSH reviewed today.  MEDS:  Outpatient Prescriptions Prior to Visit  Medication Sig Dispense Refill  . aspirin EC 81 MG EC tablet Take 1 tablet (81 mg total) by mouth daily. 30 tablet 0  . Calcium Carbonate-Vitamin D (CALCIUM + D PO) Take 1 tablet by mouth daily.    Marland Kitchen estradiol (CLIMARA - DOSED IN MG/24 HR) 0.1 mg/24hr patch Place 0.1 mg onto the skin once a week. Change on Wednedsy    . Estradiol Acetate 0.1 MG/24HR RING Place 0.1 each vaginally every 3 (three) months 0.1 each 0  . fentaNYL (DURAGESIC - DOSED MCG/HR) 25 MCG/HR Place 1 patch onto the skin every 3 (three) days.    . fluticasone (FLONASE) 50 MCG/ACT nasal spray Place 2 sprays into both nostrils as needed.     . hydrochlorothiazide (HYDRODIURIL) 25 MG tablet Take 25 mg by mouth daily as needed (for edema).    . lansoprazole (PREVACID) 30 MG capsule Take 30 mg by mouth daily.    Vladimir Faster Glycol-Propyl Glycol (SYSTANE) 0.4-0.3 % GEL Place 1 application into both eyes daily.    Marland Kitchen thyroid (ARMOUR) 30 MG tablet Take 30 mg by mouth daily before breakfast.    . zonisamide (ZONEGRAN) 25 MG capsule  Take 75 mg by mouth daily.    Marland Kitchen albuterol (PROVENTIL HFA;VENTOLIN HFA) 108 (90 BASE) MCG/ACT inhaler Inhale 2 puffs into the lungs every 6 (six) hours as needed for wheezing. For wheezing (Patient not taking: Reported on 12/27/2013) 3.7 g 11  . cycloSPORINE (RESTASIS) 0.05 % ophthalmic emulsion Place 1 drop into both eyes 2 (two) times daily.    Marland Kitchen dicyclomine (BENTYL) 10 MG capsule Take 1 capsule (10 mg total) by mouth 4 (four) times daily -  before meals and at bedtime. As needed 120 capsule 5  . polyethylene glycol powder (MIRALAX) powder Take 17 g by mouth as needed. For constipation     No facility-administered medications prior to visit.    PE: Blood pressure 110/90, temperature 97.8 F (36.6 C), weight 220 lb (99.791 kg). Gen: Alert, well appearing.  Patient is oriented to person, place, time, and situation. ENT: Ears: EACs clear, normal epithelium.  TMs with good light reflex and landmarks bilaterally.  Eyes: no injection, icteris, swelling, or exudate.  EOMI, PERRLA. Nose: no drainage or turbinate edema/swelling.  No injection or focal lesion.  Mouth: lips without lesion/swelling.  Oral mucosa pink and moist.  Tongue has thin film of dull yellowish substance that does no scrape off with tongue depressor.   Dentition intact and  without obvious caries or gingival swelling.  Oropharynx without erythema, exudate, or swelling.  Neck - No masses or thyromegaly or limitation in range of motion CV: RRR, no m/r/g.   LUNGS: CTA bilat, nonlabored resps, good aeration in all lung fields. BACK: mild diffuse TTP in right posterolateral thoracic wall, with 2 trigger points palpable about 4 inches apart.   LAB:  CC UA today: normal  IMPRESSION AND PLAN:  1) Right posterior thoracic wall pain with 2 distinct myofascial trigger points. Gave pt option of injection of these today with steroid and she chose this. A trigger point injection was performed at the 2 distinct sites of maximal tenderness  (about 3 inches apart) using 1/2 cc of 1% plain Lidocaine and 1/2 cc of 40mg /ml depo medrol. This was well tolerated, no immediate complications.  2) Thrush: nystatin suspension, 1 tsp qid swish and swallow x 14d.  Told pt to avoid using hard candy with sugar in it to try to help with her dry mouth, as this is likely making the thrush recur/thrive.  Spent 25 min with pt today, with >50% of this time spent in counseling and care coordination regarding the above problems.  An After Visit Summary was printed and given to the patient.  FOLLOW UP: prn

## 2013-12-30 ENCOUNTER — Telehealth: Payer: Self-pay | Admitting: Internal Medicine

## 2013-12-30 ENCOUNTER — Encounter: Payer: Self-pay | Admitting: Family Medicine

## 2013-12-30 ENCOUNTER — Ambulatory Visit (INDEPENDENT_AMBULATORY_CARE_PROVIDER_SITE_OTHER): Payer: Medicare Other | Admitting: Family Medicine

## 2013-12-30 VITALS — BP 130/90 | Temp 98.6°F | Wt 216.0 lb

## 2013-12-30 DIAGNOSIS — M546 Pain in thoracic spine: Secondary | ICD-10-CM | POA: Insufficient documentation

## 2013-12-30 DIAGNOSIS — B370 Candidal stomatitis: Secondary | ICD-10-CM | POA: Insufficient documentation

## 2013-12-30 DIAGNOSIS — B029 Zoster without complications: Secondary | ICD-10-CM | POA: Insufficient documentation

## 2013-12-30 MED ORDER — VALACYCLOVIR HCL 1 G PO TABS: ORAL_TABLET | ORAL | Status: DC

## 2013-12-30 NOTE — Telephone Encounter (Signed)
Patient Information:  Caller Name: Helene Kelp  Phone: [phone]  Patient: Margaret, Parker  Gender: Female  DOB: [birth year]  Age: 63 Years  PCP: Cathlean Cower (Adults only)  Office Follow Up:  Does the office need to follow up with this patient?: No  Instructions For The Office: N/A   Symptoms  Reason For Call & Symptoms: 12/27/13 pt seen in office due to side/back pain by Dr Anitra Lauth.   Was thought to have possible shingles or Fibromyalgia crisis.  Was given two injections of Depomedrol in office and two bandaids applied.   12/28/13 began itching at bandaid locations.   12/29/13 removed bandaids due ot continued itching.   Pt had red bumps, raised, itchy.  12/30/13 itchy bumps continue and are in a band about 5-6 inches long, afebrile.  The side/back pain is not as severe as previous, but still present.  Rates 6/10.  Reviewed Health History In EMR: Yes  Reviewed Medications In EMR: Yes  Reviewed Allergies In EMR: Yes  Reviewed Surgeries / Procedures: Yes  Date of Onset of Symptoms: 12/27/2013  Guideline(s) Used:  Rash or Redness - Localized  Disposition Per Guideline:   See Today in Office  Reason For Disposition Reached:   Painful rash and has multiple small blisters grouped together in one area of body (i.e., dermatomal distribution or "band" or "stripe")  Advice Given:  Apply Cold to the Area:  Apply or soak in cold water for 20 minutes every 3 to 4 hours to reduce itching or pain.  Apply Cold to the Area:  Apply or soak in cold water for 20 minutes every 3 to 4 hours to reduce itching or pain.  Hydrocortisone Cream for Itching:   Keep the cream in the refrigerator (Reason: it feels better if applied cold).  Patient Will Follow Care Advice:  YES  Appointment Scheduled:  12/30/2013 10:00:00 Appointment Scheduled Provider:  Other

## 2013-12-30 NOTE — Progress Notes (Signed)
Pre visit review using our clinic review tool, if applicable. No additional management support is needed unless otherwise documented below in the visit note. 

## 2013-12-30 NOTE — Telephone Encounter (Signed)
FYI

## 2013-12-30 NOTE — Progress Notes (Signed)
   Subjective:    Patient ID: [name], female    DOB: 04/14/1950, 63 y.o.   MRN: [id number]  HPI Helene Kelp is a 63 year old female patient of Dr. Jenny Reichmann who was referred to [facility] for evaluation of a skin rash  She was seen in the [facility] with acute back pain and tells me she was given 2 shots of steroids. Yesterday she broke out in a rash on her back this very pleuritic   Review of Systems Review of systems otherwise negative except for multiple medical problems as well outlined    Objective:   Physical Exam  Well-developed well-nourished female no acute distress vital signs stable she's afebrile examination skin shows a vesicular rash in the thorax consistent with shingles      Assessment & Plan:  Shingles.........Marland Kitchen Acyclovir,,,,,,,, close follow-up by Dr. Jenny Reichmann

## 2013-12-30 NOTE — Patient Instructions (Signed)
Valtrex 1 g.......Marland Kitchen 1 tablet 3 times daily  Make a follow-up appointment with your doctor on Thursday or Friday of this week for follow-up

## 2014-01-01 ENCOUNTER — Telehealth: Payer: Self-pay | Admitting: Internal Medicine

## 2014-01-01 ENCOUNTER — Encounter: Payer: Self-pay | Admitting: Internal Medicine

## 2014-01-01 ENCOUNTER — Ambulatory Visit (INDEPENDENT_AMBULATORY_CARE_PROVIDER_SITE_OTHER): Payer: Medicare Other | Admitting: Internal Medicine

## 2014-01-01 VITALS — BP 133/88 | HR 67 | Temp 98.5°F | Ht 64.0 in | Wt 219.0 lb

## 2014-01-01 DIAGNOSIS — F329 Major depressive disorder, single episode, unspecified: Secondary | ICD-10-CM

## 2014-01-01 DIAGNOSIS — J438 Other emphysema: Secondary | ICD-10-CM

## 2014-01-01 DIAGNOSIS — Z0189 Encounter for other specified special examinations: Secondary | ICD-10-CM

## 2014-01-01 DIAGNOSIS — B029 Zoster without complications: Secondary | ICD-10-CM

## 2014-01-01 DIAGNOSIS — Z0000 Encounter for general adult medical examination without abnormal findings: Secondary | ICD-10-CM

## 2014-01-01 DIAGNOSIS — F32A Depression, unspecified: Secondary | ICD-10-CM

## 2014-01-01 MED ORDER — HYDROCODONE-ACETAMINOPHEN 5-325 MG PO TABS: 1.0000 | ORAL_TABLET | Freq: Four times a day (QID) | ORAL | Status: DC | PRN

## 2014-01-01 NOTE — Patient Instructions (Addendum)
Please take all new medication as prescribed - the pain medication  Please continue all other medications as before, including the valtrex (generic)  Please have the pharmacy call with any other refills you may need.  Please keep your appointments with your specialists as you may have planned  You may wish to make an appointment with Dr Smith/sport medicine if the right leg pain persists  Please return in 3 months, or sooner if needed, with Lab testing done 3-5 days before

## 2014-01-01 NOTE — Progress Notes (Signed)
Subjective:    Patient ID: [name], female    DOB: 1950-03-09, 62 y.o.   MRN: [id number]  HPI   Here to f/u rash, dx with new onset right lower back rash c/w shingles. Pain still 8/10 .  Also with intermittent right ant thigh burning pain as well but started several months ago with no rash. Pt denies chest pain, increased sob or doe, wheezing, orthopnea, PND, increased LE swelling, palpitations, dizziness or syncope.  Pt denies new neurological symptoms such as new headache, or facial or extremity weakness.   Pt denies polydipsia, polyuria,.    Had recent polycythemia, asked to optimize the OSA tx for mo and re-check.  Denies worsening depressive symptoms, suicidal ideation, or panic Past Medical History  Diagnosis Date  . Endometriosis   . Osteopenia   . Asthma   . Fibromyalgia   . Thyroid disease     Hypothyroid  . Elevated cholesterol   . Migraines   . Bowel obstruction   . GERD (gastroesophageal reflux disease)   . Impaired glucose tolerance 02/25/2011  . Complication of anesthesia     "I have the shakes"  . Heart murmur     mvp  . Shortness of breath     with exertion  . Arthritis   . Low back pain   . Hx of oral aphthous ulcers   . Biliary dyskinesia   . Diverticulosis   . Nocturia   . Hypothyroidism     hx nodule on thyroid   . Sleep apnea     has not used c-pap x 1 yr.   . Depression   . Cervical dysplasia   . Polycythemia vera   . BRCA negative 02/2013  . Hypertension    Past Surgical History  Procedure Laterality Date  . Tonsillectomy    . Pelvic laparoscopy  1995,2002    DL LSO DL RSO  . Vaginal hysterectomy  1983  . Oophorectomy      LSO-BSO  . Breast surgery      Benign breast cyst  . Cholecystectomy  12/05/2011    Procedure: LAPAROSCOPIC CHOLECYSTECTOMY;  Surgeon: Ralene Ok, MD;  Location: [facility];  Service: General;  Laterality: N/A;  . Colposcopy    . Gynecologic cryosurgery      reports that she has never smoked. She has never used  smokeless tobacco. She reports that she drinks alcohol. She reports that she does not use illicit drugs. family history includes Breast cancer in her maternal aunt, maternal aunt, maternal grandmother, mother, and paternal grandmother; Cancer in her mother and sister; Colon cancer in her sister; Diabetes in her father; Heart disease in her father and mother; Hypertension in her father and mother; Ovarian cancer in her paternal grandmother; Sarcoidosis in her brother and sister. Allergies  Allergen Reactions  . Celecoxib Nausea Only and Nausea And Vomiting  . Codeine Nausea Only    REACTION: Nausea  . Duloxetine Nausea And Vomiting    REACTION: n/v REACTION: n/v  . Erythromycin     REACTION: Itching  . Escitalopram Oxalate Nausea Only  . Ezetimibe Nausea And Vomiting  . Iohexol Nausea And Vomiting     Desc: VIOX, CELEBREX, EMYCIN  Desc: VIOX, CELEBREX, EMYCIN   . Milnacipran Nausea And Vomiting    REACTION: n/v REACTION: n/v  . Pregabalin Itching and Nausea And Vomiting    REACTION: wt gain REACTION: wt gain  . Rofecoxib Nausea And Vomiting  . Statins Nausea And Vomiting  Body aches  . Tramadol Nausea Only   Current Outpatient Prescriptions on File Prior to Visit  Medication Sig Dispense Refill  . albuterol (PROVENTIL HFA;VENTOLIN HFA) 108 (90 BASE) MCG/ACT inhaler Inhale 2 puffs into the lungs every 6 (six) hours as needed for wheezing. For wheezing 3.7 g 11  . aspirin EC 81 MG EC tablet Take 1 tablet (81 mg total) by mouth daily. 30 tablet 0  . Calcium Carbonate-Vitamin D (CALCIUM + D PO) Take 1 tablet by mouth daily.    . cycloSPORINE (RESTASIS) 0.05 % ophthalmic emulsion Place 1 drop into both eyes 2 (two) times daily.    . Estradiol Acetate 0.1 MG/24HR RING Place 0.1 each vaginally every 3 (three) months 0.1 each 0  . fentaNYL (DURAGESIC - DOSED MCG/HR) 25 MCG/HR Place 1 patch onto the skin every 3 (three) days.    . fluticasone (FLONASE) 50 MCG/ACT nasal spray Place 2  sprays into both nostrils as needed.     . hydrochlorothiazide (HYDRODIURIL) 25 MG tablet Take 25 mg by mouth daily as needed (for edema).    . lansoprazole (PREVACID) 30 MG capsule Take 30 mg by mouth daily.    Marland Kitchen nystatin (MYCOSTATIN) 100000 UNIT/ML suspension Take 5 mLs (500,000 Units total) by mouth 4 (four) times daily. 240 mL 0  . Polyethyl Glycol-Propyl Glycol (SYSTANE) 0.4-0.3 % GEL Place 1 application into both eyes daily.    . polyethylene glycol powder (MIRALAX) powder Take 17 g by mouth as needed. For constipation    . thyroid (ARMOUR) 30 MG tablet Take 30 mg by mouth daily before breakfast.    . valACYclovir (VALTREX) 1000 MG tablet Take 1 tablet 3 times daily 40 tablet 2  . zonisamide (ZONEGRAN) 25 MG capsule Take 75 mg by mouth daily.    Marland Kitchen dicyclomine (BENTYL) 10 MG capsule Take 1 capsule (10 mg total) by mouth 4 (four) times daily -  before meals and at bedtime. As needed 120 capsule 5   No current facility-administered medications on file prior to visit.    Review of Systems  Constitutional: Negative for unusual diaphoresis or other sweats  HENT: Negative for ringing in ear Eyes: Negative for double vision or worsening visual disturbance.  Respiratory: Negative for choking and stridor.   Gastrointestinal: Negative for vomiting or other signifcant bowel change Genitourinary: Negative for hematuria or decreased urine volume.  Musculoskeletal: Negative for other MSK pain or swelling Skin: Negative for color change and worsening wound.  Neurological: Negative for tremors and numbness other than noted  Psychiatric/Behavioral: Negative for decreased concentration or agitation other than above       Objective:   Physical Exam BP 133/88 mmHg  Pulse 67  Temp(Src) 98.5 F (36.9 C) (Oral)  Ht $R'5\' 4"'Ev$  (1.626 m)  Wt 219 lb (99.338 kg)  BMI 37.57 kg/m2  SpO2 96% VS noted,  Constitutional: Pt appears well-developed, well-nourished.  HENT: Head: NCAT.  Right Ear: External ear  normal.  Left Ear: External ear normal.  Eyes: . Pupils are equal, round, and reactive to light. Conjunctivae and EOM are normal Neck: Normal range of motion. Neck supple.  Cardiovascular: Normal rate and regular rhythm.   Pulmonary/Chest: Effort normal and breath sounds normal.  Abd:  Soft, NT, ND, + BS Neurological: Pt is alert. Not confused , motor grossly intact, has mild decr sens to right ant mid thigh Skin: Skin is warm. Right lower back grouped vesicles on erythem base noted smallish area but sens to palpation Psychiatric: Pt  behavior is normal. No agitation. mild dysphoric appaaring     Assessment & Plan:

## 2014-01-01 NOTE — Telephone Encounter (Signed)
Patient states she [facility] a form [facility]. The form is from the [facility].   She states that the staff was to put it in Dr. Gwynn Burly box.  Do you have this form?  This form is due bact 12/15.

## 2014-01-02 NOTE — Telephone Encounter (Signed)
I vaguely recall a form with her name and I believ I filled it out, and forwarded to staff to help further (not sure what happened after that)

## 2014-01-04 NOTE — Assessment & Plan Note (Signed)
stable overall by history and exam, recent data reviewed with pt, and pt to continue medical treatment as before,  to f/u any worsening symptoms or concerns SpO2 Readings from Last 3 Encounters:  01/01/14 96%  11/28/13 100%  10/30/13 95%

## 2014-01-04 NOTE — Assessment & Plan Note (Signed)
Recent onset, not yet scabbed, for pain med refill, finish valtrex,  to f/u any worsening symptoms or concerns, d/w pt possible PHN

## 2014-01-04 NOTE — Assessment & Plan Note (Signed)
stable overall by history and exam,  and pt to continue medical treatment as before,  to f/u any worsening symptoms or concerns, verified no SI 

## 2014-01-05 ENCOUNTER — Telehealth: Payer: Self-pay | Admitting: *Deleted

## 2014-01-05 NOTE — Telephone Encounter (Signed)
[name] - Client [place] [facility] Patient Name: [name] Gender: Female DOB: [birth year] Age: 63 Y 1 M 25 D Return Phone Number: [phone] (Primary) Address: [address] City/State/Zip: [place] [address] Client [facility] - Client Client Site [place] - Day Physician Cathlean Cower Contact Type Call Call Type Triage / Clinical Relationship To Patient Self Return Phone Number [phone] (Primary) Chief Complaint Rash - Localized Initial Comment Caller states she has had shingles for about a week now. The lesions have turned brown. Is she still contagious???? Nurse Assessment Nurse: Tamala Julian, RN, Janett Billow Date/Time Eilene Ghazi Time): 01/05/2014 1:13:36 PM Confirm and document reason for call. If symptomatic, describe symptoms. ---Caller states she has had shingles for about a week now. The lesions have turned brown. Asking if she is she still contagious Has the patient traveled out of the country within the last 30 days? ---Not Applicable Does the patient require triage? ---No Please document clinical information provided and list any resource used. ---A person with active shingles can spread the virus when the rash is in the blister-phase. A person is not infectious before the blisters appear. Once the rash has developed crusts, the person is no longer contagious. If you have shingles Keep the rash covered. Avoid touching or scratching the rash. Wash your hands often to prevent the spread of varicella zoster virus. Until your rash has developed crusts, avoid contact with pregnant women who have never had chickenpox or the chickenpox vaccine; premature or low birth weight infants; and people with weakened immune systems, such as people receiving immunosuppressive medications or undergoing chemotherapy, organ transplant recipients, and people with human immunodeficiency  virus (HIV) infection. per [employer].cy transmission.html Guidelines Guideline Title Affirmed Question Affirmed Notes Nurse Date/Time (Eastern Time) Disp. Time Eilene Ghazi Time) Disposition Final User 01/05/2014 1:19:22 PM Clinical Call Yes Tamala Julian, RN, Janett Billow After Care Instructions Given Call Event Type User Date / Time Description

## 2014-01-06 ENCOUNTER — Telehealth: Payer: Self-pay | Admitting: *Deleted

## 2014-01-06 ENCOUNTER — Ambulatory Visit (INDEPENDENT_AMBULATORY_CARE_PROVIDER_SITE_OTHER): Payer: Medicare Other | Admitting: Family

## 2014-01-06 ENCOUNTER — Encounter: Payer: Self-pay | Admitting: Family

## 2014-01-06 VITALS — BP 118/64 | HR 116 | Temp 98.5°F | Wt 218.0 lb

## 2014-01-06 DIAGNOSIS — B029 Zoster without complications: Secondary | ICD-10-CM

## 2014-01-06 DIAGNOSIS — J029 Acute pharyngitis, unspecified: Secondary | ICD-10-CM

## 2014-01-06 MED ORDER — AMOXICILLIN 500 MG PO TABS: 1000.0000 mg | ORAL_TABLET | Freq: Two times a day (BID) | ORAL | Status: AC

## 2014-01-06 NOTE — Progress Notes (Signed)
Pre visit review using our clinic review tool, if applicable. No additional management support is needed unless otherwise documented below in the visit note. 

## 2014-01-06 NOTE — Telephone Encounter (Signed)
[facility] [facility] - Client [place] [facility] Patient Name: [name] Gender: Female DOB: 1950/11/29 Age: 63 Y 1 M 26 D Return Phone Number: [phone] (Primary) Address: [place] City/State/Zip: [place] [address] Client [facility] - Client Client Site [place] - Day Physician Cathlean Cower Contact Type Call Call Type Triage / Clinical Relationship To Patient Self Return Phone Number [phone] (Primary) Chief Complaint Sore Throat Initial Comment Caller states has shingles; was better, suddenly yesterday throat became sore; white pus pockets in back of throat; ear pain; using nystatin and anti viral meds; will antiviral meds be enough? saw Dr Jenny Reichmann thursday; PreDisposition Call Doctor Nurse Assessment Nurse: Margaret Mussel, RN, Alveta Heimlich Date/Time Eilene Ghazi Time): 01/06/2014 9:02:13 AM Confirm and document reason for call. If symptomatic, describe symptoms. ---Caller states she has shingles and developed a sore throat yesterday. She has white pus pockets at the back of her throat. She rates her throat pain as 10 on 0-10 scale. Denies fever. Denies difficulty breathing. She states that her tonsils were removed 16 years ago but "a piece of them have grown back." Has the patient traveled out of the country within the last 30 days? ---No Does the patient require triage? ---Yes Related visit to physician within the last 2 weeks? ---No Does the PT have any chronic conditions? (i.e. diabetes, asthma, etc.) ---Yes List chronic conditions. ---Asthma, Sleep Apnea, Fibromyalgia, Migraines Guidelines Guideline Title Affirmed Question Affirmed Notes Nurse Date/Time Eilene Ghazi Time) Sore Throat SEVERE (e.g., excruciating) throat pain Margaret Mussel, RN, Alveta Heimlich 01/06/2014 9:05:26 AM Disp. Time Eilene Ghazi Time) Disposition Final User 01/06/2014 9:08:09 AM See Physician within 24 Hours Yes Margaret Mussel, RN,  [name] Understands: Yes Disagree/Comply: Comply

## 2014-01-06 NOTE — Patient Instructions (Signed)

## 2014-01-06 NOTE — Progress Notes (Signed)
Subjective:    Patient ID: [name], female    DOB: 07-14-1950, 63 y.o.   MRN: [id number]  HPI 63 year old African-American female, nonsmoker with history of fibromyalgia is in today with complaints of sore throat that began yesterday. Reports seeing pus on the right side of her throat. Is currently under the care of of Dr. Jenny Reichmann for shingles and taken Valtrex. She's also taken nystatin for thrush. Reports decreased appetite and a slight headache. Has a history of strep pharyngitis in the past that is presented similarly. Has had a tonsillectomy but reports this didn't decrease the incidence of pharyngitis.   Review of Systems  Constitutional: Positive for fatigue. Negative for fever and chills.  HENT: Positive for sore throat. Negative for congestion, postnasal drip, sinus pressure and sneezing.   Respiratory: Negative.   Cardiovascular: Negative.   Gastrointestinal: Negative.   Endocrine: Negative.   Genitourinary: Negative.   Musculoskeletal: Positive for myalgias and arthralgias.       Has fibromyalgia  Skin: Positive for rash.       Actively being treated for shingles  Allergic/Immunologic: Negative.   Neurological: Negative.   Psychiatric/Behavioral: Negative.    Past Medical History  Diagnosis Date  . Endometriosis   . Osteopenia   . Asthma   . Fibromyalgia   . Thyroid disease     Hypothyroid  . Elevated cholesterol   . Migraines   . Bowel obstruction   . GERD (gastroesophageal reflux disease)   . Impaired glucose tolerance 02/25/2011  . Complication of anesthesia     "I have the shakes"  . Heart murmur     mvp  . Shortness of breath     with exertion  . Arthritis   . Low back pain   . Hx of oral aphthous ulcers   . Biliary dyskinesia   . Diverticulosis   . Nocturia   . Hypothyroidism     hx nodule on thyroid   . Sleep apnea     has not used c-pap x 1 yr.   . Depression   . Cervical dysplasia   . Polycythemia vera   . BRCA negative 02/2013  .  Hypertension     History   Social History  . Marital Status: Single    Spouse Name: N/A    Number of Children: 2  . Years of Education: N/A   Occupational History  . retired    Social History Main Topics  . Smoking status: Never Smoker   . Smokeless tobacco: Never Used  . Alcohol Use: 0.0 oz/week     Comment: socially  . Drug Use: No  . Sexual Activity: No     Comment: HYST   Other Topics Concern  . Not on file   Social History Narrative    Past Surgical History  Procedure Laterality Date  . Tonsillectomy    . Pelvic laparoscopy  1995,2002    DL LSO DL RSO  . Vaginal hysterectomy  1983  . Oophorectomy      LSO-BSO  . Breast surgery      Benign breast cyst  . Cholecystectomy  12/05/2011    Procedure: LAPAROSCOPIC CHOLECYSTECTOMY;  Surgeon: Ralene Ok, MD;  Location: [facility];  Service: General;  Laterality: N/A;  . Colposcopy    . Gynecologic cryosurgery      Family History  Problem Relation Age of Onset  . Hypertension Mother   . Heart disease Mother   . Breast cancer Mother  Age 72  . Cancer Mother     Bonecancer  . Hypertension Father   . Heart disease Father   . Diabetes Father   . Colon cancer Sister   . Sarcoidosis Sister   . Cancer Sister     colon  . Breast cancer Maternal Grandmother     Age 86  . Ovarian cancer Paternal Grandmother   . Breast cancer Paternal Grandmother     Age unknown  . Sarcoidosis Brother   . Breast cancer Maternal Aunt     Age 63's  . Breast cancer Maternal Aunt     Age 71    Allergies  Allergen Reactions  . Celecoxib Nausea Only and Nausea And Vomiting  . Codeine Nausea Only    REACTION: Nausea  . Duloxetine Nausea And Vomiting    REACTION: n/v REACTION: n/v  . Erythromycin     REACTION: Itching  . Escitalopram Oxalate Nausea Only  . Ezetimibe Nausea And Vomiting  . Iohexol Nausea And Vomiting     Desc: VIOX, CELEBREX, EMYCIN  Desc: VIOX, CELEBREX, EMYCIN   . Milnacipran Nausea And Vomiting     REACTION: n/v REACTION: n/v  . Pregabalin Itching and Nausea And Vomiting    REACTION: wt gain REACTION: wt gain  . Rofecoxib Nausea And Vomiting  . Statins Nausea And Vomiting    Body aches  . Tramadol Nausea Only    Current Outpatient Prescriptions on File Prior to Visit  Medication Sig Dispense Refill  . albuterol (PROVENTIL HFA;VENTOLIN HFA) 108 (90 BASE) MCG/ACT inhaler Inhale 2 puffs into the lungs every 6 (six) hours as needed for wheezing. For wheezing 3.7 g 11  . aspirin EC 81 MG EC tablet Take 1 tablet (81 mg total) by mouth daily. 30 tablet 0  . Calcium Carbonate-Vitamin D (CALCIUM + D PO) Take 1 tablet by mouth daily.    . cycloSPORINE (RESTASIS) 0.05 % ophthalmic emulsion Place 1 drop into both eyes 2 (two) times daily.    . Estradiol Acetate 0.1 MG/24HR RING Place 0.1 each vaginally every 3 (three) months 0.1 each 0  . fentaNYL (DURAGESIC - DOSED MCG/HR) 25 MCG/HR Place 1 patch onto the skin every 3 (three) days.    . fluticasone (FLONASE) 50 MCG/ACT nasal spray Place 2 sprays into both nostrils as needed.     . hydrochlorothiazide (HYDRODIURIL) 25 MG tablet Take 25 mg by mouth daily as needed (for edema).    Marland Kitchen HYDROcodone-acetaminophen (NORCO/VICODIN) 5-325 MG per tablet Take 1 tablet by mouth every 6 (six) hours as needed for moderate pain. 60 tablet 0  . lansoprazole (PREVACID) 30 MG capsule Take 30 mg by mouth daily.    Marland Kitchen nystatin (MYCOSTATIN) 100000 UNIT/ML suspension Take 5 mLs (500,000 Units total) by mouth 4 (four) times daily. 240 mL 0  . Polyethyl Glycol-Propyl Glycol (SYSTANE) 0.4-0.3 % GEL Place 1 application into both eyes daily.    . polyethylene glycol powder (MIRALAX) powder Take 17 g by mouth as needed. For constipation    . thyroid (ARMOUR) 30 MG tablet Take 30 mg by mouth daily before breakfast.    . valACYclovir (VALTREX) 1000 MG tablet Take 1 tablet 3 times daily 40 tablet 2  . zonisamide (ZONEGRAN) 25 MG capsule Take 75 mg by mouth daily.    Marland Kitchen  dicyclomine (BENTYL) 10 MG capsule Take 1 capsule (10 mg total) by mouth 4 (four) times daily -  before meals and at bedtime. As needed 120 capsule 5  No current facility-administered medications on file prior to visit.    BP 118/64 mmHg  Pulse 116  Temp(Src) 98.5 F (36.9 C) (Oral)  Wt 218 lb (98.884 kg)  SpO2 92%chart    Objective:   Physical Exam  Constitutional: She is oriented to person, place, and time. She appears well-developed and well-nourished.  HENT:  Right Ear: External ear normal.  Left Ear: External ear normal.  Pharynx moderately red and edematous on the right.  Neck: Normal range of motion. Neck supple.  Cardiovascular: Normal rate, regular rhythm and normal heart sounds.   Pulmonary/Chest: Effort normal and breath sounds normal.  Musculoskeletal: Normal range of motion.  Neurological: She is alert and oriented to person, place, and time.  Skin: Skin is warm and dry.  Psychiatric: She has a normal mood and affect.      Rapid strep: Negative    Assessment & Plan:  Ardith was seen today for sore throat.  Diagnoses and associated orders for this visit:  Acute pharyngitis, unspecified pharyngitis type  Shingles  Other Orders - amoxicillin (AMOXIL) 500 MG tablet; Take 2 tablets (1,000 mg total) by mouth 2 (two) times daily.    Due to history, we'll treat with amoxicillin 2 capsules twice a day for 10 days. Salt water gargles. Ibuprofen as needed for sore throat. Call the office if symptoms worsen or persist. Recheck with PCP as scheduled and sooner as needed.

## 2014-01-19 ENCOUNTER — Telehealth: Payer: Self-pay | Admitting: Internal Medicine

## 2014-01-19 NOTE — Telephone Encounter (Signed)
That was complete course

## 2014-01-19 NOTE — Telephone Encounter (Signed)
Patient states that she has taken all of her shingles medication,. Wants to know if it needs to be refilled or if this is enough as is.

## 2014-01-19 NOTE — Telephone Encounter (Signed)
Spoke with patient about course complete. She informed me that what happened is she only had the RX filled once & noticed she had 2 more refills. Patient stated that she is still having itching & small bumps appearing. So I suggested she get the Rx filled until symptoms are no longer present (per Steffanie C. CMA)

## 2014-02-06 ENCOUNTER — Encounter: Payer: Self-pay | Admitting: Internal Medicine

## 2014-02-06 ENCOUNTER — Ambulatory Visit (INDEPENDENT_AMBULATORY_CARE_PROVIDER_SITE_OTHER)
Admission: RE | Admit: 2014-02-06 | Discharge: 2014-02-06 | Disposition: A | Discharge status: Home / Self Care | Payer: Medicare Other | Source: Ambulatory Visit | Attending: Internal Medicine | Admitting: Internal Medicine

## 2014-02-06 ENCOUNTER — Telehealth: Payer: Self-pay | Admitting: Internal Medicine

## 2014-02-06 ENCOUNTER — Ambulatory Visit (INDEPENDENT_AMBULATORY_CARE_PROVIDER_SITE_OTHER): Payer: Medicare Other | Admitting: Internal Medicine

## 2014-02-06 VITALS — BP 120/72 | HR 95 | Temp 98.9°F | Ht 64.0 in | Wt 221.5 lb

## 2014-02-06 DIAGNOSIS — J209 Acute bronchitis, unspecified: Secondary | ICD-10-CM

## 2014-02-06 DIAGNOSIS — J441 Chronic obstructive pulmonary disease with (acute) exacerbation: Secondary | ICD-10-CM

## 2014-02-06 DIAGNOSIS — R7302 Impaired glucose tolerance (oral): Secondary | ICD-10-CM

## 2014-02-06 MED ORDER — ALBUTEROL SULFATE HFA 108 (90 BASE) MCG/ACT IN AERS: 2.0000 | INHALATION_SPRAY | Freq: Four times a day (QID) | RESPIRATORY_TRACT | Status: DC | PRN

## 2014-02-06 MED ORDER — PREDNISONE 10 MG PO TABS: ORAL_TABLET | ORAL | Status: DC

## 2014-02-06 MED ORDER — HYDROCODONE-HOMATROPINE 5-1.5 MG/5ML PO SYRP: 5.0000 mL | ORAL_SOLUTION | Freq: Four times a day (QID) | ORAL | Status: DC | PRN

## 2014-02-06 MED ORDER — LEVOFLOXACIN 250 MG PO TABS: 250.0000 mg | ORAL_TABLET | Freq: Every day | ORAL | Status: DC

## 2014-02-06 MED ORDER — FLUTICASONE PROPIONATE 50 MCG/ACT NA SUSP: 2.0000 | NASAL | Status: DC | PRN

## 2014-02-06 MED ORDER — METHYLPREDNISOLONE ACETATE 80 MG/ML IJ SUSP
80.0000 mg | Freq: Once | INTRAMUSCULAR | Status: AC
  Administered 2014-02-06: 80 mg via INTRAMUSCULAR

## 2014-02-06 MED ORDER — THYROID 30 MG PO TABS: 30.0000 mg | ORAL_TABLET | Freq: Every day | ORAL | Status: DC

## 2014-02-06 NOTE — Telephone Encounter (Signed)
Pt is requesting a refill on her Flonase.

## 2014-02-06 NOTE — Telephone Encounter (Signed)
Refill done and patient notified. 

## 2014-02-06 NOTE — Patient Instructions (Addendum)
You had the steroid shot today   Please take all new medication as prescribed - the antibiotic, cough medicine and prednisone  Please continue all other medications as before, and refills have been done if requested.  Please have the pharmacy call with any other refills you may need.  Please keep your appointments with your specialists as you may have planned  Please go to the XRAY Department in the Basement (go straight as you get off the elevator) for the x-ray testing  You will be contacted by phone if any changes need to be made immediately.  Otherwise, you will receive a letter about your results with an explanation, but please check with MyChart first.  Please remember to sign up for MyChart if you have not done so, as this will be important to you in the future with finding out test results, communicating by private email, and scheduling acute appointments online when needed.    

## 2014-02-06 NOTE — Progress Notes (Signed)
Pre visit review using our clinic review tool, if applicable. No additional management support is needed unless otherwise documented below in the visit note. 

## 2014-02-07 NOTE — Assessment & Plan Note (Signed)
Mild to mod, for depomedrol IM, predpac asd, to f/u any worsening symptoms or concerns 

## 2014-02-07 NOTE — Assessment & Plan Note (Signed)
Lab Results  Component Value Date   HGBA1C 5.2 08/27/2012   stable overall by history and exam, recent data reviewed with pt, and pt to continue medical treatment as before,  to f/u any worsening symptoms or concerns, pt to call for onset polys or cbg > 200 with steroid tx

## 2014-02-07 NOTE — Assessment & Plan Note (Addendum)
Mild to mod, for antibx course,  to f/u any worsening symptoms or concerns, also for cxr - r/o pna

## 2014-02-07 NOTE — Progress Notes (Signed)
Subjective:    Patient ID: [name], female    DOB: 05/19/1950, 64 y.o.   MRN: [id number]  HPI  Here with 2-3 days acute onset fever, facial pain, pressure, headache, general weakness and malaise, and greenish d/c, with mild ST and cough, but pt denies chest pain, wheezing, increased sob or doe, orthopnea, PND, increased LE swelling, palpitations, dizziness or syncope.except for onset mild wheezing/sob last pm.   Past Medical History  Diagnosis Date  . Endometriosis   . Osteopenia   . Asthma   . Fibromyalgia   . Thyroid disease     Hypothyroid  . Elevated cholesterol   . Migraines   . Bowel obstruction   . GERD (gastroesophageal reflux disease)   . Impaired glucose tolerance 02/25/2011  . Complication of anesthesia     "I have the shakes"  . Heart murmur     mvp  . Shortness of breath     with exertion  . Arthritis   . Low back pain   . Hx of oral aphthous ulcers   . Biliary dyskinesia   . Diverticulosis   . Nocturia   . Hypothyroidism     hx nodule on thyroid   . Sleep apnea     has not used c-pap x 1 yr.   . Depression   . Cervical dysplasia   . Polycythemia vera   . BRCA negative 02/2013  . Hypertension    Past Surgical History  Procedure Laterality Date  . Tonsillectomy    . Pelvic laparoscopy  1995,2002    DL LSO DL RSO  . Vaginal hysterectomy  1983  . Oophorectomy      LSO-BSO  . Breast surgery      Benign breast cyst  . Cholecystectomy  12/05/2011    Procedure: LAPAROSCOPIC CHOLECYSTECTOMY;  Surgeon: Ralene Ok, MD;  Location: [facility];  Service: General;  Laterality: N/A;  . Colposcopy    . Gynecologic cryosurgery      reports that she has never smoked. She has never used smokeless tobacco. She reports that she drinks alcohol. She reports that she does not use illicit drugs. family history includes Breast cancer in her maternal aunt, maternal aunt, maternal grandmother, mother, and paternal grandmother; Cancer in her mother and sister; Colon  cancer in her sister; Diabetes in her father; Heart disease in her father and mother; Hypertension in her father and mother; Ovarian cancer in her paternal grandmother; Sarcoidosis in her brother and sister. Allergies  Allergen Reactions  . Celecoxib Nausea Only and Nausea And Vomiting  . Codeine Nausea Only    REACTION: Nausea  . Duloxetine Nausea And Vomiting    REACTION: n/v REACTION: n/v  . Erythromycin     REACTION: Itching  . Escitalopram Oxalate Nausea Only  . Ezetimibe Nausea And Vomiting  . Iohexol Nausea And Vomiting     Desc: VIOX, CELEBREX, EMYCIN  Desc: VIOX, CELEBREX, EMYCIN   . Milnacipran Nausea And Vomiting    REACTION: n/v REACTION: n/v  . Pregabalin Itching and Nausea And Vomiting    REACTION: wt gain REACTION: wt gain  . Rofecoxib Nausea And Vomiting  . Statins Nausea And Vomiting    Body aches  . Tramadol Nausea Only   Current Outpatient Prescriptions on File Prior to Visit  Medication Sig Dispense Refill  . aspirin EC 81 MG EC tablet Take 1 tablet (81 mg total) by mouth daily. 30 tablet 0  . Calcium Carbonate-Vitamin D (CALCIUM + D PO)  Take 1 tablet by mouth daily.    . cycloSPORINE (RESTASIS) 0.05 % ophthalmic emulsion Place 1 drop into both eyes 2 (two) times daily.    Marland Kitchen dicyclomine (BENTYL) 10 MG capsule Take 1 capsule (10 mg total) by mouth 4 (four) times daily -  before meals and at bedtime. As needed 120 capsule 5  . Estradiol Acetate 0.1 MG/24HR RING Place 0.1 each vaginally every 3 (three) months 0.1 each 0  . fentaNYL (DURAGESIC - DOSED MCG/HR) 25 MCG/HR Place 1 patch onto the skin every 3 (three) days.    . hydrochlorothiazide (HYDRODIURIL) 25 MG tablet Take 25 mg by mouth daily as needed (for edema).    Marland Kitchen HYDROcodone-acetaminophen (NORCO/VICODIN) 5-325 MG per tablet Take 1 tablet by mouth every 6 (six) hours as needed for moderate pain. 60 tablet 0  . lansoprazole (PREVACID) 30 MG capsule Take 30 mg by mouth daily.    Marland Kitchen nystatin (MYCOSTATIN)  100000 UNIT/ML suspension Take 5 mLs (500,000 Units total) by mouth 4 (four) times daily. 240 mL 0  . Polyethyl Glycol-Propyl Glycol (SYSTANE) 0.4-0.3 % GEL Place 1 application into both eyes daily.    . polyethylene glycol powder (MIRALAX) powder Take 17 g by mouth as needed. For constipation    . valACYclovir (VALTREX) 1000 MG tablet Take 1 tablet 3 times daily 40 tablet 2  . zonisamide (ZONEGRAN) 25 MG capsule Take 75 mg by mouth daily.     No current facility-administered medications on file prior to visit.   Review of Systems  Constitutional: Negative for unusual diaphoresis or other sweats  HENT: Negative for ringing in ear Eyes: Negative for double vision or worsening visual disturbance.  Respiratory: Negative for choking and stridor.   Gastrointestinal: Negative for vomiting or other signifcant bowel change Genitourinary: Negative for hematuria or decreased urine volume.  Musculoskeletal: Negative for other MSK pain or swelling Skin: Negative for color change and worsening wound.  Neurological: Negative for tremors and numbness other than noted  Psychiatric/Behavioral: Negative for decreased concentration or agitation other than above       Objective:   Physical Exam BP 120/72 mmHg  Pulse 95  Temp(Src) 98.9 F (37.2 C) (Oral)  Ht $R'5\' 4"'hv$  (1.626 m)  Wt 221 lb 8 oz (100.472 kg)  BMI 38.00 kg/m2  SpO2 91% VS noted, .mill ill appearing Constitutional: Pt appears well-developed, well-nourished.  HENT: Head: NCAT.  Right Ear: External ear normal.  Left Ear: External ear normal.  Eyes: . Pupils are equal, round, and reactive to light. Conjunctivae and EOM are normal Bilat tm's with mild erythema.  Max sinus areas mild tender.  Pharynx with mild erythema, no exudate Neck: Normal range of motion. Neck supple.  Cardiovascular: Normal rate and regular rhythm.   Pulmonary/Chest: Effort normal and breath sounds without rales, but with few bilat mild wheezes Neurological: Pt is  alert. Not confused , motor grossly intact Skin: Skin is warm. No rash Psychiatric: Pt behavior is normal. No agitation.     Assessment & Plan:

## 2014-02-09 ENCOUNTER — Telehealth: Payer: Self-pay | Admitting: Internal Medicine

## 2014-02-09 ENCOUNTER — Telehealth: Payer: Self-pay

## 2014-02-09 NOTE — Telephone Encounter (Signed)
-----   Message from Hendricks Limes, MD sent at 02/09/2014  9:22 AM EST ----- The chest Xray reveals no pneumonia, It shows hyperinflation changes expected with her asthmatic  & obstructive lung disease. Follow up if no better

## 2014-02-09 NOTE — Telephone Encounter (Signed)
Patient has been advised

## 2014-02-09 NOTE — Telephone Encounter (Signed)
Pt would like her results of her xray today.  Dr Jenny Reichmann is not in today can someone take a look at these results and call pt?

## 2014-02-17 ENCOUNTER — Encounter: Payer: Medicare Other | Admitting: Gynecology

## 2014-03-04 ENCOUNTER — Other Ambulatory Visit: Payer: Self-pay

## 2014-03-04 DIAGNOSIS — Z1231 Encounter for screening mammogram for malignant neoplasm of breast: Secondary | ICD-10-CM

## 2014-03-11 ENCOUNTER — Ambulatory Visit
Admission: RE | Admit: 2014-03-11 | Discharge: 2014-03-11 | Disposition: A | Discharge status: Home / Self Care | Payer: Medicare Other | Source: Ambulatory Visit

## 2014-03-11 DIAGNOSIS — Z1231 Encounter for screening mammogram for malignant neoplasm of breast: Secondary | ICD-10-CM

## 2014-03-12 ENCOUNTER — Other Ambulatory Visit: Payer: Self-pay | Admitting: Gynecology

## 2014-03-12 DIAGNOSIS — N644 Mastodynia: Secondary | ICD-10-CM

## 2014-03-12 DIAGNOSIS — N63 Unspecified lump in unspecified breast: Secondary | ICD-10-CM

## 2014-03-13 ENCOUNTER — Ambulatory Visit (INDEPENDENT_AMBULATORY_CARE_PROVIDER_SITE_OTHER): Payer: Medicare Other | Admitting: Internal Medicine

## 2014-03-13 ENCOUNTER — Encounter: Payer: Self-pay | Admitting: Internal Medicine

## 2014-03-13 VITALS — BP 128/94 | HR 93 | Temp 98.3°F | Ht 64.0 in | Wt 220.5 lb

## 2014-03-13 DIAGNOSIS — M79674 Pain in right toe(s): Secondary | ICD-10-CM

## 2014-03-13 DIAGNOSIS — K1379 Other lesions of oral mucosa: Secondary | ICD-10-CM

## 2014-03-13 DIAGNOSIS — F329 Major depressive disorder, single episode, unspecified: Secondary | ICD-10-CM

## 2014-03-13 DIAGNOSIS — F32A Depression, unspecified: Secondary | ICD-10-CM

## 2014-03-13 DIAGNOSIS — I10 Essential (primary) hypertension: Secondary | ICD-10-CM

## 2014-03-13 MED ORDER — IBUPROFEN 600 MG PO TABS: 600.0000 mg | ORAL_TABLET | Freq: Three times a day (TID) | ORAL | Status: DC | PRN

## 2014-03-13 NOTE — Patient Instructions (Signed)
Please take all new medication as prescribed - the anti-inflammatory  Please continue all other medications as before, and refills have been done if requested.  Please have the pharmacy call with any other refills you may need.  Please keep your appointments with your specialists as you may have planned 

## 2014-03-13 NOTE — Progress Notes (Signed)
Pre visit review using our clinic review tool, if applicable. No additional management support is needed unless otherwise documented below in the visit note. 

## 2014-03-13 NOTE — Progress Notes (Signed)
Subjective:    Patient ID: [name], female    DOB: 05-14-1950, 64 y.o.   MRN: [id number]  HPI Here with several complaints, primarily releated to "mouth pain" but means the left facial area, sharp, mild, intermittent x several days, worse to talk and chew, but no fever, sinus congestion, other facial pain, swelling, redness, ear pain, cough, ST or other HA.  Seems to be worse with wearing the mouth guard she is required to wear to reduce her risk of secondary polycythemia, with f/u cbc planned. Does also have and using magic mouthwash.  Also c/o right great toe MTP pain without sweling, has been  Mild intermittent for several wks, with off and on swelling but no redness, better to stay off her feet, worse to walk and stand during the day. Pt denies chest pain, increased sob or doe, wheezing, orthopnea, PND, increased LE swelling, palpitations, dizziness or syncope.  Denies worsening depressive symptoms, suicidal ideation, or panic Past Medical History  Diagnosis Date  . Endometriosis   . Osteopenia   . Asthma   . Fibromyalgia   . Thyroid disease     Hypothyroid  . Elevated cholesterol   . Migraines   . Bowel obstruction   . GERD (gastroesophageal reflux disease)   . Impaired glucose tolerance 02/25/2011  . Complication of anesthesia     "I have the shakes"  . Heart murmur     mvp  . Shortness of breath     with exertion  . Arthritis   . Low back pain   . Hx of oral aphthous ulcers   . Biliary dyskinesia   . Diverticulosis   . Nocturia   . Hypothyroidism     hx nodule on thyroid   . Sleep apnea     has not used c-pap x 1 yr.   . Depression   . Cervical dysplasia   . Polycythemia vera   . BRCA negative 02/2013  . Hypertension    Past Surgical History  Procedure Laterality Date  . Tonsillectomy    . Pelvic laparoscopy  1995,2002    DL LSO DL RSO  . Vaginal hysterectomy  1983  . Oophorectomy      LSO-BSO  . Breast surgery      Benign breast cyst  .  Cholecystectomy  12/05/2011    Procedure: LAPAROSCOPIC CHOLECYSTECTOMY;  Surgeon: Ralene Ok, MD;  Location: [facility];  Service: General;  Laterality: N/A;  . Colposcopy    . Gynecologic cryosurgery      reports that she has never smoked. She has never used smokeless tobacco. She reports that she drinks alcohol. She reports that she does not use illicit drugs. family history includes Breast cancer in her maternal aunt, maternal aunt, maternal grandmother, mother, and paternal grandmother; Cancer in her mother and sister; Colon cancer in her sister; Diabetes in her father; Heart disease in her father and mother; Hypertension in her father and mother; Ovarian cancer in her paternal grandmother; Sarcoidosis in her brother and sister. Allergies  Allergen Reactions  . Celecoxib Nausea Only and Nausea And Vomiting  . Codeine Nausea Only    REACTION: Nausea  . Duloxetine Nausea And Vomiting    REACTION: n/v REACTION: n/v  . Erythromycin     REACTION: Itching  . Escitalopram Oxalate Nausea Only  . Ezetimibe Nausea And Vomiting  . Iohexol Nausea And Vomiting     Desc: VIOX, CELEBREX, EMYCIN  Desc: VIOX, CELEBREX, EMYCIN   . Milnacipran Nausea  And Vomiting    REACTION: n/v REACTION: n/v  . Pregabalin Itching and Nausea And Vomiting    REACTION: wt gain REACTION: wt gain  . Rofecoxib Nausea And Vomiting  . Statins Nausea And Vomiting    Body aches  . Tramadol Nausea Only   Current Outpatient Prescriptions on File Prior to Visit  Medication Sig Dispense Refill  . albuterol (PROVENTIL HFA;VENTOLIN HFA) 108 (90 BASE) MCG/ACT inhaler Inhale 2 puffs into the lungs every 6 (six) hours as needed for wheezing. For wheezing 3.7 g 11  . aspirin EC 81 MG EC tablet Take 1 tablet (81 mg total) by mouth daily. 30 tablet 0  . Calcium Carbonate-Vitamin D (CALCIUM + D PO) Take 1 tablet by mouth daily.    . cycloSPORINE (RESTASIS) 0.05 % ophthalmic emulsion Place 1 drop into both eyes 2 (two) times  daily.    . Estradiol Acetate 0.1 MG/24HR RING Place 0.1 each vaginally every 3 (three) months 0.1 each 0  . fentaNYL (DURAGESIC - DOSED MCG/HR) 25 MCG/HR Place 1 patch onto the skin every 3 (three) days.    . fluticasone (FLONASE) 50 MCG/ACT nasal spray Place 2 sprays into both nostrils as needed. 16 g 11  . hydrochlorothiazide (HYDRODIURIL) 25 MG tablet Take 25 mg by mouth daily as needed (for edema).    Marland Kitchen HYDROcodone-acetaminophen (NORCO/VICODIN) 5-325 MG per tablet Take 1 tablet by mouth every 6 (six) hours as needed for moderate pain. 60 tablet 0  . HYDROcodone-homatropine (HYCODAN) 5-1.5 MG/5ML syrup Take 5 mLs by mouth every 6 (six) hours as needed for cough. 180 mL 0  . lansoprazole (PREVACID) 30 MG capsule Take 30 mg by mouth daily.    Marland Kitchen nystatin (MYCOSTATIN) 100000 UNIT/ML suspension Take 5 mLs (500,000 Units total) by mouth 4 (four) times daily. 240 mL 0  . Polyethyl Glycol-Propyl Glycol (SYSTANE) 0.4-0.3 % GEL Place 1 application into both eyes daily.    . polyethylene glycol powder (MIRALAX) powder Take 17 g by mouth as needed. For constipation    . predniSONE (DELTASONE) 10 MG tablet 3 tabs by mouth per day for 3 days,2tabs per day for 3 days,1tab per day for 3 days 18 tablet 0  . thyroid (ARMOUR) 30 MG tablet Take 1 tablet (30 mg total) by mouth daily before breakfast. 90 tablet 3  . valACYclovir (VALTREX) 1000 MG tablet Take 1 tablet 3 times daily 40 tablet 2  . zonisamide (ZONEGRAN) 25 MG capsule Take 75 mg by mouth daily.    Marland Kitchen dicyclomine (BENTYL) 10 MG capsule Take 1 capsule (10 mg total) by mouth 4 (four) times daily -  before meals and at bedtime. As needed 120 capsule 5   No current facility-administered medications on file prior to visit.    Review of Systems  Constitutional: Negative for unusual diaphoresis or other sweats  HENT: Negative for ringing in ear Eyes: Negative for double vision or worsening visual disturbance.  Respiratory: Negative for choking and stridor.    Gastrointestinal: Negative for vomiting or other signifcant bowel change Genitourinary: Negative for hematuria or decreased urine volume.  Musculoskeletal: Negative for other MSK pain or swelling Skin: Negative for color change and worsening wound.  Neurological: Negative for tremors and numbness other than noted  Psychiatric/Behavioral: Negative for decreased concentration or agitation other than above       Objective:   Physical Exam BP 128/94 mmHg  Pulse 93  Temp(Src) 98.3 F (36.8 C) (Oral)  Ht $R'5\' 4"'Cy$  (1.626 m)  Wt  220 lb 8 oz (100.018 kg)  BMI 37.83 kg/m2  SpO2 95% VS noted,  Constitutional: Pt appears well-developed, well-nourished.  HENT: Head: NCAT.  Right Ear: External ear normal.  Left Ear: External ear normal.  Bilat tm's with no erythema.  Max sinus areas non tender.  Pharynx with mild erythema, no exudate, left face without swelling, tender, rash or dysesthesia Mouth; mucosa intact, no lesions, swelling Eyes: . Pupils are equal, round, and reactive to light. Conjunctivae and EOM are normal Neck: Normal range of motion. Neck supple.  Cardiovascular: Normal rate and regular rhythm.   Pulmonary/Chest: Effort normal and breath sounds without rales or wheezing.  Neurological: Pt is alert. Not confused , motor grossly intact Skin: Skin is warm. No rash Psychiatric: Pt behavior is normal. No agitation. not depressed affect, but nervous Right first MTP with mild tender, worse to dorsiflex, slight lateral deviation noted, no redness/swelling     Assessment & Plan:

## 2014-03-14 NOTE — Assessment & Plan Note (Signed)
liekly DJD first mtp, no evidence for gout or infection, for nsaid prn,  to f/u any worsening symptoms or concerns

## 2014-03-14 NOTE — Assessment & Plan Note (Signed)
Exam benign, suspect msk pain related to mouth guard use, overall mild, reassured, cont to monitor

## 2014-03-14 NOTE — Assessment & Plan Note (Signed)
stable overall by history and exam, recent data reviewed with pt, and pt to continue medical treatment as before,  to f/u any worsening symptoms or concerns Lab Results  Component Value Date   WBC 11.5* 10/29/2013   HGB 16.7* 10/29/2013   HCT 47.4* 10/29/2013   PLT 324 10/29/2013   GLUCOSE 105* 10/29/2013   CHOL 159 10/27/2011   TRIG 62.0 10/27/2011   HDL 53.80 10/27/2011   LDLDIRECT 194.2 05/31/2007   LDLCALC 93 10/27/2011   ALT 11 08/27/2012   AST 17 08/27/2012   NA 143 10/29/2013   K 3.8 10/29/2013   CL 104 10/29/2013   CREATININE 0.96 10/29/2013   BUN 13 10/29/2013   CO2 25 10/29/2013   TSH 2.681 09/11/2013   HGBA1C 5.2 08/27/2012

## 2014-03-14 NOTE — Assessment & Plan Note (Signed)
stable overall by history and exam, recent data reviewed with pt, and pt to continue medical treatment as before,  to f/u any worsening symptoms or concerns BP Readings from Last 3 Encounters:  03/13/14 128/94  02/06/14 120/72  01/06/14 118/64

## 2014-03-17 ENCOUNTER — Other Ambulatory Visit: Payer: Medicare Other

## 2014-03-19 ENCOUNTER — Ambulatory Visit
Admission: RE | Admit: 2014-03-19 | Discharge: 2014-03-19 | Disposition: A | Discharge status: Home / Self Care | Payer: Medicare Other | Source: Ambulatory Visit | Attending: Gynecology | Admitting: Gynecology

## 2014-03-19 DIAGNOSIS — N63 Unspecified lump in unspecified breast: Secondary | ICD-10-CM

## 2014-03-19 DIAGNOSIS — N644 Mastodynia: Secondary | ICD-10-CM

## 2014-04-02 ENCOUNTER — Ambulatory Visit: Payer: Medicare Other | Admitting: Internal Medicine

## 2014-04-08 ENCOUNTER — Telehealth: Payer: Self-pay | Admitting: Hematology and Oncology

## 2014-04-08 NOTE — Telephone Encounter (Signed)
returned call and conrfirmed appt with pt....no earlier appt available

## 2014-04-09 ENCOUNTER — Other Ambulatory Visit: Payer: Self-pay | Admitting: Gynecology

## 2014-04-09 ENCOUNTER — Telehealth: Payer: Self-pay | Admitting: Hematology and Oncology

## 2014-04-09 NOTE — Telephone Encounter (Signed)
returned call adn advised pt on new appt date and time....pt ok and aware

## 2014-04-15 ENCOUNTER — Other Ambulatory Visit (HOSPITAL_BASED_OUTPATIENT_CLINIC_OR_DEPARTMENT_OTHER): Payer: Medicare Other

## 2014-04-15 ENCOUNTER — Ambulatory Visit (HOSPITAL_BASED_OUTPATIENT_CLINIC_OR_DEPARTMENT_OTHER): Payer: Medicare Other | Admitting: Hematology and Oncology

## 2014-04-15 ENCOUNTER — Ambulatory Visit (INDEPENDENT_AMBULATORY_CARE_PROVIDER_SITE_OTHER): Payer: Medicare Other | Admitting: Gastroenterology

## 2014-04-15 ENCOUNTER — Encounter: Payer: Self-pay | Admitting: Gastroenterology

## 2014-04-15 ENCOUNTER — Encounter: Payer: Self-pay | Admitting: Hematology and Oncology

## 2014-04-15 VITALS — BP 118/80 | HR 72 | Ht 64.0 in | Wt 224.1 lb

## 2014-04-15 VITALS — BP 137/68 | HR 72 | Temp 98.3°F | Resp 20 | Ht 64.0 in | Wt 224.0 lb

## 2014-04-15 DIAGNOSIS — D751 Secondary polycythemia: Secondary | ICD-10-CM | POA: Diagnosis not present

## 2014-04-15 DIAGNOSIS — R600 Localized edema: Secondary | ICD-10-CM

## 2014-04-15 DIAGNOSIS — K589 Irritable bowel syndrome without diarrhea: Secondary | ICD-10-CM

## 2014-04-15 DIAGNOSIS — R609 Edema, unspecified: Secondary | ICD-10-CM | POA: Diagnosis not present

## 2014-04-15 DIAGNOSIS — K219 Gastro-esophageal reflux disease without esophagitis: Secondary | ICD-10-CM | POA: Diagnosis not present

## 2014-04-15 DIAGNOSIS — R0789 Other chest pain: Secondary | ICD-10-CM | POA: Diagnosis not present

## 2014-04-15 DIAGNOSIS — K5900 Constipation, unspecified: Secondary | ICD-10-CM | POA: Diagnosis not present

## 2014-04-15 LAB — CBC & DIFF AND RETIC
BASO%: 0.1 % (ref 0.0–2.0)
Basophils Absolute: 0 10*3/uL (ref 0.0–0.1)
EOS%: 0.5 % (ref 0.0–7.0)
Eosinophils Absolute: 0 10*3/uL (ref 0.0–0.5)
HEMATOCRIT: 45.3 % (ref 34.8–46.6)
HEMOGLOBIN: 15.4 g/dL (ref 11.6–15.9)
Immature Retic Fract: 9.1 % (ref 1.60–10.00)
LYMPH%: 24.2 % (ref 14.0–49.7)
MCH: 30.2 pg (ref 25.1–34.0)
MCHC: 34 g/dL (ref 31.5–36.0)
MCV: 88.8 fL (ref 79.5–101.0)
MONO#: 0.6 10*3/uL (ref 0.1–0.9)
MONO%: 7.3 % (ref 0.0–14.0)
NEUT%: 67.9 % (ref 38.4–76.8)
NEUTROS ABS: 5.5 10*3/uL (ref 1.5–6.5)
Platelets: 248 10*3/uL (ref 145–400)
RBC: 5.1 10*6/uL (ref 3.70–5.45)
RDW: 13.9 % (ref 11.2–14.5)
Retic %: 2.06 % (ref 0.70–2.10)
Retic Ct Abs: 105.06 10*3/uL — ABNORMAL HIGH (ref 33.70–90.70)
WBC: 8.1 10*3/uL (ref 3.9–10.3)
lymph#: 2 10*3/uL (ref 0.9–3.3)

## 2014-04-15 MED ORDER — LANSOPRAZOLE 30 MG PO CPDR: 30.0000 mg | DELAYED_RELEASE_CAPSULE | Freq: Two times a day (BID) | ORAL | Status: DC

## 2014-04-15 MED ORDER — DICYCLOMINE HCL 10 MG PO CAPS: 10.0000 mg | ORAL_CAPSULE | Freq: Three times a day (TID) | ORAL | Status: DC

## 2014-04-15 NOTE — Assessment & Plan Note (Addendum)
She had extensive evaluation last year. Peripheral blood draw JAK2 mutation was negative. The patient had diagnosis of severe obstructive sleep apnea but does not use her CPAP machine. I think this is the most likely cause of her secondary erythrocytosis. Would not recommend phlebotomy due to her symptoms of severe fatigue. Since she had mouth guard put in, her polycythemia has resolved. I recommend close follow-up with PCP only. If her hemoglobin start trending up again, we will consider phlebotomy, only if hematocrit is greater than 48%. In the meantime, she will continue aspirin therapy to prevent risk of blood clots

## 2014-04-15 NOTE — Assessment & Plan Note (Signed)
She has nausea sensation which I think could be related to GERD. Her PCP has recently increased the dose of anti-acids. I recommend the patient to reduce the use of nonsteroidal anti-inflammatory medications.

## 2014-04-15 NOTE — Assessment & Plan Note (Signed)
This is due to fluid retention related to excessive use of nonsteroidal anti-inflammatory medication. The patient will take diuretic as needed. I recommend she increase exercise/walking activity that might help to reduce her fluid retention.

## 2014-04-15 NOTE — Progress Notes (Signed)
[facility] OFFICE PROGRESS NOTE  Cathlean Cower, MD SUMMARY OF HEMATOLOGIC HISTORY: Chronic erythrocytosis  HISTORY OF PRESENTING ILLNESS:  [name] 64 y.o. female was transferred to my care after her prior physician has left.  I reviewed the patient's records extensive and collaborated the history with the patient. Summary of her history is as follows: I review her records date back to 2009 and she has chronic erythrocytosis ever since then. In the past, she has seen another hematologist. The patient had chronic untreated obstructive sleep apnea. She has a CPAP machine but she does not use it because she cannot tolerate the mask. She denies history of blood clots. She had used testosterone in the past but not recently. Peripheral blood for JAK2 mutation was negative. She has chronic fatigue and chronic migraine headaches. She was being observed. INTERVAL HISTORY: [name] 64 y.o. female returns for further follow-up. She denies recent headaches. She complained of chronic fatigue. Consider prescribed CPAP, she was prescribed mouth guard that seems to help with her breathing. She has been taking a lot of ibuprofen lately due to history mucositis and shingles. She complained of some left flank pain that comes on and off over the last 2 months which she rated as 5 out of 10. She complained of mild swelling and occasional sensation of nausea. She denies recent diagnosis of blood clot  I have reviewed the past medical history, past surgical history, social history and family history with the patient and they are unchanged from previous note.  ALLERGIES:  is allergic to celecoxib; codeine; duloxetine; erythromycin; escitalopram oxalate; ezetimibe; iohexol; milnacipran; pregabalin; rofecoxib; statins; and tramadol.  MEDICATIONS:  Current Outpatient Prescriptions  Medication Sig Dispense Refill  . albuterol (PROVENTIL HFA;VENTOLIN HFA) 108 (90 BASE) MCG/ACT inhaler  Inhale 2 puffs into the lungs every 6 (six) hours as needed for wheezing. For wheezing 3.7 g 11  . aspirin EC 81 MG EC tablet Take 1 tablet (81 mg total) by mouth daily. 30 tablet 0  . Calcium Carbonate-Vitamin D (CALCIUM + D PO) Take 1 tablet by mouth daily.    . cycloSPORINE (RESTASIS) 0.05 % ophthalmic emulsion Place 1 drop into both eyes 2 (two) times daily.    Marland Kitchen dicyclomine (BENTYL) 10 MG capsule Take 1 capsule (10 mg total) by mouth 4 (four) times daily -  before meals and at bedtime. As needed 120 capsule 11  . FEMRING 0.1 MG/24HR RING PLACE 1 RING IN THE VAGINA EVERY 3 MONTHS 1 each 0  . fentaNYL (DURAGESIC - DOSED MCG/HR) 25 MCG/HR Place 1 patch onto the skin every 3 (three) days.    . fluticasone (FLONASE) 50 MCG/ACT nasal spray Place 2 sprays into both nostrils as needed. 16 g 11  . hydrochlorothiazide (HYDRODIURIL) 25 MG tablet Take 25 mg by mouth daily as needed (for edema).    Marland Kitchen HYDROcodone-acetaminophen (NORCO/VICODIN) 5-325 MG per tablet Take 1 tablet by mouth every 6 (six) hours as needed for moderate pain. 60 tablet 0  . HYDROcodone-homatropine (HYCODAN) 5-1.5 MG/5ML syrup Take 5 mLs by mouth every 6 (six) hours as needed for cough. 180 mL 0  . ibuprofen (ADVIL,MOTRIN) 600 MG tablet Take 1 tablet (600 mg total) by mouth every 8 (eight) hours as needed. 60 tablet 1  . lansoprazole (PREVACID) 30 MG capsule Take 1 capsule (30 mg total) by mouth 2 (two) times daily before a meal. 60 capsule 11  . lidocaine (LIDODERM) 5 % Place 1 patch onto the skin  as needed.     . nystatin (MYCOSTATIN) 100000 UNIT/ML suspension Take 5 mLs (500,000 Units total) by mouth 4 (four) times daily. (Patient taking differently: Take 5 mLs by mouth as needed. ) 240 mL 0  . Polyethyl Glycol-Propyl Glycol (SYSTANE) 0.4-0.3 % GEL Place 1 application into both eyes daily.    . polyethylene glycol powder (MIRALAX) powder Take 17 g by mouth as needed. For constipation    . thyroid (ARMOUR) 30 MG tablet Take 1 tablet  (30 mg total) by mouth daily before breakfast. 90 tablet 3  . UNABLE TO FIND Compound Drug - for PH balance yeast infection; Pt takes 2 capsules, by mouth, each week as needed  0  . zonisamide (ZONEGRAN) 25 MG capsule Take 75 mg by mouth daily.     No current facility-administered medications for this visit.     REVIEW OF SYSTEMS:   Constitutional: Denies fevers, chills or night sweats Eyes: Denies blurriness of vision Ears, nose, mouth, throat, and face: Denies mucositis or sore throat Respiratory: Denies cough, dyspnea or wheezes Cardiovascular: Denies palpitation, chest discomfort  Skin: Denies abnormal skin rashes Lymphatics: Denies new lymphadenopathy or easy bruising Neurological:Denies numbness, tingling or new weaknesses Behavioral/Psych: Mood is stable, no new changes  All other systems were reviewed with the patient and are negative.  PHYSICAL EXAMINATION: ECOG PERFORMANCE STATUS: 1 - Symptomatic but completely ambulatory  Filed Vitals:   04/15/14 1344  BP: 137/68  Pulse: 72  Temp: 98.3 F (36.8 C)  Resp: 20   Filed Weights   04/15/14 1344  Weight: 224 lb (101.606 kg)    GENERAL:alert, no distress and comfortable. She is morbidly obese SKIN: skin color, texture, turgor are normal, no rashes or significant lesions EYES: normal, Conjunctiva are pink and non-injected, sclera clear OROPHARYNX:no exudate, no erythema and lips, buccal mucosa, and tongue normal  NECK: supple, thyroid normal size, non-tender, without nodularity LYMPH:  no palpable lymphadenopathy in the cervical, axillary or inguinal LUNGS: clear to auscultation and percussion with normal breathing effort HEART: regular rate & rhythm and no murmurs with mild bilateral lower extremity edema ABDOMEN:abdomen soft, non-tender and normal bowel sounds Musculoskeletal:no cyanosis of digits and no clubbing . No chest wall tenderness on palpation on the left flank region NEURO: alert & oriented x 3 with fluent  speech, no focal motor/sensory deficits  LABORATORY DATA:  I have reviewed the data as listed Results for orders placed or performed in visit on 04/15/14 (from the past 48 hour(s))  CBC & Diff and Retic     Status: Abnormal   Collection Time: 04/15/14  1:25 PM  Result Value Ref Range   WBC 8.1 3.9 - 10.3 10e3/uL   NEUT# 5.5 1.5 - 6.5 10e3/uL   HGB 15.4 11.6 - 15.9 g/dL   HCT 45.3 34.8 - 46.6 %   Platelets 248 145 - 400 10e3/uL   MCV 88.8 79.5 - 101.0 fL   MCH 30.2 25.1 - 34.0 pg   MCHC 34.0 31.5 - 36.0 g/dL   RBC 5.10 3.70 - 5.45 10e6/uL   RDW 13.9 11.2 - 14.5 %   lymph# 2.0 0.9 - 3.3 10e3/uL   MONO# 0.6 0.1 - 0.9 10e3/uL   Eosinophils Absolute 0.0 0.0 - 0.5 10e3/uL   Basophils Absolute 0.0 0.0 - 0.1 10e3/uL   NEUT% 67.9 38.4 - 76.8 %   LYMPH% 24.2 14.0 - 49.7 %   MONO% 7.3 0.0 - 14.0 %   EOS% 0.5 0.0 - 7.0 %  BASO% 0.1 0.0 - 2.0 %   Retic % 2.06 0.70 - 2.10 %   Retic Ct Abs 105.06 (H) 33.70 - 90.70 10e3/uL   Immature Retic Fract 9.10 1.60 - 10.00 %    Lab Results  Component Value Date   WBC 8.1 04/15/2014   HGB 15.4 04/15/2014   HCT 45.3 04/15/2014   MCV 88.8 04/15/2014   PLT 248 04/15/2014   ASSESSMENT & PLAN:  Polycythemia, secondary She had extensive evaluation last year. Peripheral blood draw JAK2 mutation was negative. The patient had diagnosis of severe obstructive sleep apnea but does not use her CPAP machine. I think this is the most likely cause of her secondary erythrocytosis. Would not recommend phlebotomy due to her symptoms of severe fatigue. Since she had mouth guard put in, her polycythemia has resolved. I recommend close follow-up with PCP only. If her hemoglobin start trending up again, we will consider phlebotomy, only if hematocrit is greater than 48%. In the meantime, she will continue aspirin therapy to prevent risk of blood clots   GERD She has nausea sensation which I think could be related to GERD. Her PCP has recently increased the dose of  anti-acids. I recommend the patient to reduce the use of nonsteroidal anti-inflammatory medications.   Bilateral leg edema This is due to fluid retention related to excessive use of nonsteroidal anti-inflammatory medication. The patient will take diuretic as needed. I recommend she increase exercise/walking activity that might help to reduce her fluid retention.    All questions were answered. The patient knows to call the clinic with any problems, questions or concerns. No barriers to learning was detected.  I spent 15 minutes counseling the patient face to face. The total time spent in the appointment was 20 minutes and more than 50% was on counseling.     Alvy Bimler, Aariana Shankland, MD 3/23/20162:51 PM

## 2014-04-15 NOTE — Patient Instructions (Signed)
Increase your Prevacid to 30 mg one tablet by mouth twice daily. A new prescription has been sent to your pharmacy.   We have sent the following medications to your pharmacy for you to pick up at your convenience:dicyclomine.   Take your Miralax 1-2 x daily every day.  Patient advised to avoid spicy, acidic, citrus, chocolate, mints, fruit and fruit juices.  Limit the intake of caffeine, alcohol and Soda.  Don't exercise too soon after eating.  Don't lie down within 3-4 hours of eating.  Elevate the head of your bed.  Thank you for choosing me and [facility].  Pricilla Riffle. Dagoberto Ligas., MD., Marval Regal

## 2014-04-15 NOTE — Progress Notes (Signed)
History of Present Illness: This is a 64 year old female with multiple complaints. She has a history of IBS C, GERD and musculoskeletal chest pain. She complains of constipation and crampy abdominal pain that is generally well controlled with MiraLAX and dicyclomine. She has chronic GERD controlled on Prevacid however recently she has had frequent nausea and regurgitation symptoms. She complains of frequent sharp left lower chest pain. These symptoms do not change with any digestive function.  Allergies  Allergen Reactions  . Celecoxib Nausea Only and Nausea And Vomiting  . Codeine Nausea Only    REACTION: Nausea  . Duloxetine Nausea And Vomiting    REACTION: n/v REACTION: n/v  . Erythromycin     REACTION: Itching  . Escitalopram Oxalate Nausea Only  . Ezetimibe Nausea And Vomiting  . Iohexol Nausea And Vomiting     Desc: VIOX, CELEBREX, EMYCIN  Desc: VIOX, CELEBREX, EMYCIN   . Milnacipran Nausea And Vomiting    REACTION: n/v REACTION: n/v  . Pregabalin Itching and Nausea And Vomiting    REACTION: wt gain REACTION: wt gain  . Rofecoxib Nausea And Vomiting  . Statins Nausea And Vomiting    Body aches  . Tramadol Nausea Only   Outpatient Prescriptions Prior to Visit  Medication Sig Dispense Refill  . albuterol (PROVENTIL HFA;VENTOLIN HFA) 108 (90 BASE) MCG/ACT inhaler Inhale 2 puffs into the lungs every 6 (six) hours as needed for wheezing. For wheezing 3.7 g 11  . aspirin EC 81 MG EC tablet Take 1 tablet (81 mg total) by mouth daily. 30 tablet 0  . Calcium Carbonate-Vitamin D (CALCIUM + D PO) Take 1 tablet by mouth daily.    . cycloSPORINE (RESTASIS) 0.05 % ophthalmic emulsion Place 1 drop into both eyes 2 (two) times daily.    Marland Kitchen FEMRING 0.1 MG/24HR RING PLACE 1 RING IN THE VAGINA EVERY 3 MONTHS 1 each 0  . fentaNYL (DURAGESIC - DOSED MCG/HR) 25 MCG/HR Place 1 patch onto the skin every 3 (three) days.    . fluticasone (FLONASE) 50 MCG/ACT nasal spray Place 2 sprays into both  nostrils as needed. 16 g 11  . hydrochlorothiazide (HYDRODIURIL) 25 MG tablet Take 25 mg by mouth daily as needed (for edema).    Marland Kitchen HYDROcodone-acetaminophen (NORCO/VICODIN) 5-325 MG per tablet Take 1 tablet by mouth every 6 (six) hours as needed for moderate pain. 60 tablet 0  . HYDROcodone-homatropine (HYCODAN) 5-1.5 MG/5ML syrup Take 5 mLs by mouth every 6 (six) hours as needed for cough. 180 mL 0  . ibuprofen (ADVIL,MOTRIN) 600 MG tablet Take 1 tablet (600 mg total) by mouth every 8 (eight) hours as needed. 60 tablet 1  . lansoprazole (PREVACID) 30 MG capsule Take 30 mg by mouth daily.    Marland Kitchen nystatin (MYCOSTATIN) 100000 UNIT/ML suspension Take 5 mLs (500,000 Units total) by mouth 4 (four) times daily. (Patient taking differently: Take 5 mLs by mouth as needed. ) 240 mL 0  . Polyethyl Glycol-Propyl Glycol (SYSTANE) 0.4-0.3 % GEL Place 1 application into both eyes daily.    . polyethylene glycol powder (MIRALAX) powder Take 17 g by mouth as needed. For constipation    . thyroid (ARMOUR) 30 MG tablet Take 1 tablet (30 mg total) by mouth daily before breakfast. 90 tablet 3  . zonisamide (ZONEGRAN) 25 MG capsule Take 75 mg by mouth daily.    . predniSONE (DELTASONE) 10 MG tablet 3 tabs by mouth per day for 3 days,2tabs per day for 3 days,1tab per day  for 3 days 18 tablet 0  . valACYclovir (VALTREX) 1000 MG tablet Take 1 tablet 3 times daily 40 tablet 2  . dicyclomine (BENTYL) 10 MG capsule Take 1 capsule (10 mg total) by mouth 4 (four) times daily -  before meals and at bedtime. As needed 120 capsule 5   No facility-administered medications prior to visit.   Past Medical History  Diagnosis Date  . Endometriosis   . Osteopenia   . Asthma   . Fibromyalgia   . Thyroid disease     Hypothyroid  . Elevated cholesterol   . Migraines   . Bowel obstruction   . GERD (gastroesophageal reflux disease)   . Impaired glucose tolerance 02/25/2011  . Complication of anesthesia     "I have the shakes"  .  Heart murmur     mvp  . Shortness of breath     with exertion  . Arthritis   . Low back pain   . Hx of oral aphthous ulcers   . Biliary dyskinesia   . Diverticulosis   . Nocturia   . Hypothyroidism     hx nodule on thyroid   . Sleep apnea     has not used c-pap x 1 yr.   . Depression   . Cervical dysplasia   . Polycythemia vera   . BRCA negative 02/2013  . Hypertension    Past Surgical History  Procedure Laterality Date  . Tonsillectomy    . Pelvic laparoscopy  1995,2002    DL LSO DL RSO  . Vaginal hysterectomy  1983  . Oophorectomy      LSO-BSO  . Breast surgery      Benign breast cyst  . Cholecystectomy  12/05/2011    Procedure: LAPAROSCOPIC CHOLECYSTECTOMY;  Surgeon: Ralene Ok, MD;  Location: [facility];  Service: General;  Laterality: N/A;  . Colposcopy    . Gynecologic cryosurgery     History   Social History  . Marital Status: Single    Spouse Name: N/A  . Number of Children: 2  . Years of Education: N/A   Occupational History  . retired    Social History Main Topics  . Smoking status: Never Smoker   . Smokeless tobacco: Never Used  . Alcohol Use: 0.0 oz/week     Comment: socially  . Drug Use: No  . Sexual Activity: No     Comment: HYST   Other Topics Concern  . None   Social History Narrative   Family History  Problem Relation Age of Onset  . Hypertension Mother   . Heart disease Mother   . Breast cancer Mother     Age 71  . Cancer Mother     Bonecancer  . Hypertension Father   . Heart disease Father   . Diabetes Father   . Colon cancer Sister   . Sarcoidosis Sister   . Cancer Sister     colon  . Breast cancer Maternal Grandmother     Age 85  . Ovarian cancer Paternal Grandmother   . Breast cancer Paternal Grandmother     Age unknown  . Sarcoidosis Brother   . Breast cancer Maternal Aunt     Age 2's  . Breast cancer Maternal Aunt     Age 31  . Breast cancer Sister       Review of Systems: Pertinent positive and negative  review of systems were noted in the above HPI section. All other review of systems were otherwise negative.  Physical Exam: General: Well developed, well nourished, no acute distress Head: Normocephalic and atraumatic Eyes:  sclerae anicteric, EOMI Ears: Normal auditory acuity Mouth: No deformity or lesions Neck: Supple, no masses or thyromegaly Lungs: Clear throughout to auscultation, left lower ribs markedly tender Heart: Regular rate and rhythm; no murmurs, rubs or bruits Abdomen: Soft, non tender and non distended. No masses, hepatosplenomegaly or hernias noted. Normal Bowel sounds Musculoskeletal: Symmetrical with no gross deformities  Skin: No lesions on visible extremities Pulses:  Normal pulses noted Extremities: No clubbing, cyanosis, edema or deformities noted Neurological: Alert oriented x 4, grossly nonfocal Cervical Nodes:  No significant cervical adenopathy Inguinal Nodes: No significant inguinal adenopathy Psychological:  Alert and cooperative. Normal mood and affect   Assessment and Recommendations:  1. IBS C. Continue MiraLAX once to twice daily titrated for adequate bowel movements. Continue dicyclomine 10 mg 4 times a day as needed.  2. GERD with more frequent breakthrough symptoms. Intensify all antireflux measures and increase Prevacid to 30 mg twice daily.  3. Left lower chest wall pain. This is not gastrointestinal. Follow-up with Dr. Jenny Reichmann.  4. Family history of colon cancer. 5 Year interval colonoscopy recommended in February 2018.

## 2014-04-24 ENCOUNTER — Ambulatory Visit (INDEPENDENT_AMBULATORY_CARE_PROVIDER_SITE_OTHER): Payer: Medicare Other | Admitting: Gynecology

## 2014-04-24 ENCOUNTER — Encounter: Payer: Self-pay | Admitting: Gynecology

## 2014-04-24 VITALS — BP 130/78 | Ht 64.0 in | Wt 228.0 lb

## 2014-04-24 DIAGNOSIS — N760 Acute vaginitis: Secondary | ICD-10-CM | POA: Diagnosis not present

## 2014-04-24 DIAGNOSIS — Z01419 Encounter for gynecological examination (general) (routine) without abnormal findings: Secondary | ICD-10-CM

## 2014-04-24 DIAGNOSIS — Z79890 Hormone replacement therapy (postmenopausal): Secondary | ICD-10-CM

## 2014-04-24 MED ORDER — ESTRADIOL ACETATE 0.05 MG/24HR VA RING: VAGINAL_RING | VAGINAL | Status: DC

## 2014-04-24 NOTE — Patient Instructions (Signed)
Office will contact you to arrange appointment with genetic counselor as far as determining personal breast cancer risk and MRI screening recommendation. If you do not hear from the office in several weeks call.  Try the lower dose of vaginal estrogen ring call if issues with this. Ultimately I would like you to wean off of the estrogen if you can.  Use the boric acid suppositories as needed for yeast.  Follow up with your gastroenterologist as far as the left lower quadrant pain.  You may obtain a copy of any labs that were done today by logging onto MyChart as outlined in the instructions provided with your AVS (after visit summary). The office will not call with normal lab results but certainly if there are any significant abnormalities then we will contact you.   Health Maintenance, Female A healthy lifestyle and preventative care can promote health and wellness.  Maintain regular health, dental, and eye exams.  Eat a healthy diet. Foods like vegetables, fruits, whole grains, low-fat dairy products, and lean protein foods contain the nutrients you need without too many calories. Decrease your intake of foods high in solid fats, added sugars, and salt. Get information about a proper diet from your caregiver, if necessary.  Regular physical exercise is one of the most important things you can do for your health. Most adults should get at least 150 minutes of moderate-intensity exercise (any activity that increases your heart rate and causes you to sweat) each week. In addition, most adults need muscle-strengthening exercises on 2 or more days a week.   Maintain a healthy weight. The body mass index (BMI) is a screening tool to identify possible weight problems. It provides an estimate of body fat based on height and weight. Your caregiver can help determine your BMI, and can help you achieve or maintain a healthy weight. For adults 20 years and older:  A BMI below 18.5 is considered  underweight.  A BMI of 18.5 to 24.9 is normal.  A BMI of 25 to 29.9 is considered overweight.  A BMI of 30 and above is considered obese.  Maintain normal blood lipids and cholesterol by exercising and minimizing your intake of saturated fat. Eat a balanced diet with plenty of fruits and vegetables. Blood tests for lipids and cholesterol should begin at age 24 and be repeated every 5 years. If your lipid or cholesterol levels are high, you are over 50, or you are a high risk for heart disease, you may need your cholesterol levels checked more frequently.Ongoing high lipid and cholesterol levels should be treated with medicines if diet and exercise are not effective.  If you smoke, find out from your caregiver how to quit. If you do not use tobacco, do not start.  Lung cancer screening is recommended for adults aged 67 80 years who are at high risk for developing lung cancer because of a history of smoking. Yearly low-dose computed tomography (CT) is recommended for people who have at least a 30-pack-year history of smoking and are a current smoker or have quit within the past 15 years. A pack year of smoking is smoking an average of 1 pack of cigarettes a day for 1 year (for example: 1 pack a day for 30 years or 2 packs a day for 15 years). Yearly screening should continue until the smoker has stopped smoking for at least 15 years. Yearly screening should also be stopped for people who develop a health problem that would prevent them from having  lung cancer treatment.  If you are pregnant, do not drink alcohol. If you are breastfeeding, be very cautious about drinking alcohol. If you are not pregnant and choose to drink alcohol, do not exceed 1 drink per day. One drink is considered to be 12 ounces (355 mL) of beer, 5 ounces (148 mL) of wine, or 1.5 ounces (44 mL) of liquor.  Avoid use of street drugs. Do not share needles with anyone. Ask for help if you need support or instructions about stopping  the use of drugs.  High blood pressure causes heart disease and increases the risk of stroke. Blood pressure should be checked at least every 1 to 2 years. Ongoing high blood pressure should be treated with medicines, if weight loss and exercise are not effective.  If you are 15 to 62 years old, ask your caregiver if you should take aspirin to prevent strokes.  Diabetes screening involves taking a blood sample to check your fasting blood sugar level. This should be done once every 3 years, after age 108, if you are within normal weight and without risk factors for diabetes. Testing should be considered at a younger age or be carried out more frequently if you are overweight and have at least 1 risk factor for diabetes.  Breast cancer screening is essential preventative care for women. You should practice "breast self-awareness." This means understanding the normal appearance and feel of your breasts and may include breast self-examination. Any changes detected, no matter how small, should be reported to a caregiver. Women in their 96s and 30s should have a clinical breast exam (CBE) by a caregiver as part of a regular health exam every 1 to 3 years. After age 58, women should have a CBE every year. Starting at age 107, women should consider having a mammogram (breast X-ray) every year. Women who have a family history of breast cancer should talk to their caregiver about genetic screening. Women at a high risk of breast cancer should talk to their caregiver about having an MRI and a mammogram every year.  Breast cancer gene (BRCA)-related cancer risk assessment is recommended for women who have family members with BRCA-related cancers. BRCA-related cancers include breast, ovarian, tubal, and peritoneal cancers. Having family members with these cancers may be associated with an increased risk for harmful changes (mutations) in the breast cancer genes BRCA1 and BRCA2. Results of the assessment will determine  the need for genetic counseling and BRCA1 and BRCA2 testing.  The Pap test is a screening test for cervical cancer. Women should have a Pap test starting at age 55. Between ages 49 and 42, Pap tests should be repeated every 2 years. Beginning at age 8, you should have a Pap test every 3 years as long as the past 3 Pap tests have been normal. If you had a hysterectomy for a problem that was not cancer or a condition that could lead to cancer, then you no longer need Pap tests. If you are between ages 45 and 23, and you have had normal Pap tests going back 10 years, you no longer need Pap tests. If you have had past treatment for cervical cancer or a condition that could lead to cancer, you need Pap tests and screening for cancer for at least 20 years after your treatment. If Pap tests have been discontinued, risk factors (such as a new sexual partner) need to be reassessed to determine if screening should be resumed. Some women have medical problems that increase the  chance of getting cervical cancer. In these cases, your caregiver may recommend more frequent screening and Pap tests.  The human papillomavirus (HPV) test is an additional test that may be used for cervical cancer screening. The HPV test looks for the virus that can cause the cell changes on the cervix. The cells collected during the Pap test can be tested for HPV. The HPV test could be used to screen women aged 8 years and older, and should be used in women of any age who have unclear Pap test results. After the age of 52, women should have HPV testing at the same frequency as a Pap test.  Colorectal cancer can be detected and often prevented. Most routine colorectal cancer screening begins at the age of 72 and continues through age 2. However, your caregiver may recommend screening at an earlier age if you have risk factors for colon cancer. On a yearly basis, your caregiver may provide home test kits to check for hidden blood in the stool.  Use of a small camera at the end of a tube, to directly examine the colon (sigmoidoscopy or colonoscopy), can detect the earliest forms of colorectal cancer. Talk to your caregiver about this at age 69, when routine screening begins. Direct examination of the colon should be repeated every 5 to 10 years through age 32, unless early forms of pre-cancerous polyps or small growths are found.  Hepatitis C blood testing is recommended for all people born from [birth year] through 1963 and any individual with known risks for hepatitis C.  Practice safe sex. Use condoms and avoid high-risk sexual practices to reduce the spread of sexually transmitted infections (STIs). Sexually active women aged 30 and younger should be checked for Chlamydia, which is a common sexually transmitted infection. Older women with new or multiple partners should also be tested for Chlamydia. Testing for other STIs is recommended if you are sexually active and at increased risk.  Osteoporosis is a disease in which the bones lose minerals and strength with aging. This can result in serious bone fractures. The risk of osteoporosis can be identified using a bone density scan. Women ages 39 and over and women at risk for fractures or osteoporosis should discuss screening with their caregivers. Ask your caregiver whether you should be taking a calcium supplement or vitamin D to reduce the rate of osteoporosis.  Menopause can be associated with physical symptoms and risks. Hormone replacement therapy is available to decrease symptoms and risks. You should talk to your caregiver about whether hormone replacement therapy is right for you.  Use sunscreen. Apply sunscreen liberally and repeatedly throughout the day. You should seek shade when your shadow is shorter than you. Protect yourself by wearing long sleeves, pants, a wide-brimmed hat, and sunglasses year round, whenever you are outdoors.  Notify your caregiver of new moles or changes in moles,  especially if there is a change in shape or color. Also notify your caregiver if a mole is larger than the size of a pencil eraser.  Stay current with your immunizations. Document Released: 07/25/2010 Document Revised: 05/06/2012 Document Reviewed: 07/25/2010 [facility] Patient Information 2014 [place].

## 2014-04-24 NOTE — Progress Notes (Signed)
[name] 07-Jul-1950 [id number]        64 y.o.  [id number] for breast and pelvic exam. Several issues noted below.  Past medical history,surgical history, problem list, medications, allergies, family history and social history were all reviewed and documented as reviewed in the EPIC chart.  ROS:  Performed with pertinent positives and negatives included in the history, assessment and plan.   Additional significant findings :  Left lower quadrant pain as described below.   Exam: Kim assistant Filed Vitals:   04/24/14 1611  BP: 130/78  Height: _0  (1.626 m)  Weight: 228 lb (103.42 kg)   General appearance:  Normal affect, orientation and appearance. Skin: Grossly normal HEENT: Without gross lesions.  No cervical or supraclavicular adenopathy. Thyroid normal.  Lungs:  Clear without wheezing, rales or rhonchi Cardiac: RR, without RMG Abdominal:  Soft, nontender, without masses, guarding, rebound, organomegaly or hernia Breasts:  Examined lying and sitting without masses, retractions, discharge or axillary adenopathy. Pelvic:  Ext/BUS/vagina with generalized atrophic changes.  Adnexa  Without masses or tenderness    Anus and perineum  Normal   Rectovaginal  Normal sphincter tone without palpated masses or tenderness.    Assessment/Plan:  64 y.o. [name] for breast and pelvic exam.   1. Postmenopausal/HRT.  History of [id number] for dysfunctional bleeding, subsequent LSO 1995 then RSO 2002 for endometriosis and adhesions. On Femring 0.1 mg.  I again reviewed the whole issue of HRT to include increased risk of stroke heart attack DVT and possible breast cancer. The ACOG and NAMS recommendations for lowest dose for shortest period of time reviewed. The issues of weaning discussed. She is being followed for polycythemia vera with most recent hemoglobin 15. Possible increased risk of thrombosis associated with this and ERT.  Very strong family history of breast cancer. Sickle cell  trait. I recommended the patient try to wean off of her ERT. Suggest that we try the 0.05 Femring and see how she does with this. Assuming she does well she'll continue it for now and then ultimately will try to wean off of this and will see how she does. She will call me if she has any issues with this. I discussed my concerns as far as thrombosis and she understands this and will try to wean off of this tolerating some mild hot flushes if they occur. 2. Left lower quadrant pain. Patient notes nagging left lower quadrant pain has been going on for "years".  This being followed for a number of GI issues and recently saw her gastroenterologist. Reviewed with patient she does have a history of endometriosis but highly unlikely an etiology for her pain given she is status post TVH, LSO and RSO in the past. Exam today is normal.  My recommendation is to follow up with her GI physician in reference to this and allow him to evaluate this in order any testing that he would seem appropriate i.e. CT scanning as he feels appropriate. 3. History of recurrent yeast infections. Uses boric acid suppositories prophylactically twice weekly which work. Will refill boric acid 600 mg twice weekly as needed for her. 4. History of osteopenia. DEXA 2012 showed T score -1.3. Apparently received one dose of IV Reclast.  Most recent DEXA 04/2013 normal. Plan repeat DEXA next year a two-year interval. Increased calcium and vitamin D reviewed. 5. Pap smear 12/2011 negative.  No Pap smear done today.  Status post hysterectomy for benign indications. Dr. Valeta Harms note had some question of dysplasia and  whether it was before or after her hysterectomy. Regardless her Pap smears have been normal since.  Will plan on screening next year at three-year interval. 6. 3-D mammography 02/2014. Very strong family history of breast cancer to include mother premenopausal, maternal grandmother, 2 maternal aunts and now her sister recently diagnosed.  Had BRCA testing done last year and negative. Reviewed with her that this does not erase her risk of breast cancer based on family history. The question is whether to do screening MRIs along with her mammography. She has had an MRI done 01/2013. I reviewed the issues of cost and insurance coverage. I recommended that she follow up with a genetic counselor to determine her risk of breast cancer and if it exceeds 20% per [place] that we should strongly consider MRIs along with her mammography. Patient agrees with this and we will arrange an appointment. She knows that she will hear from our office and if not she will call me back in several weeks. She'll continue with her annual mammographies and SBE monthly. 7. Colonoscopy 2013. Repeat at their recommended interval. 8. Health maintenance. No routine blood work done as this is done at her primary physician's office. Follow up as above otherwise annually.     Anastasio Auerbach MD, 5:13 PM 04/24/2014

## 2014-04-27 ENCOUNTER — Telehealth: Payer: Self-pay | Admitting: *Deleted

## 2014-04-27 ENCOUNTER — Telehealth: Payer: Self-pay | Admitting: Genetic Counselor

## 2014-04-27 MED ORDER — NONFORMULARY OR COMPOUNDED ITEM: Status: DC

## 2014-04-27 NOTE — Telephone Encounter (Signed)
-----   Message from Anastasio Auerbach, MD sent at 04/24/2014  5:26 PM EDT ----- [facility] boric acid suppositories 600 mg #30, one twice weekly as needed, refill 3

## 2014-04-27 NOTE — Telephone Encounter (Signed)
-----  Message from Anastasio Auerbach, MD sent at 04/24/2014  5:09 PM EDT ----- Schedule an appointment with [facility] genetic counselor reference patient with breast cancer history in mother premenopausal, maternal grandmother, 2 maternal aunts and now sister. Reference calculate personal lifetime risk of breast cancer and recommendation as far as MRI screening. Had been BRCA tested and negative.

## 2014-04-27 NOTE — Telephone Encounter (Signed)
GENETIC APPT-S/W PATIENT AND GAVE GENETIC APPT FOR 04/20 @ 2 Shari Prows POWELL REFERRING DR. Phineas Real [facility] OF BREAST CA

## 2014-04-27 NOTE — Telephone Encounter (Signed)
Rx called in 

## 2014-04-27 NOTE — Telephone Encounter (Signed)
Referral faxed to [facility], they will contact pt to schedule.

## 2014-04-29 ENCOUNTER — Other Ambulatory Visit (INDEPENDENT_AMBULATORY_CARE_PROVIDER_SITE_OTHER): Payer: Medicare Other

## 2014-04-29 ENCOUNTER — Ambulatory Visit (INDEPENDENT_AMBULATORY_CARE_PROVIDER_SITE_OTHER): Payer: Medicare Other | Admitting: Internal Medicine

## 2014-04-29 VITALS — BP 124/78 | HR 82 | Temp 98.8°F | Resp 18 | Ht 64.0 in | Wt 230.8 lb

## 2014-04-29 DIAGNOSIS — Z0000 Encounter for general adult medical examination without abnormal findings: Secondary | ICD-10-CM

## 2014-04-29 DIAGNOSIS — R609 Edema, unspecified: Secondary | ICD-10-CM

## 2014-04-29 DIAGNOSIS — E785 Hyperlipidemia, unspecified: Secondary | ICD-10-CM | POA: Diagnosis not present

## 2014-04-29 DIAGNOSIS — I10 Essential (primary) hypertension: Secondary | ICD-10-CM | POA: Diagnosis not present

## 2014-04-29 DIAGNOSIS — Z79899 Other long term (current) drug therapy: Secondary | ICD-10-CM | POA: Diagnosis not present

## 2014-04-29 LAB — CBC WITH DIFFERENTIAL/PLATELET
BASOS PCT: 0.5 % (ref 0.0–3.0)
Basophils Absolute: 0 10*3/uL (ref 0.0–0.1)
EOS PCT: 1.1 % (ref 0.0–5.0)
Eosinophils Absolute: 0.1 10*3/uL (ref 0.0–0.7)
HCT: 46.9 % — ABNORMAL HIGH (ref 36.0–46.0)
Hemoglobin: 15.4 g/dL — ABNORMAL HIGH (ref 12.0–15.0)
LYMPHS ABS: 2.1 10*3/uL (ref 0.7–4.0)
LYMPHS PCT: 24.5 % (ref 12.0–46.0)
MCHC: 32.8 g/dL (ref 30.0–36.0)
MCV: 89.7 fl (ref 78.0–100.0)
Monocytes Absolute: 0.5 10*3/uL (ref 0.1–1.0)
Monocytes Relative: 6.1 % (ref 3.0–12.0)
NEUTROS ABS: 5.7 10*3/uL (ref 1.4–7.7)
NEUTROS PCT: 67.8 % (ref 43.0–77.0)
PLATELETS: 323 10*3/uL (ref 150.0–400.0)
RBC: 5.23 Mil/uL — ABNORMAL HIGH (ref 3.87–5.11)
RDW: 14 % (ref 11.5–15.5)
WBC: 8.4 10*3/uL (ref 4.0–10.5)

## 2014-04-29 LAB — LIPID PANEL
CHOLESTEROL: 212 mg/dL — AB (ref 0–200)
HDL: 48 mg/dL (ref >39.00)
LDL Cholesterol: 138 mg/dL — ABNORMAL HIGH (ref 0–99)
NonHDL: 164
Total CHOL/HDL Ratio: 4
Triglycerides: 129 mg/dL (ref 0.0–149.0)
VLDL: 25.8 mg/dL (ref 0.0–40.0)

## 2014-04-29 LAB — URINALYSIS, ROUTINE W REFLEX MICROSCOPIC
Bilirubin Urine: NEGATIVE
HGB URINE DIPSTICK: NEGATIVE
KETONES UR: NEGATIVE
Leukocytes, UA: NEGATIVE
Nitrite: NEGATIVE
PH: 5.5 (ref 5.0–8.0)
SPECIFIC GRAVITY, URINE: 1.02 (ref 1.000–1.030)
Total Protein, Urine: NEGATIVE
Urine Glucose: NEGATIVE
Urobilinogen, UA: 0.2 (ref 0.0–1.0)

## 2014-04-29 LAB — HEPATIC FUNCTION PANEL
ALT: 13 U/L (ref 0–35)
AST: 16 U/L (ref 0–37)
Albumin: 3.4 g/dL — ABNORMAL LOW (ref 3.5–5.2)
Alkaline Phosphatase: 52 U/L (ref 39–117)
BILIRUBIN DIRECT: 0.1 mg/dL (ref 0.0–0.3)
BILIRUBIN TOTAL: 0.4 mg/dL (ref 0.2–1.2)
Total Protein: 6.9 g/dL (ref 6.0–8.3)

## 2014-04-29 LAB — BASIC METABOLIC PANEL
BUN: 12 mg/dL (ref 6–23)
CALCIUM: 9 mg/dL (ref 8.4–10.5)
CO2: 34 mEq/L — ABNORMAL HIGH (ref 19–32)
Chloride: 103 mEq/L (ref 96–112)
Creatinine, Ser: 1.06 mg/dL (ref 0.40–1.20)
GFR: 67.67 mL/min (ref >60.00)
GLUCOSE: 74 mg/dL (ref 70–99)
POTASSIUM: 3.8 meq/L (ref 3.5–5.1)
Sodium: 139 mEq/L (ref 135–145)

## 2014-04-29 LAB — TSH: TSH: 2.1 u[IU]/mL (ref 0.35–4.50)

## 2014-04-29 MED ORDER — HYDROCHLOROTHIAZIDE 25 MG PO TABS: 25.0000 mg | ORAL_TABLET | Freq: Every day | ORAL | Status: DC | PRN

## 2014-04-29 NOTE — Telephone Encounter (Signed)
Appointment 05/13/14 @ 2:00pm

## 2014-04-29 NOTE — Progress Notes (Signed)
Subjective:    Patient ID: [name], female    DOB: 1950-06-17, 64 y.o.   MRN: [id number]  HPI  Here for wellness and f/u;  Overall doing ok;  Pt denies worsening SOB, DOE, wheezing, orthopnea, PND, palpitations, dizziness or syncope, but has had mild LE edema for 1 mo.Has occas ant CP's, neg stress pharm in last 42yrs per pt.  No change uch as new headache, facial or extremity weakness.  Pt denies polydipsia, polyuria, or low sugar symptoms. Pt states overall good compliance with treatment and medications, good tolerability, and has been trying to follow appropriate diet.  Pt denies worsening depressive symptoms, suicidal ideation or panic. No fever, night sweats, wt loss, loss of appetite, or other constitutional symptoms.  Pt states good ability with ADL's, has low fall risk, home safety reviewed and adequate, no other significant changes in hearing or vision, and only occasionally active with exercise. Hs had borderline low sat 91% recent, hsa been fitted for OSA mouth guard, wearing now.  Has had some trouble getting to sleep most nights. C/o fatigue. , tired, weak, no enegy, cont' to gain wt, she estimates 30 lbs since nov 2015.   Wt Readings from Last 3 Encounters:  04/29/14 230 lb 12.8 oz (104.69 kg)  04/24/14 228 lb (103.42 kg)  04/15/14 224 lb (101.606 kg)  Has seen GI as well with antacid and bentyl rx.  Past Medical History  Diagnosis Date  . Endometriosis   . Asthma   . Fibromyalgia   . Thyroid disease     Hypothyroid  . Elevated cholesterol   . Migraines   . Bowel obstruction   . GERD (gastroesophageal reflux disease)   . Impaired glucose tolerance 02/25/2011  . Complication of anesthesia     "I have the shakes"  . Heart murmur     mvp  . Shortness of breath     with exertion  . Arthritis   . Low back pain   . Hx of oral aphthous ulcers   . Biliary dyskinesia   . Diverticulosis   . Nocturia   . Hypothyroidism     hx nodule on thyroid   . Sleep apnea     has  not used c-pap x 1 yr.   . Depression   . Cervical dysplasia   . Polycythemia vera   . BRCA negative 02/2013  . Hypertension   . Shingles   . Sickle cell trait    Past Surgical History  Procedure Laterality Date  . Tonsillectomy    . Pelvic laparoscopy  1995,2002    DL LSO DL RSO  . Vaginal hysterectomy  1983  . Breast surgery      Benign breast cyst  . Cholecystectomy  12/05/2011    Procedure: LAPAROSCOPIC CHOLECYSTECTOMY;  Surgeon: Axel Filler, MD;  Location: [facility];  Service: General;  Laterality: N/A;  . Colposcopy    . Gynecologic cryosurgery    . Oophorectomy      BSO    reports that she has never smoked. She has never used smokeless tobacco. She reports that she drinks alcohol. She reports that she does not use illicit drugs. family history includes Breast cancer in her maternal aunt, maternal aunt, maternal grandmother, mother, and paternal grandmother; Breast cancer (age of onset: 66) in her sister; Cancer in her mother and sister; Colon cancer in her sister; Diabetes in her father; Heart disease in her father and mother; Hypertension in her father and mother; Ovarian cancer  in her paternal grandmother; Sarcoidosis in her brother and sister. Allergies  Allergen Reactions  . Celecoxib Nausea Only and Nausea And Vomiting  . Codeine Nausea Only    REACTION: Nausea  . Duloxetine Nausea And Vomiting    REACTION: n/v REACTION: n/v  . Erythromycin     REACTION: Itching  . Escitalopram Oxalate Nausea Only  . Ezetimibe Nausea And Vomiting  . Iohexol Nausea And Vomiting     Desc: VIOX, CELEBREX, EMYCIN  Desc: VIOX, CELEBREX, EMYCIN   . Milnacipran Nausea And Vomiting    REACTION: n/v REACTION: n/v  . Pregabalin Itching and Nausea And Vomiting    REACTION: wt gain REACTION: wt gain  . Rofecoxib Nausea And Vomiting  . Statins Nausea And Vomiting    Body aches  . Tramadol Nausea Only   Current Outpatient Prescriptions on File Prior to Visit  Medication Sig  Dispense Refill  . albuterol (PROVENTIL HFA;VENTOLIN HFA) 108 (90 BASE) MCG/ACT inhaler Inhale 2 puffs into the lungs every 6 (six) hours as needed for wheezing. For wheezing 3.7 g 11  . aspirin EC 81 MG EC tablet Take 1 tablet (81 mg total) by mouth daily. 30 tablet 0  . Calcium Carbonate-Vitamin D (CALCIUM + D PO) Take 1 tablet by mouth daily.    . cycloSPORINE (RESTASIS) 0.05 % ophthalmic emulsion Place 1 drop into both eyes 2 (two) times daily.    Marland Kitchen dicyclomine (BENTYL) 10 MG capsule Take 1 capsule (10 mg total) by mouth 4 (four) times daily -  before meals and at bedtime. As needed 120 capsule 11  . Estradiol Acetate 0.05 MG/24HR RING 1 vaginal ring every 3 months 1 each 4  . fentaNYL (DURAGESIC - DOSED MCG/HR) 25 MCG/HR Place 1 patch onto the skin every 3 (three) days.    . fluticasone (FLONASE) 50 MCG/ACT nasal spray Place 2 sprays into both nostrils as needed. 16 g 11  . hydrochlorothiazide (HYDRODIURIL) 25 MG tablet Take 25 mg by mouth daily as needed (for edema).    Marland Kitchen HYDROcodone-acetaminophen (NORCO/VICODIN) 5-325 MG per tablet Take 1 tablet by mouth every 6 (six) hours as needed for moderate pain. 60 tablet 0  . HYDROcodone-homatropine (HYCODAN) 5-1.5 MG/5ML syrup Take 5 mLs by mouth every 6 (six) hours as needed for cough. 180 mL 0  . ibuprofen (ADVIL,MOTRIN) 600 MG tablet Take 1 tablet (600 mg total) by mouth every 8 (eight) hours as needed. 60 tablet 1  . lansoprazole (PREVACID) 30 MG capsule Take 1 capsule (30 mg total) by mouth 2 (two) times daily before a meal. 60 capsule 11  . lidocaine (LIDODERM) 5 % Place 1 patch onto the skin as needed.     . NONFORMULARY OR COMPOUNDED ITEM Boric Acid suppositories 600 mg 30 each 3  . nystatin (MYCOSTATIN) 100000 UNIT/ML suspension Take 5 mLs (500,000 Units total) by mouth 4 (four) times daily. (Patient taking differently: Take 5 mLs by mouth as needed. ) 240 mL 0  . Polyethyl Glycol-Propyl Glycol (SYSTANE) 0.4-0.3 % GEL Place 1 application  into both eyes daily.    . polyethylene glycol powder (MIRALAX) powder Take 17 g by mouth as needed. For constipation    . thyroid (ARMOUR) 30 MG tablet Take 1 tablet (30 mg total) by mouth daily before breakfast. 90 tablet 3  . zonisamide (ZONEGRAN) 25 MG capsule Take 75 mg by mouth daily.     No current facility-administered medications on file prior to visit.    Review of Systems  Objective:   Physical Exam BP 124/78 mmHg  Pulse 82  Temp(Src) 98.8 F (37.1 C) (Oral)  Resp 18  Ht $R'5\' 4"'PF$  (1.626 m)  Wt 230 lb 12.8 oz (104.69 kg)  BMI 39.60 kg/m2  SpO2 97% VS noted,  Constitutional: Pt is oriented to person, place, and time. Appears well-developed and well-nourished, in no significant distress/obeseHead: Normocephalic and atraumatic.  Right Ear: External ear normal.  Left Ear: External ear normal.  Nose: Nose normal.  Mouth/Throat: Oropharynx is clear and moist.  Eyes: Conjunctivae and EOM are normal. Pupils are equal, round, and reactive to light.  Neck: Normal range of motion. Neck supple. No JVD present. No tracheal deviation present or significant neck LA or mass Cardiovascular: Normal rate, regular rhythm, normal heart sounds and intact distal pulses.   Pulmonary/Chest: Effort normal and breath sounds without rales or wheezing  Abdominal: Soft. Bowel sounds are normal. NT. No HSM  Musculoskeletal: Normal range of motion. Exhibits trace to 1+  Edema bilat LE's.  Lymphadenopathy:  Has no cervical adenopathy.  Neurological: Pt is alert and oriented to person, place, and time. Pt has normal reflexes. No cranial nerve deficit. Motor grossly intact Skin: Skin is warm and dry. No rash noted.  Psychiatric:  Has normal mood and affect. Behavior is normal.        Assessment & Plan:

## 2014-04-29 NOTE — Patient Instructions (Addendum)
Please take all new medication as prescribed - the mild fluid pill - HCT 25 mg per day as needed for swelling  Please continue all other medications as before, and refills have been done if requested.  Please have the pharmacy call with any other refills you may need.  Please continue your efforts at being more active, low cholesterol diet, and weight control.  You are otherwise up to date with prevention measures today.  Please keep your appointments with your specialists as you may have planned  Please go to the LAB in the Basement (turn left off the elevator) for the tests to be done today  You will be contacted by phone if any changes need to be made immediately.  Otherwise, you will receive a letter about your results with an explanation, but please check with MyChart first.  Please remember to sign up for MyChart if you have not done so, as this will be important to you in the future with finding out test results, communicating by private email, and scheduling acute appointments online when needed.  Please return in 6 months, or sooner if needed

## 2014-04-29 NOTE — Assessment & Plan Note (Signed)

## 2014-04-29 NOTE — Progress Notes (Signed)
Pre visit review using our clinic review tool, if applicable. No additional management support is needed unless otherwise documented below in the visit note. 

## 2014-04-29 NOTE — Assessment & Plan Note (Signed)
For hct 12.5 mg qd prn

## 2014-04-30 ENCOUNTER — Other Ambulatory Visit: Payer: Self-pay | Admitting: Internal Medicine

## 2014-04-30 ENCOUNTER — Ambulatory Visit: Payer: Medicare Other | Admitting: Hematology and Oncology

## 2014-04-30 ENCOUNTER — Encounter: Payer: Self-pay | Admitting: Internal Medicine

## 2014-04-30 ENCOUNTER — Other Ambulatory Visit: Payer: Medicare Other

## 2014-05-09 ENCOUNTER — Encounter (HOSPITAL_COMMUNITY): Payer: Self-pay | Admitting: Emergency Medicine

## 2014-05-09 ENCOUNTER — Emergency Department (HOSPITAL_COMMUNITY)
Admission: EM | Admit: 2014-05-09 | Discharge: 2014-05-09 | Disposition: A | Discharge status: Home / Self Care | Payer: Medicare Other | Attending: Emergency Medicine | Admitting: Emergency Medicine

## 2014-05-09 ENCOUNTER — Emergency Department (HOSPITAL_COMMUNITY): Payer: Medicare Other

## 2014-05-09 DIAGNOSIS — R1012 Left upper quadrant pain: Secondary | ICD-10-CM | POA: Insufficient documentation

## 2014-05-09 DIAGNOSIS — Z7982 Long term (current) use of aspirin: Secondary | ICD-10-CM | POA: Insufficient documentation

## 2014-05-09 DIAGNOSIS — Z79899 Other long term (current) drug therapy: Secondary | ICD-10-CM | POA: Insufficient documentation

## 2014-05-09 DIAGNOSIS — R011 Cardiac murmur, unspecified: Secondary | ICD-10-CM | POA: Insufficient documentation

## 2014-05-09 DIAGNOSIS — Z8659 Personal history of other mental and behavioral disorders: Secondary | ICD-10-CM | POA: Diagnosis not present

## 2014-05-09 DIAGNOSIS — G43909 Migraine, unspecified, not intractable, without status migrainosus: Secondary | ICD-10-CM | POA: Insufficient documentation

## 2014-05-09 DIAGNOSIS — M1990 Unspecified osteoarthritis, unspecified site: Secondary | ICD-10-CM | POA: Diagnosis not present

## 2014-05-09 DIAGNOSIS — K219 Gastro-esophageal reflux disease without esophagitis: Secondary | ICD-10-CM | POA: Diagnosis not present

## 2014-05-09 DIAGNOSIS — R110 Nausea: Secondary | ICD-10-CM | POA: Insufficient documentation

## 2014-05-09 DIAGNOSIS — I10 Essential (primary) hypertension: Secondary | ICD-10-CM | POA: Insufficient documentation

## 2014-05-09 DIAGNOSIS — R109 Unspecified abdominal pain: Secondary | ICD-10-CM

## 2014-05-09 DIAGNOSIS — Z8619 Personal history of other infectious and parasitic diseases: Secondary | ICD-10-CM | POA: Insufficient documentation

## 2014-05-09 DIAGNOSIS — E039 Hypothyroidism, unspecified: Secondary | ICD-10-CM | POA: Diagnosis not present

## 2014-05-09 DIAGNOSIS — Z8742 Personal history of other diseases of the female genital tract: Secondary | ICD-10-CM | POA: Insufficient documentation

## 2014-05-09 DIAGNOSIS — J45909 Unspecified asthma, uncomplicated: Secondary | ICD-10-CM | POA: Diagnosis not present

## 2014-05-09 DIAGNOSIS — R1032 Left lower quadrant pain: Secondary | ICD-10-CM | POA: Diagnosis present

## 2014-05-09 DIAGNOSIS — Z862 Personal history of diseases of the blood and blood-forming organs and certain disorders involving the immune mechanism: Secondary | ICD-10-CM | POA: Diagnosis not present

## 2014-05-09 LAB — URINALYSIS, ROUTINE W REFLEX MICROSCOPIC
Bilirubin Urine: NEGATIVE
GLUCOSE, UA: NEGATIVE mg/dL
HGB URINE DIPSTICK: NEGATIVE
Ketones, ur: NEGATIVE mg/dL
LEUKOCYTES UA: NEGATIVE
Nitrite: NEGATIVE
Protein, ur: NEGATIVE mg/dL
SPECIFIC GRAVITY, URINE: 1.018 (ref 1.005–1.030)
UROBILINOGEN UA: 1 mg/dL (ref 0.0–1.0)
pH: 5.5 (ref 5.0–8.0)

## 2014-05-09 LAB — COMPREHENSIVE METABOLIC PANEL
ALT: 9 U/L (ref 0–35)
AST: 17 U/L (ref 0–37)
Albumin: 3.3 g/dL — ABNORMAL LOW (ref 3.5–5.2)
Alkaline Phosphatase: 51 U/L (ref 39–117)
Anion gap: 7 (ref 5–15)
BUN: 17 mg/dL (ref 6–23)
CALCIUM: 8.8 mg/dL (ref 8.4–10.5)
CO2: 28 mmol/L (ref 19–32)
Chloride: 106 mmol/L (ref 96–112)
Creatinine, Ser: 0.91 mg/dL (ref 0.50–1.10)
GFR calc Af Amer: 77 mL/min — ABNORMAL LOW (ref >90)
GFR calc non Af Amer: 67 mL/min — ABNORMAL LOW (ref >90)
Glucose, Bld: 84 mg/dL (ref 70–99)
Potassium: 3.4 mmol/L — ABNORMAL LOW (ref 3.5–5.1)
Sodium: 141 mmol/L (ref 135–145)
Total Bilirubin: 0.6 mg/dL (ref 0.3–1.2)
Total Protein: 6.9 g/dL (ref 6.0–8.3)

## 2014-05-09 LAB — CBC WITH DIFFERENTIAL/PLATELET
BASOS ABS: 0 10*3/uL (ref 0.0–0.1)
Basophils Relative: 0 % (ref 0–1)
EOS ABS: 0.1 10*3/uL (ref 0.0–0.7)
EOS PCT: 1 % (ref 0–5)
HCT: 45.2 % (ref 36.0–46.0)
HEMOGLOBIN: 15 g/dL (ref 12.0–15.0)
LYMPHS ABS: 2.1 10*3/uL (ref 0.7–4.0)
Lymphocytes Relative: 24 % (ref 12–46)
MCH: 30 pg (ref 26.0–34.0)
MCHC: 33.2 g/dL (ref 30.0–36.0)
MCV: 90.4 fL (ref 78.0–100.0)
MONOS PCT: 4 % (ref 3–12)
Monocytes Absolute: 0.4 10*3/uL (ref 0.1–1.0)
NEUTROS ABS: 6.1 10*3/uL (ref 1.7–7.7)
Neutrophils Relative %: 71 % (ref 43–77)
PLATELETS: 276 10*3/uL (ref 150–400)
RBC: 5 MIL/uL (ref 3.87–5.11)
RDW: 13 % (ref 11.5–15.5)
WBC: 8.6 10*3/uL (ref 4.0–10.5)

## 2014-05-09 LAB — LIPASE, BLOOD: LIPASE: 21 U/L (ref 11–59)

## 2014-05-09 MED ORDER — IOHEXOL 300 MG/ML  SOLN
50.0000 mL | Freq: Once | INTRAMUSCULAR | Status: AC | PRN
  Administered 2014-05-09: 50 mL via ORAL

## 2014-05-09 MED ORDER — IOHEXOL 300 MG/ML  SOLN
100.0000 mL | Freq: Once | INTRAMUSCULAR | Status: AC | PRN
  Administered 2014-05-09: 100 mL via INTRAVENOUS

## 2014-05-09 MED ORDER — ONDANSETRON HCL 4 MG/2ML IJ SOLN
4.0000 mg | Freq: Once | INTRAMUSCULAR | Status: AC
  Administered 2014-05-09: 4 mg via INTRAVENOUS
  Filled 2014-05-09: qty 2

## 2014-05-09 MED ORDER — KETOROLAC TROMETHAMINE 30 MG/ML IJ SOLN
30.0000 mg | Freq: Once | INTRAMUSCULAR | Status: AC
  Administered 2014-05-09: 30 mg via INTRAVENOUS
  Filled 2014-05-09: qty 1

## 2014-05-09 MED ORDER — DIPHENHYDRAMINE HCL 50 MG/ML IJ SOLN
12.5000 mg | Freq: Once | INTRAMUSCULAR | Status: AC
  Administered 2014-05-09: 12.5 mg via INTRAVENOUS
  Filled 2014-05-09: qty 1

## 2014-05-09 MED ORDER — HYDROCODONE-ACETAMINOPHEN 5-325 MG PO TABS: 1.0000 | ORAL_TABLET | Freq: Four times a day (QID) | ORAL | Status: DC | PRN

## 2014-05-09 MED ORDER — ONDANSETRON 4 MG PO TBDP: ORAL_TABLET | ORAL | Status: DC

## 2014-05-09 MED ORDER — SODIUM CHLORIDE 0.9 % IV BOLUS (SEPSIS)
500.0000 mL | Freq: Once | INTRAVENOUS | Status: AC
  Administered 2014-05-09: 500 mL via INTRAVENOUS

## 2014-05-09 MED ORDER — METOCLOPRAMIDE HCL 5 MG/ML IJ SOLN
5.0000 mg | Freq: Once | INTRAMUSCULAR | Status: AC
  Administered 2014-05-09: 5 mg via INTRAVENOUS
  Filled 2014-05-09: qty 2

## 2014-05-09 MED ORDER — HYDROMORPHONE HCL 1 MG/ML IJ SOLN
0.5000 mg | Freq: Once | INTRAMUSCULAR | Status: AC
  Administered 2014-05-09: 0.5 mg via INTRAVENOUS
  Filled 2014-05-09: qty 1

## 2014-05-09 NOTE — Discharge Instructions (Signed)
Abdominal Pain, Women °Abdominal (stomach, pelvic, or belly) pain can be caused by many things. It is important to tell your doctor: °· The location of the pain. °· Does it come and go or is it present all the time? °· Are there things that start the pain (eating certain foods, exercise)? °· Are there other symptoms associated with the pain (fever, nausea, vomiting, diarrhea)? °All of this is helpful to know when trying to find the cause of the pain. °CAUSES  °· Stomach: virus or bacteria infection, or ulcer. °· Intestine: appendicitis (inflamed appendix), regional ileitis (Crohn's disease), ulcerative colitis (inflamed colon), irritable bowel syndrome, diverticulitis (inflamed diverticulum of the colon), or cancer of the stomach or intestine. °· Gallbladder disease or stones in the gallbladder. °· Kidney disease, kidney stones, or infection. °· Pancreas infection or cancer. °· Fibromyalgia (pain disorder). °· Diseases of the female organs: °¨ Uterus: fibroid (non-cancerous) tumors or infection. °¨ Fallopian tubes: infection or tubal pregnancy. °¨ Ovary: cysts or tumors. °¨ Pelvic adhesions (scar tissue). °¨ Endometriosis (uterus lining tissue growing in the pelvis and on the pelvic organs). °¨ Pelvic congestion syndrome (female organs filling up with blood just before the menstrual period). °¨ Pain with the menstrual period. °¨ Pain with ovulation (producing an egg). °¨ Pain with an IUD (intrauterine device, birth control) in the uterus. °¨ Cancer of the female organs. °· Functional pain (pain not caused by a disease, may improve without treatment). °· Psychological pain. °· Depression. °DIAGNOSIS  °Your doctor will decide the seriousness of your pain by doing an examination. °· Blood tests. °· X-rays. °· Ultrasound. °· CT scan (computed tomography, special type of X-ray). °· MRI (magnetic resonance imaging). °· Cultures, for infection. °· Barium enema (dye inserted in the large intestine, to better view it with  X-rays). °· Colonoscopy (looking in intestine with a lighted tube). °· Laparoscopy (minor surgery, looking in abdomen with a lighted tube). °· Major abdominal exploratory surgery (looking in abdomen with a large incision). °TREATMENT  °The treatment will depend on the cause of the pain.  °· Many cases can be observed and treated at home. °· Over-the-counter medicines recommended by your caregiver. °· Prescription medicine. °· Antibiotics, for infection. °· Birth control pills, for painful periods or for ovulation pain. °· Hormone treatment, for endometriosis. °· Nerve blocking injections. °· Physical therapy. °· Antidepressants. °· Counseling with a psychologist or psychiatrist. °· Minor or major surgery. °HOME CARE INSTRUCTIONS  °· Do not take laxatives, unless directed by your caregiver. °· Take over-the-counter pain medicine only if ordered by your caregiver. Do not take aspirin because it can cause an upset stomach or bleeding. °· Try a clear liquid diet (broth or water) as ordered by your caregiver. Slowly move to a bland diet, as tolerated, if the pain is related to the stomach or intestine. °· Have a thermometer and take your temperature several times a day, and record it. °· Bed rest and sleep, if it helps the pain. °· Avoid sexual intercourse, if it causes pain. °· Avoid stressful situations. °· Keep your follow-up appointments and tests, as your caregiver orders. °· If the pain does not go away with medicine or surgery, you may try: °¨ Acupuncture. °¨ Relaxation exercises (yoga, meditation). °¨ Group therapy. °¨ Counseling. °SEEK MEDICAL CARE IF:  °· You notice certain foods cause stomach pain. °· Your home care treatment is not helping your pain. °· You need stronger pain medicine. °· You want your IUD removed. °· You feel faint or   lightheaded. °· You develop nausea and vomiting. °· You develop a rash. °· You are having side effects or an allergy to your medicine. °SEEK IMMEDIATE MEDICAL CARE IF:  °· Your  pain does not go away or gets worse. °· You have a fever. °· Your pain is felt only in portions of the abdomen. The right side could possibly be appendicitis. The left lower portion of the abdomen could be colitis or diverticulitis. °· You are passing blood in your stools (bright red or black tarry stools, with or without vomiting). °· You have blood in your urine. °· You develop chills, with or without a fever. °· You pass out. °MAKE SURE YOU:  °· Understand these instructions. °· Will watch your condition. °· Will get help right away if you are not doing well or get worse. °Document Released: 11/06/2006 Document Revised: 05/26/2013 Document Reviewed: 11/26/2008 °[employer]® Patient Information ©2015 [employer]. This information is not intended to replace advice given to you by your health care provider. Make sure you discuss any questions you have with your health care provider. ° °

## 2014-05-09 NOTE — ED Provider Notes (Signed)
CSN: [id number]     Arrival date & time 05/09/14  1147 History   First MD Initiated Contact with Patient 05/09/14 1214     Chief Complaint  Patient presents with  . Abdominal Pain     (Consider location/radiation/quality/duration/timing/severity/associated sxs/prior Treatment) Patient is a 64 y.o. female presenting with abdominal pain.  Abdominal Pain Pain location:  LUQ and LLQ Pain quality: pressure   Pain radiates to:  Does not radiate Pain severity:  Moderate Onset quality:  Gradual Duration:  2 days Timing:  Constant Progression:  Worsening Chronicity:  New Context comment:  Spontaneously Relieved by:  Nothing Worsened by:  Nothing tried Ineffective treatments:  None tried Associated symptoms: nausea   Associated symptoms: no chest pain, no cough, no diarrhea, no dysuria, no fatigue, no fever, no hematuria, no shortness of breath and no vomiting     Past Medical History  Diagnosis Date  . Endometriosis   . Asthma   . Fibromyalgia   . Thyroid disease     Hypothyroid  . Elevated cholesterol   . Migraines   . Bowel obstruction   . GERD (gastroesophageal reflux disease)   . Impaired glucose tolerance 02/25/2011  . Complication of anesthesia     "I have the shakes"  . Heart murmur     mvp  . Shortness of breath     with exertion  . Arthritis   . Low back pain   . Hx of oral aphthous ulcers   . Biliary dyskinesia   . Diverticulosis   . Nocturia   . Hypothyroidism     hx nodule on thyroid   . Sleep apnea     has not used c-pap x 1 yr.   . Depression   . Cervical dysplasia   . Polycythemia vera   . BRCA negative 02/2013  . Hypertension   . Shingles   . Sickle cell trait    Past Surgical History  Procedure Laterality Date  . Tonsillectomy    . Pelvic laparoscopy  1995,2002    DL LSO DL RSO  . Vaginal hysterectomy  1983  . Breast surgery      Benign breast cyst  . Cholecystectomy  12/05/2011    Procedure: LAPAROSCOPIC CHOLECYSTECTOMY;  Surgeon:  Ralene Ok, MD;  Location: [facility];  Service: General;  Laterality: N/A;  . Colposcopy    . Gynecologic cryosurgery    . Oophorectomy      BSO   Family History  Problem Relation Age of Onset  . Hypertension Mother   . Heart disease Mother   . Breast cancer Mother     Age 84  . Cancer Mother     Bonecancer  . Hypertension Father   . Heart disease Father   . Diabetes Father   . Colon cancer Sister   . Sarcoidosis Sister   . Cancer Sister     colon  . Breast cancer Sister 53  . Breast cancer Maternal Grandmother     Age 30  . Ovarian cancer Paternal Grandmother   . Breast cancer Paternal Grandmother     Age unknown  . Sarcoidosis Brother   . Breast cancer Maternal Aunt     Age 30's  . Breast cancer Maternal Aunt     Age 49   History  Substance Use Topics  . Smoking status: Never Smoker   . Smokeless tobacco: Never Used  . Alcohol Use: 0.0 oz/week    0 Standard drinks or equivalent per week  Comment: socially   OB History    Gravida Para Term Preterm AB TAB SAB Ectopic Multiple Living   _0 Review of Systems  Constitutional: Negative for fever and fatigue.  HENT: Negative for congestion and drooling.   Eyes: Negative for pain.  Respiratory: Negative for cough and shortness of breath.   Cardiovascular: Negative for chest pain.  Gastrointestinal: Positive for nausea and abdominal pain. Negative for vomiting and diarrhea.  Genitourinary: Negative for dysuria and hematuria.  Musculoskeletal: Negative for back pain, gait problem and neck pain.  Skin: Negative for color change.  Neurological: Negative for dizziness and headaches.  Hematological: Negative for adenopathy.  Psychiatric/Behavioral: Negative for behavioral problems.  All other systems reviewed and are negative.     Allergies  Celecoxib; Codeine; Duloxetine; Erythromycin; Escitalopram oxalate; Ezetimibe; Iohexol; Milnacipran; Pregabalin; Rofecoxib; Statins; and Tramadol  Home  Medications   Prior to Admission medications   Medication Sig Start Date End Date Taking? Authorizing Provider  albuterol (PROVENTIL HFA;VENTOLIN HFA) 108 (90 BASE) MCG/ACT inhaler Inhale 2 puffs into the lungs every 6 (six) hours as needed for wheezing. For wheezing 02/06/14  Yes Biagio Borg, MD  aspirin EC 81 MG EC tablet Take 1 tablet (81 mg total) by mouth daily. 03/28/12  Yes Reyne Dumas, MD  Calcium Carbonate-Vitamin D (CALCIUM + D PO) Take 2 tablets by mouth at bedtime.    Yes Historical Provider, MD  cycloSPORINE (RESTASIS) 0.05 % ophthalmic emulsion Place 1 drop into both eyes 2 (two) times daily.   Yes Historical Provider, MD  dicyclomine (BENTYL) 10 MG capsule Take 1 capsule (10 mg total) by mouth 4 (four) times daily -  before meals and at bedtime. As needed 04/15/14 06/12/15 Yes Ladene Artist, MD  Estradiol Acetate 0.05 MG/24HR RING 1 vaginal ring every 3 months 04/24/14  Yes Anastasio Auerbach, MD  fentaNYL (DURAGESIC - DOSED MCG/HR) 25 MCG/HR Place 1 patch onto the skin every 3 (three) days.   Yes Historical Provider, MD  fluticasone (FLONASE) 50 MCG/ACT nasal spray Place 2 sprays into both nostrils as needed. Patient taking differently: Place 2 sprays into both nostrils as needed for allergies or rhinitis.  02/06/14  Yes Biagio Borg, MD  hydrochlorothiazide (HYDRODIURIL) 25 MG tablet Take 1 tablet (25 mg total) by mouth daily as needed (for edema). 04/29/14  Yes Biagio Borg, MD  HYDROcodone-acetaminophen (NORCO/VICODIN) 5-325 MG per tablet Take 1 tablet by mouth every 6 (six) hours as needed for moderate pain. 01/01/14  Yes Biagio Borg, MD  HYDROcodone-homatropine [facility]) 5-1.5 MG/5ML syrup Take 5 mLs by mouth every 6 (six) hours as needed for cough. 02/06/14  Yes Biagio Borg, MD  hydroquinone 4 % cream Apply 1 each topically daily. On face.   Yes Historical Provider, MD  ibuprofen (ADVIL,MOTRIN) 600 MG tablet Take 1 tablet (600 mg total) by mouth every 8 (eight) hours as  needed. Patient taking differently: Take 600 mg by mouth every 8 (eight) hours as needed for headache or moderate pain.  03/13/14  Yes Biagio Borg, MD  lansoprazole (PREVACID) 30 MG capsule Take 1 capsule (30 mg total) by mouth 2 (two) times daily before a meal. 04/15/14  Yes Ladene Artist, MD  lidocaine (LIDODERM) 5 % Place 1 patch onto the skin as needed (pain).  04/09/14  Yes Historical Provider, MD  naproxen (NAPROSYN) 500 MG tablet Take 500 mg by mouth daily  as needed (migraines).   Yes Historical Provider, MD  NONFORMULARY OR COMPOUNDED ITEM Boric Acid suppositories 600 mg Patient taking differently: Place 1 each vaginally daily as needed. Boric Acid suppositories 600 mg 04/27/14  Yes Anastasio Auerbach, MD  Polyethyl Glycol-Propyl Glycol (SYSTANE) 0.4-0.3 % GEL Place 1 application into both eyes daily.   Yes Historical Provider, MD  polyethylene glycol powder (MIRALAX) powder Take 17 g by mouth as needed. For constipation   Yes Historical Provider, MD  thyroid (ARMOUR) 30 MG tablet Take 1 tablet (30 mg total) by mouth daily before breakfast. 02/06/14  Yes Biagio Borg, MD  zonisamide (ZONEGRAN) 25 MG capsule Take 75 mg by mouth daily.   Yes Historical Provider, MD  nystatin (MYCOSTATIN) 100000 UNIT/ML suspension Take 5 mLs (500,000 Units total) by mouth 4 (four) times daily. Patient not taking: Reported on 05/09/2014 12/27/13   Tammi Sou, MD  thyroid [facility] THYROID) 30 MG tablet Take 1 tablet (30 mg total) by mouth daily before breakfast. Patient not taking: Reported on 05/09/2014 04/30/14   Biagio Borg, MD   BP 134/64 mmHg  Pulse 92  Temp(Src) 98.1 F (36.7 C) (Oral)  Resp 18  SpO2 100% Physical Exam  Constitutional: She is oriented to person, place, and time. She appears well-developed and well-nourished.  HENT:  Head: Normocephalic.  Mouth/Throat: Oropharynx is clear and moist. No oropharyngeal exudate.  Eyes: Conjunctivae and EOM are normal. Pupils are equal, round, and  reactive to light.  Neck: Normal range of motion. Neck supple.  Cardiovascular: Normal rate, regular rhythm, normal heart sounds and intact distal pulses.  Exam reveals no gallop and no friction rub.   No murmur heard. Pulmonary/Chest: Effort normal and breath sounds normal. No respiratory distress. She has no wheezes.  Abdominal: Soft. Bowel sounds are normal. There is tenderness (mild left sided abd ttp, seems to localize to LUQ.). There is no rebound and no guarding.  Musculoskeletal: Normal range of motion. She exhibits no edema or tenderness.  Neurological: She is alert and oriented to person, place, and time.  Skin: Skin is warm and dry.  Psychiatric: She has a normal mood and affect. Her behavior is normal.  Nursing note and vitals reviewed.   ED Course  Procedures (including critical care time) Labs Review Labs Reviewed  COMPREHENSIVE METABOLIC PANEL - Abnormal; Notable for the following:    Potassium 3.4 (*)    Albumin 3.3 (*)    GFR calc non Af Amer 67 (*)    GFR calc Af Amer 77 (*)    All other components within normal limits  URINALYSIS, ROUTINE W REFLEX MICROSCOPIC - Abnormal; Notable for the following:    APPearance CLOUDY (*)    All other components within normal limits  CBC WITH DIFFERENTIAL/PLATELET  LIPASE, BLOOD    Imaging Review Ct Abdomen Pelvis W Contrast  05/09/2014   CLINICAL DATA:  Patient complaining of left-sided abdominal pain and nausea for 2 days. Distended abdomen.  EXAM: CT ABDOMEN AND PELVIS WITH CONTRAST  TECHNIQUE: Multidetector CT imaging of the abdomen and pelvis was performed using the standard protocol following bolus administration of intravenous contrast.  CONTRAST:  23m OMNIPAQUE IOHEXOL 300 MG/ML SOLN, 1037mOMNIPAQUE IOHEXOL 300 MG/ML SOLN  COMPARISON:  CT 02/02/2011  FINDINGS: Lower chest: Minimal dependent atelectasis within the bilateral lung bases. No pleural effusion. Normal heart size.  Hepatobiliary: Liver is normal in size and  contour without focal hepatic lesion identified. Post cholecystectomy. No intrahepatic or extrahepatic biliary  ductal dilatation. Portal veins patent.  Pancreas: Unremarkable  Spleen: Unremarkable  Adrenals/Urinary Tract: Unchanged 1.3 cm lesion within the left adrenal gland (image 20; series 2), stable dating back to 06/04/2009, most compatible with benign adrenal adenoma. The right adrenal gland is unremarkable. Kidneys enhance symmetrically with contrast. No hydronephrosis. Urinary bladder is unremarkable.  Stomach/Bowel: Extensive sigmoid colonic diverticulosis. No CT evidence to suggest acute diverticulitis. Stool throughout the colon. Oral contrast material throughout the small bowel. No evidence for small bowel obstruction. No abnormal small bowel wall thickening. The stomach is grossly unremarkable.  Vascular/Lymphatic: Normal caliber abdominal aorta. No retroperitoneal lymphadenopathy.  Other: Pessary device in place.  Musculoskeletal: No aggressive or acute appearing osseous lesions. Lower lumbar spine degenerative changes.  IMPRESSION: No acute process within the abdomen or pelvis.  Sigmoid colonic diverticulosis without CT evidence for acute diverticulitis.   Electronically Signed   By: Lovey Newcomer M.D.   On: 05/09/2014 14:23     EKG Interpretation None      MDM   Final diagnoses:  Left sided abdominal pain    12:46 PM 64 y.o. female who presents with left-sided abdominal pain which began 2 days ago. She denies any vomiting or diarrhea but has had some nausea. Denies dysuria. Afebrile and vital signs unremarkable here. We'll get labs and imaging.  3:44 PM: I interpreted/reviewed the labs and/or imaging which were non-contributory.  Pt feeling better.  I have discussed the diagnosis/risks/treatment options with the patient and believe the pt to be eligible for discharge home to follow-up with her pcp and gi doctor. We also discussed returning to the ED immediately if new or worsening sx  occur. We discussed the sx which are most concerning (e.g., worsening pain, fever, vomiting) that necessitate immediate return. Medications administered to the patient during their visit and any new prescriptions provided to the patient are listed below.  Medications given during this visit Medications  sodium chloride 0.9 % bolus 500 mL (0 mLs Intravenous Stopped 05/09/14 1331)  HYDROmorphone (DILAUDID) injection 0.5 mg (0.5 mg Intravenous Given 05/09/14 1246)  ondansetron (ZOFRAN) injection 4 mg (4 mg Intravenous Given 05/09/14 1245)  iohexol (OMNIPAQUE) 300 MG/ML solution 50 mL (50 mLs Oral Contrast Given 05/09/14 1315)  iohexol (OMNIPAQUE) 300 MG/ML solution 100 mL (100 mLs Intravenous Contrast Given 05/09/14 1405)  ketorolac (TORADOL) 30 MG/ML injection 30 mg (30 mg Intravenous Given 05/09/14 1537)  metoCLOPramide (REGLAN) injection 5 mg (5 mg Intravenous Given 05/09/14 1537)  diphenhydrAMINE (BENADRYL) injection 12.5 mg (12.5 mg Intravenous Given 05/09/14 1538)    New Prescriptions   HYDROCODONE-ACETAMINOPHEN (NORCO) 5-325 MG PER TABLET    Take 1 tablet by mouth every 6 (six) hours as needed for moderate pain.   ONDANSETRON (ZOFRAN ODT) 4 MG DISINTEGRATING TABLET    81m ODT q4 hours prn nausea/vomit     FPamella Pert MD 05/09/14 1545

## 2014-05-09 NOTE — ED Notes (Signed)
Son's contact info, [place]

## 2014-05-09 NOTE — ED Notes (Signed)
Pt from home c/o left sided abdominal pain and nausea x 2 days. Pt reports her abdomen is distended. Last BM was this a.m but very small. Rates pain 9/10.

## 2014-05-09 NOTE — ED Notes (Addendum)
Discharge delayed due to pt recent medication administration that may impair pt's driving ability.  Dr made aware.

## 2014-05-13 ENCOUNTER — Other Ambulatory Visit: Payer: Medicare Other

## 2014-05-13 ENCOUNTER — Encounter: Payer: Self-pay | Admitting: Genetic Counselor

## 2014-05-13 ENCOUNTER — Ambulatory Visit (HOSPITAL_BASED_OUTPATIENT_CLINIC_OR_DEPARTMENT_OTHER): Payer: Medicare Other | Admitting: Genetic Counselor

## 2014-05-13 DIAGNOSIS — Z315 Encounter for genetic counseling: Secondary | ICD-10-CM | POA: Diagnosis not present

## 2014-05-13 DIAGNOSIS — Z800 Family history of malignant neoplasm of digestive organs: Secondary | ICD-10-CM

## 2014-05-13 DIAGNOSIS — Z803 Family history of malignant neoplasm of breast: Secondary | ICD-10-CM | POA: Diagnosis not present

## 2014-05-13 NOTE — Progress Notes (Signed)
REFERRING PROVIDER: Biagio Borg, MD [place], [place] [address]   Donalynn Furlong, MD  PRIMARY PROVIDER:  Cathlean Cower, MD  PRIMARY REASON FOR VISIT:  1. Family history of breast cancer   2. Family history of colon cancer      HISTORY OF PRESENT ILLNESS:   [name], a 64 y.o. female, was seen for a [facility] cancer genetics consultation at the request of Dr. Phineas Real due to a family history of cancer.  [name] presents to clinic today to discuss the possibility of a hereditary predisposition to cancer, genetic testing, and to further clarify her future cancer risks, as well as potential cancer risks for family members. [name] is a 64 y.o. female with no personal history of cancer.  She reportedly was tested for BRCA a year or two ago and is negative.  CANCER HISTORY:   No history exists.     HORMONAL RISK FACTORS:  Menarche was at age 15.  First live birth at age 89.  OCP use for approximately 0 years.  Ovaries intact: no.  Hysterectomy: yes.  Menopausal status: postmenopausal.  HRT use: 17 years. Colonoscopy: yes; normal. Mammogram within the last year: yes. Number of breast biopsies: 0. Up to date with pelvic exams:  yes. Any excessive radiation exposure in the past:  no  Past Medical History  Diagnosis Date  . Endometriosis   . Asthma   . Fibromyalgia   . Thyroid disease     Hypothyroid  . Elevated cholesterol   . Migraines   . Bowel obstruction   . GERD (gastroesophageal reflux disease)   . Impaired glucose tolerance 02/25/2011  . Complication of anesthesia     "I have the shakes"  . Heart murmur     mvp  . Shortness of breath     with exertion  . Arthritis   . Low back pain   . Hx of oral aphthous ulcers   . Biliary dyskinesia   . Diverticulosis   . Nocturia   . Hypothyroidism     hx nodule on thyroid   . Sleep apnea     has not used c-pap x 1 yr.   . Depression   . Cervical dysplasia   . Polycythemia vera   . BRCA  negative 02/2013  . Hypertension   . Shingles   . Sickle cell trait     Past Surgical History  Procedure Laterality Date  . Tonsillectomy    . Pelvic laparoscopy  1995,2002    DL LSO DL RSO  . Vaginal hysterectomy  1983  . Breast surgery      Benign breast cyst  . Cholecystectomy  12/05/2011    Procedure: LAPAROSCOPIC CHOLECYSTECTOMY;  Surgeon: Ralene Ok, MD;  Location: [facility];  Service: General;  Laterality: N/A;  . Colposcopy    . Gynecologic cryosurgery    . Oophorectomy      BSO    History   Social History  . Marital Status: Single    Spouse Name: N/A  . Number of Children: 2  . Years of Education: N/A   Occupational History  . retired    Social History Main Topics  . Smoking status: Never Smoker   . Smokeless tobacco: Never Used  . Alcohol Use: 0.0 oz/week    0 Standard drinks or equivalent per week     Comment: socially  . Drug Use: No  . Sexual Activity: Not Currently    Birth  Control/ Protection: Surgical     Comment: HYST-1st intercourse 17 yo-5 partners   Other Topics Concern  . None   Social History Narrative     FAMILY HISTORY:  We obtained a detailed, 4-generation family history.  Significant diagnoses are listed below: Family History  Problem Relation Age of Onset  . Hypertension Mother   . Heart disease Mother   . Breast cancer Mother     Age 46  . Cancer Mother     Bonecancer  . Hypertension Father   . Heart disease Father   . Diabetes Father   . Colon cancer Sister 70  . Sarcoidosis Sister   . Breast cancer Sister 14  . Breast cancer Maternal Grandmother     Age 68  . Ovarian cancer Paternal Grandmother   . Breast cancer Paternal Grandmother     Age unknown  . Sarcoidosis Brother   . Breast cancer Maternal Aunt     Age 67's  . Breast cancer Maternal Aunt     Age 80   The patient has one sister and two brothers.  Her sister had colon cancer at 8 and breast cancer at 59.  Her mother had breast cancer at 45, 63 and 83.   Her mother had three sisters and two brothers.  Two sisters reportedly had breast cancer, and her mother had breast cancer.  [name] father had three brothers and a sister.  His mother had breast and ovarian cancer. Patient's maternal ancestors are of African American descent, and paternal ancestors are of African American descent. There is no reported Ashkenazi Jewish ancestry. There is no known consanguinity.  GENETIC COUNSELING ASSESSMENT: [name] is a 64 y.o. female with a fmaily history of cancer which somewhat suggestive of a hereditary cancer syndrome and predisposition to cancer. We, therefore, discussed and recommended the following at today's visit.   DISCUSSION: We reviewed the characteristics, features and inheritance patterns of hereditary cancer syndromes. We also discussed genetic testing, including the appropriate family members to test, the process of testing, insurance coverage and turn-around-time for results. We discussed the implications of a negative, positive and/or variant of uncertain significant result. We recommended [name] pursue genetic testing for the OvaNext gene panel.  The OvaNext gene panel offered by [facility] and includes sequencing and rearrangement analysis for the following 24 genes: ATM, BARD1, BRCA1, BRCA2, BRIP1, CDH1, CHEK2, EPCAM, MLH1, MRE11A, MSH2, MSH6, MUTYH, NBN, NF1, PALB2, PMS2, PTEN, RAD50, RAD51C, RAD51D, SMARCA4, STK11, and TP53.  gene panel.   In order to estimate her chance of having a BRCA mutation, we used statistical models (Tyrer Cusik) and laboratory data that take into account her personal medical history, family history and ancestry.  Because each model is different, there can be a lot of variability in the risks they give.  Therefore, these numbers must be considered a rough range and not a precise risk of having a BRCA mutation.  These models estimate that she has approximately a 3.16% chance of having a mutation. Based  on this assessment of her family and personal history, genetic testing is recommended.  Based on the patient's personal and family history, statistical models (Tyrer Cusik)  and literature data were used to estimate her risk of developing breast cancer. This estimates her lifetime risk of developing breast cancer to be approximately 20.9%. This estimation does not take into account any genetic testing results.  The patient's lifetime breast cancer risk is a preliminary estimate based on available information  using one of several models endorsed by the [place] (ACS). The ACS recommends consideration of breast MRI screening as an adjunct to mammography for patients at high risk (defined as 20% or greater lifetime risk). A more detailed breast cancer risk assessment can be considered, if clinically indicated.   PLAN: After considering the risks, benefits, and limitations, [name]  provided informed consent to pursue genetic testing and the blood sample was sent to [facility] for analysis of the [place]. Results should be available within approximately 3 weeks' time, at which point they will be disclosed by telephone to [name], as will any additional recommendations warranted by these results. [name] will receive a summary of her genetic counseling visit and a copy of her results once available. This information will also be available in Epic. We encouraged [name] to remain in contact with cancer genetics annually so that we can continuously update the family history and inform her of any changes in cancer genetics and testing that may be of benefit for her family. [name] questions were answered to her satisfaction today. Our contact information was provided should additional questions or concerns arise.  Lastly, we encouraged [name] to remain in contact with cancer genetics annually so that we can continuously update the family history and inform her of any changes  in cancer genetics and testing that may be of benefit for this family.   Ms.  Parker questions were answered to her satisfaction today. Our contact information was provided should additional questions or concerns arise. Thank you for the referral and allowing [place] to share in the care of your patient.   Karen P. Florene Glen, [place], [facility] Certified Genetic Counselor [email or web address] phone: [phone]  The patient was seen for a total of 60 minutes in face-to-face genetic counseling.  This patient was discussed with Drs. Magrinat, Lindi Adie and/or Burr Medico who agrees with the above.    _______________________________________________________________________ For Office Staff:  Number of people involved in session: 1 Was an Intern/ student involved with case: no

## 2014-05-19 ENCOUNTER — Ambulatory Visit: Payer: Medicare Other | Admitting: Gastroenterology

## 2014-06-04 ENCOUNTER — Telehealth: Payer: Self-pay | Admitting: Genetic Counselor

## 2014-06-04 ENCOUNTER — Encounter: Payer: Self-pay | Admitting: Genetic Counselor

## 2014-06-04 DIAGNOSIS — Z803 Family history of malignant neoplasm of breast: Secondary | ICD-10-CM

## 2014-06-04 DIAGNOSIS — Z1379 Encounter for other screening for genetic and chromosomal anomalies: Secondary | ICD-10-CM | POA: Insufficient documentation

## 2014-06-04 DIAGNOSIS — Z800 Family history of malignant neoplasm of digestive organs: Secondary | ICD-10-CM

## 2014-06-04 NOTE — Telephone Encounter (Signed)
Revealed negative genetic testing.  Gabriellah reported that her maternal aunt was just diagnosed with breast cancer.  She is asked about having her aunt tested as well. We discussed that we could do genetic testing on her aunt if referred.  We also briefly discussed Exome sequencing as this may identify something that we are not finding in our genetic testing.

## 2014-06-04 NOTE — Progress Notes (Signed)
HPI: Ms. Pawelski was previously seen in the [place] clinic due to a family history of cancer and concerns regarding a hereditary predisposition to cancer. Please refer to our prior cancer genetics clinic note for more information regarding Ms. Pott's medical, social and family histories, and our assessment and recommendations, at the time. Ms. Shannon recent genetic test results were disclosed to her, as were recommendations warranted by these results. These results and recommendations are discussed in more detail below.  GENETIC TEST RESULTS: At the time of Ms. Lemoine's visit, we recommended she pursue genetic testing of the OvaNext gene panel. The OvaNext gene panel offered by [facility] and includes sequencing and rearrangement analysis for the following 24 genes: ATM, BARD1, BRCA1, BRCA2, BRIP1, CDH1, CHEK2, EPCAM, MLH1, MRE11A, MSH2, MSH6, MUTYH, NBN, NF1, PALB2, PMS2, PTEN, RAD50, RAD51C, RAD51D, SMARCA4, STK11, and TP53.   The report date is Jun 04, 2014.  Genetic testing was normal, and did not reveal a deleterious mutation in these genes. The test report has been scanned into EPIC and is located under the Media tab.   We discussed with Ms. Stegman that since the current genetic testing is not perfect, it is possible there may be a gene mutation in one of these genes that current testing cannot detect, but that chance is small. We also discussed, that it is possible that another gene that has not yet been discovered, or that we have not yet tested, is responsible for the cancer diagnoses in the family, and it is, therefore, important to remain in touch with cancer genetics in the future so that we can continue to offer Ms. Grothe the most up to date genetic testing.   ADDITIONAL GENETIC TESTING: We discussed with Ms. Enright that there are other genes that are associated with increased cancer risk that can be analyzed. The laboratories that offer such testing look at these  additional genes via a hereditary cancer gene panel or even Exome testing. Should Ms. Norkus wish to pursue additional genetic testing, we are happy to discuss and coordinate this testing, at any time.    CANCER SCREENING RECOMMENDATIONS: Given Ms. Hendley's personal and family histories, we must interpret these negative results with some caution.  Families with features suggestive of hereditary risk for cancer tend to have multiple family members with cancer, diagnoses in multiple generations and diagnoses before the age of 59. Ms. Grieshop family exhibits some of these features. Thus this result may simply reflect our current inability to detect all mutations within these genes or there may be a different gene that has not yet been discovered or tested.   Ms. Wilford updated her family history during our phone call.  She reported that her remaining maternal aunt, who is 69, was just diagnosed with breast cancer.  Therefore, we updated her previous breast cancer risk assessment, adding in this additional family history.  Based on the patient's personal and family history, statistical models (Tyrer Cusik)  and literature data were used to estimate her risk of developing breast cancer. This estimates her lifetime risk of developing breast cancer to be approximately 38.8%. This estimation does take into account any genetic testing results.  The patient's lifetime breast cancer risk is a preliminary estimate based on available information using one of several models endorsed by the [place] (ACS). The ACS recommends consideration of breast MRI screening as an adjunct to mammography for patients at high risk (defined as 20% or greater lifetime risk). A more detailed  breast cancer risk assessment can be considered, if clinically indicated.   RECOMMENDATIONS FOR FAMILY MEMBERS: Women in this family might be at some increased risk of developing cancer, over the general population risk, simply due to the  family history of cancer. We recommended women in this family have a yearly mammogram beginning at age 74, or 67 years younger than the earliest onset of cancer, an an annual clinical breast exam, and perform monthly breast self-exams. Women in this family should also have a gynecological exam as recommended by their primary provider. All family members should have a colonoscopy by age 69.  FOLLOW-UP: Lastly, we discussed with Ms. Colvin that cancer genetics is a rapidly advancing field and it is possible that new genetic tests will be appropriate for her and/or her family members in the future. We encouraged her to remain in contact with cancer genetics on an annual basis so we can update her personal and family histories and let her know of advances in cancer genetics that may benefit this family.   Our contact number was provided. Ms. Osterberg questions were answered to her satisfaction, and she knows she is welcome to call us at anytime with additional questions or concerns.   Roma Kayser, MS, [facility] Certified Genetic Counselor [email or web address]

## 2014-06-05 ENCOUNTER — Telehealth: Payer: Self-pay | Admitting: Genetic Counselor

## 2014-06-05 NOTE — Telephone Encounter (Signed)
Let [name] know that her [name] liferime risk for breast cancer based on her updated family history of cancer has increased to 38.8%.  Discussed with Logan to please talk with her aunt about she and her sister's negative genetic testing.  Explained that this should not affect any referrals for genetic testing, but that it could inform her treatment and possible inform the testing that could be offered.

## 2014-06-08 NOTE — H&P (Signed)
  Subjective:    Patient ID: [name] is a 64 y.o. female.  Follow-up  Here for follow up discussion prior to planned panniculectomy. Patient reports several year history of yeast infections and odor and associated itching. She reports she takes 3 showers per day and frequent use of topical antifungals. Has used Boric acid in past for same reason. Current weight is down from highest of 250 lb. Goes to gym, does water aerobics.  Has been told she has hernia; review of last CT abdomen 2013 with no evidence of this with exception small umbilical.   Has had follow up of high hb and felt to be related to her OSA and no futher workup noted.   Multiple episodes of CP and SOB; cardiac work up normal.  Review of Systems     Objective:   Physical Exam  Cardiovascular: Normal rate, regular rhythm and normal heart sounds.  Pulmonary/Chest: Effort normal and breath sounds normal.  Abdominal: Soft.  No discernable hernia though reports TTP right and inferior of umbilicus  Panniculus present that extends to pubic bone, no active rashes though hyperpigmentation from scarring  +striae throughout abdomen     Assessment:     Panniculitis     Plan:     Long standing history of panniculitis.Reviewed panniculectomy incisions, overnight hospital stay, drains, post procedure visits and limitations. This would remove large part of her panniculus but will not alter her upper abdomen. Reviewed that with further weight loss, she may desire procedure again.  Counseled she is not candidate for traditional abdominoplasty given her current weight. We reviewed pictures and expected outcomes, counseled she needs to expect abdomen will not be flat as she has weight loss further that needs to happen to achieve this. Discussed any hernia that is significant , cannot be repaired primarily as I will not be able to address during this surgery. Counseled liposuction would not be a part of medical  indicated panniculectomy, that she can expect resection of all soft tissue below umbilicus but this will have no effect on supraumbilical soft tissue thickness and no plication abdominal muscles planned given current wt.       Irene Limbo, MD [facility] Plastic & Reconstructive Surgery [phone]

## 2014-06-10 ENCOUNTER — Encounter (HOSPITAL_BASED_OUTPATIENT_CLINIC_OR_DEPARTMENT_OTHER): Payer: Self-pay | Admitting: *Deleted

## 2014-06-11 ENCOUNTER — Telehealth: Payer: Self-pay | Admitting: Gynecology

## 2014-06-11 ENCOUNTER — Encounter (HOSPITAL_BASED_OUTPATIENT_CLINIC_OR_DEPARTMENT_OTHER)
Admission: RE | Admit: 2014-06-11 | Discharge: 2014-06-11 | Disposition: A | Discharge status: Home / Self Care | Payer: Medicare Other | Source: Ambulatory Visit | Attending: Plastic Surgery | Admitting: Plastic Surgery

## 2014-06-11 DIAGNOSIS — Z6838 Body mass index (BMI) 38.0-38.9, adult: Secondary | ICD-10-CM | POA: Diagnosis not present

## 2014-06-11 DIAGNOSIS — M797 Fibromyalgia: Secondary | ICD-10-CM | POA: Diagnosis not present

## 2014-06-11 DIAGNOSIS — G4733 Obstructive sleep apnea (adult) (pediatric): Secondary | ICD-10-CM | POA: Diagnosis not present

## 2014-06-11 DIAGNOSIS — Z803 Family history of malignant neoplasm of breast: Secondary | ICD-10-CM

## 2014-06-11 DIAGNOSIS — J449 Chronic obstructive pulmonary disease, unspecified: Secondary | ICD-10-CM | POA: Diagnosis not present

## 2014-06-11 DIAGNOSIS — I341 Nonrheumatic mitral (valve) prolapse: Secondary | ICD-10-CM | POA: Diagnosis not present

## 2014-06-11 DIAGNOSIS — F1120 Opioid dependence, uncomplicated: Secondary | ICD-10-CM | POA: Diagnosis not present

## 2014-06-11 DIAGNOSIS — I10 Essential (primary) hypertension: Secondary | ICD-10-CM | POA: Diagnosis not present

## 2014-06-11 DIAGNOSIS — M793 Panniculitis, unspecified: Secondary | ICD-10-CM | POA: Diagnosis not present

## 2014-06-11 DIAGNOSIS — E039 Hypothyroidism, unspecified: Secondary | ICD-10-CM | POA: Diagnosis not present

## 2014-06-11 DIAGNOSIS — M1990 Unspecified osteoarthritis, unspecified site: Secondary | ICD-10-CM | POA: Diagnosis not present

## 2014-06-11 DIAGNOSIS — K219 Gastro-esophageal reflux disease without esophagitis: Secondary | ICD-10-CM | POA: Diagnosis not present

## 2014-06-11 DIAGNOSIS — J45909 Unspecified asthma, uncomplicated: Secondary | ICD-10-CM | POA: Diagnosis not present

## 2014-06-11 LAB — BASIC METABOLIC PANEL
Anion gap: 7 (ref 5–15)
BUN: 9 mg/dL (ref 6–20)
CO2: 31 mmol/L (ref 22–32)
Calcium: 9.6 mg/dL (ref 8.9–10.3)
Chloride: 103 mmol/L (ref 101–111)
Creatinine, Ser: 0.91 mg/dL (ref 0.44–1.00)
GFR calc Af Amer: >60 mL/min (ref >60)
GFR calc non Af Amer: >60 mL/min (ref >60)
Glucose, Bld: 83 mg/dL (ref 65–99)
POTASSIUM: 4.2 mmol/L (ref 3.5–5.1)
Sodium: 141 mmol/L (ref 135–145)

## 2014-06-11 NOTE — Telephone Encounter (Signed)
The following is a note from the genetic counselor Roma Kayser. Read the following to the patient.   Dr. Phineas Real - [name] has negative genetic test results, however, she updated me that her maternal aunt was just diagnosed with breast cancer. I recalculated her [name], which estimates that her lifetime risk for breast cancer is 38.6% - well above the ACS recommended risk for MRI. I also mentioned that since she and her sister have both had negative genetic testing, that they may want to discuss testing such as Exome sequencing. Just a suggestion. Unfortunately, I do not have any estimates on what the pick up rate would be on this type of test after a negative hereditary panel test.        Thanks,         Santiago Glad    If the patient is interested in scheduling an MRI of the breast due to increased calculated risk of breast cancer then schedule this for her.  I would recommend scheduling this at a six-month interval from her last mammogram. Also if she is interested in discussing doing other sequencing genetic testing such as the Exome that Santiago Glad talked about that I would recommend her making another appointment to talk to Roma Kayser. I really no nothing about what she is talking about and I think it would be better for the patient here directly from the genetic counselor.

## 2014-06-12 ENCOUNTER — Ambulatory Visit (HOSPITAL_BASED_OUTPATIENT_CLINIC_OR_DEPARTMENT_OTHER): Payer: Medicare Other | Admitting: Anesthesiology

## 2014-06-12 ENCOUNTER — Encounter (HOSPITAL_BASED_OUTPATIENT_CLINIC_OR_DEPARTMENT_OTHER)
Admission: RE | Disposition: A | Discharge status: Home / Self Care | Payer: Self-pay | Source: Ambulatory Visit | Attending: Plastic Surgery

## 2014-06-12 ENCOUNTER — Ambulatory Visit (HOSPITAL_BASED_OUTPATIENT_CLINIC_OR_DEPARTMENT_OTHER)
Admission: RE | Admit: 2014-06-12 | Discharge: 2014-06-13 | Disposition: A | Discharge status: Home / Self Care | Payer: Medicare Other | Source: Ambulatory Visit | Attending: Plastic Surgery | Admitting: Plastic Surgery

## 2014-06-12 ENCOUNTER — Encounter (HOSPITAL_BASED_OUTPATIENT_CLINIC_OR_DEPARTMENT_OTHER): Payer: Self-pay | Admitting: Anesthesiology

## 2014-06-12 DIAGNOSIS — G4733 Obstructive sleep apnea (adult) (pediatric): Secondary | ICD-10-CM | POA: Diagnosis not present

## 2014-06-12 DIAGNOSIS — E039 Hypothyroidism, unspecified: Secondary | ICD-10-CM | POA: Insufficient documentation

## 2014-06-12 DIAGNOSIS — K219 Gastro-esophageal reflux disease without esophagitis: Secondary | ICD-10-CM | POA: Insufficient documentation

## 2014-06-12 DIAGNOSIS — M797 Fibromyalgia: Secondary | ICD-10-CM | POA: Insufficient documentation

## 2014-06-12 DIAGNOSIS — F1120 Opioid dependence, uncomplicated: Secondary | ICD-10-CM | POA: Insufficient documentation

## 2014-06-12 DIAGNOSIS — J45909 Unspecified asthma, uncomplicated: Secondary | ICD-10-CM | POA: Insufficient documentation

## 2014-06-12 DIAGNOSIS — I10 Essential (primary) hypertension: Secondary | ICD-10-CM | POA: Insufficient documentation

## 2014-06-12 DIAGNOSIS — I341 Nonrheumatic mitral (valve) prolapse: Secondary | ICD-10-CM | POA: Insufficient documentation

## 2014-06-12 DIAGNOSIS — M1990 Unspecified osteoarthritis, unspecified site: Secondary | ICD-10-CM | POA: Insufficient documentation

## 2014-06-12 DIAGNOSIS — M793 Panniculitis, unspecified: Secondary | ICD-10-CM | POA: Diagnosis present

## 2014-06-12 DIAGNOSIS — J449 Chronic obstructive pulmonary disease, unspecified: Secondary | ICD-10-CM | POA: Insufficient documentation

## 2014-06-12 DIAGNOSIS — Z6838 Body mass index (BMI) 38.0-38.9, adult: Secondary | ICD-10-CM | POA: Insufficient documentation

## 2014-06-12 HISTORY — DX: Anxiety disorder, unspecified: F41.9

## 2014-06-12 HISTORY — PX: PANNICULECTOMY: SHX5360

## 2014-06-12 LAB — POCT HEMOGLOBIN-HEMACUE: Hemoglobin: 15.8 g/dL — ABNORMAL HIGH (ref 12.0–15.0)

## 2014-06-12 SURGERY — PANNICULECTOMY
Anesthesia: General

## 2014-06-12 MED ORDER — ZONISAMIDE 25 MG PO CAPS: 75.0000 mg | ORAL_CAPSULE | Freq: Every day | ORAL | Status: DC

## 2014-06-12 MED ORDER — CEFAZOLIN SODIUM 1-5 GM-% IV SOLN
1.0000 g | Freq: Three times a day (TID) | INTRAVENOUS | Status: AC
  Administered 2014-06-12 (×2): 1 g via INTRAVENOUS

## 2014-06-12 MED ORDER — NYSTATIN 100000 UNIT/ML MT SUSP: 5.0000 mL | Freq: Four times a day (QID) | OROMUCOSAL | Status: DC | PRN

## 2014-06-12 MED ORDER — MEPERIDINE HCL 25 MG/ML IJ SOLN: 6.2500 mg | INTRAMUSCULAR | Status: DC | PRN

## 2014-06-12 MED ORDER — ONDANSETRON HCL 4 MG/2ML IJ SOLN
INTRAMUSCULAR | Status: DC | PRN
  Administered 2014-06-12: 4 mg via INTRAVENOUS

## 2014-06-12 MED ORDER — HYDROMORPHONE HCL 1 MG/ML IJ SOLN
0.5000 mg | INTRAMUSCULAR | Status: DC | PRN
  Administered 2014-06-13: 1 mg via INTRAVENOUS
  Filled 2014-06-12: qty 1

## 2014-06-12 MED ORDER — FENTANYL CITRATE (PF) 100 MCG/2ML IJ SOLN: 50.0000 ug | INTRAMUSCULAR | Status: DC | PRN

## 2014-06-12 MED ORDER — PANTOPRAZOLE SODIUM 20 MG PO TBEC: 20.0000 mg | DELAYED_RELEASE_TABLET | Freq: Every day | ORAL | Status: DC

## 2014-06-12 MED ORDER — 0.9 % SODIUM CHLORIDE (POUR BTL) OPTIME
TOPICAL | Status: DC | PRN
  Administered 2014-06-12: 1000 mL

## 2014-06-12 MED ORDER — HYDROMORPHONE HCL 1 MG/ML IJ SOLN
INTRAMUSCULAR | Status: AC
  Filled 2014-06-12: qty 1

## 2014-06-12 MED ORDER — CEFAZOLIN SODIUM-DEXTROSE 2-3 GM-% IV SOLR
2.0000 g | INTRAVENOUS | Status: AC
  Administered 2014-06-12: 2 g via INTRAVENOUS

## 2014-06-12 MED ORDER — KCL IN DEXTROSE-NACL 20-5-0.45 MEQ/L-%-% IV SOLN
INTRAVENOUS | Status: DC
  Administered 2014-06-12: 12:00:00 via INTRAVENOUS
  Filled 2014-06-12: qty 1000

## 2014-06-12 MED ORDER — SCOPOLAMINE 1 MG/3DAYS TD PT72
MEDICATED_PATCH | TRANSDERMAL | Status: AC
  Filled 2014-06-12: qty 1

## 2014-06-12 MED ORDER — HYDROMORPHONE HCL 1 MG/ML IJ SOLN
0.2500 mg | INTRAMUSCULAR | Status: DC | PRN
Start: 2014-06-12 — End: 2014-06-13
  Administered 2014-06-12 (×4): 0.5 mg via INTRAVENOUS

## 2014-06-12 MED ORDER — HYDROCHLOROTHIAZIDE 25 MG PO TABS: 25.0000 mg | ORAL_TABLET | Freq: Every day | ORAL | Status: DC | PRN

## 2014-06-12 MED ORDER — FENTANYL 25 MCG/HR TD PT72: 25.0000 ug | MEDICATED_PATCH | TRANSDERMAL | Status: DC

## 2014-06-12 MED ORDER — MIDAZOLAM HCL 2 MG/2ML IJ SOLN
1.0000 mg | INTRAMUSCULAR | Status: DC | PRN
  Administered 2014-06-12: 2 mg via INTRAVENOUS

## 2014-06-12 MED ORDER — PHENYLEPHRINE HCL 10 MG/ML IJ SOLN
10.0000 mg | INTRAVENOUS | Status: DC | PRN
  Administered 2014-06-12: 100 ug/min via INTRAVENOUS

## 2014-06-12 MED ORDER — BUPIVACAINE LIPOSOME 1.3 % IJ SUSP
INTRAMUSCULAR | Status: DC | PRN
  Administered 2014-06-12: 20 mL

## 2014-06-12 MED ORDER — ONDANSETRON HCL 4 MG/2ML IJ SOLN: 4.0000 mg | Freq: Four times a day (QID) | INTRAMUSCULAR | Status: DC | PRN

## 2014-06-12 MED ORDER — DICYCLOMINE HCL 10 MG PO CAPS: 10.0000 mg | ORAL_CAPSULE | Freq: Three times a day (TID) | ORAL | Status: DC

## 2014-06-12 MED ORDER — OXYCODONE-ACETAMINOPHEN 5-325 MG PO TABS: 1.0000 | ORAL_TABLET | ORAL | Status: DC | PRN

## 2014-06-12 MED ORDER — GLYCOPYRROLATE 0.2 MG/ML IJ SOLN: 0.2000 mg | Freq: Once | INTRAMUSCULAR | Status: AC | PRN

## 2014-06-12 MED ORDER — POLYETHYL GLYCOL-PROPYL GLYCOL 0.4-0.3 % OP GEL: 1.0000 "application " | Freq: Every day | OPHTHALMIC | Status: DC

## 2014-06-12 MED ORDER — LIDOCAINE HCL (CARDIAC) 20 MG/ML IV SOLN
INTRAVENOUS | Status: DC | PRN
  Administered 2014-06-12: 100 mg via INTRAVENOUS

## 2014-06-12 MED ORDER — OXYCODONE-ACETAMINOPHEN 5-325 MG PO TABS
1.0000 | ORAL_TABLET | ORAL | Status: DC | PRN
  Administered 2014-06-12 (×2): 2 via ORAL
  Filled 2014-06-12 (×2): qty 2

## 2014-06-12 MED ORDER — THYROID 30 MG PO TABS: 30.0000 mg | ORAL_TABLET | Freq: Every day | ORAL | Status: DC

## 2014-06-12 MED ORDER — KETOROLAC TROMETHAMINE 30 MG/ML IJ SOLN
INTRAMUSCULAR | Status: AC
  Filled 2014-06-12: qty 1

## 2014-06-12 MED ORDER — SODIUM CHLORIDE 0.9 % IJ SOLN
INTRAMUSCULAR | Status: AC
  Filled 2014-06-12: qty 10

## 2014-06-12 MED ORDER — CHLORHEXIDINE GLUCONATE 4 % EX LIQD: 1.0000 "application " | Freq: Once | CUTANEOUS | Status: DC

## 2014-06-12 MED ORDER — PROPOFOL 10 MG/ML IV BOLUS
INTRAVENOUS | Status: DC | PRN
  Administered 2014-06-12: 200 mg via INTRAVENOUS

## 2014-06-12 MED ORDER — OXYCODONE HCL 5 MG/5ML PO SOLN: 5.0000 mg | Freq: Once | ORAL | Status: AC | PRN

## 2014-06-12 MED ORDER — NYSTATIN 100000 UNIT/ML MT SUSP: 5.0000 mL | Freq: Four times a day (QID) | OROMUCOSAL | Status: DC

## 2014-06-12 MED ORDER — FENTANYL CITRATE (PF) 100 MCG/2ML IJ SOLN
INTRAMUSCULAR | Status: DC | PRN
  Administered 2014-06-12 (×3): 50 ug via INTRAVENOUS
  Administered 2014-06-12 (×2): 25 ug via INTRAVENOUS
  Administered 2014-06-12: 100 ug via INTRAVENOUS

## 2014-06-12 MED ORDER — CEFAZOLIN SODIUM 1-5 GM-% IV SOLN
INTRAVENOUS | Status: AC
  Filled 2014-06-12: qty 150

## 2014-06-12 MED ORDER — CEFAZOLIN SODIUM-DEXTROSE 2-3 GM-% IV SOLR
INTRAVENOUS | Status: AC
  Filled 2014-06-12: qty 50

## 2014-06-12 MED ORDER — FLUTICASONE PROPIONATE 50 MCG/ACT NA SUSP: 2.0000 | NASAL | Status: DC | PRN

## 2014-06-12 MED ORDER — KETOROLAC TROMETHAMINE 30 MG/ML IJ SOLN
30.0000 mg | Freq: Three times a day (TID) | INTRAMUSCULAR | Status: AC
  Administered 2014-06-12 – 2014-06-13 (×3): 30 mg via INTRAVENOUS

## 2014-06-12 MED ORDER — HEPARIN SODIUM (PORCINE) 5000 UNIT/ML IJ SOLN
5000.0000 [IU] | Freq: Once | INTRAMUSCULAR | Status: AC
  Administered 2014-06-12: 5000 [IU] via SUBCUTANEOUS

## 2014-06-12 MED ORDER — DEXAMETHASONE SODIUM PHOSPHATE 4 MG/ML IJ SOLN
INTRAMUSCULAR | Status: DC | PRN
  Administered 2014-06-12: 10 mg via INTRAVENOUS

## 2014-06-12 MED ORDER — HEPARIN SODIUM (PORCINE) 5000 UNIT/ML IJ SOLN
INTRAMUSCULAR | Status: AC
  Filled 2014-06-12: qty 1

## 2014-06-12 MED ORDER — BUPIVACAINE LIPOSOME 1.3 % IJ SUSP
INTRAMUSCULAR | Status: AC
  Filled 2014-06-12: qty 20

## 2014-06-12 MED ORDER — LACTATED RINGERS IV SOLN
INTRAVENOUS | Status: DC
  Administered 2014-06-12 (×2): via INTRAVENOUS

## 2014-06-12 MED ORDER — OXYCODONE HCL 5 MG PO TABS: 5.0000 mg | ORAL_TABLET | Freq: Once | ORAL | Status: AC | PRN

## 2014-06-12 MED ORDER — BUPIVACAINE LIPOSOME 1.3 % IJ SUSP: INTRAMUSCULAR | Status: DC | PRN

## 2014-06-12 MED ORDER — SUCCINYLCHOLINE CHLORIDE 20 MG/ML IJ SOLN
INTRAMUSCULAR | Status: DC | PRN
  Administered 2014-06-12: 140 mg via INTRAVENOUS

## 2014-06-12 MED ORDER — FENTANYL CITRATE (PF) 100 MCG/2ML IJ SOLN
INTRAMUSCULAR | Status: AC
  Filled 2014-06-12: qty 6

## 2014-06-12 MED ORDER — ONDANSETRON HCL 4 MG PO TABS: 4.0000 mg | ORAL_TABLET | Freq: Four times a day (QID) | ORAL | Status: DC | PRN

## 2014-06-12 MED ORDER — MIDAZOLAM HCL 2 MG/2ML IJ SOLN
INTRAMUSCULAR | Status: AC
  Filled 2014-06-12: qty 2

## 2014-06-12 MED ORDER — CYCLOSPORINE 0.05 % OP EMUL: 1.0000 [drp] | Freq: Two times a day (BID) | OPHTHALMIC | Status: DC

## 2014-06-12 MED ORDER — SCOPOLAMINE 1 MG/3DAYS TD PT72
1.0000 | MEDICATED_PATCH | TRANSDERMAL | Status: DC
  Administered 2014-06-12: 1.5 mg via TRANSDERMAL

## 2014-06-12 MED ORDER — SODIUM CHLORIDE 0.9 % IV SOLN
INTRAVENOUS | Status: DC | PRN
  Administered 2014-06-12: 100 mL

## 2014-06-12 MED ORDER — ALBUTEROL SULFATE HFA 108 (90 BASE) MCG/ACT IN AERS: 2.0000 | INHALATION_SPRAY | Freq: Four times a day (QID) | RESPIRATORY_TRACT | Status: DC | PRN

## 2014-06-12 SURGICAL SUPPLY — 67 items
APL SKNCLS STERI-STRIP NONHPOA (GAUZE/BANDAGES/DRESSINGS) ×1
APPLIER CLIP 11 MED OPEN (CLIP) ×3
APPLIER CLIP 9.375 MED OPEN (MISCELLANEOUS) ×3
APR CLP MED 11 20 MLT OPN (CLIP) ×1
APR CLP MED 9.3 20 MLT OPN (MISCELLANEOUS) ×1
BENZOIN TINCTURE PRP APPL 2/3 (GAUZE/BANDAGES/DRESSINGS) ×2 IMPLANT
BINDER ABDOMINAL 12 ML 46-62 (SOFTGOODS) ×5 IMPLANT
BLADE CLIPPER SURG (BLADE) ×2 IMPLANT
BLADE SURG 10 STRL SS (BLADE) ×5 IMPLANT
BLADE SURG 11 STRL SS (BLADE) ×2 IMPLANT
BLADE SURG 15 STRL LF DISP TIS (BLADE) IMPLANT
BLADE SURG 15 STRL SS (BLADE)
CANISTER SUCTION 2500CC (MISCELLANEOUS) ×3 IMPLANT
CHLORAPREP W/TINT 26ML (MISCELLANEOUS) ×3 IMPLANT
CLIP APPLIE 11 MED OPEN (CLIP) IMPLANT
CLIP APPLIE 9.375 MED OPEN (MISCELLANEOUS) IMPLANT
CORDS BIPOLAR (ELECTRODE) IMPLANT
COVER BACK TABLE 60X90IN (DRAPES) ×5 IMPLANT
COVER MAYO STAND STRL (DRAPES) ×5 IMPLANT
DRAIN CHANNEL 15F RND FF W/TCR (WOUND CARE) ×4 IMPLANT
DRAIN CHANNEL 19F RND (DRAIN) ×2 IMPLANT
DRAPE LAPAROSCOPIC ABDOMINAL (DRAPES) ×1 IMPLANT
DRAPE UNIVERSAL PACK (DRAPES) ×2 IMPLANT
DRSG PAD ABDOMINAL 8X10 ST (GAUZE/BANDAGES/DRESSINGS) ×15 IMPLANT
ELECT BLADE 4.0 EZ CLEAN MEGAD (MISCELLANEOUS) ×3
ELECT COATED BLADE 2.86 ST (ELECTRODE) ×3 IMPLANT
ELECT REM PT RETURN 9FT ADLT (ELECTROSURGICAL) ×3
ELECTRODE BLDE 4.0 EZ CLN MEGD (MISCELLANEOUS) IMPLANT
ELECTRODE REM PT RTRN 9FT ADLT (ELECTROSURGICAL) ×1 IMPLANT
EVACUATOR SILICONE 100CC (DRAIN) ×4 IMPLANT
GAUZE SPONGE 4X4 12PLY STRL (GAUZE/BANDAGES/DRESSINGS) IMPLANT
GAUZE XEROFORM 1X8 LF (GAUZE/BANDAGES/DRESSINGS) ×2 IMPLANT
GLOVE BIO SURGEON STRL SZ 6 (GLOVE) ×5 IMPLANT
GLOVE BIOGEL PI IND STRL 7.0 (GLOVE) IMPLANT
GLOVE BIOGEL PI INDICATOR 7.0 (GLOVE) ×8
GOWN STRL REUS W/ TWL LRG LVL3 (GOWN DISPOSABLE) ×2 IMPLANT
GOWN STRL REUS W/TWL LRG LVL3 (GOWN DISPOSABLE) ×6
LIQUID BAND (GAUZE/BANDAGES/DRESSINGS) ×8 IMPLANT
NDL SAFETY ECLIPSE 18X1.5 (NEEDLE) IMPLANT
NEEDLE HYPO 18GX1.5 SHARP (NEEDLE) ×3
NS IRRIG 1000ML POUR BTL (IV SOLUTION) ×5 IMPLANT
PACK BASIN DAY SURGERY FS (CUSTOM PROCEDURE TRAY) ×3 IMPLANT
PIN SAFETY STERILE (MISCELLANEOUS) ×2 IMPLANT
SPONGE LAP 18X18 X RAY DECT (DISPOSABLE) ×10 IMPLANT
STAPLER VISISTAT 35W (STAPLE) ×5 IMPLANT
SUT CAPIO POLYGLYCOLIC (SUTURE) ×4 IMPLANT
SUT ETHILON 2 0 FS 18 (SUTURE) ×6 IMPLANT
SUT MNCRL AB 4-0 PS2 18 (SUTURE) ×6 IMPLANT
SUT MON AB 5-0 PS2 18 (SUTURE) IMPLANT
SUT PDS 3-0 CT2 (SUTURE)
SUT PDS AB 0 CT 36 (SUTURE) ×8 IMPLANT
SUT PDS AB 2-0 CT2 27 (SUTURE) IMPLANT
SUT PDS II 3-0 CT2 27 ABS (SUTURE) IMPLANT
SUT PLAIN 5 0 P 3 18 (SUTURE) ×2 IMPLANT
SUT QUILL PDO 2-0 (SUTURE) IMPLANT
SUT VIC AB 3-0 PS1 18 (SUTURE)
SUT VIC AB 3-0 PS1 18XBRD (SUTURE) IMPLANT
SUT VIC AB 3-0 SH 27 (SUTURE)
SUT VIC AB 3-0 SH 27X BRD (SUTURE) IMPLANT
SYR 20CC LL (SYRINGE) ×2 IMPLANT
SYR BULB IRRIGATION 50ML (SYRINGE) ×3 IMPLANT
TOWEL OR 17X24 6PK STRL BLUE (TOWEL DISPOSABLE) ×5 IMPLANT
TRAY FOLEY CATH SILVER 16FR (SET/KITS/TRAYS/PACK) IMPLANT
TUBE CONNECTING 20'X1/4 (TUBING) ×1
TUBE CONNECTING 20X1/4 (TUBING) ×2 IMPLANT
VAC PENCILS W/TUBING CLEAR (MISCELLANEOUS) ×1 IMPLANT
YANKAUER SUCT BULB TIP NO VENT (SUCTIONS) ×3 IMPLANT

## 2014-06-12 NOTE — Op Note (Signed)
Operative Note   DATE OF OPERATION: 5.20.2016  LOCATION: [facility]-observation  SURGICAL DIVISION: Plastic Surgery  PREOPERATIVE DIAGNOSES:  1. Panniculitis 2.Obesity  POSTOPERATIVE DIAGNOSES:  same  PROCEDURE:  Panniculectomy with umbilical transposition  SURGEON: Irene Limbo MD MBA  ASSISTANT: none  ANESTHESIA:  General.   EBL: 161 ml  COMPLICATIONS: None immediate.   INDICATIONS FOR PROCEDURE:  The patient, [name], is a 64 y.o. female born on 05-Jul-1950, is here for panniculectomy in setting of recurrent yeast infections.   FINDINGS: 3.313 kg resection  DESCRIPTION OF PROCEDURE:  The patient's operative site was marked with the patient in the preoperative area. The patient was taken to the operating room. SCDs were placed and IV antibiotics were given. The patient's operative site was prepped and draped in a sterile fashion. A time out was performed and all information was confirmed to be correct. Low transverse incision made inferior to panniculus. Incision carried through superficial fascia down to abdominal wall. Sub- scarpas dissection completed toward umbilicus. Care taken to preserve layer of fatty tissue over underlying abdominal wall fascia. Umbilicus and stalk freed sharply from overlying skin flap. Superior extent for resection marked by palpation through elevated skin-subcutaneous tissue. Panniculus resected. Wounds irrigated and hemostasis obtained. 15 Fr drain placed percutaneously bilaterally and secured with 2-0 nylon. Superiorly based U - shaped incision made in midline at lever of superior iliac spines and umbilicus delivered through this incision. Low transverse incision closed in layers with 0-PDS in Scarpas fascia. V-lock 0 suture used to approximate dermis. 4-0 monocryl subcuticular used for skin closure. Umbilicus inset with 4-0 monocryl in dermis and running 5-0 plain gut for skin closure. Xeroform applied as bolster in umbilicus. Tissue  adhesive applied to other incisions, followed by dry dressing, abdominal binder.   The patient was allowed to wake from anesthesia, extubated and taken to the recovery room in satisfactory condition.   SPECIMENS: none  DRAINS: 15 Fr JP in right and left subcutaneous abdomen  Irene Limbo, MD [facility] Plastic & Reconstructive Surgery [phone]

## 2014-06-12 NOTE — Anesthesia Preprocedure Evaluation (Addendum)
Anesthesia Evaluation  Patient identified by MRN, date of birth, ID band Patient awake    Reviewed: Allergy & Precautions, NPO status , Patient's Chart, lab work & pertinent test results  Airway Mallampati: I  TM Distance: >3 FB Neck ROM: Full    Dental  (+) Teeth Intact, Dental Advisory Given   Pulmonary asthma , sleep apnea , COPD 02-06-14 Chest X-ray There is no pneumonia. Mild stable hyperinflation is consistent with chronic reactive airway disease. breath sounds clear to auscultation        Cardiovascular hypertension, Pt. on medications + DOE + Valvular Problems/Murmurs MVP Rhythm:Regular Rate:Normal     Neuro/Psych  Headaches, PSYCHIATRIC DISORDERS Anxiety Depression    GI/Hepatic GERD-  Medicated and Controlled,  Endo/Other  Hypothyroidism Morbid obesity  Renal/GU      Musculoskeletal  (+) Arthritis -, Osteoarthritis,  Fibromyalgia -, narcotic dependent  Abdominal   Peds  Hematology   Anesthesia Other Findings   Reproductive/Obstetrics                       Anesthesia Physical Anesthesia Plan  ASA: II  Anesthesia Plan: General   Post-op Pain Management:    Induction: Intravenous  Airway Management Planned: Oral ETT  Additional Equipment:   Intra-op Plan:   Post-operative Plan: Extubation in OR  Informed Consent: I have reviewed the patients History and Physical, chart, labs and discussed the procedure including the risks, benefits and alternatives for the proposed anesthesia with the patient or authorized representative who has indicated his/her understanding and acceptance.   Dental advisory given  Plan Discussed with: CRNA, Anesthesiologist and Surgeon  Anesthesia Plan Comments:         Anesthesia Quick Evaluation

## 2014-06-12 NOTE — Transfer of Care (Signed)
Immediate Anesthesia Transfer of Care Note  Patient: [name]  Procedure(s) Performed: Procedure(s): PANNICULECTOMY (N/A)  Patient Location: PACU  Anesthesia Type:General  Level of Consciousness: awake, alert , oriented and patient cooperative  Airway & Oxygen Therapy: Patient Spontanous Breathing and Patient connected to face mask oxygen  Post-op Assessment: Report given to RN and Post -op Vital signs reviewed and stable  Post vital signs: Reviewed and stable  Last Vitals:  Filed Vitals:   06/12/14 0646  BP: 123/69  Pulse: 89  Temp: 36.9 C  Resp: 20    Complications: No apparent anesthesia complications

## 2014-06-12 NOTE — Interval H&P Note (Signed)
History and Physical Interval Note:  06/12/2014 6:42 AM  [name]  has presented today for surgery, with the diagnosis of panniculitis  The various methods of treatment have been discussed with the patient and family. After consideration of risks, benefits and other options for treatment, the patient has consented to  Procedure(s): PANNICULECTOMY (N/A) as a surgical intervention .  The patient's history has been reviewed, patient examined, no change in status, stable for surgery.  I have reviewed the patient's chart and labs.  Questions were answered to the patient's satisfaction.     [name]

## 2014-06-12 NOTE — Anesthesia Procedure Notes (Addendum)
Procedure Name: Intubation Date/Time: 06/12/2014 7:46 AM Performed by: Wanita Chamberlain Pre-anesthesia Checklist: Patient identified, Timeout performed, Emergency Drugs available, Suction available and Patient being monitored Patient Re-evaluated:Patient Re-evaluated prior to inductionOxygen Delivery Method: Circle system utilized Preoxygenation: Pre-oxygenation with 100% oxygen Intubation Type: IV induction Ventilation: Mask ventilation without difficulty Laryngoscope Size: Mac and 3 Grade View: Grade II Tube type: Oral Tube size: 7.0 mm Number of attempts: 1 Airway Equipment and Method: Stylet Placement Confirmation: positive ETCO2,  ETT inserted through vocal cords under direct vision and breath sounds checked- equal and bilateral Secured at: 21 cm Tube secured with: Tape Dental Injury: Teeth and Oropharynx as per pre-operative assessment  Comments: Pharynx soft tissue reddened appearance upon laryngoscopy

## 2014-06-12 NOTE — Telephone Encounter (Signed)
Pt was unavailable will call her back next week to discuss the below.

## 2014-06-13 DIAGNOSIS — M793 Panniculitis, unspecified: Secondary | ICD-10-CM | POA: Diagnosis not present

## 2014-06-13 MED ORDER — KETOROLAC TROMETHAMINE 30 MG/ML IJ SOLN
INTRAMUSCULAR | Status: AC
  Filled 2014-06-13: qty 1

## 2014-06-15 ENCOUNTER — Encounter (HOSPITAL_BASED_OUTPATIENT_CLINIC_OR_DEPARTMENT_OTHER): Payer: Self-pay | Admitting: Plastic Surgery

## 2014-06-17 ENCOUNTER — Ambulatory Visit: Payer: Medicare Other | Admitting: Gastroenterology

## 2014-06-19 NOTE — Telephone Encounter (Signed)
Left message for pt to call.

## 2014-06-23 NOTE — Anesthesia Postprocedure Evaluation (Signed)
  Anesthesia Post-op Note  Patient: [name]  Procedure(s) Performed: Procedure(s): PANNICULECTOMY (N/A)  Patient Location: PACU  Anesthesia Type: General   Level of Consciousness: awake, alert  and oriented  Airway and Oxygen Therapy: Patient Spontanous Breathing  Post-op Pain: mild  Post-op Assessment: Post-op Vital signs reviewed  Post-op Vital Signs: Reviewed  Last Vitals:  Filed Vitals:   06/13/14 0715  BP: 109/69  Pulse: 61  Temp: 36.3 C  Resp: 16    Complications: No apparent anesthesia complications

## 2014-07-03 NOTE — Telephone Encounter (Signed)
I informed pt with the below note, will have this scheduled in August per TF.

## 2014-07-06 ENCOUNTER — Encounter: Payer: Self-pay | Admitting: Gastroenterology

## 2014-08-01 ENCOUNTER — Ambulatory Visit: Payer: Medicare Other | Admitting: Family Medicine

## 2014-08-10 ENCOUNTER — Telehealth: Payer: Self-pay | Admitting: Gastroenterology

## 2014-08-10 NOTE — Telephone Encounter (Signed)
Patient reports that she is having pain in her lower abdomen and has blisters in the back of her throat.  I have advised that she should see her primary care about her throat as this is outside of the area we treat.  She will come in and see Tye Savoy RNP on 09/19/14 1:30

## 2014-08-10 NOTE — Telephone Encounter (Signed)
Left message for patient to call back  

## 2014-08-14 NOTE — Addendum Note (Signed)
Addended by: Thamas Jaegers on: 08/14/2014 08:36 AM   Modules accepted: Orders

## 2014-08-19 ENCOUNTER — Ambulatory Visit (INDEPENDENT_AMBULATORY_CARE_PROVIDER_SITE_OTHER)
Admission: RE | Admit: 2014-08-19 | Discharge: 2014-08-19 | Disposition: A | Discharge status: Home / Self Care | Payer: Medicare Other | Source: Ambulatory Visit | Attending: Nurse Practitioner | Admitting: Nurse Practitioner

## 2014-08-19 ENCOUNTER — Ambulatory Visit (INDEPENDENT_AMBULATORY_CARE_PROVIDER_SITE_OTHER): Payer: Medicare Other | Admitting: Nurse Practitioner

## 2014-08-19 ENCOUNTER — Encounter: Payer: Self-pay | Admitting: Nurse Practitioner

## 2014-08-19 VITALS — BP 118/74 | HR 84 | Ht 64.17 in | Wt 219.5 lb

## 2014-08-19 DIAGNOSIS — R110 Nausea: Secondary | ICD-10-CM

## 2014-08-19 DIAGNOSIS — R140 Abdominal distension (gaseous): Secondary | ICD-10-CM

## 2014-08-19 DIAGNOSIS — R1032 Left lower quadrant pain: Secondary | ICD-10-CM

## 2014-08-19 MED ORDER — DICYCLOMINE HCL 20 MG PO TABS: 20.0000 mg | ORAL_TABLET | Freq: Two times a day (BID) | ORAL | Status: DC

## 2014-08-19 MED ORDER — LINACLOTIDE 290 MCG PO CAPS: 290.0000 ug | ORAL_CAPSULE | Freq: Every day | ORAL | Status: DC

## 2014-08-19 MED ORDER — ONDANSETRON 4 MG PO TBDP: ORAL_TABLET | ORAL | Status: DC

## 2014-08-19 NOTE — Patient Instructions (Signed)
We have sent the following medications to your pharmacy for you to pick up at your convenience:Zofran, Linzess and Bentyl.  Please go directly to the basement to the x-ray department for your abdominal film.   You have been scheduled for an endoscopy. Please follow written instructions given to you at your visit today. If you use inhalers (even only as needed), please bring them with you on the day of your procedure. Marland Kitchen

## 2014-08-19 NOTE — Progress Notes (Signed)
     History of Present Illness:   Patient is a 64 year old female known to Dr. Fuller Plan. She has IBS-C, GERD and musculoskeletal chest pain. Patient was seen late March with an exacerbation of all these things.   Patient called the office with complaints of recurrent throat infections and lower abdominal pain. She actually has multiple gastrointestinal complaints today.  Nausea / bloating and early satiety: Symptoms present for 6 months now. Patient doesn't vomit but has frequent nausea whether she eats or not. No recent medication changes. She is on chronic narcotics but they have not caused nausea in the past (except when she once tried to increase Fentanyll dose).   Altered bowel patterns: Patient states her has constipation alternating with periods of frequent formed stools. Currently she is having frequent bowel movements, mostly postprandial and associated with urgency. She takes MiraLAX almost on a daily basis. Cannot tolerate over-the-counter laxatives, they cause abdominal pain and vomiting.  Crampy left lower quadrant pain. This is been present for several months. Dicyclomine is not helping at all. Pain is intermittent, not related to eating, defecation, or physical activity. Pain can be quite severe at times.   Recurrent throat infections. Patient describes red bumps with white exudates.  She recently took nystatin for a week and symptoms have now cleared but she wants to know reason for this recurrent problem  Current Medications, Allergies, Past Medical History, Past Surgical History, Family History and Social History were reviewed in [employer] record.  Physical Exam: General: Pleasant, obese black female in no acute distress Head: Normocephalic and atraumatic Eyes:  sclerae anicteric, conjunctiva pink  Ears: Normal auditory acuity Lungs: Clear throughout to auscultation Heart: Regular rate and rhythm Abdomen: Soft, non distended, non-tender. No masses,  no hepatomegaly. Normal bowel sounds Musculoskeletal: Symmetrical with no gross deformities  Extremities: No edema  Neurological: Alert oriented x 4, grossly nonfocal Psychological:  Alert and cooperative. Normal mood and affect  Assessment and Recommendations:   90. 64 year old female with multiple gastrointestinal complaints, see history of present illness. Her altered bowel habits are difficult to sort out. Overall I think she has constipation issues. She has been on MiraLAX for a long time, cannot tolerate laxatives. Will try her on Linzess 240mcg if insurance will pay for it. Her chronic crampy LLQ.  Is likely related to irritable bowel syndrome. The higher dose of Linzess may help with discomfort. Also, will increase Bentyl to 20mg  but change to BID. Of note, CT scan with contrast in April for abdominal pain and nausea was unremarkable.    2. Recurrent throat infections. Her oral exam is negative today. Patient describes small red bumps with right daily. This could be recurrent yeast infections but would need to see when she is actively having problems. Patient just took a course of nystatin.  3. Nausea without vomiting and bloating.  Symptoms present for 5 months. On chronic narcotics but no recent changes to her regimen. Etiology is unclear. Patient very worried. Her abdomen is protuberant but no obstructive bowel sounds or tympany. I don't think she is obstructed but will obtain a KUB to rule out obstruction. I think it will be low yield but will schedule her for EGD for further evaluation as patient is very concerned.    If above interventions not beneficial and EGD negative then patient is interested in obtaining a second opinion.

## 2014-08-20 ENCOUNTER — Telehealth: Payer: Self-pay | Admitting: Nurse Practitioner

## 2014-08-21 DIAGNOSIS — R140 Abdominal distension (gaseous): Secondary | ICD-10-CM | POA: Insufficient documentation

## 2014-08-21 DIAGNOSIS — R110 Nausea: Secondary | ICD-10-CM | POA: Insufficient documentation

## 2014-08-21 DIAGNOSIS — R1032 Left lower quadrant pain: Secondary | ICD-10-CM | POA: Insufficient documentation

## 2014-08-21 NOTE — Progress Notes (Signed)
Reviewed and agree with management plan.  Ramah Langhans T. Fabian Walder, MD FACG 

## 2014-08-21 NOTE — Telephone Encounter (Signed)
Patient notified of the results.  She will keep the appt for EGD on Tuesday

## 2014-08-25 ENCOUNTER — Other Ambulatory Visit: Payer: Self-pay | Admitting: Gynecology

## 2014-08-25 ENCOUNTER — Ambulatory Visit (AMBULATORY_SURGERY_CENTER): Payer: Medicare Other | Admitting: Gastroenterology

## 2014-08-25 ENCOUNTER — Encounter: Payer: Self-pay | Admitting: Gastroenterology

## 2014-08-25 VITALS — BP 135/79 | HR 58 | Temp 98.1°F | Resp 16 | Ht 64.0 in | Wt 219.0 lb

## 2014-08-25 DIAGNOSIS — Z803 Family history of malignant neoplasm of breast: Secondary | ICD-10-CM

## 2014-08-25 DIAGNOSIS — R6881 Early satiety: Secondary | ICD-10-CM

## 2014-08-25 DIAGNOSIS — K219 Gastro-esophageal reflux disease without esophagitis: Secondary | ICD-10-CM

## 2014-08-25 DIAGNOSIS — R110 Nausea: Secondary | ICD-10-CM

## 2014-08-25 DIAGNOSIS — R922 Inconclusive mammogram: Secondary | ICD-10-CM

## 2014-08-25 MED ORDER — SODIUM CHLORIDE 0.9 % IV SOLN: 500.0000 mL | INTRAVENOUS | Status: DC

## 2014-08-25 NOTE — Patient Instructions (Signed)
YOU HAD AN ENDOSCOPIC PROCEDURE TODAY AT [facility]:   Refer to the procedure report that was given to you for any specific questions about what was found during the examination.  If the procedure report does not answer your questions, please call your gastroenterologist to clarify.  If you requested that your care partner not be given the details of your procedure findings, then the procedure report has been included in a sealed envelope for you to review at your convenience later.  YOU SHOULD EXPECT: Some feelings of bloating in the abdomen. Passage of more gas than usual.  Walking can help get rid of the air that was put into your GI tract during the procedure and reduce the bloating. If you had a lower endoscopy (such as a colonoscopy or flexible sigmoidoscopy) you may notice spotting of blood in your stool or on the toilet paper. If you underwent a bowel prep for your procedure, you may not have a normal bowel movement for a few days.  Please Note:  You might notice some irritation and congestion in your nose or some drainage.  This is from the oxygen used during your procedure.  There is no need for concern and it should clear up in a day or so.  SYMPTOMS TO REPORT IMMEDIATELY:    Following upper endoscopy (EGD)  Vomiting of blood or coffee ground material  New chest pain or pain under the shoulder blades  Painful or persistently difficult swallowing  New shortness of breath  Fever of 100F or higher  Black, tarry-looking stools  For urgent or emergent issues, a gastroenterologist can be reached at any hour by calling [phone].   DIET: Your first meal following the procedure should be a small meal and then it is ok to progress to your normal diet. Heavy or fried foods are harder to digest and may make you feel nauseous or bloated.  Likewise, meals heavy in dairy and vegetables can increase bloating.  Drink plenty of fluids but you should avoid alcoholic beverages  for 24 hours.  ACTIVITY:  You should plan to take it easy for the rest of today and you should NOT DRIVE or use heavy machinery until tomorrow (because of the sedation medicines used during the test).    FOLLOW UP: Our staff will call the number listed on your records the next business day following your procedure to check on you and address any questions or concerns that you may have regarding the information given to you following your procedure. If we do not reach you, we will leave a message.  However, if you are feeling well and you are not experiencing any problems, there is no need to return our call.  We will assume that you have returned to your regular daily activities without incident.  If any biopsies were taken you will be contacted by phone or by letter within the next 1-3 weeks.  Please call us at [phone] if you have not heard about the biopsies in 3 weeks.    SIGNATURES/CONFIDENTIALITY: You and/or your care partner have signed paperwork which will be entered into your electronic medical record.  These signatures attest to the fact that that the information above on your After Visit Summary has been reviewed and is understood.  Full responsibility of the confidentiality of this discharge information lies with you and/or your care-partner.   Resume medications. GASTRIC EMPTYING SCAN WILL BE SCHEDULED VIA OFFICE. Information given on esophagitis,reflux,gastritis.
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
## 2014-08-25 NOTE — Progress Notes (Signed)
Called to room to assist during endoscopic procedure.  Patient ID and intended procedure confirmed with present staff. Received instructions for my participation in the procedure from the performing physician.  

## 2014-08-25 NOTE — Telephone Encounter (Signed)
Informed pt order has been placed to contact [place] [facility] to schedule.

## 2014-08-25 NOTE — Progress Notes (Signed)
Report to PACU, RN, vss, BBS= Clear.  

## 2014-08-25 NOTE — Op Note (Signed)
[facility]  [address]. [place], [address]   ENDOSCOPY PROCEDURE REPORT  PATIENT: [name], [name]  MR#: [id number] BIRTHDATE: Nov 20, 1950 , 55  yrs. old GENDER: female ENDOSCOPIST: Ladene Artist, MD, Marval Regal REFERRED BY:  Cathlean Cower, M.D. PROCEDURE DATE:  08/25/2014 PROCEDURE:  EGD w/ biopsy ASA CLASS:     Class III INDICATIONS:  history of esophageal reflux, nausea, and early satiety. MEDICATIONS: Monitored anesthesia care and Propofol 150 mg IV TOPICAL ANESTHETIC: none DESCRIPTION OF PROCEDURE: After the risks benefits and alternatives of the procedure were thoroughly explained, informed consent was obtained.  The LB [id number] endoscope was introduced through the mouth and advanced to the second portion of the duodenum , Without limitations.  The instrument was slowly withdrawn as the mucosa was fully examined.    ESOPHAGUS:  The z line appeared variable and biopsies were taken. There was LA Class A esophagitis (One or more mucosal breaks < 5 mm in maximal length) noted.   The esophagus otherwise appeared normal. STOMACH: Gastritis, mild, non erosive, was found in the gastric fundus and gastric body. Multiple biopsies were performed. The stomach otherwise appeared normal. DUODENUM: The duodenal mucosa showed no abnormalities in the bulb and 2nd part of the duodenum.  Retroflexed views revealed a small hiatal hernia.   The scope was then withdrawn from the patient and the procedure completed.  COMPLICATIONS: There were no immediate complications.  ENDOSCOPIC IMPRESSION: 1.   The z-line appeared variable; biopsies obtained 3.   LA Class A esophagitis 3.   Mild nonerosive gastritis in the gastric fundus and gastric body; multiple biopsies performed 4.   Small hiatal hernia  RECOMMENDATIONS: 1.  Anti-reflux regimen 2.  Await pathology results 3.  Continue PPI bid 4.  Schedule Gastric Emptying Scan.  This is a radiology test that gives an  idea of how well your stomach functions.  eSigned:  Ladene Artist, MD, Marval Regal 08/25/2014 2:26 PM

## 2014-08-26 ENCOUNTER — Telehealth: Payer: Self-pay | Admitting: *Deleted

## 2014-08-26 NOTE — Telephone Encounter (Signed)
  Follow up Call-  Call back number 08/25/2014  Post procedure Call Back phone  # [phone]  Permission to leave phone message Yes     Patient questions:  Do you have a fever, pain , or abdominal swelling? No. Pain Score  0 *  Have you tolerated food without any problems? Yes.    Have you been able to return to your normal activities? Yes.    Do you have any questions about your discharge instructions: Diet   No. Medications  No. Follow up visit  No.  Do you have questions or concerns about your Care? No.  Actions: * If pain score is 4 or above: No action needed, pain <4.

## 2014-08-27 ENCOUNTER — Telehealth: Payer: Self-pay

## 2014-08-27 NOTE — Telephone Encounter (Signed)
Appointment on 09/22/14 @ 4:30pm

## 2014-08-27 NOTE — Telephone Encounter (Signed)
I called [employer] to acquire prior authorization for MRI of breast bilat with and without contrast. CPT 77059. I relayed the info about family history and the details of this that Dr. Loetta Rough had included in his May 2016 CE note.  I gave her the increased breast cancer risk of 38.8% calculated but the [place] geneticist. She could not authorize as it did not meet the criteria she was provided. She said she will have to send it through to physician review and we will receive a judgement in 2 business days.  I will wait to hear.   The REFERENCE # for this case is #[id number].

## 2014-09-01 ENCOUNTER — Telehealth: Payer: Self-pay

## 2014-09-01 NOTE — Telephone Encounter (Signed)
No further information. The [employer] recommends considering MRI in patients who exceed a 20% lifetime risk. She is 38.8%. It seems pretty clear/cut and dry.  I'm not sure a phone call to another physician is going to change anything  If they understand her calculated lifetime risk of 38 98%.  I still recommend to the patient regardless of whether her insurance company pays for to not that she consider having the MRI.

## 2014-09-01 NOTE — Telephone Encounter (Signed)
[facility] more clinical information to be able to prior auth this MRI.  I have already told them the members of her family who had breast cancer and that she had seen genetic counselor and has increased breast cancer risk of 38.8%.  It did not meet criteria for rep to authorize.  I have a feeling it is heading to being denied and at that point you will be able to appeal with a phone call to MD there.  I just wanted to double check with you and see if anything else I can tell them or send them.

## 2014-09-01 NOTE — Telephone Encounter (Signed)
Rep called for "additional clinical information".  We reviewed the info I had already provided and I advised her there was nothing more. I had checked with Dr. Loetta Rough and he said the same and I did advise her regarding his note.  She said she will not forward it on to physician review and we should get a determination in 48 hours.

## 2014-09-05 ENCOUNTER — Encounter: Payer: Self-pay | Admitting: Gastroenterology

## 2014-09-07 ENCOUNTER — Other Ambulatory Visit: Payer: Self-pay

## 2014-09-07 ENCOUNTER — Telehealth: Payer: Self-pay | Admitting: Gastroenterology

## 2014-09-07 DIAGNOSIS — R1084 Generalized abdominal pain: Secondary | ICD-10-CM

## 2014-09-07 NOTE — Telephone Encounter (Signed)
I relayed this info on 09/01/14 to the [facility] clincial reviewer who called. She said I should receive a decision in a couple of days. I will be following up on this today as I have not received word.

## 2014-09-07 NOTE — Telephone Encounter (Signed)
Authorization received via fax today.  Authorization #[id number].

## 2014-09-07 NOTE — Telephone Encounter (Signed)
Left message for pt to call back.  Results reviewed with pt. Pt scheduled for GES at [facility] 09/23/14@7 :30am, pt to arrive at 7:15am. Pt to be NPO after midnight and hold stomach meds. Pt aware.

## 2014-09-22 ENCOUNTER — Ambulatory Visit
Admission: RE | Admit: 2014-09-22 | Discharge: 2014-09-22 | Disposition: A | Discharge status: Home / Self Care | Payer: Medicare Other | Source: Ambulatory Visit | Attending: Gynecology | Admitting: Gynecology

## 2014-09-22 DIAGNOSIS — Z803 Family history of malignant neoplasm of breast: Secondary | ICD-10-CM

## 2014-09-22 MED ORDER — GADOBENATE DIMEGLUMINE 529 MG/ML IV SOLN
20.0000 mL | Freq: Once | INTRAVENOUS | Status: AC | PRN
  Administered 2014-09-22: 20 mL via INTRAVENOUS

## 2014-09-23 ENCOUNTER — Ambulatory Visit (HOSPITAL_COMMUNITY)
Admission: RE | Admit: 2014-09-23 | Discharge: 2014-09-23 | Disposition: A | Discharge status: Home / Self Care | Payer: Medicare Other | Source: Ambulatory Visit | Attending: Gastroenterology | Admitting: Gastroenterology

## 2014-09-23 DIAGNOSIS — R140 Abdominal distension (gaseous): Secondary | ICD-10-CM | POA: Diagnosis not present

## 2014-09-23 DIAGNOSIS — R109 Unspecified abdominal pain: Secondary | ICD-10-CM | POA: Insufficient documentation

## 2014-09-23 DIAGNOSIS — R1084 Generalized abdominal pain: Secondary | ICD-10-CM

## 2014-09-23 MED ORDER — TECHNETIUM TC 99M SULFUR COLLOID
2.1000 | Freq: Once | INTRAVENOUS | Status: DC | PRN
  Administered 2014-09-23: 2.1 via INTRAVENOUS
  Filled 2014-09-23: qty 2.1

## 2014-09-25 ENCOUNTER — Telehealth: Payer: Self-pay

## 2014-09-25 NOTE — Telephone Encounter (Signed)
Patient called and wanted to know which medication Dr. Fuller Plan recommended. She wasn't sure if it was the Goldonna or the IB Guard. Informed patient according to Dr. Lynne Leader recommendations she is to start FD Guard 2 capsules by mouth twice daily 30 minutes before breakfast and dinner x 4-6 weeks. Patient states FD guard is the one she picked up and already started. She states every time she takes the medication it gives her a lump in her throat. She states she thinks the medication is effecting her thyroid. Told her that this medication should not effect her thyroid. Told her to give it a significant trial but to call our office back if her symptoms get worse and to stop the medication. Pt verbalized understanding.

## 2014-11-20 ENCOUNTER — Other Ambulatory Visit: Payer: Self-pay | Admitting: Nurse Practitioner

## 2014-11-24 ENCOUNTER — Emergency Department (HOSPITAL_COMMUNITY): Payer: Medicare Other

## 2014-11-24 ENCOUNTER — Encounter (HOSPITAL_COMMUNITY): Payer: Self-pay | Admitting: *Deleted

## 2014-11-24 ENCOUNTER — Emergency Department (HOSPITAL_COMMUNITY)
Admission: EM | Admit: 2014-11-24 | Discharge: 2014-11-24 | Discharge status: Against Medical Advice | Payer: Medicare Other | Attending: Emergency Medicine | Admitting: Emergency Medicine

## 2014-11-24 DIAGNOSIS — Y9289 Other specified places as the place of occurrence of the external cause: Secondary | ICD-10-CM | POA: Insufficient documentation

## 2014-11-24 DIAGNOSIS — X58XXXA Exposure to other specified factors, initial encounter: Secondary | ICD-10-CM | POA: Diagnosis not present

## 2014-11-24 DIAGNOSIS — Y998 Other external cause status: Secondary | ICD-10-CM | POA: Diagnosis not present

## 2014-11-24 DIAGNOSIS — S299XXA Unspecified injury of thorax, initial encounter: Secondary | ICD-10-CM | POA: Insufficient documentation

## 2014-11-24 DIAGNOSIS — S99922A Unspecified injury of left foot, initial encounter: Secondary | ICD-10-CM | POA: Diagnosis not present

## 2014-11-24 DIAGNOSIS — R011 Cardiac murmur, unspecified: Secondary | ICD-10-CM | POA: Diagnosis not present

## 2014-11-24 DIAGNOSIS — S8991XA Unspecified injury of right lower leg, initial encounter: Secondary | ICD-10-CM | POA: Diagnosis not present

## 2014-11-24 DIAGNOSIS — I10 Essential (primary) hypertension: Secondary | ICD-10-CM | POA: Insufficient documentation

## 2014-11-24 DIAGNOSIS — Y9389 Activity, other specified: Secondary | ICD-10-CM | POA: Insufficient documentation

## 2014-11-24 DIAGNOSIS — J45909 Unspecified asthma, uncomplicated: Secondary | ICD-10-CM | POA: Insufficient documentation

## 2014-11-24 LAB — CBC
HCT: 46.3 % — ABNORMAL HIGH (ref 36.0–46.0)
Hemoglobin: 15.3 g/dL — ABNORMAL HIGH (ref 12.0–15.0)
MCH: 29 pg (ref 26.0–34.0)
MCHC: 33 g/dL (ref 30.0–36.0)
MCV: 87.9 fL (ref 78.0–100.0)
Platelets: 238 10*3/uL (ref 150–400)
RBC: 5.27 MIL/uL — ABNORMAL HIGH (ref 3.87–5.11)
RDW: 14.1 % (ref 11.5–15.5)
WBC: 8.2 10*3/uL (ref 4.0–10.5)

## 2014-11-24 LAB — BASIC METABOLIC PANEL
Anion gap: 6 (ref 5–15)
BUN: 13 mg/dL (ref 6–20)
CO2: 28 mmol/L (ref 22–32)
Calcium: 8.8 mg/dL — ABNORMAL LOW (ref 8.9–10.3)
Chloride: 103 mmol/L (ref 101–111)
Creatinine, Ser: 0.86 mg/dL (ref 0.44–1.00)
GFR calc Af Amer: >60 mL/min (ref >60)
GFR calc non Af Amer: >60 mL/min (ref >60)
Glucose, Bld: 93 mg/dL (ref 65–99)
Potassium: 3.7 mmol/L (ref 3.5–5.1)
Sodium: 137 mmol/L (ref 135–145)

## 2014-11-24 LAB — I-STAT TROPONIN, ED: Troponin i, poc: 0 ng/mL (ref 0.00–0.08)

## 2014-11-24 NOTE — ED Notes (Signed)
Called out patients name to go back to a room X 3,with no reply. Will attempt to call patients name again.

## 2014-11-24 NOTE — ED Notes (Signed)
Pt states that she was on the porch and tripped on a hole. States that her left ankle was twisted and she landed on her right knee. C/o left foot pain and right knee pain. Pt now states that she has been having chest pain since Saturday. States that she was lifting a bag and has had chest pain since.

## 2014-11-25 ENCOUNTER — Telehealth: Payer: Self-pay | Admitting: Internal Medicine

## 2014-11-25 NOTE — Telephone Encounter (Signed)
cxr did not shot any acute illness such as pna or chf (or any other)

## 2014-11-25 NOTE — Telephone Encounter (Signed)
Pt states that she is experiencing "excruiating pain" of the left foot. Pt was seen in the ER last night for same and had xrays done but did not stay to be seen by MD. She is unable to come in for OV due to pain, please advise

## 2014-11-25 NOTE — Telephone Encounter (Signed)
I would not know what to say to this since I did not see her, but she might consider an OV with Dr Tamala Julian asap

## 2014-11-25 NOTE — Telephone Encounter (Signed)
Patient was in ER last night and had chest xray among other things and she never got the results back from the tests She is wondering if you would be able to call her with the results  She can be reached at [phone]

## 2014-11-26 NOTE — Telephone Encounter (Signed)
Pt advised and understood. 

## 2014-12-31 ENCOUNTER — Other Ambulatory Visit: Payer: Self-pay

## 2014-12-31 MED ORDER — NONFORMULARY OR COMPOUNDED ITEM: Status: DC

## 2015-05-24 ENCOUNTER — Other Ambulatory Visit: Payer: Self-pay | Admitting: Internal Medicine

## 2015-05-24 ENCOUNTER — Ambulatory Visit
Admission: RE | Admit: 2015-05-24 | Discharge: 2015-05-24 | Disposition: A | Discharge status: Home / Self Care | Payer: Medicare Other | Source: Ambulatory Visit | Attending: Internal Medicine | Admitting: Internal Medicine

## 2015-05-24 DIAGNOSIS — M25552 Pain in left hip: Principal | ICD-10-CM

## 2015-05-24 DIAGNOSIS — M25551 Pain in right hip: Secondary | ICD-10-CM

## 2015-06-09 ENCOUNTER — Other Ambulatory Visit: Payer: Self-pay | Admitting: Internal Medicine

## 2015-06-17 ENCOUNTER — Other Ambulatory Visit: Payer: Self-pay | Admitting: Gynecology

## 2015-07-07 ENCOUNTER — Other Ambulatory Visit: Payer: Self-pay | Admitting: Internal Medicine

## 2015-07-22 ENCOUNTER — Ambulatory Visit (INDEPENDENT_AMBULATORY_CARE_PROVIDER_SITE_OTHER): Payer: Medicare Other | Admitting: Internal Medicine

## 2015-07-22 ENCOUNTER — Other Ambulatory Visit (INDEPENDENT_AMBULATORY_CARE_PROVIDER_SITE_OTHER): Payer: Medicare Other

## 2015-07-22 ENCOUNTER — Telehealth: Payer: Self-pay

## 2015-07-22 ENCOUNTER — Encounter: Payer: Self-pay | Admitting: Internal Medicine

## 2015-07-22 VITALS — BP 122/74 | HR 87 | Temp 98.0°F | Resp 20 | Wt 223.0 lb

## 2015-07-22 DIAGNOSIS — Z0001 Encounter for general adult medical examination with abnormal findings: Secondary | ICD-10-CM | POA: Diagnosis not present

## 2015-07-22 DIAGNOSIS — R6889 Other general symptoms and signs: Secondary | ICD-10-CM

## 2015-07-22 DIAGNOSIS — Z1159 Encounter for screening for other viral diseases: Secondary | ICD-10-CM

## 2015-07-22 DIAGNOSIS — R197 Diarrhea, unspecified: Secondary | ICD-10-CM | POA: Diagnosis not present

## 2015-07-22 DIAGNOSIS — R7302 Impaired glucose tolerance (oral): Secondary | ICD-10-CM

## 2015-07-22 DIAGNOSIS — D751 Secondary polycythemia: Secondary | ICD-10-CM | POA: Diagnosis not present

## 2015-07-22 DIAGNOSIS — J438 Other emphysema: Secondary | ICD-10-CM | POA: Diagnosis not present

## 2015-07-22 DIAGNOSIS — M79604 Pain in right leg: Secondary | ICD-10-CM

## 2015-07-22 DIAGNOSIS — M549 Dorsalgia, unspecified: Secondary | ICD-10-CM

## 2015-07-22 DIAGNOSIS — M545 Low back pain, unspecified: Secondary | ICD-10-CM | POA: Insufficient documentation

## 2015-07-22 LAB — CBC WITH DIFFERENTIAL/PLATELET
BASOS ABS: 0 10*3/uL (ref 0.0–0.1)
Basophils Relative: 0.2 % (ref 0.0–3.0)
EOS ABS: 0.1 10*3/uL (ref 0.0–0.7)
Eosinophils Relative: 1.3 % (ref 0.0–5.0)
HCT: 48.1 % — ABNORMAL HIGH (ref 36.0–46.0)
Hemoglobin: 15.8 g/dL — ABNORMAL HIGH (ref 12.0–15.0)
LYMPHS ABS: 1.6 10*3/uL (ref 0.7–4.0)
Lymphocytes Relative: 16.9 % (ref 12.0–46.0)
MCHC: 32.8 g/dL (ref 30.0–36.0)
MCV: 87.1 fl (ref 78.0–100.0)
MONO ABS: 0.5 10*3/uL (ref 0.1–1.0)
MONOS PCT: 5.7 % (ref 3.0–12.0)
NEUTROS ABS: 7.1 10*3/uL (ref 1.4–7.7)
NEUTROS PCT: 75.9 % (ref 43.0–77.0)
PLATELETS: 318 10*3/uL (ref 150.0–400.0)
RBC: 5.53 Mil/uL — ABNORMAL HIGH (ref 3.87–5.11)
RDW: 15.3 % (ref 11.5–15.5)
WBC: 9.4 10*3/uL (ref 4.0–10.5)

## 2015-07-22 LAB — URINALYSIS, ROUTINE W REFLEX MICROSCOPIC
BILIRUBIN URINE: NEGATIVE
HGB URINE DIPSTICK: NEGATIVE
Leukocytes, UA: NEGATIVE
Nitrite: NEGATIVE
PH: 5.5 (ref 5.0–8.0)
Specific Gravity, Urine: 1.02 (ref 1.000–1.030)
Total Protein, Urine: NEGATIVE
UROBILINOGEN UA: 0.2 (ref 0.0–1.0)
Urine Glucose: NEGATIVE

## 2015-07-22 LAB — HEMOGLOBIN A1C: Hgb A1c MFr Bld: 5.1 % (ref 4.6–6.5)

## 2015-07-22 MED ORDER — ZONISAMIDE 25 MG PO CAPS: 75.0000 mg | ORAL_CAPSULE | Freq: Every day | ORAL | Status: DC

## 2015-07-22 MED ORDER — LIDOCAINE 5 % EX PTCH: 1.0000 | MEDICATED_PATCH | CUTANEOUS | Status: DC | PRN

## 2015-07-22 MED ORDER — IBUPROFEN 800 MG PO TABS: 800.0000 mg | ORAL_TABLET | Freq: Three times a day (TID) | ORAL | Status: DC | PRN

## 2015-07-22 MED ORDER — NAPROXEN 500 MG PO TABS: 500.0000 mg | ORAL_TABLET | Freq: Every day | ORAL | Status: DC | PRN

## 2015-07-22 MED ORDER — ONDANSETRON 4 MG PO TBDP: ORAL_TABLET | ORAL | Status: DC

## 2015-07-22 MED ORDER — TOPIRAMATE 50 MG PO TABS: 50.0000 mg | ORAL_TABLET | Freq: Two times a day (BID) | ORAL | Status: DC

## 2015-07-22 MED ORDER — LINACLOTIDE 290 MCG PO CAPS: ORAL_CAPSULE | ORAL | Status: DC

## 2015-07-22 MED ORDER — NYSTATIN 100000 UNIT/GM EX POWD: CUTANEOUS | Status: DC

## 2015-07-22 MED ORDER — NYSTATIN 100000 UNIT/GM EX POWD
CUTANEOUS | Status: DC
Start: 2015-07-22 — End: 2015-07-22

## 2015-07-22 MED ORDER — POLYETHYL GLYCOL-PROPYL GLYCOL 0.4-0.3 % OP GEL: 1.0000 "application " | Freq: Every day | OPHTHALMIC | Status: DC

## 2015-07-22 MED ORDER — NONFORMULARY OR COMPOUNDED ITEM: Status: DC

## 2015-07-22 MED ORDER — POLYETHYLENE GLYCOL 3350 17 GM/SCOOP PO POWD: 17.0000 g | ORAL | Status: AC | PRN

## 2015-07-22 NOTE — Assessment & Plan Note (Signed)

## 2015-07-22 NOTE — Assessment & Plan Note (Signed)
C/w sciatica vs facet syndrome, exam benign, for pain control,  to f/u any worsening symptoms or concerns

## 2015-07-22 NOTE — Telephone Encounter (Signed)
Medications sent to new pharmacy 

## 2015-07-22 NOTE — Assessment & Plan Note (Signed)
Asympt, stable overall by history and exam, recent data reviewed with pt, and pt to continue medical treatment as before,  to f/u any worsening symptoms or concerns Lab Results  Component Value Date   HGBA1C 5.2 08/27/2012

## 2015-07-22 NOTE — Assessment & Plan Note (Addendum)
Likely functional, for labs today and GI pathogen panel,  to f/u any worsening symptoms or concerns  In addition to the time spent performing CPE, I spent an additional 25 minutes face to face,in which greater than 50% of this time was spent in counseling and coordination of care for patient's acute illness as documented.

## 2015-07-22 NOTE — Assessment & Plan Note (Signed)
?   Due to osa not well treated, for f/u cbc,  to f/u any worsening symptoms or concerns

## 2015-07-22 NOTE — Assessment & Plan Note (Signed)
stable overall by history and exam, recent data reviewed with pt, and pt to continue medical treatment as before,  to f/u any worsening symptoms or concerns SpO2 Readings from Last 3 Encounters:  07/22/15 92%  11/24/14 96%  08/25/14 95%

## 2015-07-22 NOTE — Progress Notes (Signed)
Pre visit review using our clinic review tool, if applicable. No additional management support is needed unless otherwise documented below in the visit note. 

## 2015-07-22 NOTE — Patient Instructions (Signed)
Please take all new medication as prescribed - the topamax, ibuprofen, and the antifungal powder for the irritation that was discussed  Please continue all other medications as before, and refills have been done if requested.  Please have the pharmacy call with any other refills you may need.  Please continue your efforts at being more active, low cholesterol diet, and weight control.  You are otherwise up to date with prevention measures today.  Please keep your appointments with your specialists as you may have planned  Please go to the LAB in the Basement (turn left off the elevator) for the tests to be done today  You will be contacted by phone if any changes need to be made immediately.  Otherwise, you will receive a letter about your results with an explanation, but please check with MyChart first.  Please remember to sign up for MyChart if you have not done so, as this will be important to you in the future with finding out test results, communicating by private email, and scheduling acute appointments online when needed.  Please return in 6 months, or sooner if needed

## 2015-07-22 NOTE — Progress Notes (Signed)
Subjective:    Patient ID: [name], female    DOB: December 08, 1950, 65 y.o.   MRN: [id number]  HPI  Here for wellness and f/u;  Overall doing ok;  Pt denies Chest pain, worsening SOB, DOE, wheezing, orthopnea, PND, worsening LE edema, palpitations, dizziness or syncope.  Pt denies neurological change such as new headache, facial or extremity weakness.  Pt denies polydipsia, polyuria, or low sugar symptoms. Pt states overall good compliance with treatment and medications, good tolerability, and has been trying to follow appropriate diet.  Pt denies worsening depressive symptoms, suicidal ideation or panic. No fever, night sweats, wt loss, loss of appetite, or other constitutional symptoms.  Pt states good ability with ADL's, has low fall risk, home safety reviewed and adequate, no other significant changes in hearing or vision, and only occasionally active with exercise.  Has recurring migraines, mod to severe, at least weekly, with prevention med not working well.  Asks to go back to topamax.   Does c/o ongoing fatigue, but has had signficant daytime hypersomnolence, could sleep all day. Denies worsening depressive symptoms, suicidal ideation, or panic; has ongoing anxiety, but just doesn't think related to depressoin.  Has been tx for OSA but could not tolerate mask, trying to use mouth guard instead.    Pt continues to have recurring right LBP/right hip pain without bowel or bladder change, fever, wt loss,  worsening LE pain/numbness/weakness, gait change or falls, just pain keeps recurring mild to mod intermittent.  Asks for CBC to f/u hx of polycythemia and ? Poly vera,   Also c/o abd distension, assoc with some nausea today, my digestive symptoms acts like crohns", has recurring crampy pain and then sometimes constipation (has been more constipation in the past, but now more diarrhea_  Pt denies fever, wt loss, night sweats, loss of appetite, or other constitutional symptoms  Wt Readings  from Last 3 Encounters:  07/22/15 223 lb (101.152 kg)  08/25/14 219 lb (99.338 kg)  08/19/14 219 lb 8 oz (99.565 kg)   Also mentions some urinary leakage, not a lot but enough to irriitate skin with itching and mild rash Past Medical History  Diagnosis Date  . Endometriosis   . Asthma   . Fibromyalgia   . Thyroid disease     Hypothyroid  . Elevated cholesterol   . Migraines   . Bowel obstruction ([place])   . GERD (gastroesophageal reflux disease)   . Impaired glucose tolerance 02/25/2011  . Complication of anesthesia     "I have the shakes"  . Heart murmur     mvp  . Shortness of breath     with exertion  . Arthritis   . Low back pain   . Hx of oral aphthous ulcers   . Biliary dyskinesia   . Diverticulosis   . Nocturia   . Hypothyroidism     hx nodule on thyroid   . Depression   . Cervical dysplasia   . Polycythemia vera ([place])   . BRCA negative 02/2013  . Shingles   . Sickle cell trait ([place])   . Hypertension     no med  . Sleep apnea     has not used c-pap x 1 yr. use mouthguard now  . Anxiety    Past Surgical History  Procedure Laterality Date  . Tonsillectomy    . Pelvic laparoscopy  1995,2002    DL LSO DL RSO  . Vaginal hysterectomy  1983  . Breast surgery  Benign breast cyst  . Cholecystectomy  12/05/2011    Procedure: LAPAROSCOPIC CHOLECYSTECTOMY;  Surgeon: Ralene Ok, MD;  Location: [facility];  Service: General;  Laterality: N/A;  . Colposcopy    . Gynecologic cryosurgery    . Oophorectomy      BSO  . Panniculectomy N/A 06/12/2014    Procedure: PANNICULECTOMY;  Surgeon: Irene Limbo, MD;  Location: [facility];  Service: Plastics;  Laterality: N/A;    reports that she has never smoked. She has never used smokeless tobacco. She reports that she drinks alcohol. She reports that she does not use illicit drugs. family history includes Bone cancer in her mother; Breast cancer in her maternal aunt, maternal aunt, maternal grandmother,  mother, and paternal grandmother; Breast cancer (age of onset: 70) in her sister; Colon cancer (age of onset: 49) in her sister; Diabetes in her father; Heart disease in her father and mother; Hypertension in her father and mother; Ovarian cancer in her paternal grandmother; Sarcoidosis in her brother and sister. Allergies  Allergen Reactions  . Celecoxib Nausea Only and Nausea And Vomiting  . Codeine Nausea Only    REACTION: Nausea  . Duloxetine Nausea And Vomiting    REACTION: n/v REACTION: n/v  . Erythromycin     REACTION: Itching  . Escitalopram Oxalate Nausea Only  . Ezetimibe Nausea And Vomiting  . Milnacipran Nausea And Vomiting    REACTION: n/v REACTION: n/v  . Pregabalin Itching and Nausea And Vomiting    REACTION: wt gain REACTION: wt gain  . Rofecoxib Nausea And Vomiting  . Statins Nausea And Vomiting    Body aches  . Tramadol Nausea Only  . Contrast Media [Iodinated Diagnostic Agents] Hives    Patient has history of hives from Ionic contrast injection in 1980's. No allergy to Non-Ionic contrast. 05-09-2014 rsm   Current Outpatient Prescriptions on File Prior to Visit  Medication Sig Dispense Refill  . albuterol (PROVENTIL HFA;VENTOLIN HFA) 108 (90 BASE) MCG/ACT inhaler Inhale 2 puffs into the lungs every 6 (six) hours as needed for wheezing. For wheezing 3.7 g 11  . ARMOUR THYROID 30 MG tablet TAKE 1 TABLET BY MOUTH EVERY DAY BEFORE BREAKFAST 30 tablet 0  . aspirin EC 81 MG EC tablet Take 1 tablet (81 mg total) by mouth daily. 30 tablet 0  . Calcium Carbonate-Vitamin D (CALCIUM + D PO) Take 2 tablets by mouth at bedtime.     . cycloSPORINE (RESTASIS) 0.05 % ophthalmic emulsion Place 1 drop into both eyes 2 (two) times daily.    Marland Kitchen dicyclomine (BENTYL) 20 MG tablet Take 1 tablet (20 mg total) by mouth 2 (two) times daily. 60 tablet 1  . FEMRING 0.05 MG/24HR RING INSERT 1 VAGINAL RING EVERY 3 MONTHS 1 each 0  . fentaNYL (DURAGESIC - DOSED MCG/HR) 25 MCG/HR Place 1 patch  onto the skin every 3 (three) days.    . fluticasone (FLONASE) 50 MCG/ACT nasal spray Place 2 sprays into both nostrils as needed. (Patient taking differently: Place 2 sprays into both nostrils as needed for allergies or rhinitis. ) 16 g 11  . hydrochlorothiazide (HYDRODIURIL) 25 MG tablet Take 1 tablet (25 mg total) by mouth daily as needed (for edema). 90 tablet 3  . HYDROcodone-homatropine (HYCODAN) 5-1.5 MG/5ML syrup Take 5 mLs by mouth every 6 (six) hours as needed for cough. 180 mL 0  . hydroquinone 4 % cream Apply 1 each topically daily. On face.    . lansoprazole (PREVACID) 30 MG capsule  Take 1 capsule (30 mg total) by mouth 2 (two) times daily before a meal. 60 capsule 11  . thyroid (ARMOUR) 30 MG tablet Take 1 tablet (30 mg total) by mouth daily before breakfast. 90 tablet 3   No current facility-administered medications on file prior to visit.   Review of Systems Constitutional: Negative for increased diaphoresis, or other activity, appetite or siginficant weight change other than noted HENT: Negative for worsening hearing loss, ear pain, facial swelling, mouth sores and neck stiffness.   Eyes: Negative for other worsening pain, redness or visual disturbance.  Respiratory: Negative for choking or stridor Cardiovascular: Negative for other chest pain and palpitations.  Gastrointestinal: Negative for worsening diarrhea, blood in stool, or abdominal distention Genitourinary: Negative for hematuria, flank pain or change in urine volume.  Musculoskeletal: Negative for myalgias or other joint complaints.  Skin: Negative for other color change and wound or drainage.  Neurological: Negative for syncope and numbness. other than noted Hematological: Negative for adenopathy. or other swelling Psychiatric/Behavioral: Negative for hallucinations, SI, self-injury, decreased concentration or other worsening agitation.      Objective:   Physical Exam BP 122/74 mmHg  Pulse 87  Temp(Src) 98 F  (36.7 C) (Oral)  Resp 20  Wt 223 lb (101.152 kg)  SpO2 92% VS noted,  Constitutional: Pt is oriented to person, place, and time. Appears well-developed and well-nourished, in no significant distress Head: Normocephalic and atraumatic  Eyes: Conjunctivae and EOM are normal. Pupils are equal, round, and reactive to light Right Ear: External ear normal.  Left Ear: External ear normal Nose: Nose normal.  Mouth/Throat: Oropharynx is clear and moist  Neck: Normal range of motion. Neck supple. No JVD present. No tracheal deviation present or significant neck LA or mass Cardiovascular: Normal rate, regular rhythm, normal heart sounds and intact distal pulses.   Pulmonary/Chest: Effort normal and breath sounds without rales or wheezing  Abdominal: Soft. Bowel sounds are normal. NT. No HSM  Musculoskeletal: Normal range of motion. Exhibits no edema Lymphadenopathy: Has no cervical adenopathy.  Spine nontender Neurological: Pt is alert and oriented to person, place, and time. Pt has normal reflexes. No cranial nerve deficit. Motor 5/5 intact Skin: Skin is warm and dry. No rash noted or new ulcers Psychiatric:  Has dysphoric mood and affect. Behavior is normal.     Assessment & Plan:

## 2015-07-23 ENCOUNTER — Encounter: Payer: Self-pay | Admitting: Internal Medicine

## 2015-07-23 LAB — BASIC METABOLIC PANEL
BUN: 18 mg/dL (ref 6–23)
CO2: 32 mEq/L (ref 19–32)
Calcium: 9.5 mg/dL (ref 8.4–10.5)
Chloride: 106 mEq/L (ref 96–112)
Creatinine, Ser: 1.01 mg/dL (ref 0.40–1.20)
GFR: 71.26 mL/min (ref >60.00)
GLUCOSE: 79 mg/dL (ref 70–99)
POTASSIUM: 4.3 meq/L (ref 3.5–5.1)
SODIUM: 140 meq/L (ref 135–145)

## 2015-07-23 LAB — HEPATIC FUNCTION PANEL
ALK PHOS: 58 U/L (ref 39–117)
ALT: 9 U/L (ref 0–35)
AST: 16 U/L (ref 0–37)
Albumin: 3.6 g/dL (ref 3.5–5.2)
BILIRUBIN DIRECT: 0 mg/dL (ref 0.0–0.3)
TOTAL PROTEIN: 7.6 g/dL (ref 6.0–8.3)
Total Bilirubin: 0.3 mg/dL (ref 0.2–1.2)

## 2015-07-23 LAB — LIPID PANEL
CHOLESTEROL: 236 mg/dL — AB (ref 0–200)
HDL: 61.3 mg/dL (ref >39.00)
LDL CALC: 158 mg/dL — AB (ref 0–99)
NonHDL: 174.87
Total CHOL/HDL Ratio: 4
Triglycerides: 82 mg/dL (ref 0.0–149.0)
VLDL: 16.4 mg/dL (ref 0.0–40.0)

## 2015-07-23 LAB — HEPATITIS C ANTIBODY: HCV Ab: NEGATIVE

## 2015-07-23 LAB — TSH: TSH: 4.52 u[IU]/mL — AB (ref 0.35–4.50)

## 2015-07-28 ENCOUNTER — Telehealth: Payer: Self-pay | Admitting: Emergency Medicine

## 2015-07-28 NOTE — Telephone Encounter (Signed)
Patient called to find out lab results from 07/22/15. Please give patient a call with these results. Thanks.

## 2015-07-29 NOTE — Telephone Encounter (Signed)
Letter was done to patient, maybe has not arrived yet  All labs ok except the LDL cholesterol was elevated, but pt has been unable to take statins  Please follow lower chol diet

## 2015-07-29 NOTE — Telephone Encounter (Signed)
Left patient message to give [place] a call back

## 2015-07-29 NOTE — Telephone Encounter (Signed)
Please advise 

## 2015-08-05 ENCOUNTER — Encounter: Payer: Self-pay | Admitting: Gynecology

## 2015-08-05 ENCOUNTER — Other Ambulatory Visit: Payer: Self-pay | Admitting: *Deleted

## 2015-08-05 ENCOUNTER — Telehealth: Payer: Self-pay | Admitting: *Deleted

## 2015-08-05 ENCOUNTER — Ambulatory Visit (INDEPENDENT_AMBULATORY_CARE_PROVIDER_SITE_OTHER): Payer: Medicare Other | Admitting: Gynecology

## 2015-08-05 VITALS — BP 134/84 | Ht 64.0 in | Wt 220.0 lb

## 2015-08-05 DIAGNOSIS — N952 Postmenopausal atrophic vaginitis: Secondary | ICD-10-CM | POA: Diagnosis not present

## 2015-08-05 DIAGNOSIS — Z1272 Encounter for screening for malignant neoplasm of vagina: Secondary | ICD-10-CM

## 2015-08-05 DIAGNOSIS — Z01419 Encounter for gynecological examination (general) (routine) without abnormal findings: Secondary | ICD-10-CM | POA: Diagnosis not present

## 2015-08-05 DIAGNOSIS — Z79890 Hormone replacement therapy (postmenopausal): Secondary | ICD-10-CM

## 2015-08-05 DIAGNOSIS — Z803 Family history of malignant neoplasm of breast: Secondary | ICD-10-CM

## 2015-08-05 DIAGNOSIS — N760 Acute vaginitis: Secondary | ICD-10-CM | POA: Diagnosis not present

## 2015-08-05 MED ORDER — NONFORMULARY OR COMPOUNDED ITEM: Status: DC

## 2015-08-05 MED ORDER — ESTRADIOL ACETATE 0.05 MG/24HR VA RING: 1.0000 | VAGINAL_RING | VAGINAL | Status: DC

## 2015-08-05 MED ORDER — THYROID 30 MG PO TABS: ORAL_TABLET | ORAL | Status: DC

## 2015-08-05 NOTE — Progress Notes (Signed)
KAINOA HEINKEL Nov 22, 1950 [id number]        65 y.o.  [id number]  for breast and pelvic exam. Several issues noted below.  Past medical history,surgical history, problem list, medications, allergies, family history and social history were all reviewed and documented as reviewed in the EPIC chart.  ROS:  Performed with pertinent positives and negatives included in the history, assessment and plan.   Additional significant findings :  none   Exam: [name] assistant Filed Vitals:   08/05/15 1047  BP: 134/84  Height: 5\' 4"  (1.626 m)  Weight: 220 lb (99.791 kg)   General appearance:  Normal affect, orientation and appearance. Skin: Grossly normal HEENT: Without gross lesions.  No cervical or supraclavicular adenopathy. Thyroid normal.  Lungs:  Clear without wheezing, rales or rhonchi Cardiac: RR, without RMG Abdominal:  Soft, nontender, without masses, guarding, rebound, organomegaly or hernia Breasts:  Examined lying and sitting without masses, retractions, discharge or axillary adenopathy. Pelvic:  Ext/BUS/Vagina normal. Pap smear cuff done  Adnexa without masses or tenderness    Anus and perineum normal   Rectovaginal normal sphincter tone without palpated masses or tenderness.    Assessment/Plan:  65 y.o. [id number] female for breast and pelvic exam.   1. postmenopausal/HRT. Patient continues on Femring 0.1 mg. Had try stopping this last year with acceptable hot flushes and sweats. Status post [place] for dysfunctional bleeding. Subsequent LSO 1995 and RSO 2002 for endometriosis and adhesions. I reviewed the latest 2017 NAMS HRT guidelines. As recommended a shared physician/patient discussion was had discussing the potential benefits from a symptom relief standpoint as well as bone health and possible cardiovascular health when started earlier versus risks to include thrombosis such as stroke heart attack DVT breast cancer. Issues of sickle trait and polycythemia vera reviewed  with possible increased risks of thrombosis discussed. Patient feels this is a quality-of-life issue and wants to continue understanding and accepting the risks. Refill 1 year provided. 2. Intermittent vaginitis. Patient notes intermittent yeast infections for which she uses boric acid suppositories as needed which seems to abort the symptoms. Boric acid suppositories 600 mg #30 with [facility]. 3. Osteopenia on DEXA 2012 T score -1.3. Follow up DEXA 2015 normal. We'll plan repeat in another year or 2 given she is continuing on ERT which is beneficial. 4. Mammography 02/2014. Very strong family history of breast cancer. Underwent genetic testing which was negative. Calculated lifetime risk of breast cancer 20.9% per genetic counselor. Had MRI 08/2014 as a screening per American Cancer Society recommendations.  strongly recommended patient schedule her mammogram now she agrees to do so. She also wants to continue with annual MRI screening a more arrange for this early fall she knows that she will hear from them to schedule and if she does not she will call my office. SBE monthly reviewed. 5. Pap smear 2013. Pap smear of vaginal cuff today. She does have some history of dysplasia per Dr. Randa Evens note note. 6. Colonoscopy 2013. Repeat at their recommended interval. 7. Health maintenance. No routine lab work done as this is done elsewhere. Follow up 1 year, sooner as needed.  Greater than 10 minutes of my time in excess of her breast and pelvic exam was spent in direct face to face counseling and coordination of care in regards to her problems of vaginitis and discussion of HRT and the new 2017 NAMS guidelines.    Anastasio Auerbach MD, 11:37 AM 08/05/2015

## 2015-08-05 NOTE — Telephone Encounter (Signed)
-----   Message from Anastasio Auerbach, MD sent at 08/05/2015 11:15 AM EDT ----- call in refill for boric acid suppositories 600 mg intravaginal as needed for yeast #30 with [facility]

## 2015-08-05 NOTE — Telephone Encounter (Signed)
Rx called in 

## 2015-08-05 NOTE — Addendum Note (Signed)
Addended by: Nelva Nay on: 08/05/2015 12:04 PM   Modules accepted: Orders

## 2015-08-05 NOTE — Patient Instructions (Signed)
Schedule your mammogram.  Office will call you to arrange for the MRI of the breast.  Office will call in a refill for your boric acid suppositories.

## 2015-08-06 ENCOUNTER — Telehealth: Payer: Self-pay | Admitting: Internal Medicine

## 2015-08-06 LAB — PAP IG W/ RFLX HPV ASCU

## 2015-08-06 NOTE — Telephone Encounter (Signed)
Pt called in about her thyroid med. She said that she is concerned that her number for her thyroid has went up but her dose of med has not changed.  She wants to know why it did not change her meds?

## 2015-08-06 NOTE — Telephone Encounter (Signed)
I felt the TSH of 4.52 was not likely significant, since 4.50 is normal.  I would try to continue the same medication dosing. thanks

## 2015-08-06 NOTE — Telephone Encounter (Signed)
Please advise 

## 2015-08-09 NOTE — Telephone Encounter (Signed)
Left message for patient to continue same treatment

## 2015-08-10 ENCOUNTER — Telehealth: Payer: Self-pay | Admitting: Emergency Medicine

## 2015-08-10 DIAGNOSIS — M2550 Pain in unspecified joint: Secondary | ICD-10-CM

## 2015-08-10 NOTE — Telephone Encounter (Signed)
Pt called and want to know who Dr Jenny Reichmann would recommend as a Rheumatologist. She is having a lot of pain and can hardly walk. Please follow up thanks.

## 2015-08-10 NOTE — Telephone Encounter (Signed)
Referral done

## 2015-08-10 NOTE — Telephone Encounter (Signed)
Patient states that she would need a referral for Dr. Valora Piccolo office. Please advise

## 2015-08-10 NOTE — Telephone Encounter (Signed)
Please advise 

## 2015-08-10 NOTE — Telephone Encounter (Signed)
Ok for Dr Amil Amen Jeoffrey Massed in [place], let me know if i need to refer; or at least let me know specifically what joint or what is hurting  - ? Maybe Dr Tamala Julian in this office might be more appropriate

## 2015-08-12 ENCOUNTER — Telehealth: Payer: Self-pay | Admitting: Emergency Medicine

## 2015-08-12 NOTE — Telephone Encounter (Signed)
Pt called and stated Dr Valora Piccolo office needs a referral and medical records in order to schedule an appointment. I stated the referral was sent. She wants [place] to make sure those records get sent over so this can all get handled soon because she is in a lot of pian. Please follow up thanks.

## 2015-08-12 NOTE — Telephone Encounter (Signed)
[facility] Kidney took care of this

## 2015-08-12 NOTE — Telephone Encounter (Signed)
Pt talked to Cecille Rubin that there office has moved and she did not get the referral that was sent on tues.  The referral probably went to there old office. Cecille Rubin told her that she didn't know where the referral needs to be sent and if we needed to know [place] could call them.

## 2015-08-12 NOTE — Telephone Encounter (Signed)
Records from last office visit have been sent over

## 2015-08-14 ENCOUNTER — Encounter (HOSPITAL_COMMUNITY): Payer: Self-pay | Admitting: *Deleted

## 2015-08-14 ENCOUNTER — Emergency Department (HOSPITAL_COMMUNITY)
Admission: EM | Admit: 2015-08-14 | Discharge: 2015-08-14 | Disposition: A | Discharge status: Home / Self Care | Payer: Medicare Other | Attending: Emergency Medicine | Admitting: Emergency Medicine

## 2015-08-14 ENCOUNTER — Emergency Department (HOSPITAL_COMMUNITY): Payer: Medicare Other

## 2015-08-14 DIAGNOSIS — E039 Hypothyroidism, unspecified: Secondary | ICD-10-CM | POA: Diagnosis not present

## 2015-08-14 DIAGNOSIS — M1990 Unspecified osteoarthritis, unspecified site: Secondary | ICD-10-CM | POA: Diagnosis not present

## 2015-08-14 DIAGNOSIS — F329 Major depressive disorder, single episode, unspecified: Secondary | ICD-10-CM | POA: Insufficient documentation

## 2015-08-14 DIAGNOSIS — Z79899 Other long term (current) drug therapy: Secondary | ICD-10-CM | POA: Diagnosis not present

## 2015-08-14 DIAGNOSIS — R109 Unspecified abdominal pain: Secondary | ICD-10-CM | POA: Insufficient documentation

## 2015-08-14 DIAGNOSIS — J45909 Unspecified asthma, uncomplicated: Secondary | ICD-10-CM | POA: Diagnosis not present

## 2015-08-14 DIAGNOSIS — I10 Essential (primary) hypertension: Secondary | ICD-10-CM | POA: Insufficient documentation

## 2015-08-14 DIAGNOSIS — Z7982 Long term (current) use of aspirin: Secondary | ICD-10-CM | POA: Diagnosis not present

## 2015-08-14 LAB — CBC WITH DIFFERENTIAL/PLATELET
BASOS ABS: 0 10*3/uL (ref 0.0–0.1)
BASOS PCT: 0 %
EOS ABS: 0 10*3/uL (ref 0.0–0.7)
Eosinophils Relative: 0 %
HCT: 47.8 % — ABNORMAL HIGH (ref 36.0–46.0)
HEMOGLOBIN: 16.2 g/dL — AB (ref 12.0–15.0)
Lymphocytes Relative: 20 %
Lymphs Abs: 2.2 10*3/uL (ref 0.7–4.0)
MCH: 29.6 pg (ref 26.0–34.0)
MCHC: 33.9 g/dL (ref 30.0–36.0)
MCV: 87.2 fL (ref 78.0–100.0)
MONOS PCT: 6 %
Monocytes Absolute: 0.7 10*3/uL (ref 0.1–1.0)
NEUTROS PCT: 74 %
Neutro Abs: 8.2 10*3/uL — ABNORMAL HIGH (ref 1.7–7.7)
Platelets: 247 10*3/uL (ref 150–400)
RBC: 5.48 MIL/uL — AB (ref 3.87–5.11)
RDW: 14.9 % (ref 11.5–15.5)
WBC: 11.1 10*3/uL — AB (ref 4.0–10.5)

## 2015-08-14 LAB — URINE MICROSCOPIC-ADD ON

## 2015-08-14 LAB — BASIC METABOLIC PANEL
Anion gap: 6 (ref 5–15)
BUN: 16 mg/dL (ref 6–20)
CALCIUM: 8.8 mg/dL — AB (ref 8.9–10.3)
CO2: 24 mmol/L (ref 22–32)
CREATININE: 1.08 mg/dL — AB (ref 0.44–1.00)
Chloride: 112 mmol/L — ABNORMAL HIGH (ref 101–111)
GFR, EST NON AFRICAN AMERICAN: 54 mL/min — AB (ref >60)
Glucose, Bld: 104 mg/dL — ABNORMAL HIGH (ref 65–99)
Potassium: 3.5 mmol/L (ref 3.5–5.1)
SODIUM: 142 mmol/L (ref 135–145)

## 2015-08-14 LAB — URINALYSIS, ROUTINE W REFLEX MICROSCOPIC
BILIRUBIN URINE: NEGATIVE
Glucose, UA: NEGATIVE mg/dL
KETONES UR: NEGATIVE mg/dL
LEUKOCYTES UA: NEGATIVE
NITRITE: NEGATIVE
PROTEIN: NEGATIVE mg/dL
Specific Gravity, Urine: 1.021 (ref 1.005–1.030)
pH: 5 (ref 5.0–8.0)

## 2015-08-14 MED ORDER — HYDROCODONE-ACETAMINOPHEN 5-325 MG PO TABS: 1.0000 | ORAL_TABLET | Freq: Four times a day (QID) | ORAL | Status: DC | PRN

## 2015-08-14 MED ORDER — OXYCODONE-ACETAMINOPHEN 5-325 MG PO TABS
2.0000 | ORAL_TABLET | Freq: Once | ORAL | Status: AC
  Administered 2015-08-14: 2 via ORAL
  Filled 2015-08-14: qty 2

## 2015-08-14 NOTE — Discharge Instructions (Signed)
°  Hydrocodone as prescribed as needed for pain.  Follow-up with your primary Dr. in the next week if you are not improving.   Flank Pain Flank pain refers to pain that is located on the side of the body between the upper abdomen and the back. The pain may occur over a short period of time (acute) or may be long-term or reoccurring (chronic). It may be mild or severe. Flank pain can be caused by many things. CAUSES  Some of the more common causes of flank pain include:  Muscle strains.   Muscle spasms.   A disease of your spine (vertebral disk disease).   A lung infection (pneumonia).   Fluid around your lungs (pulmonary edema).   A kidney infection.   Kidney stones.   A very painful skin rash caused by the chickenpox virus (shingles).   Gallbladder disease.  [place] care will depend on the cause of your pain. In general,  Rest as directed by your caregiver.  Drink enough fluids to keep your urine clear or pale yellow.  Only take over-the-counter or prescription medicines as directed by your caregiver. Some medicines may help relieve the pain.  Tell your caregiver about any changes in your pain.  Follow up with your caregiver as directed. SEEK IMMEDIATE MEDICAL CARE IF:   Your pain is not controlled with medicine.   You have new or worsening symptoms.  Your pain increases.   You have abdominal pain.   You have shortness of breath.   You have persistent nausea or vomiting.   You have swelling in your abdomen.   You feel faint or pass out.   You have blood in your urine.  You have a fever or persistent symptoms for more than 2-3 days.  You have a fever and your symptoms suddenly get worse. MAKE SURE YOU:   Understand these instructions.  Will watch your condition.  Will get help right away if you are not doing well or get worse.   This information is not intended to replace advice given to you by your health care  provider. Make sure you discuss any questions you have with your health care provider.   Document Released: 03/02/2005 Document Revised: 10/04/2011 Document Reviewed: 08/24/2011 Elsevier Interactive Patient Education [employer].

## 2015-08-14 NOTE — ED Notes (Signed)
Pt advised she cannot drive herself home after taking percocet; pt verbalizes understanding and states she will call her brother for ride

## 2015-08-14 NOTE — ED Notes (Signed)
Pt states that she has been having groin pain describes as stabbing pain; pt states that she is having pain that radiates down her rt leg; pt c/o rt sided abd and flank pain; pt states that she has a rash to her face and stats "My tongue feels weird"; pt states that she is having abd pain after eating and cramps and then diarrhea; pt states that she has an appt with a Rheumatalogist in Aug; pt states that she has a family hx of Lupus and MS and thinks she may be having some of those issues; pt states that she also has been having increased migraines and that they are coming every week

## 2015-08-14 NOTE — ED Provider Notes (Signed)
CSN: [id number]     Arrival date & time 08/14/15  0129 History   First MD Initiated Contact with Patient 08/14/15 [phone]     Chief Complaint  Patient presents with  . Generalized Pain      (Consider location/radiation/quality/duration/timing/severity/associated sxs/prior Treatment) HPI Comments: Patient is a 65 year old female with past medical history of fibromyalgia, migraine headaches, low back pain, depression. She presents for evaluation of pain in her right flank, right groin that has been worsening over the past several weeks, however has been present for many months. She denies any injury or trauma. She denies any weakness or tingling. She tells me she has been evaluated by her doctor for this, however no cause is been found. She reports no testing has been done. He is also complaining of increased frequency of migraines, a numb sensation to her tongue, episodes of diarrhea and abdominal pain.  The history is provided by the patient.    Past Medical History  Diagnosis Date  . Endometriosis   . Asthma   . Fibromyalgia   . Thyroid disease     Hypothyroid  . Elevated cholesterol   . Migraines   . Bowel obstruction ([place])   . GERD (gastroesophageal reflux disease)   . Impaired glucose tolerance 02/25/2011  . Complication of anesthesia     "I have the shakes"  . Heart murmur     mvp  . Shortness of breath     with exertion  . Arthritis   . Low back pain   . Hx of oral aphthous ulcers   . Biliary dyskinesia   . Diverticulosis   . Nocturia   . Hypothyroidism     hx nodule on thyroid   . Depression   . Cervical dysplasia   . Polycythemia vera ([place])   . BRCA negative 02/2013  . Shingles   . Sickle cell trait ([place])   . Hypertension     no med  . Sleep apnea     has not used c-pap x 1 yr. use mouthguard now  . Anxiety    Past Surgical History  Procedure Laterality Date  . Tonsillectomy    . Pelvic laparoscopy  1995,2002    DL LSO DL RSO  . Vaginal hysterectomy  1983   . Breast surgery      Benign breast cyst  . Cholecystectomy  12/05/2011    Procedure: LAPAROSCOPIC CHOLECYSTECTOMY;  Surgeon: Ralene Ok, MD;  Location: [facility];  Service: General;  Laterality: N/A;  . Colposcopy    . Gynecologic cryosurgery    . Oophorectomy      BSO  . Panniculectomy N/A 06/12/2014    Procedure: PANNICULECTOMY;  Surgeon: Irene Limbo, MD;  Location: [facility];  Service: Plastics;  Laterality: N/A;   Family History  Problem Relation Age of Onset  . Hypertension Mother   . Heart disease Mother   . Breast cancer Mother     Age 1  . Bone cancer Mother   . Hypertension Father   . Heart disease Father   . Diabetes Father   . Colon cancer Sister 61  . Sarcoidosis Sister   . Breast cancer Sister 19  . Breast cancer Maternal Grandmother     Age 49  . Ovarian cancer Paternal Grandmother   . Breast cancer Paternal Grandmother     Age unknown  . Sarcoidosis Brother   . Breast cancer Maternal Aunt     Age 50's  . Breast cancer Maternal Aunt  Age 26   Social History  Substance Use Topics  . Smoking status: Never Smoker   . Smokeless tobacco: Never Used  . Alcohol Use: 0.0 oz/week    0 Standard drinks or equivalent per week     Comment: socially   OB History    Gravida Para Term Preterm AB TAB SAB Ectopic Multiple Living   '3 2 2  1     2     '$ Review of Systems  All other systems reviewed and are negative.     Allergies  Celecoxib; Codeine; Duloxetine; Erythromycin; Escitalopram oxalate; Ezetimibe; Milnacipran; Pregabalin; Rofecoxib; Statins; Tramadol; and Contrast media  Home Medications   Prior to Admission medications   Medication Sig Start Date End Date Taking? Authorizing Provider  albuterol (PROVENTIL HFA;VENTOLIN HFA) 108 (90 BASE) MCG/ACT inhaler Inhale 2 puffs into the lungs every 6 (six) hours as needed for wheezing. For wheezing 02/06/14   Biagio Borg, MD  aspirin EC 81 MG EC tablet Take 1 tablet (81 mg total) by  mouth daily. 03/28/12   Reyne Dumas, MD  Calcium Carbonate-Vitamin D (CALCIUM + D PO) Take 2 tablets by mouth at bedtime.     Historical Provider, MD  cycloSPORINE (RESTASIS) 0.05 % ophthalmic emulsion Place 1 drop into both eyes 2 (two) times daily.    Historical Provider, MD  dicyclomine (BENTYL) 20 MG tablet Take 1 tablet (20 mg total) by mouth 2 (two) times daily. 08/19/14   Willia Craze, NP  Estradiol Acetate ([place]) 0.05 MG/24HR RING Place 1 each vaginally every 3 (three) months. 08/05/15   Anastasio Auerbach, MD  fentaNYL (DURAGESIC - DOSED MCG/HR) 25 MCG/HR Place 1 patch onto the skin every 3 (three) days.    Historical Provider, MD  fluticasone (FLONASE) 50 MCG/ACT nasal spray Place 2 sprays into both nostrils as needed. Patient taking differently: Place 2 sprays into both nostrils as needed for allergies or rhinitis.  02/06/14   Biagio Borg, MD  hydrochlorothiazide (HYDRODIURIL) 25 MG tablet Take 1 tablet (25 mg total) by mouth daily as needed (for edema). 04/29/14   Biagio Borg, MD  HYDROcodone-homatropine [facility]) 5-1.5 MG/5ML syrup Take 5 mLs by mouth every 6 (six) hours as needed for cough. Patient not taking: Reported on 08/05/2015 02/06/14   Biagio Borg, MD  ibuprofen (ADVIL,MOTRIN) 800 MG tablet Take 1 tablet (800 mg total) by mouth every 8 (eight) hours as needed. 07/22/15   Biagio Borg, MD  lansoprazole (PREVACID) 30 MG capsule Take 1 capsule (30 mg total) by mouth 2 (two) times daily before a meal. 04/15/14   Ladene Artist, MD  lidocaine (LIDODERM) 5 % Place 1 patch onto the skin as needed (pain). 07/22/15   Biagio Borg, MD  linaclotide [facility]) 290 MCG CAPS capsule TAKE 1 CAPSULE(290 MCG) BY MOUTH DAILY 07/22/15   Biagio Borg, MD  naproxen (NAPROSYN) 500 MG tablet Take 1 tablet (500 mg total) by mouth daily as needed (migraines). 07/22/15   Biagio Borg, MD  NONFORMULARY OR COMPOUNDED ITEM Boric Acid suppositories 600 mg 08/05/15   Anastasio Auerbach, MD  ondansetron (ZOFRAN  ODT) 4 MG disintegrating tablet '4mg'$  ODT q4 hours prn nausea/vomit 07/22/15   Biagio Borg, MD  Polyethyl Glycol-Propyl Glycol (SYSTANE) 0.4-0.3 % GEL ophthalmic gel Place 1 application into both eyes daily. 07/22/15   Biagio Borg, MD  polyethylene glycol powder [facility]) powder Take 17 g by mouth as needed. For constipation 07/22/15  Biagio Borg, MD  thyroid (ARMOUR) 30 MG tablet Take 1 tablet (30 mg total) by mouth daily before breakfast. 02/06/14   Biagio Borg, MD  topiramate (TOPAMAX) 50 MG tablet Take 1 tablet (50 mg total) by mouth 2 (two) times daily. 07/22/15   Biagio Borg, MD   BP 124/77 mmHg  Pulse 98  Temp(Src) 98.2 F (36.8 C) (Oral)  Resp 18  SpO2 98% Physical Exam  Constitutional: She is oriented to person, place, and time. She appears well-developed and well-nourished. No distress.  HENT:  Head: Normocephalic and atraumatic.  Mouth/Throat: Oropharynx is clear and moist.  Neck: Normal range of motion. Neck supple.  Cardiovascular: Normal rate and regular rhythm.  Exam reveals no gallop and no friction rub.   No murmur heard. Pulmonary/Chest: Effort normal and breath sounds normal. No respiratory distress. She has no wheezes.  Abdominal: Soft. Bowel sounds are normal. She exhibits no distension. There is no tenderness.  Musculoskeletal: Normal range of motion.  Neurological: She is alert and oriented to person, place, and time. No cranial nerve deficit. She exhibits normal muscle tone. Coordination normal.  Skin: Skin is warm and dry. She is not diaphoretic.  Nursing note and vitals reviewed.   ED Course  Procedures (including critical care time) Labs Review Labs Reviewed  URINALYSIS, ROUTINE W REFLEX MICROSCOPIC (NOT AT [facility])  BASIC METABOLIC PANEL  CBC WITH DIFFERENTIAL/PLATELET    Imaging Review No results found. I have personally reviewed and evaluated these images and lab results as part of my medical decision-making.    MDM   Final diagnoses:  Right  flank pain   Patient with history of fibromyalgia presents with Multiple complaints. Her most pressing issue is pain in her right groin, flank, and hip that began several months ago in the absence of any injury or trauma, and has worsened over the past 2 weeks. Her physical examination is unremarkable and workup reveals no obvious cause. A renal CT was obtained which reveals no evidence for kidney stone or other pathology. The remainder of her laboratory studies are essentially unremarkable. There is no infection in her urine.  I am uncertain as to what is causing this discomfort, however do not feel there is anything emergent. I have advised her to follow-up with her primary Dr. to discuss further imaging if she is not improving in the next week.      Veryl Speak, MD 08/14/15 [phone]

## 2015-08-14 NOTE — ED Notes (Signed)
Pt. In xray 

## 2015-08-14 NOTE — ED Notes (Signed)
MD at bedside. 

## 2015-08-19 ENCOUNTER — Encounter: Payer: Self-pay | Admitting: Family

## 2015-08-19 ENCOUNTER — Ambulatory Visit (INDEPENDENT_AMBULATORY_CARE_PROVIDER_SITE_OTHER): Payer: Medicare Other | Admitting: Family

## 2015-08-19 DIAGNOSIS — R1030 Lower abdominal pain, unspecified: Secondary | ICD-10-CM

## 2015-08-19 DIAGNOSIS — R1031 Right lower quadrant pain: Secondary | ICD-10-CM

## 2015-08-19 MED ORDER — HYDROCODONE-ACETAMINOPHEN 5-325 MG PO TABS: 1.0000 | ORAL_TABLET | Freq: Four times a day (QID) | ORAL | 0 refills | Status: DC | PRN

## 2015-08-19 MED ORDER — PREDNISONE 50 MG PO TABS: ORAL_TABLET | ORAL | 0 refills | Status: DC

## 2015-08-19 NOTE — Assessment & Plan Note (Addendum)
Pain of quadriceps right groin most likely muscle related giving decreased pain with passive range of motion. She does have recent history of herpes zoster in a similar area and cannot rule out neuropathic pain or post herpetic pain. Unlikely strain with no significant source. Recommend treatment with ice, exercise therapy and will start prednisone. Consider possible imaging if symptoms worsen or do not improve.

## 2015-08-19 NOTE — Patient Instructions (Signed)
Thank you for choosing [facility].  Summary/Instructions:  Please start the prednisone.  Use the Norco only as needed for uncontrolled pain.  This appears to be nerve related and cannot rule out shingles from the past.   Your prescription(s) have been submitted to your pharmacy or been printed and provided for you. Please take as directed and contact our office if you believe you are having problem(s) with the medication(s) or have any questions.  If your symptoms worsen or fail to improve, please contact our office for further instruction, or in case of emergency go directly to the emergency room at the closest medical facility.

## 2015-08-19 NOTE — Progress Notes (Signed)
Subjective:    Patient ID: [name], female    DOB: April 18, 1950, 65 y.o.   MRN: [id number]  Chief Complaint  Patient presents with  . Hip Pain    right hip pain, x1 month, that pain starts in hip and goes in groin area    HPI:  [name] is a 65 y.o. female who  has a past medical history of Anxiety; Arthritis; Asthma; Biliary dyskinesia; Bowel obstruction ([place]); BRCA negative (02/2013); Cervical dysplasia; Complication of anesthesia; Depression; Diverticulosis; Elevated cholesterol; Endometriosis; Fibromyalgia; GERD (gastroesophageal reflux disease); Heart murmur; oral aphthous ulcers; Hypertension; Hypothyroidism; Impaired glucose tolerance (02/25/2011); Low back pain; Migraines; Nocturia; Polycythemia vera ([place]); Shingles; Shortness of breath; Sickle cell trait ([place]); Sleep apnea; and Thyroid disease. and presents today for a follow up office visit.   Recently evaluated in the ED for the associated symptom of pain that is located in her thigh and groin pain that has now been going on for a couple of weeks. Currently awaiting an appointment with rheumatology. In the ED the renal CT scan was negative as well as urine work-up. Pain is described as "excruiciating". She was prescribed hydrocodone-acetaminphen. Other modifying factors include lidocaine patches, capsacin cream, warm soaks none of which have really helped. Denies any significant trauma. The severity is enough that she has laid in bed most of the time and only gotten up to go to the bathroom. Significant history for fibromyalgia.  Denies any sounds or sensations heard or felt. Does have back pain.   Allergies  Allergen Reactions  . Celecoxib Nausea Only and Nausea And Vomiting  . Codeine Nausea Only    REACTION: Nausea  . Duloxetine Nausea And Vomiting    REACTION: n/v REACTION: n/v  . Erythromycin     REACTION: Itching  . Escitalopram Oxalate Nausea Only  . Ezetimibe Nausea And Vomiting  . Milnacipran Nausea And  Vomiting    REACTION: n/v REACTION: n/v  . Pregabalin Itching and Nausea And Vomiting    REACTION: wt gain REACTION: wt gain  . Rofecoxib Nausea And Vomiting  . Statins Nausea And Vomiting    Body aches  . Tramadol Nausea Only  . Contrast Media [Iodinated Diagnostic Agents] Hives    Patient has history of hives from Ionic contrast injection in 1980's. No allergy to Non-Ionic contrast. 05-09-2014 rsm     Current Outpatient Prescriptions on File Prior to Visit  Medication Sig Dispense Refill  . albuterol (PROVENTIL HFA;VENTOLIN HFA) 108 (90 BASE) MCG/ACT inhaler Inhale 2 puffs into the lungs every 6 (six) hours as needed for wheezing. For wheezing 3.7 g 11  . aspirin EC 81 MG EC tablet Take 1 tablet (81 mg total) by mouth daily. 30 tablet 0  . Calcium Carbonate-Vitamin D (CALCIUM + D PO) Take 2 tablets by mouth at bedtime.     . cycloSPORINE (RESTASIS) 0.05 % ophthalmic emulsion Place 1 drop into both eyes 2 (two) times daily.    Marland Kitchen dicyclomine (BENTYL) 20 MG tablet Take 1 tablet (20 mg total) by mouth 2 (two) times daily. 60 tablet 1  . Estradiol Acetate (FEMRING) 0.05 MG/24HR RING Place 1 each vaginally every 3 (three) months. 1 each 4  . fentaNYL (DURAGESIC - DOSED MCG/HR) 25 MCG/HR Place 1 patch onto the skin every 3 (three) days.    . fluticasone (FLONASE) 50 MCG/ACT nasal spray Place 2 sprays into both nostrils as needed. (Patient taking differently: Place 2 sprays into both nostrils as needed for allergies or  rhinitis. ) 16 g 11  . hydrochlorothiazide (HYDRODIURIL) 25 MG tablet Take 1 tablet (25 mg total) by mouth daily as needed (for edema). 90 tablet 3  . ibuprofen (ADVIL,MOTRIN) 800 MG tablet Take 1 tablet (800 mg total) by mouth every 8 (eight) hours as needed. 60 tablet 3  . lansoprazole (PREVACID) 30 MG capsule Take 1 capsule (30 mg total) by mouth 2 (two) times daily before a meal. 60 capsule 11  . lidocaine (LIDODERM) 5 % Place 1 patch onto the skin as needed (pain). 30 patch 0   . linaclotide (LINZESS) 290 MCG CAPS capsule TAKE 1 CAPSULE(290 MCG) BY MOUTH DAILY 30 capsule 2  . linaclotide (LINZESS) 72 MCG capsule Take 72 mcg by mouth daily before breakfast.    . naproxen (NAPROSYN) 500 MG tablet Take 1 tablet (500 mg total) by mouth daily as needed (migraines). 30 tablet 0  . NONFORMULARY OR COMPOUNDED ITEM Boric Acid suppositories 600 mg 30 each 2  . ondansetron (ZOFRAN ODT) 4 MG disintegrating tablet '4mg'$  ODT q4 hours prn nausea/vomit 30 tablet 0  . Polyethyl Glycol-Propyl Glycol (SYSTANE) 0.4-0.3 % GEL ophthalmic gel Place 1 application into both eyes daily. 10 mL 12  . polyethylene glycol powder (MIRALAX) powder Take 17 g by mouth as needed. For constipation 255 g 0  . thyroid (ARMOUR) 30 MG tablet Take 1 tablet (30 mg total) by mouth daily before breakfast. 90 tablet 3  . topiramate (TOPAMAX) 50 MG tablet Take 1 tablet (50 mg total) by mouth 2 (two) times daily. 60 tablet 11  . zonisamide (ZONEGRAN) 25 MG capsule Take 75 mg by mouth daily.  0   No current facility-administered medications on file prior to visit.      Past Surgical History:  Procedure Laterality Date  . BREAST SURGERY     Benign breast cyst  . CHOLECYSTECTOMY  12/05/2011   Procedure: LAPAROSCOPIC CHOLECYSTECTOMY;  Surgeon: Ralene Ok, MD;  Location: [facility];  Service: General;  Laterality: N/A;  . COLPOSCOPY    . GYNECOLOGIC CRYOSURGERY    . OOPHORECTOMY     BSO  . PANNICULECTOMY N/A 06/12/2014   Procedure: PANNICULECTOMY;  Surgeon: Irene Limbo, MD;  Location: [facility];  Service: Plastics;  Laterality: N/A;  . PELVIC LAPAROSCOPY  1995,2002   DL LSO DL RSO  . TONSILLECTOMY    . VAGINAL HYSTERECTOMY  1983    Past Medical History:  Diagnosis Date  . Anxiety   . Arthritis   . Asthma   . Biliary dyskinesia   . Bowel obstruction ([place])   . BRCA negative 02/2013  . Cervical dysplasia   . Complication of anesthesia    "I have the shakes"  . Depression   .  Diverticulosis   . Elevated cholesterol   . Endometriosis   . Fibromyalgia   . GERD (gastroesophageal reflux disease)   . Heart murmur    mvp  . Hx of oral aphthous ulcers   . Hypertension    no med  . Hypothyroidism    hx nodule on thyroid   . Impaired glucose tolerance 02/25/2011  . Low back pain   . Migraines   . Nocturia   . Polycythemia vera ([place])   . Shingles   . Shortness of breath    with exertion  . Sickle cell trait ([place])   . Sleep apnea    has not used c-pap x 1 yr. use mouthguard now  . Thyroid disease    Hypothyroid  Review of Systems  Constitutional: Negative for chills and fever.  Genitourinary: Negative for dysuria, flank pain, hematuria and urgency.  Musculoskeletal:       Positive for right thigh/groin pain.  Neurological: Negative for weakness and numbness.      Objective:    BP 122/90 (BP Location: Left Arm, Patient Position: Sitting, Cuff Size: Large)   Pulse 80   Temp 98.1 F (36.7 C) (Oral)   Resp 16   Ht '5\' 4"'$  (1.626 m)   Wt 221 lb (100.2 kg)   SpO2 94%   BMI 37.93 kg/m  Nursing note and vital signs reviewed.  Physical Exam  Constitutional: She is oriented to person, place, and time. She appears well-developed and well-nourished. No distress.  Cardiovascular: Normal rate, regular rhythm, normal heart sounds and intact distal pulses.   Pulmonary/Chest: Effort normal and breath sounds normal.  Musculoskeletal:  Right hip/thigh - no obvious deformity, discoloration, with questionable mild edema. There is tenderness elicited over the quadriceps and abductor longus. No crepitus or deformities. Range of motion within normal limits although slightly limited secondary to discomfort. Passive range of motion results in increased range of motion with minimal pain. Distal pulses and sensation are intact and appropriate.  Neurological: She is alert and oriented to person, place, and time.  Skin: Skin is warm and dry.  Psychiatric: She has a normal  mood and affect. Her behavior is normal. Judgment and thought content normal.       Assessment & Plan:   Problem List Items Addressed This Visit      Other   Right groin pain    Pain of quadriceps right groin most likely muscle related giving decreased pain with passive range of motion. She does have recent history of herpes zoster in a similar area and cannot rule out neuropathic pain or post herpetic pain. Unlikely strain with no significant source. Recommend treatment with ice, exercise therapy and will start prednisone. Consider possible imaging if symptoms worsen or do not improve.       Relevant Medications   predniSONE (DELTASONE) 50 MG tablet   HYDROcodone-acetaminophen (NORCO) 5-325 MG tablet    Other Visit Diagnoses   None.      I have discontinued Ms. Mancil's HYDROcodone-homatropine. I am also having her start on predniSONE. Additionally, I am having her maintain her Calcium Carbonate-Vitamin D (CALCIUM + D PO), fentaNYL, aspirin, cycloSPORINE, thyroid, albuterol, fluticasone, lansoprazole, hydrochlorothiazide, dicyclomine, ibuprofen, topiramate, polyethylene glycol powder, Polyethyl Glycol-Propyl Glycol, linaclotide, lidocaine, naproxen, ondansetron, Estradiol Acetate, NONFORMULARY OR COMPOUNDED ITEM, zonisamide, linaclotide, and HYDROcodone-acetaminophen.   Meds ordered this encounter  Medications  . predniSONE (DELTASONE) 50 MG tablet    Sig: Take 1 tablet by mouth daily for 3 days.    Dispense:  3 tablet    Refill:  0    Order Specific Question:   Supervising Provider    Answer:   Pricilla Holm A [[id number]]  . HYDROcodone-acetaminophen (NORCO) 5-325 MG tablet    Sig: Take 1-2 tablets by mouth every 6 (six) hours as needed.    Dispense:  12 tablet    Refill:  0    Order Specific Question:   Supervising Provider    Answer:   Pricilla Holm A [[id number]]     Follow-up: Return if symptoms worsen or fail to improve.  Mauricio Po, FNP

## 2015-08-23 ENCOUNTER — Telehealth: Payer: Self-pay | Admitting: Internal Medicine

## 2015-08-23 DIAGNOSIS — R1031 Right lower quadrant pain: Secondary | ICD-10-CM

## 2015-08-23 NOTE — Telephone Encounter (Signed)
Pt called in and said that she thinks if she had one more round of the prednisone, she would be good.  She wants to know if greg can call in one more round?

## 2015-08-23 NOTE — Telephone Encounter (Signed)
Please advise 

## 2015-08-27 MED ORDER — PREDNISONE 50 MG PO TABS: ORAL_TABLET | ORAL | 0 refills | Status: DC

## 2015-08-27 NOTE — Telephone Encounter (Signed)
Patient called back in regard to this.  States she is really needing.  Marya Amsler has not yet responded.  Can Dr. Jenny Reichmann refill for patient? Thanks!

## 2015-08-27 NOTE — Telephone Encounter (Signed)
Patient uses [facility] on [address].

## 2015-08-27 NOTE — Telephone Encounter (Signed)
Done erx 

## 2015-09-05 ENCOUNTER — Encounter (HOSPITAL_COMMUNITY): Payer: Self-pay | Admitting: Emergency Medicine

## 2015-09-05 ENCOUNTER — Emergency Department (HOSPITAL_COMMUNITY)
Admission: EM | Admit: 2015-09-05 | Discharge: 2015-09-05 | Disposition: A | Discharge status: Home / Self Care | Payer: Medicare Other | Attending: Emergency Medicine | Admitting: Emergency Medicine

## 2015-09-05 ENCOUNTER — Emergency Department (HOSPITAL_COMMUNITY): Payer: Medicare Other

## 2015-09-05 DIAGNOSIS — I10 Essential (primary) hypertension: Secondary | ICD-10-CM | POA: Insufficient documentation

## 2015-09-05 DIAGNOSIS — R1011 Right upper quadrant pain: Secondary | ICD-10-CM | POA: Diagnosis not present

## 2015-09-05 DIAGNOSIS — J45909 Unspecified asthma, uncomplicated: Secondary | ICD-10-CM | POA: Diagnosis not present

## 2015-09-05 DIAGNOSIS — R110 Nausea: Secondary | ICD-10-CM | POA: Insufficient documentation

## 2015-09-05 DIAGNOSIS — R109 Unspecified abdominal pain: Secondary | ICD-10-CM

## 2015-09-05 HISTORY — DX: Migraine, unspecified, not intractable, without status migrainosus: G43.909

## 2015-09-05 HISTORY — DX: Unspecified asthma, uncomplicated: J45.909

## 2015-09-05 LAB — CBC WITH DIFFERENTIAL/PLATELET
BASOS ABS: 0 10*3/uL (ref 0.0–0.1)
BASOS PCT: 0 %
Eosinophils Absolute: 0.1 10*3/uL (ref 0.0–0.7)
Eosinophils Relative: 1 %
HEMATOCRIT: 44.2 % (ref 36.0–46.0)
HEMOGLOBIN: 14.6 g/dL (ref 12.0–15.0)
Lymphocytes Relative: 24 %
Lymphs Abs: 2.4 10*3/uL (ref 0.7–4.0)
MCH: 28.8 pg (ref 26.0–34.0)
MCHC: 33 g/dL (ref 30.0–36.0)
MCV: 87.2 fL (ref 78.0–100.0)
Monocytes Absolute: 0.7 10*3/uL (ref 0.1–1.0)
Monocytes Relative: 7 %
NEUTROS ABS: 6.9 10*3/uL (ref 1.7–7.7)
NEUTROS PCT: 68 %
Platelets: 307 10*3/uL (ref 150–400)
RBC: 5.07 MIL/uL (ref 3.87–5.11)
RDW: 14.9 % (ref 11.5–15.5)
WBC: 10.2 10*3/uL (ref 4.0–10.5)

## 2015-09-05 LAB — COMPREHENSIVE METABOLIC PANEL
ALBUMIN: 3 g/dL — AB (ref 3.5–5.0)
ALK PHOS: 55 U/L (ref 38–126)
ALT: 13 U/L — AB (ref 14–54)
AST: 15 U/L (ref 15–41)
Anion gap: 6 (ref 5–15)
BILIRUBIN TOTAL: 0.2 mg/dL — AB (ref 0.3–1.2)
BUN: 15 mg/dL (ref 6–20)
CALCIUM: 8.2 mg/dL — AB (ref 8.9–10.3)
CO2: 22 mmol/L (ref 22–32)
Chloride: 112 mmol/L — ABNORMAL HIGH (ref 101–111)
Creatinine, Ser: 0.81 mg/dL (ref 0.44–1.00)
GFR calc Af Amer: >60 mL/min (ref >60)
GFR calc non Af Amer: >60 mL/min (ref >60)
GLUCOSE: 92 mg/dL (ref 65–99)
Potassium: 3.7 mmol/L (ref 3.5–5.1)
Sodium: 140 mmol/L (ref 135–145)
TOTAL PROTEIN: 6.4 g/dL — AB (ref 6.5–8.1)

## 2015-09-05 LAB — URINALYSIS, ROUTINE W REFLEX MICROSCOPIC
Bilirubin Urine: NEGATIVE
GLUCOSE, UA: NEGATIVE mg/dL
HGB URINE DIPSTICK: NEGATIVE
Ketones, ur: NEGATIVE mg/dL
Leukocytes, UA: NEGATIVE
Nitrite: NEGATIVE
PROTEIN: NEGATIVE mg/dL
Specific Gravity, Urine: 1.016 (ref 1.005–1.030)
pH: 5.5 (ref 5.0–8.0)

## 2015-09-05 MED ORDER — DIAZEPAM 5 MG PO TABS: 5.0000 mg | ORAL_TABLET | Freq: Two times a day (BID) | ORAL | 0 refills | Status: DC

## 2015-09-05 MED ORDER — HYDROCODONE-ACETAMINOPHEN 5-325 MG PO TABS: 1.0000 | ORAL_TABLET | ORAL | 0 refills | Status: DC | PRN

## 2015-09-05 MED ORDER — HYDROMORPHONE HCL 1 MG/ML IJ SOLN
0.5000 mg | Freq: Once | INTRAMUSCULAR | Status: AC
  Administered 2015-09-05: 0.5 mg via INTRAVENOUS
  Filled 2015-09-05: qty 1

## 2015-09-05 MED ORDER — ONDANSETRON HCL 4 MG/2ML IJ SOLN
4.0000 mg | Freq: Once | INTRAMUSCULAR | Status: AC
  Administered 2015-09-05: 4 mg via INTRAVENOUS
  Filled 2015-09-05: qty 2

## 2015-09-05 NOTE — ED Provider Notes (Signed)
[facility] DEPT Provider Note   CSN: [id number] Arrival date & time: 09/05/15  [id number]  First Provider Contact:  None       History   Chief Complaint Chief Complaint  Patient presents with  . Flank Pain  . Abdominal Pain    HPI [name] is a 65 y.o. female.  Patient presents with complaint of sudden onset of right upper abdominal and right side pain that she describes as severe and sharp. No fever, or vomiting though she has had nausea. No history of kidney stones. She reports having a cholecystectomy in the past. She also reports being seen in the ED several weeks ago for back pain that radiates to the right hip and groin, but reports the pain she had today is different than any previous pain she has had. No chest pain or SOB.    The history is provided by the patient. No language interpreter was used.  Flank Pain  Associated symptoms include abdominal pain. Pertinent negatives include no chest pain and no shortness of breath.  Abdominal Pain   Associated symptoms include nausea and frequency. Pertinent negatives include fever, diarrhea, vomiting, constipation and myalgias.    Past Medical History:  Diagnosis Date  . Asthma   . Fibromyalgia   . Hypertension   . Migraine   . Thyroid disease     There are no active problems to display for this patient.   Past Surgical History:  Procedure Laterality Date  . ABDOMINAL HYSTERECTOMY    . BREAST SURGERY     Cystectomy on Left breast  . CHOLECYSTECTOMY    . HERNIA REPAIR     abdomen    OB History    No data available       Home Medications    Prior to Admission medications   Not on File    Family History Family History  Problem Relation Age of Onset  . Breast cancer Mother   . Heart failure Father   . Diabetes Father   . Arthritis Father   . Hypertension Father   . Colon cancer Sister   . Sarcoidosis Sister   . Sarcoidosis Brother   . Multiple sclerosis Cousin     Social History Social  History  Substance Use Topics  . Smoking status: Never Smoker  . Smokeless tobacco: Never Used  . Alcohol use No     Allergies   Codeine; Lyrica [pregabalin]; Statins; Ultram [tramadol hcl]; and Celebrex [celecoxib]   Review of Systems Review of Systems  Constitutional: Negative for chills and fever.  Respiratory: Negative.  Negative for shortness of breath.   Cardiovascular: Negative.  Negative for chest pain.  Gastrointestinal: Positive for abdominal pain and nausea. Negative for constipation, diarrhea and vomiting.  Genitourinary: Positive for flank pain and frequency.  Musculoskeletal: Negative for myalgias.  Skin: Negative.  Negative for rash.  Neurological: Negative.      Physical Exam Updated Vital Signs BP 126/67   Pulse 78   Temp 98.8 F (37.1 C) (Oral)   Resp 19   SpO2 92%   Physical Exam  Constitutional: She appears well-developed and well-nourished.  HENT:  Head: Normocephalic.  Neck: Normal range of motion. Neck supple.  Cardiovascular: Normal rate and regular rhythm.   Pulmonary/Chest: Effort normal and breath sounds normal. She has no wheezes. She has no rales.  Abdominal: Soft. Bowel sounds are normal. There is tenderness. There is no rebound and no guarding.    Musculoskeletal: Normal range of motion.  Neurological:  She is alert. No cranial nerve deficit.  Skin: Skin is warm and dry. No rash noted.  Psychiatric: She has a normal mood and affect.     ED Treatments / Results  Labs (all labs ordered are listed, but only abnormal results are displayed) Labs Reviewed  COMPREHENSIVE METABOLIC PANEL - Abnormal; Notable for the following:       Result Value   Chloride 112 (*)    Calcium 8.2 (*)    Total Protein 6.4 (*)    Albumin 3.0 (*)    ALT 13 (*)    Total Bilirubin 0.2 (*)    All other components within normal limits  URINE CULTURE  CBC WITH DIFFERENTIAL/PLATELET  URINALYSIS, ROUTINE W REFLEX MICROSCOPIC (NOT AT [facility])    EKG  EKG  Interpretation None       Radiology Ct Renal Stone Study  Result Date: 09/05/2015 CLINICAL DATA:  Acute onset of severe right flank pain, radiating to the abdomen. Initial encounter. EXAM: CT ABDOMEN AND PELVIS WITHOUT CONTRAST TECHNIQUE: Multidetector CT imaging of the abdomen and pelvis was performed following the standard protocol without IV contrast. COMPARISON:  None. FINDINGS: The visualized lung bases are clear. The liver and spleen are unremarkable in appearance. The patient is status post cholecystectomy, with clips noted along the gallbladder fossa. The pancreas and adrenal glands are unremarkable. The kidneys are unremarkable in appearance. There is no evidence of hydronephrosis. No renal or ureteral stones are seen. No perinephric stranding is appreciated. No free fluid is identified. The small bowel is unremarkable in appearance. The stomach is within normal limits. No acute vascular abnormalities are seen. The appendix is normal in caliber, without evidence of appendicitis. Scattered diverticulosis is noted along the sigmoid colon, without evidence of diverticulitis. The bladder is mildly distended and grossly unremarkable. The patient is status post hysterectomy. A ring is noted at the vagina. No suspicious adnexal masses are seen. No inguinal lymphadenopathy is seen. No acute osseous abnormalities are identified. Vacuum phenomenon is noted at L5-S1. IMPRESSION: 1. No acute abnormality seen within the abdomen or pelvis. 2. Scattered diverticulosis along the sigmoid colon, without evidence of diverticulitis. Electronically Signed   By: Garald Balding M.D.   On: 09/05/2015 04:41    Procedures Procedures (including critical care time)  Medications Ordered in ED Medications  ondansetron (ZOFRAN) injection 4 mg (4 mg Intravenous Given 09/05/15 0505)  HYDROmorphone (DILAUDID) injection 0.5 mg (0.5 mg Intravenous Given 09/05/15 0506)     Initial Impression / Assessment and Plan / ED  Course  I have reviewed the triage vital signs and the nursing notes.  Pertinent labs & imaging results that were available during my care of the patient were reviewed by me and considered in my medical decision making (see chart for details).  Clinical Course    Patient with pain in the right abdomen and side noted to be new pain today. Per review of old chart, she has been seen for similar pain in the very recent past. Labs and imaging unremarkable. VSS. She is evaluated by Dr. Eulis Foster and felt stable for discharge home.   Final Clinical Impressions(s) / ED Diagnoses   Final diagnoses:  None  1. Abdominal pain  New Prescriptions New Prescriptions   No medications on file     Charlann Lange, Hershal Coria 09/05/15 0543    Daleen Bo, MD 09/05/15 [id number]    Daleen Bo, MD 09/07/15 1100

## 2015-09-05 NOTE — ED Provider Notes (Signed)
  Face-to-face evaluation   History: She has recurrent right flank pain, previously treated with prednisone, and evaluated in the ED and by her PCP.  Physical exam: Obese, alert, cooperative. She has pain right flank with movement. She has tenderness right lumbar and right abdomen.  Medical screening examination/treatment/procedure(s) were conducted as a shared visit with non-physician practitioner(s) and myself.  I personally evaluated the patient during the encounter   Daleen Bo, MD 09/07/15 1100

## 2015-09-05 NOTE — ED Triage Notes (Signed)
Brought in by EMS from home, patient complain of severe right flank pain radiating to her abdomen. Patient stated she took 1 pill of hydrocodone at home and did not help, per EMS they gave her 4 mg Morphine IV. Patient still complain of 8/10 of pain as of now.

## 2015-09-05 NOTE — ED Triage Notes (Signed)
Brought in by EMS from home, patient complain of severe right flank pain radiating to her abdomen. Patient stated she took 1 pill of hydrocodone at home and did not help, per EMS they gave her 4 mg Morphine IV. Patient still complain of 8/10 of pain as of now

## 2015-09-05 NOTE — ED Notes (Signed)
Bed: WA24 Expected date:  Expected time:  Means of arrival:  Comments: 65 yr old flank pain, urination pain

## 2015-09-06 ENCOUNTER — Encounter (HOSPITAL_COMMUNITY): Payer: Self-pay | Admitting: Emergency Medicine

## 2015-09-06 LAB — URINE CULTURE

## 2015-12-15 ENCOUNTER — Telehealth: Payer: Self-pay

## 2015-12-15 NOTE — Telephone Encounter (Signed)
Her last bone density was 2015 which was normal. I would doubt that she would have any significant bone loss in the interim that would explain her symptoms. Usually bone loss is asymptomatic. It sounds like she is having more of an orthopedic issue and I would recommend following up with them first.

## 2015-12-15 NOTE — Telephone Encounter (Signed)
Patient called stating she would like to see if you would order Bone Density test. She said she is having problems in her back. Her "spine is weak" and she is leaning forward.  "Something is wrong.".  She said she has appt with neurologist on 12/6.  She said several years back you had her go to hospital for IV (Reclast) because her bones were weak.    Her 04/2013 Bone Density you indicated she would repeat it in 5 years.  Please advise.

## 2015-12-15 NOTE — Telephone Encounter (Signed)
Patient informed. She has an orthopedist and said she will call him.

## 2015-12-29 ENCOUNTER — Ambulatory Visit (INDEPENDENT_AMBULATORY_CARE_PROVIDER_SITE_OTHER): Payer: Medicare Other | Admitting: Neurology

## 2015-12-29 ENCOUNTER — Encounter: Payer: Self-pay | Admitting: Neurology

## 2015-12-29 VITALS — BP 122/88 | HR 94 | Ht 64.0 in | Wt 228.0 lb

## 2015-12-29 DIAGNOSIS — M79604 Pain in right leg: Secondary | ICD-10-CM

## 2015-12-29 DIAGNOSIS — G43009 Migraine without aura, not intractable, without status migrainosus: Secondary | ICD-10-CM | POA: Diagnosis not present

## 2015-12-29 DIAGNOSIS — M549 Dorsalgia, unspecified: Secondary | ICD-10-CM | POA: Diagnosis not present

## 2015-12-29 MED ORDER — RIZATRIPTAN BENZOATE 5 MG PO TBDP: 5.0000 mg | ORAL_TABLET | ORAL | 6 refills | Status: DC | PRN

## 2015-12-29 MED ORDER — NORTRIPTYLINE HCL 25 MG PO CAPS: 50.0000 mg | ORAL_CAPSULE | Freq: Every day | ORAL | 11 refills | Status: DC

## 2015-12-29 NOTE — Progress Notes (Signed)
PATIENT: [name] DOB: Apr 03, 1950  Chief Complaint  Patient presents with  . Chronic Low Back Pain    Reports onset of low back pain approximately nine months ago.  Radiates into her right hip, right leg and groin.  The pain in increased by walking.  Marland Kitchen Referring Provider    Greta O'Buch, PA-C (Pain Management)  . PCP    Biagio Borg, MD     HISTORICAL  [name] is a 65 years old right-handed female, seen in refer by his primary care doctor Biagio Borg for evaluation of chronic low back pain, initial evaluation was on December 29 2015.  She had a past medical history of diverticulitis, fibromyalgia, endometriosis, acid reflux disease, chronic migraine, iron deficiency anemia, vitamin D deficiency, hyperlipidemia, hypertension, hypothyroidism, osteopenia, history of shingle involving right flank in December 2015  She fell in January 2017, landed on her right side, suffered severe left foot injury, has to walk on the crutches for few weeks, few months later, she noticed back pain, radiating to her right hip, gradually getting worse, by August 2017, she had a constant nagging pain at her right hip, with walking it would be exacerbated to a much severe radiating pain from right lower back to her right hip, wrapping around right groin area, she presented to emergency room few times, CT of the kidney stone protocol was negative, x-ray of pelvic hip showed no significant abnormality,  She denies bowel and bladder incontinence, gait abnormality due to right hip right low back pain, no persistent right lower extremity paresthesia or weakness,  She also complains of chronic migraine headaches for 30 years, 3-4 times each week, lasting for a few hours, she is only taking naproxen or ibuprofen as needed for abortive treatment. Previously she has been treated with preventive medications Zonegran, Topamax, multiple trigger point injection by [facility] with limited  response, she is now taking Topamax 50 mg twice a day, with some help  REVIEW OF SYSTEMS: Full 14 system review of systems performed and notable only for as above  ALLERGIES: Allergies  Allergen Reactions  . Celecoxib Nausea Only and Nausea And Vomiting  . Codeine Nausea Only    REACTION: Nausea  . Codeine Nausea And Vomiting  . Duloxetine Nausea And Vomiting    REACTION: n/v REACTION: n/v  . Erythromycin     REACTION: Itching  . Escitalopram Oxalate Nausea Only  . Ezetimibe Nausea And Vomiting  . Gluten Meal     Intolerant to gluten  . Lactose Intolerance (Gi)   . Lyrica [Pregabalin] Nausea And Vomiting  . Milnacipran Nausea And Vomiting    REACTION: n/v REACTION: n/v  . Pregabalin Itching and Nausea And Vomiting    REACTION: wt gain REACTION: wt gain  . Rofecoxib Nausea And Vomiting  . Statins Nausea And Vomiting    Body aches  . Statins     Body aches  . Tramadol Nausea Only  . Ultram [Tramadol Hcl] Nausea And Vomiting  . Celebrex [Celecoxib] Swelling and Rash  . Contrast Media [Iodinated Diagnostic Agents] Hives    Patient has history of hives from Ionic contrast injection in 1980's. No allergy to Non-Ionic contrast. 05-09-2014 rsm    HOME MEDICATIONS: Current Outpatient Prescriptions  Medication Sig Dispense Refill  . albuterol (PROVENTIL HFA;VENTOLIN HFA) 108 (90 BASE) MCG/ACT inhaler Inhale 2 puffs into the lungs every 6 (six) hours as needed for wheezing. For wheezing 3.7 g 11  . aspirin EC  81 MG EC tablet Take 1 tablet (81 mg total) by mouth daily. 30 tablet 0  . Calcium Carbonate-Vitamin D (CALCIUM + D PO) Take 2 tablets by mouth at bedtime.     . cycloSPORINE (RESTASIS) 0.05 % ophthalmic emulsion Place 1 drop into both eyes 2 (two) times daily.    Marland Kitchen dicyclomine (BENTYL) 20 MG tablet Take 1 tablet (20 mg total) by mouth 2 (two) times daily. 60 tablet 1  . Estradiol Acetate (FEMRING) 0.05 MG/24HR RING Place 1 each vaginally every 3 (three) months. 1 each 4  .  fentaNYL (DURAGESIC - DOSED MCG/HR) 25 MCG/HR Place 1 patch onto the skin every 3 (three) days.    . fluticasone (FLONASE) 50 MCG/ACT nasal spray Place 2 sprays into both nostrils as needed. (Patient taking differently: Place 2 sprays into both nostrils as needed for allergies or rhinitis. ) 16 g 11  . hydrochlorothiazide (HYDRODIURIL) 25 MG tablet Take 1 tablet (25 mg total) by mouth daily as needed (for edema). 90 tablet 3  . ibuprofen (ADVIL,MOTRIN) 800 MG tablet Take 1 tablet (800 mg total) by mouth every 8 (eight) hours as needed. 60 tablet 3  . lansoprazole (PREVACID) 30 MG capsule Take 1 capsule (30 mg total) by mouth 2 (two) times daily before a meal. 60 capsule 11  . lidocaine (LIDODERM) 5 % Place 1 patch onto the skin as needed (pain). 30 patch 0  . linaclotide (LINZESS) 72 MCG capsule Take 72 mcg by mouth daily before breakfast.    . naproxen (NAPROSYN) 500 MG tablet Take 1 tablet (500 mg total) by mouth daily as needed (migraines). 30 tablet 0  . NONFORMULARY OR COMPOUNDED ITEM Boric Acid suppositories 600 mg 30 each 2  . ondansetron (ZOFRAN ODT) 4 MG disintegrating tablet '4mg'$  ODT q4 hours prn nausea/vomit 30 tablet 0  . Polyethyl Glycol-Propyl Glycol (SYSTANE) 0.4-0.3 % GEL ophthalmic gel Place 1 application into both eyes daily. 10 mL 12  . polyethylene glycol powder (MIRALAX) powder Take 17 g by mouth as needed. For constipation 255 g 0  . thyroid (ARMOUR) 30 MG tablet Take 1 tablet (30 mg total) by mouth daily before breakfast. 90 tablet 3  . topiramate (TOPAMAX) 50 MG tablet Take 1 tablet (50 mg total) by mouth 2 (two) times daily. 60 tablet 11   No current facility-administered medications for this visit.     PAST MEDICAL HISTORY: Past Medical History:  Diagnosis Date  . Anxiety   . Arthritis   . Asthma   . Asthma   . Biliary dyskinesia   . Bowel obstruction   . BRCA negative 02/2013  . Cervical dysplasia   . Complication of anesthesia    "I have the shakes"  .  Depression   . Diverticulosis   . Elevated cholesterol   . Endometriosis   . Fibromyalgia   . GERD (gastroesophageal reflux disease)   . Heart murmur    mvp  . Hx of oral aphthous ulcers   . Hypertension   . Hypertension    no med  . Hypothyroidism    hx nodule on thyroid   . Impaired glucose tolerance 02/25/2011  . Low back pain   . Migraine   . Migraines   . Nocturia   . Polycythemia vera ([place])   . Shingles   . Shortness of breath    with exertion  . Sickle cell trait ([place])   . Sleep apnea    has not used c-pap x 1 yr.  use mouthguard now  . Thyroid disease   . Thyroid disease    Hypothyroid    PAST SURGICAL HISTORY: Past Surgical History:  Procedure Laterality Date  . ABDOMINAL HYSTERECTOMY    . BREAST SURGERY     Cystectomy on Left breast  . BREAST SURGERY     Benign breast cyst  . CHOLECYSTECTOMY    . CHOLECYSTECTOMY  12/05/2011   Procedure: LAPAROSCOPIC CHOLECYSTECTOMY;  Surgeon: Ralene Ok, MD;  Location: [facility];  Service: General;  Laterality: N/A;  . COLPOSCOPY    . GYNECOLOGIC CRYOSURGERY    . HERNIA REPAIR     abdomen  . OOPHORECTOMY     BSO  . PANNICULECTOMY N/A 06/12/2014   Procedure: PANNICULECTOMY;  Surgeon: Irene Limbo, MD;  Location: [facility];  Service: Plastics;  Laterality: N/A;  . PELVIC LAPAROSCOPY  1995,2002   DL LSO DL RSO  . TONSILLECTOMY    . VAGINAL HYSTERECTOMY  1983    FAMILY HISTORY: Family History  Problem Relation Age of Onset  . Hypertension Mother   . Heart disease Mother   . Breast cancer Mother     Age 45  . Bone cancer Mother   . Hypertension Father   . Heart disease Father   . Diabetes Father   . Colon cancer Sister 23  . Sarcoidosis Sister   . Breast cancer Sister 66  . Breast cancer Maternal Grandmother     Age 28  . Ovarian cancer Paternal Grandmother   . Breast cancer Paternal Grandmother     Age unknown  . Sarcoidosis Brother   . Breast cancer Maternal Aunt     Age 46's  .  Breast cancer Maternal Aunt     Age 62  . Heart failure Father   . Arthritis Father   . Colon cancer Sister   . Sarcoidosis Sister   . Multiple sclerosis Cousin     SOCIAL HISTORY:  Social History   Social History  . Marital status: Single    Spouse name: N/A  . Number of children: 2  . Years of education: 2.5 years of college   Occupational History  . retired Retired   Social History Main Topics  . Smoking status: Never Smoker  . Smokeless tobacco: Never Used  . Alcohol use 0.0 oz/week     Comment: socially  . Drug use: No  . Sexual activity: Not Currently    Birth control/ protection: Surgical     Comment: HYST-1st intercourse 17 yo-5 partners   Other Topics Concern  . Not on file   Social History Narrative   Lives at home alone.   Right-handed.   6 cups per week.     PHYSICAL EXAM   Vitals:   12/29/15 1025  BP: 122/88  Pulse: 94  Weight: 228 lb (103.4 kg)  Height: '5\' 4"'$  (1.626 m)    Not recorded      Body mass index is 39.14 kg/m.  PHYSICAL EXAMNIATION:  Gen: NAD, conversant, well nourised, obese, well groomed                     Cardiovascular: Regular rate rhythm, no peripheral edema, warm, nontender. Eyes: Conjunctivae clear without exudates or hemorrhage Neck: Supple, no carotid bruits. Pulmonary: Clear to auscultation bilaterally   NEUROLOGICAL EXAM:  MENTAL STATUS: Speech:    Speech is normal; fluent and spontaneous with normal comprehension.  Cognition:     Orientation to time, place and person  Normal recent and remote memory     Normal Attention span and concentration     Normal Language, naming, repeating,spontaneous speech     Fund of knowledge   CRANIAL NERVES: CN II: Visual fields are full to confrontation. Fundoscopic exam is normal with sharp discs and no vascular changes. Pupils are round equal and briskly reactive to light. CN III, IV, VI: extraocular movement are normal. No ptosis. CN V: Facial sensation is  intact to pinprick in all 3 divisions bilaterally. Corneal responses are intact.  CN VII: Face is symmetric with normal eye closure and smile. CN VIII: Hearing is normal to rubbing fingers CN IX, X: Palate elevates symmetrically. Phonation is normal. CN XI: Head turning and shoulder shrug are intact CN XII: Tongue is midline with normal movements and no atrophy. First still look like him MOTOR: There is no pronator drift of out-stretched arms. Muscle bulk and tone are normal. Muscle strength is normal.  REFLEXES: Reflexes are 2+ and symmetric at the biceps, triceps, knees, and ankles. Plantar responses are flexor.  SENSORY: Intact to light touch, pinprick, positional sensation and vibratory sensation are intact in fingers and toes.  COORDINATION: Rapid alternating movements and fine finger movements are intact. There is no dysmetria on finger-to-nose and heel-knee-shin.    GAIT/STANCE: Antalgic gait, cautious  Romberg is absent.   DIAGNOSTIC DATA (LABS, IMAGING, TESTING) - I reviewed patient records, labs, notes, testing and imaging myself where available.   ASSESSMENT AND PLAN  [name] is a 65 y.o. female   Chronic low back pain, right hip pain,  Need to rule out lumbar radiculopathy  MRI lumbar   Chronic migraine  Nortriptyline 25 mg titrating to 50 mg every night as preventative medications  Maxalt 5 mg as needed    Marcial Pacas, M.D. Ph.D.  [facility] [address], [place], [place] [address] Ph: [phone] Fax: [phone]  CC: Biagio Borg, MD

## 2016-01-12 ENCOUNTER — Ambulatory Visit
Admission: RE | Admit: 2016-01-12 | Discharge: 2016-01-12 | Disposition: A | Discharge status: Home / Self Care | Payer: Medicare Other | Source: Ambulatory Visit | Attending: Neurology | Admitting: Neurology

## 2016-01-12 DIAGNOSIS — M549 Dorsalgia, unspecified: Secondary | ICD-10-CM | POA: Diagnosis not present

## 2016-01-12 DIAGNOSIS — G43009 Migraine without aura, not intractable, without status migrainosus: Secondary | ICD-10-CM

## 2016-01-12 DIAGNOSIS — M79604 Pain in right leg: Secondary | ICD-10-CM

## 2016-01-13 ENCOUNTER — Telehealth: Payer: Self-pay | Admitting: *Deleted

## 2016-01-13 NOTE — Telephone Encounter (Signed)
Per Dr Krista Blue, spoke with patient and informed her that MRI lumbar spine showed mild degenerative disease, no significant canal or foraminal stenosis or narrowing. Patient stated she is "no better, still have excruciating pain in her back". She stated that the nortriptyline Dr Krista Blue prescribed makes her very nauseated. She cut the dose back to 25 mg but stated it still makes her nauseated. She would like to know "where do we go from here? I want to find out the root of my problem." She stated that Ibuprofen, Naproxen do not help with her back pain. She stated the Duragesic patch is for her fibromyalgia, and if it flares and the dr increases the dose it causes nausea. So she stated she tries to avoid increasing the dose of her patch. She would like to know "what is the next step and is there another medication instead of nortriptyline. Advised her Dr Krista Blue is in the office tomorrow doing procedures; note will be routed to her. Informed her that this office is closed [place], Tues for the holiday. Patient verbalized understanding, appreciation.

## 2016-01-13 NOTE — Telephone Encounter (Signed)
Spoke to patient - states she is still having significant pain without relief from current medications.  She is wondering if there is any further testing or treatment to offer.  She is aware of our holiday schedule.

## 2016-01-14 MED ORDER — MELOXICAM 15 MG PO TABS: 15.0000 mg | ORAL_TABLET | Freq: Every day | ORAL | 6 refills | Status: DC

## 2016-01-14 NOTE — Addendum Note (Signed)
Addended by: Marcial Pacas on: 01/14/2016 08:57 AM   Modules accepted: Orders

## 2016-01-14 NOTE — Telephone Encounter (Signed)
MRI of lumbar showed no significant abnormality,I called in mobic 15mg  prn for her low back pain.

## 2016-01-19 ENCOUNTER — Other Ambulatory Visit: Payer: Self-pay | Admitting: *Deleted

## 2016-01-19 DIAGNOSIS — M25551 Pain in right hip: Secondary | ICD-10-CM

## 2016-01-19 DIAGNOSIS — M545 Low back pain: Secondary | ICD-10-CM

## 2016-01-19 NOTE — Telephone Encounter (Signed)
Spoke to patient - meloxicam is mildly helpful.  Per Dr. Krista Blue, can proceed with a NCV/EMG, if the patient wishes.  Pt would like to have test and orders have been placed in the chart.  Pt is aware to expect a call for scheduling.

## 2016-01-19 NOTE — Telephone Encounter (Signed)
Left message for a return call

## 2016-01-28 ENCOUNTER — Telehealth: Payer: Self-pay | Admitting: Internal Medicine

## 2016-01-28 MED ORDER — BENZONATATE 100 MG PO CAPS: ORAL_CAPSULE | ORAL | 0 refills | Status: DC

## 2016-01-28 NOTE — Telephone Encounter (Signed)
Unfortunately we do not normally do this with controlled substance type cough medicine.  I can send  Tessalon perle for cough instead. thanks

## 2016-01-28 NOTE — Telephone Encounter (Signed)
Patient states she has cough and is requesting Dr. Jenny Reichmann to send a script for cough syrup to her pharmacy.  Patient uses [facility] on [address].

## 2016-02-03 ENCOUNTER — Encounter: Payer: Self-pay | Admitting: Gastroenterology

## 2016-02-22 ENCOUNTER — Ambulatory Visit: Payer: Medicare Other | Admitting: Nurse Practitioner

## 2016-02-22 ENCOUNTER — Ambulatory Visit: Payer: Medicare Other | Admitting: Neurology

## 2016-02-22 NOTE — Telephone Encounter (Addendum)
I called and spoke to pt yesterday 1700.  She had not had her [place]/EMG done yet (scheduled 03-17-16).  Had MRI and she had been notified of her results.  I relayed to pt that no need for appt until after her [place]/EMG done.  She stated that she had worsening pain in the R ball of her hip down to R groin/vagina area.  A constant toothache pain.  Her activity has decreased due to this pain.  Meloxicam not helping as much.  She does warm soaks that do help some.  She wonder if her labs relating to her RBC's are elevated as before could be causing issues.  She has been followed by Dr. Crist Fat in hematology in the recent past. I told her that she may want to contact them or her endocrinologist to see about this.  She needs f/u with Dr. Krista Blue.  I would forward message to EMG tech if cancellation to give her a call (to get in sooner).

## 2016-03-17 ENCOUNTER — Other Ambulatory Visit: Payer: Self-pay | Admitting: Neurology

## 2016-03-17 ENCOUNTER — Ambulatory Visit (INDEPENDENT_AMBULATORY_CARE_PROVIDER_SITE_OTHER): Payer: Self-pay

## 2016-03-17 ENCOUNTER — Ambulatory Visit (INDEPENDENT_AMBULATORY_CARE_PROVIDER_SITE_OTHER): Payer: Medicare Other | Admitting: Neurology

## 2016-03-17 ENCOUNTER — Ambulatory Visit
Admission: RE | Admit: 2016-03-17 | Discharge: 2016-03-17 | Disposition: A | Discharge status: Home / Self Care | Payer: Medicare Other | Source: Ambulatory Visit | Attending: Neurology | Admitting: Neurology

## 2016-03-17 DIAGNOSIS — G43009 Migraine without aura, not intractable, without status migrainosus: Secondary | ICD-10-CM

## 2016-03-17 DIAGNOSIS — M25551 Pain in right hip: Secondary | ICD-10-CM

## 2016-03-17 DIAGNOSIS — Z0289 Encounter for other administrative examinations: Secondary | ICD-10-CM

## 2016-03-17 DIAGNOSIS — M545 Low back pain: Secondary | ICD-10-CM

## 2016-03-17 DIAGNOSIS — G8929 Other chronic pain: Secondary | ICD-10-CM | POA: Diagnosis not present

## 2016-03-17 DIAGNOSIS — M25511 Pain in right shoulder: Secondary | ICD-10-CM

## 2016-03-17 NOTE — Procedures (Signed)
Full Name: [name] Gender: Female MRN #: [id number] Date of Birth: 01-Feb-1950    Visit Date: 03/17/2016 10:49 Age: 66 Years 5 Months Old Examining Physician: Marcial Pacas, MD  Referring Physician: Krista Blue History: 66 year old right-handed female complains of right shoulder, chronic right low back pain, right hip pain, gait abnormality  Summary of Test:  Nerve conduction study:  Right sural, superficial peroneal, median, ulnar sensory responses were normal. Right median, ulnar, superficial peroneal, tibial motor responses were normal. Bilateral tibial H reflexes were present and symmetric  Electromyography: Selected needle examination of right upper extremity, right cervical paraspinal muscles; right lower extremity, right lumbar sacral paraspinal muscles was normal.  Conclusion: This is a normal study, there is no electrodiagnostic evidence of large fiber peripheral neuropathy, right cervical radiculopathy or right lumbosacral radiculopathy.    ------------------------------- Marcial Pacas, M.D.  [facility] [place], [place] [address] Tel: [phone] Fax: [phone]        [facility]    Nerve / Sites Rec. Site Peak Lat Ref. Amp.1-2 Ref. Distance    ms ms V V cm  R Sural - Ankle (Calf)     Calf Ankle 3.28 ?4.40 9.5 ?6.0 14  R Superficial peroneal - Ankle     Lat leg Ankle 4.11 ?4.40 6.0 ?6.0 14  R Median - Orthodromic (Dig II, Mid palm)     Dig II Wrist 3.33 ?3.40 9.5 ?10.0 13  R Ulnar - Orthodromic, (Dig V, Mid palm)     Dig V Wrist 2.92 ?3.10 7.6 ?5.0 11     [facility]    Nerve / Sites Muscle Latency Ref. Amplitude Ref. Rel Amp Segments Distance Lat Diff Velocity Ref. Area    ms ms mV mV %  cm ms m/s m/s mVms  R Median - APB     Wrist APB 3.5 ?4.4 6.0 ?4.0 100 Wrist - APB 7    25.9     Upper arm APB 7.1  5.5  90.7 Upper arm - Wrist 18 3.5 51  23.9  R Ulnar - ADM     Wrist ADM 2.9 ?3.3 9.7 ?6.0 100 Wrist - ADM 7    29.9     B.Elbow  ADM 6.8  8.8  91.3 B.Elbow - Wrist 20 3.9 51 ?49 29.9     A.Elbow ADM 9.2  8.6  97.3 A.Elbow - B.Elbow 12 2.4 50 ?49 29.2     Axilla ADM 11.6  7.3  84.9 Axilla - A.Elbow 15 2.4 61  25.3     Supraclav fossa ADM 13.7  7.7  105 Supraclav fossa - Axilla 14 2.1 67  34.0         A.Elbow - Wrist  6.3     R Peroneal - EDB     Ankle EDB 4.4 ?6.5 2.8 ?2.0 100 Ankle - EDB 9    7.9     Fib head EDB 11.1  2.6  90.5 Fib head - Ankle 30 6.7 45 ?44 7.5     Pop fossa EDB 13.2  2.6  103 Pop fossa - Fib head 9 2.0 44 ?44 7.2         Pop fossa - Ankle  8.8     R Tibial - AH     Ankle AH 4.4 ?5.8 7.0 ?4.0 100 Ankle - AH 9    14.4     Pop fossa AH 13.5  2.4  34.8 Pop fossa - Ankle  38 9.1 42 ?41 8.3     F  Wave    Nerve F Lat Ref.   ms ms  R Tibial - AH 51.2 ?56.0  R Ulnar - ADM 28.4 ?32.0     H Reflex    Nerve H Lat Lat Hmax   ms ms   Left Right Ref. Left Right Ref.  Tibial - [place] 35.1 35.1 ?35.0 39.0 35.1 ?35.0     EMG full       EMG Summary Table    Spontaneous MUAP Recruitment  Muscle IA Fib PSW Fasc Other Amp Dur. Poly Pattern  R. Lumbar paraspinals (mid) Normal None None None _______ Normal Normal Normal Normal  R. Lumbar paraspinals (low) Normal None None None _______ Normal Normal Normal Normal  R. Pronator teres Normal None None None _______ Normal Normal Normal Normal  R. Extensor digitorum communis Normal None None None _______ Normal Normal Normal Normal  R. Biceps brachii Normal None None None _______ Normal Normal Normal Normal  R. Deltoid Normal None None None _______ Normal Normal Normal Normal  R. Triceps brachii Normal None None None _______ Normal Normal Normal Normal  R. Cervical paraspinals Normal None None None _______ Normal Normal Normal Normal  R. Tibialis anterior Normal None None None _______ Normal Normal Normal Normal  R. Gastrocnemius (Medial head) Normal None None None _______ Normal Normal Normal Normal  R. Vastus lateralis Normal None None None _______ Normal  Normal Normal Normal

## 2016-03-17 NOTE — Progress Notes (Signed)
PATIENT: [name] DOB: 1950-10-20  No chief complaint on file.    HISTORICAL  [name] is a 66 years old right-handed female, seen in refer by his primary care doctor Biagio Borg for evaluation of chronic low back pain, initial evaluation was on December 29 2015.  She had a past medical history of diverticulitis, fibromyalgia, endometriosis, acid reflux disease, chronic migraine, iron deficiency anemia, vitamin D deficiency, hyperlipidemia, hypertension, hypothyroidism, osteopenia, history of shingle involving right flank in December 2015  She fell in January 2017, landed on her right side, suffered severe left foot injury, has to walk on the crutches for few weeks, few months later, she noticed back pain, radiating to her right hip, gradually getting worse, by August 2017, she had a constant nagging pain at her right hip, with walking it would be exacerbated to a much severe radiating pain from right lower back to her right hip, wrapping around right groin area, she presented to emergency room few times, CT of the kidney stone protocol was negative, x-ray of pelvic hip showed no significant abnormality,  She denies bowel and bladder incontinence, gait abnormality due to right hip right low back pain, no persistent right lower extremity paresthesia or weakness,  She also complains of chronic migraine headaches for 30 years, 3-4 times each week, lasting for a few hours, she is only taking naproxen or ibuprofen as needed for abortive treatment. Previously she has been treated with preventive medications Zonegran, Topamax, multiple trigger point injection by [facility] with limited response, she is now taking Topamax 50 mg twice a day, with some help  Update March 17 2016: She continue complains of severe right hip pain, radiating pain to right lower extremity, gait abnormality We have personally reviewed MRI of lumbar in December 2017, no acute abnormality, no  significant foraminal canal stenosis.  She also complains recent onset of severe right posterior shoulder pain, reported a history of right rotator cuff.  Her migraine headache overall has improved with Topamax, but she could not tolerate higher dose 50 mg twice a day, contributed her recent hair loss to Topamax dose, only taking nortriptyline 25 mg as needed, cause excessive drowsiness.   REVIEW OF SYSTEMS: Full 14 system review of systems performed and notable only for as above  ALLERGIES: Allergies  Allergen Reactions  . Celecoxib Nausea Only and Nausea And Vomiting  . Codeine Nausea Only    REACTION: Nausea  . Codeine Nausea And Vomiting  . Duloxetine Nausea And Vomiting    REACTION: n/v REACTION: n/v  . Erythromycin     REACTION: Itching  . Escitalopram Oxalate Nausea Only  . Ezetimibe Nausea And Vomiting  . Gluten Meal     Intolerant to gluten  . Lactose Intolerance (Gi)   . Lyrica [Pregabalin] Nausea And Vomiting  . Milnacipran Nausea And Vomiting    REACTION: n/v REACTION: n/v  . Pregabalin Itching and Nausea And Vomiting    REACTION: wt gain REACTION: wt gain  . Rofecoxib Nausea And Vomiting  . Statins Nausea And Vomiting    Body aches  . Statins     Body aches  . Tramadol Nausea Only  . Ultram [Tramadol Hcl] Nausea And Vomiting  . Celebrex [Celecoxib] Swelling and Rash  . Contrast Media [Iodinated Diagnostic Agents] Hives    Patient has history of hives from Ionic contrast injection in 1980's. No allergy to Non-Ionic contrast. 05-09-2014 rsm    HOME MEDICATIONS: Current Outpatient Prescriptions  Medication Sig Dispense Refill  . albuterol (PROVENTIL HFA;VENTOLIN HFA) 108 (90 BASE) MCG/ACT inhaler Inhale 2 puffs into the lungs every 6 (six) hours as needed for wheezing. For wheezing 3.7 g 11  . aspirin EC 81 MG EC tablet Take 1 tablet (81 mg total) by mouth daily. 30 tablet 0  . benzonatate (TESSALON PERLES) 100 MG capsule 1-2 tabs by mouth every 6 hrs as  needed for cough 60 capsule 0  . Calcium Carbonate-Vitamin D (CALCIUM + D PO) Take 2 tablets by mouth at bedtime.     . cycloSPORINE (RESTASIS) 0.05 % ophthalmic emulsion Place 1 drop into both eyes 2 (two) times daily.    Marland Kitchen dicyclomine (BENTYL) 20 MG tablet Take 1 tablet (20 mg total) by mouth 2 (two) times daily. 60 tablet 1  . Estradiol Acetate (FEMRING) 0.05 MG/24HR RING Place 1 each vaginally every 3 (three) months. 1 each 4  . fentaNYL (DURAGESIC - DOSED MCG/HR) 25 MCG/HR Place 1 patch onto the skin every 3 (three) days.    . fluticasone (FLONASE) 50 MCG/ACT nasal spray Place 2 sprays into both nostrils as needed. (Patient taking differently: Place 2 sprays into both nostrils as needed for allergies or rhinitis. ) 16 g 11  . hydrochlorothiazide (HYDRODIURIL) 25 MG tablet Take 1 tablet (25 mg total) by mouth daily as needed (for edema). 90 tablet 3  . ibuprofen (ADVIL,MOTRIN) 800 MG tablet Take 1 tablet (800 mg total) by mouth every 8 (eight) hours as needed. 60 tablet 3  . lansoprazole (PREVACID) 30 MG capsule Take 1 capsule (30 mg total) by mouth 2 (two) times daily before a meal. 60 capsule 11  . lidocaine (LIDODERM) 5 % Place 1 patch onto the skin as needed (pain). 30 patch 0  . linaclotide (LINZESS) 72 MCG capsule Take 72 mcg by mouth daily before breakfast.    . meloxicam (MOBIC) 15 MG tablet Take 1 tablet (15 mg total) by mouth daily. 30 tablet 6  . naproxen (NAPROSYN) 500 MG tablet Take 1 tablet (500 mg total) by mouth daily as needed (migraines). 30 tablet 0  . NONFORMULARY OR COMPOUNDED ITEM Boric Acid suppositories 600 mg 30 each 2  . nortriptyline (PAMELOR) 25 MG capsule Take 2 capsules (50 mg total) by mouth at bedtime. 60 capsule 11  . ondansetron (ZOFRAN ODT) 4 MG disintegrating tablet '4mg'$  ODT q4 hours prn nausea/vomit 30 tablet 0  . Polyethyl Glycol-Propyl Glycol (SYSTANE) 0.4-0.3 % GEL ophthalmic gel Place 1 application into both eyes daily. 10 mL 12  . polyethylene glycol  powder (MIRALAX) powder Take 17 g by mouth as needed. For constipation 255 g 0  . rizatriptan (MAXALT-MLT) 5 MG disintegrating tablet Take 1 tablet (5 mg total) by mouth as needed. May repeat in 2 hours if needed 15 tablet 6  . thyroid (ARMOUR) 30 MG tablet Take 1 tablet (30 mg total) by mouth daily before breakfast. 90 tablet 3  . topiramate (TOPAMAX) 50 MG tablet Take 1 tablet (50 mg total) by mouth 2 (two) times daily. 60 tablet 11   No current facility-administered medications for this visit.     PAST MEDICAL HISTORY: Past Medical History:  Diagnosis Date  . Anxiety   . Arthritis   . Asthma   . Asthma   . Biliary dyskinesia   . Bowel obstruction   . BRCA negative 02/2013  . Cervical dysplasia   . Complication of anesthesia    "I have the shakes"  . Depression   .  Diverticulosis   . Elevated cholesterol   . Endometriosis   . Fibromyalgia   . GERD (gastroesophageal reflux disease)   . Heart murmur    mvp  . Hx of oral aphthous ulcers   . Hypertension   . Hypertension    no med  . Hypothyroidism    hx nodule on thyroid   . Impaired glucose tolerance 02/25/2011  . Low back pain   . Migraine   . Migraines   . Nocturia   . Polycythemia vera ([place])   . Shingles   . Shortness of breath    with exertion  . Sickle cell trait ([place])   . Sleep apnea    has not used c-pap x 1 yr. use mouthguard now  . Thyroid disease   . Thyroid disease    Hypothyroid    PAST SURGICAL HISTORY: Past Surgical History:  Procedure Laterality Date  . ABDOMINAL HYSTERECTOMY    . BREAST SURGERY     Cystectomy on Left breast  . BREAST SURGERY     Benign breast cyst  . CHOLECYSTECTOMY    . CHOLECYSTECTOMY  12/05/2011   Procedure: LAPAROSCOPIC CHOLECYSTECTOMY;  Surgeon: Ralene Ok, MD;  Location: [facility];  Service: General;  Laterality: N/A;  . COLPOSCOPY    . GYNECOLOGIC CRYOSURGERY    . HERNIA REPAIR     abdomen  . OOPHORECTOMY     BSO  . PANNICULECTOMY N/A 06/12/2014   Procedure:  PANNICULECTOMY;  Surgeon: Irene Limbo, MD;  Location: [facility];  Service: Plastics;  Laterality: N/A;  . PELVIC LAPAROSCOPY  1995,2002   DL LSO DL RSO  . TONSILLECTOMY    . VAGINAL HYSTERECTOMY  1983    FAMILY HISTORY: Family History  Problem Relation Age of Onset  . Hypertension Mother   . Heart disease Mother   . Breast cancer Mother     Age 66  . Bone cancer Mother   . Hypertension Father   . Heart disease Father   . Diabetes Father   . Colon cancer Sister 57  . Sarcoidosis Sister   . Breast cancer Sister 101  . Breast cancer Maternal Grandmother     Age 63  . Ovarian cancer Paternal Grandmother   . Breast cancer Paternal Grandmother     Age unknown  . Sarcoidosis Brother   . Breast cancer Maternal Aunt     Age 5's  . Breast cancer Maternal Aunt     Age 44  . Heart failure Father   . Arthritis Father   . Colon cancer Sister   . Sarcoidosis Sister   . Multiple sclerosis Cousin     SOCIAL HISTORY:  Social History   Social History  . Marital status: Single    Spouse name: N/A  . Number of children: 2  . Years of education: 2.5 years of college   Occupational History  . retired Retired   Social History Main Topics  . Smoking status: Never Smoker  . Smokeless tobacco: Never Used  . Alcohol use 0.0 oz/week     Comment: socially  . Drug use: No  . Sexual activity: Not Currently    Birth control/ protection: Surgical     Comment: HYST-1st intercourse 17 yo-5 partners   Other Topics Concern  . Not on file   Social History Narrative   Lives at home alone.   Right-handed.   6 cups per week.     PHYSICAL EXAM   There were no vitals  filed for this visit.  Not recorded      There is no height or weight on file to calculate BMI.  PHYSICAL EXAMNIATION:  Gen: NAD, conversant, well nourised, obese, well groomed                     Cardiovascular: Regular rate rhythm, no peripheral edema, warm, nontender. Eyes: Conjunctivae  clear without exudates or hemorrhage Neck: Supple, no carotid bruits. Pulmonary: Clear to auscultation bilaterally   NEUROLOGICAL EXAM:  MENTAL STATUS: Speech:    Speech is normal; fluent and spontaneous with normal comprehension.  Cognition:     Orientation to time, place and person     Normal recent and remote memory     Normal Attention span and concentration     Normal Language, naming, repeating,spontaneous speech     Fund of knowledge   CRANIAL NERVES: CN II: Visual fields are full to confrontation. Fundoscopic exam is normal with sharp discs and no vascular changes. Pupils are round equal and briskly reactive to light. CN III, IV, VI: extraocular movement are normal. No ptosis. CN V: Facial sensation is intact to pinprick in all 3 divisions bilaterally. Corneal responses are intact.  CN VII: Face is symmetric with normal eye closure and smile. CN VIII: Hearing is normal to rubbing fingers CN IX, X: Palate elevates symmetrically. Phonation is normal. CN XI: Head turning and shoulder shrug are intact CN XII: Tongue is midline with normal movements and no atrophy.  MOTOR: Muscle bulk and tone are normal. Muscle strength is normal.Limited range of motion of right shoulder, no significant weakness notice, right hip pain on cross leg examination  REFLEXES: Reflexes are 2+ and symmetric at the biceps, triceps, knees, and ankles. Plantar responses are flexor.  SENSORY: Intact to light touch, pinprick, positional sensation and vibratory sensation are intact in fingers and toes.  COORDINATION: Rapid alternating movements and fine finger movements are intact. There is no dysmetria on finger-to-nose and heel-knee-shin.    GAIT/STANCE: Antalgic gait, cautious  Romberg is absent.   DIAGNOSTIC DATA (LABS, IMAGING, TESTING) - I reviewed patient records, labs, notes, testing and imaging myself where available.   ASSESSMENT AND PLAN  CURTIS URIARTE is a 66 y.o. female   Right  hip pain,  There is no evidence of right lumbar radiculopathy by electrodiagnostic study, and MRI of lumbar  X-ray of right hip  Mobic as needed New-onset right shoulder pain, history of right rotator cuff  Most consistent with musculoskeletal etiology, will refer her to orthopedic surgeon,  Physical therapy   Chronic migraine  Continue Topamax 50 twice a day, nortriptyline 25 mg every day  Maxalt as needed    Marcial Pacas, M.D. Ph.D.  [facility] [address], [place], [place] [address] Ph: [phone] Fax: [phone]  CC: Biagio Borg, MD

## 2016-03-20 ENCOUNTER — Telehealth: Payer: Self-pay | Admitting: Neurology

## 2016-03-20 ENCOUNTER — Other Ambulatory Visit: Payer: Self-pay | Admitting: *Deleted

## 2016-03-20 DIAGNOSIS — M25551 Pain in right hip: Secondary | ICD-10-CM

## 2016-03-20 NOTE — Telephone Encounter (Signed)
Please call patient x-ray of right hip showed moderate degenerative changes, progressed significantly compared to previous x-ray in May 2017, this will explain a lot of her right low back and hip pain,  She will benefit orthopedic evaluation, may get refer through her primary care physician.  Moderate degenerative change in the right hip joint with joint space narrowing and spurring. This has progressed significantly since 05/24/2015. Negative for fracture or AVN. No pelvic fracture.

## 2016-03-20 NOTE — Telephone Encounter (Signed)
X-ray results reviewed with patient.  She would like a referral to Dr. Lara Mulch at [facility].  Dr. Krista Blue has approved the order to be placed in Epic.

## 2016-03-24 NOTE — Telephone Encounter (Signed)
Pt called back today said Dr Ronnie Derby does not treat hips.  She does not want to see Dr Mayer Camel bc he has "1 star", she c/a that appt today. She wants referral to Dr Vella Raring.  Sheryn Bison is requesting referral. Please fax to Margarita Grizzle (f) [phone]. This can wait until Monday.

## 2016-03-27 NOTE — Telephone Encounter (Signed)
Spoke to Patient and she want's to See Dr. Fabio Pierce At [facility] now. Referral has been sent.

## 2016-04-03 ENCOUNTER — Other Ambulatory Visit: Payer: Self-pay | Admitting: Internal Medicine

## 2016-04-03 DIAGNOSIS — Z1231 Encounter for screening mammogram for malignant neoplasm of breast: Secondary | ICD-10-CM

## 2016-04-20 ENCOUNTER — Ambulatory Visit
Admission: RE | Admit: 2016-04-20 | Discharge: 2016-04-20 | Disposition: A | Discharge status: Home / Self Care | Payer: Medicare Other | Source: Ambulatory Visit | Attending: Internal Medicine | Admitting: Internal Medicine

## 2016-04-20 DIAGNOSIS — Z1231 Encounter for screening mammogram for malignant neoplasm of breast: Secondary | ICD-10-CM

## 2016-04-21 ENCOUNTER — Other Ambulatory Visit: Payer: Self-pay | Admitting: Gynecology

## 2016-04-21 DIAGNOSIS — N63 Unspecified lump in unspecified breast: Secondary | ICD-10-CM

## 2016-04-30 ENCOUNTER — Encounter (HOSPITAL_COMMUNITY): Payer: Self-pay | Admitting: Emergency Medicine

## 2016-04-30 ENCOUNTER — Emergency Department (HOSPITAL_COMMUNITY)
Admission: EM | Admit: 2016-04-30 | Discharge: 2016-04-30 | Disposition: A | Discharge status: Home / Self Care | Payer: Medicare Other | Attending: Emergency Medicine | Admitting: Emergency Medicine

## 2016-04-30 DIAGNOSIS — Z7982 Long term (current) use of aspirin: Secondary | ICD-10-CM | POA: Diagnosis not present

## 2016-04-30 DIAGNOSIS — M25511 Pain in right shoulder: Secondary | ICD-10-CM | POA: Diagnosis present

## 2016-04-30 DIAGNOSIS — J449 Chronic obstructive pulmonary disease, unspecified: Secondary | ICD-10-CM | POA: Insufficient documentation

## 2016-04-30 DIAGNOSIS — E039 Hypothyroidism, unspecified: Secondary | ICD-10-CM | POA: Diagnosis not present

## 2016-04-30 DIAGNOSIS — G8929 Other chronic pain: Secondary | ICD-10-CM | POA: Insufficient documentation

## 2016-04-30 MED ORDER — HYDROCODONE-ACETAMINOPHEN 5-325 MG PO TABS: 1.0000 | ORAL_TABLET | ORAL | 0 refills | Status: DC | PRN

## 2016-04-30 NOTE — ED Notes (Signed)
Pt getting dressed.

## 2016-04-30 NOTE — ED Provider Notes (Signed)
[facility] DEPT Provider Note   CSN: [id number] Arrival date & time: 04/30/16  8889     History   Chief Complaint Chief Complaint  Patient presents with  . Extremity Weakness    HPI [name] is a 66 y.o. female with a past medical history of fibromyalgia, chronic pain, chronic shoulder pain. Presents because she needs an MRI of her shoulder. Patient states that she was scheduled for an MRI outpatient. However, when the MRI technicians called to set up the appointment. She did "not have the $100 co-pay" at the time of the call. Patient states that her shoulder hurts significantly. This is chronic pain. Patient states that it is worse when she tries to move the arm and sometimes wakes her from sleep. She has some intermittent numbness and tingling in her hand. However, it is not constant. She denies any weakness in the arm. The patient denies any pain with neck movement or history of neck degenerative changes. The patient did not attempt to reschedule her MRI for a time when she would have the co-pay. She denies any inability to use the arm. However, it is limited due to her pain. She is already on Duragesic patches. She is also been taking Tylenol. She has a history of chronic fibromyalgia. Patient also complains of one episode of chest pain that occurred 2 or 3 weeks ago. She states that she had chest pain that was constant for about 12 hours. So, worse with movement of her left shoulder. She denies any shortness of breath, nausea, or diaphoresis. Patient states that she stayed in bed. She is concerned because she thinks she may have a problem with her heart. She denies any chest pain, shortness of breath, nausea, vomiting or diaphoresis. Currently or within the last several days.  HPI  Past Medical History:  Diagnosis Date  . Anxiety   . Arthritis   . Asthma   . Asthma   . Biliary dyskinesia   . Bowel obstruction   . BRCA negative 02/2013  . Cervical dysplasia   .  Complication of anesthesia    "I have the shakes"  . Depression   . Diverticulosis   . Elevated cholesterol   . Endometriosis   . Fibromyalgia   . GERD (gastroesophageal reflux disease)   . Heart murmur    mvp  . Hx of oral aphthous ulcers   . Hypertension   . Hypertension    no med  . Hypothyroidism    hx nodule on thyroid   . Impaired glucose tolerance 02/25/2011  . Low back pain   . Migraine   . Migraines   . Nocturia   . Polycythemia vera ([place])   . Shingles   . Shortness of breath    with exertion  . Sickle cell trait ([place])   . Sleep apnea    has not used c-pap x 1 yr. use mouthguard now  . Thyroid disease   . Thyroid disease    Hypothyroid    Patient Active Problem List   Diagnosis Date Noted  . Right hip pain 03/17/2016  . Right groin pain 08/19/2015  . Diarrhea 07/22/2015  . Right low back pain 07/22/2015  . Left lower quadrant pain 08/21/2014  . Bloating 08/21/2014  . Nausea without vomiting 08/21/2014  . Panniculitis 06/12/2014  . Genetic testing 06/04/2014  . Family history of breast cancer 06/04/2014  . Peripheral edema 04/29/2014  . Bilateral leg edema 04/15/2014  . Mouth pain 03/13/2014  .  Pain of right great toe 03/13/2014  . COPD exacerbation ([place]) 02/06/2014  . Shingles outbreak 12/30/2013  . Thrush 12/30/2013  . Trigger point of thoracic region 12/30/2013  . Migraine headache 11/13/2013  . LUQ pain 05/08/2013  . Polycythemia, secondary 09/22/2012  . Chest pain 03/27/2012  . Cervical dysplasia   . Spell of dizziness 10/14/2011  . Bruising 10/14/2011  . High risk medication use 10/14/2011  . Lesion of mouth 05/30/2011  . Chronic sinusitis 05/30/2011  . Encounter for preventative adult health care exam with abnormal findings 02/25/2011  . Impaired glucose tolerance 02/25/2011  . Endometriosis   . OTHER&UNSPECIFIED DISEASES THE ORAL SOFT TISSUES 03/30/2010  . Family hx of colon cancer 03/11/2010  . THYROID NODULE, RIGHT 03/01/2010  .  BACK PAIN 03/01/2010  . GOITER 10/26/2008  . CONSTIPATION 11/18/2007  . LUMBAR RADICULOPATHY, LEFT 11/07/2007  . HYPOTHYROIDISM 05/31/2007  . ANXIETY 05/31/2007  . ASTHMA 05/31/2007  . COPD (chronic obstructive pulmonary disease) ([place]) 05/31/2007  . COLONIC POLYPS, HX OF 05/31/2007  . HYPERLIPIDEMIA 09/22/2006  . Obesity 09/22/2006  . SICKLE CELL TRAIT 09/22/2006  . Depression 09/22/2006  . OSA (obstructive sleep apnea) 09/22/2006  . Essential hypertension 09/22/2006  . VENOUS INSUFFICIENCY 09/22/2006  . ALLERGIC RHINITIS 09/22/2006  . GERD 09/22/2006  . PEPTIC ULCER DISEASE 09/22/2006  . Irritable bowel syndrome 09/22/2006  . OSTEOARTHRITIS 09/22/2006  . FIBROMYALGIA 09/22/2006  . OSTEOPOROSIS 09/22/2006  . FATIGUE, CHRONIC 09/22/2006  . PERIPHERAL EDEMA 09/22/2006  . MITRAL VALVE PROLAPSE, HX OF 09/22/2006    Past Surgical History:  Procedure Laterality Date  . ABDOMINAL HYSTERECTOMY    . BREAST SURGERY     Cystectomy on Left breast  . BREAST SURGERY     Benign breast cyst  . CHOLECYSTECTOMY    . CHOLECYSTECTOMY  12/05/2011   Procedure: LAPAROSCOPIC CHOLECYSTECTOMY;  Surgeon: Ralene Ok, MD;  Location: [facility];  Service: General;  Laterality: N/A;  . COLPOSCOPY    . GYNECOLOGIC CRYOSURGERY    . HERNIA REPAIR     abdomen  . OOPHORECTOMY     BSO  . PANNICULECTOMY N/A 06/12/2014   Procedure: PANNICULECTOMY;  Surgeon: Irene Limbo, MD;  Location: [facility];  Service: Plastics;  Laterality: N/A;  . PELVIC LAPAROSCOPY  1995,2002   DL LSO DL RSO  . TONSILLECTOMY    . VAGINAL HYSTERECTOMY  1983    OB History    Gravida Para Term Preterm AB Living   _0 0 1     SAB TAB Ectopic Multiple Live Births   0 0 0           Home Medications    Prior to Admission medications   Medication Sig Start Date End Date Taking? Authorizing Provider  albuterol (PROVENTIL HFA;VENTOLIN HFA) 108 (90 BASE) MCG/ACT inhaler Inhale 2 puffs into the lungs every  6 (six) hours as needed for wheezing. For wheezing 02/06/14  Yes Biagio Borg, MD  aspirin EC 81 MG EC tablet Take 1 tablet (81 mg total) by mouth daily. 03/28/12  Yes Reyne Dumas, MD  benzonatate (TESSALON PERLES) 100 MG capsule 1-2 tabs by mouth every 6 hrs as needed for cough 01/28/16  Yes Biagio Borg, MD  Calcium Carbonate-Vitamin D (CALCIUM + D PO) Take 2 tablets by mouth at bedtime.    Yes Historical Provider, MD  fentaNYL (DURAGESIC - DOSED MCG/HR) 25 MCG/HR Place 1 patch onto the skin every 3 (three) days.   Yes Historical Provider,  MD  fluticasone (FLONASE) 50 MCG/ACT nasal spray Place 2 sprays into both nostrils as needed. Patient taking differently: Place 2 sprays into both nostrils as needed for allergies or rhinitis.  02/06/14  Yes Biagio Borg, MD  hydrochlorothiazide (HYDRODIURIL) 25 MG tablet Take 1 tablet (25 mg total) by mouth daily as needed (for edema). 04/29/14  Yes Biagio Borg, MD  lidocaine (LIDODERM) 5 % Place 1 patch onto the skin as needed (pain). 07/22/15  Yes Biagio Borg, MD  naproxen (NAPROSYN) 500 MG tablet Take 1 tablet (500 mg total) by mouth daily as needed (migraines). 07/22/15  Yes Biagio Borg, MD  NONFORMULARY OR COMPOUNDED ITEM Boric Acid suppositories 600 mg 08/05/15  Yes Anastasio Auerbach, MD  ondansetron (ZOFRAN ODT) 4 MG disintegrating tablet 45m ODT q4 hours prn nausea/vomit 07/22/15  Yes JBiagio Borg MD  Polyethyl Glycol-Propyl Glycol (SYSTANE) 0.4-0.3 % GEL ophthalmic gel Place 1 application into both eyes daily. 07/22/15  Yes JBiagio Borg MD  polyethylene glycol powder ([facility] powder Take 17 g by mouth as needed. For constipation 07/22/15  Yes JBiagio Borg MD  rizatriptan (MAXALT-MLT) 5 MG disintegrating tablet Take 1 tablet (5 mg total) by mouth as needed. May repeat in 2 hours if needed 12/29/15  Yes YMarcial Pacas MD  thyroid (ARMOUR) 30 MG tablet Take 1 tablet (30 mg total) by mouth daily before breakfast. 02/06/14  Yes JBiagio Borg MD  topiramate (TOPAMAX) 50  MG tablet Take 1 tablet (50 mg total) by mouth 2 (two) times daily. Patient taking differently: Take 50 mg by mouth daily.  07/22/15  Yes JBiagio Borg MD  dicyclomine (BENTYL) 20 MG tablet Take 1 tablet (20 mg total) by mouth 2 (two) times daily. Patient not taking: Reported on 04/30/2016 08/19/14   PWillia Craze NP  Estradiol Acetate ([facility] 0.05 MG/24HR RING Place 1 each vaginally every 3 (three) months. Patient not taking: Reported on 04/30/2016 08/05/15   TAnastasio Auerbach MD  HYDROcodone-acetaminophen (NORCO) 5-325 MG tablet Take 1 tablet by mouth every 4 (four) hours as needed. 04/30/16   AMargarita Mail PA-C  ibuprofen (ADVIL,MOTRIN) 800 MG tablet Take 1 tablet (800 mg total) by mouth every 8 (eight) hours as needed. Patient not taking: Reported on 04/30/2016 07/22/15   JBiagio Borg MD  lansoprazole (PREVACID) 30 MG capsule Take 1 capsule (30 mg total) by mouth 2 (two) times daily before a meal. Patient not taking: Reported on 04/30/2016 04/15/14   MLadene Artist MD  meloxicam (MOBIC) 15 MG tablet Take 1 tablet (15 mg total) by mouth daily. 01/14/16   YMarcial Pacas MD  nortriptyline (PAMELOR) 25 MG capsule Take 2 capsules (50 mg total) by mouth at bedtime. 12/29/15   YMarcial Pacas MD    Family History Family History  Problem Relation Age of Onset  . Hypertension Mother   . Heart disease Mother   . Breast cancer Mother     Age 34256 . Bone cancer Mother   . Hypertension Father   . Heart disease Father   . Diabetes Father   . Colon cancer Sister 518 . Sarcoidosis Sister   . Breast cancer Sister 671 . Breast cancer Maternal Grandmother     Age 64 . Ovarian cancer Paternal Grandmother   . Breast cancer Paternal Grandmother     Age unknown  . Sarcoidosis Brother   . Breast cancer Maternal Aunt     Age 64's .  Breast cancer Maternal Aunt     Age 86  . Heart failure Father   . Arthritis Father   . Colon cancer Sister   . Sarcoidosis Sister   . Multiple sclerosis Cousin     Social  History Social History  Substance Use Topics  . Smoking status: Never Smoker  . Smokeless tobacco: Never Used  . Alcohol use 0.0 oz/week     Comment: socially     Allergies   Celecoxib; Codeine; Codeine; Duloxetine; Erythromycin; Escitalopram oxalate; Ezetimibe; Gluten meal; Lactose intolerance (gi); Lyrica [pregabalin]; Milnacipran; Pregabalin; Rofecoxib; Statins; Statins; Tramadol; Ultram [tramadol hcl]; Celebrex [celecoxib]; and Contrast media [iodinated diagnostic agents]   Review of Systems Review of Systems  Ten systems reviewed and are negative for acute change, except as noted in the HPI.   Physical Exam Updated Vital Signs BP 131/68   Pulse 65   Temp 98.4 F (36.9 C)   Resp 14   SpO2 98%   Physical Exam  Constitutional: She is oriented to person, place, and time. She appears well-developed and well-nourished. No distress.  HENT:  Head: Normocephalic and atraumatic.  Eyes: Conjunctivae are normal. No scleral icterus.  Neck: Normal range of motion.  Cardiovascular: Normal rate, regular rhythm and normal heart sounds.  Exam reveals no gallop and no friction rub.   No murmur heard. Pulmonary/Chest: Effort normal and breath sounds normal. No respiratory distress.  Abdominal: Soft. Bowel sounds are normal. She exhibits no distension and no mass. There is no tenderness. There is no guarding.  Musculoskeletal: She exhibits tenderness. She exhibits no edema or deformity.       Right shoulder: She exhibits decreased range of motion and tenderness. She exhibits no swelling, no effusion, no deformity, no spasm and normal pulse.  Temperature to palpation over anterior right shoulder including the proximal portion of the right short head of the biceps tendon. Pain at the before meals joint. Patient gives poor effort with any active range of motion of the shoulder. Pain is also present with passive range of motion of the shoulder. She has normal and equal grip strengths bilaterally.  She has a normal ipsilateral elbow and wrist examination.  Neurological: She is alert and oriented to person, place, and time.  Skin: Skin is warm and dry. She is not diaphoretic.  Psychiatric: Her behavior is normal.  Nursing note and vitals reviewed.    ED Treatments / Results  Labs (all labs ordered are listed, but only abnormal results are displayed) Labs Reviewed - No data to display  EKG  EKG Interpretation  Date/Time:  Sunday April 30 2016 11:37:39 EDT Ventricular Rate:  62 PR Interval:    QRS Duration: 106 QT Interval:  391 QTC Calculation: 397 R Axis:   50 Text Interpretation:  Sinus rhythm Minimal ST elevation, anterior leads Confirmed by RAY MD, DANIELLE ([id number]) on 04/30/2016 11:39:53 AM       Radiology No results found.  Procedures Procedures (including critical care time)  Medications Ordered in ED Medications - No data to display   Initial Impression / Assessment and Plan / ED Course  I have reviewed the triage vital signs and the nursing notes.  Pertinent labs & imaging results that were available during my care of the patient were reviewed by me and considered in my medical decision making (see chart for details).   patient EKG unchanged from previous and without signs of significant abnormality or ischemia. She'll follow-up with her PCP for cardiac stress testing and is  chest pain-free. Patient also appears to have musculoskeletal right shoulder pain. Potentially she might have some cervical radiculopathy. However, I do not feel that she is an emergent need for MRI at this time and is advised the patient to reschedule her outpatient MRI. Patient also under pink eye contact. However, she feels that her pain is very poorly controlled. I will give her some pain medication at this time, but had advised her that this may mollify her pain contract and that the illness of reporting this medication is on her period. I have reviewed her medication list in the [place] controlled substances reporting system. The patient appears to get vacation from 1 provider monthly and appears to be very regular and on time with this medication. Patient given sling for comfort. Discussed need to remove and mobilize the shoulder 2-3 times daily. She appears safe for discharge at this time. Discussed return precautions.  Final Clinical Impressions(s) / ED Diagnoses   Final diagnoses:  Chronic right shoulder pain    New Prescriptions New Prescriptions   HYDROCODONE-ACETAMINOPHEN (NORCO) 5-325 MG TABLET    Take 1 tablet by mouth every 4 (four) hours as needed.     Margarita Mail, PA-C 04/30/16 [place], MD 05/03/16 1322

## 2016-04-30 NOTE — Discharge Instructions (Signed)
Your EKG is unchanged from your previous EKGs. You should follow up with your pcp or cardiologist regarding the episode of pain your had previously. Please follow up with your orthopedist about your shoulder MRI and Your pain management specialist about your pain medications. Please take your shoulder out of the splint and mobilize it 2-3 times a day to prevent frozen shoulder.  Contact a health care provider if: Your pain is getting worse. Your pain is not relieved with medicines. You lose function in the area of the pain if the pain is in your arms, legs, or neck.

## 2016-04-30 NOTE — ED Triage Notes (Addendum)
Pt reports several months of right sided arm pain, unable to use the arm. Pt reports a year ago she fell, fell again 3 months ago and that is when her arm began to hurt. Pt reports she was scheduled for an MRI of the arm but could not afford the 100$ co pay. Pt reports pain is worse and she needs the MRI.  *Pt also reports episodes of chest pain over the past 3 months that she cannot move during. No chest pain at present.

## 2016-04-30 NOTE — ED Notes (Signed)
PA at bedside.

## 2016-04-30 NOTE — Progress Notes (Signed)
Orthopedic Tech Progress Note Patient Details:  [name] 02/12/1950 [id number]  Ortho Devices Type of Ortho Device: Shoulder immobilizer Ortho Device/Splint Interventions: Application   [place] Pink 04/30/2016, 11:13 AM

## 2016-05-01 ENCOUNTER — Other Ambulatory Visit: Payer: Self-pay | Admitting: Sports Medicine

## 2016-05-01 DIAGNOSIS — M25511 Pain in right shoulder: Principal | ICD-10-CM

## 2016-05-01 DIAGNOSIS — G8929 Other chronic pain: Secondary | ICD-10-CM

## 2016-05-11 ENCOUNTER — Ambulatory Visit
Admission: RE | Admit: 2016-05-11 | Discharge: 2016-05-11 | Disposition: A | Discharge status: Home / Self Care | Payer: Medicare Other | Source: Ambulatory Visit | Attending: Sports Medicine | Admitting: Sports Medicine

## 2016-05-11 DIAGNOSIS — G8929 Other chronic pain: Secondary | ICD-10-CM

## 2016-05-11 DIAGNOSIS — M25511 Pain in right shoulder: Principal | ICD-10-CM

## 2016-09-06 ENCOUNTER — Other Ambulatory Visit: Payer: Self-pay | Admitting: Internal Medicine

## 2016-09-08 ENCOUNTER — Ambulatory Visit (INDEPENDENT_AMBULATORY_CARE_PROVIDER_SITE_OTHER): Payer: Medicare Other | Admitting: Internal Medicine

## 2016-09-08 ENCOUNTER — Encounter: Payer: Self-pay | Admitting: Internal Medicine

## 2016-09-08 ENCOUNTER — Other Ambulatory Visit (INDEPENDENT_AMBULATORY_CARE_PROVIDER_SITE_OTHER): Payer: Medicare Other

## 2016-09-08 VITALS — BP 130/86 | HR 92 | Temp 98.3°F | Ht 64.0 in | Wt 217.0 lb

## 2016-09-08 DIAGNOSIS — I10 Essential (primary) hypertension: Secondary | ICD-10-CM

## 2016-09-08 DIAGNOSIS — E039 Hypothyroidism, unspecified: Secondary | ICD-10-CM

## 2016-09-08 DIAGNOSIS — J438 Other emphysema: Secondary | ICD-10-CM

## 2016-09-08 MED ORDER — THYROID 30 MG PO TABS: 30.0000 mg | ORAL_TABLET | Freq: Every day | ORAL | 3 refills | Status: DC

## 2016-09-08 MED ORDER — FLUTICASONE-SALMETEROL 250-50 MCG/DOSE IN AEPB: 1.0000 | INHALATION_SPRAY | Freq: Two times a day (BID) | RESPIRATORY_TRACT | 11 refills | Status: DC

## 2016-09-08 NOTE — Patient Instructions (Signed)
Please take all new medication as prescribed - the advair  Please continue all other medications as before, and refills have been done if requested.  Please have the pharmacy call with any other refills you may need.  Please continue your efforts at being more active, low cholesterol diet, and weight control.  You are otherwise up to date with prevention measures today.  Please keep your appointments with your specialists as you may have planned  Please go to the LAB in the Basement (turn left off the elevator) for the tests to be done today  You will be contacted by phone if any changes need to be made immediately.  Otherwise, you will receive a letter about your results with an explanation, but please check with MyChart first.  Please remember to sign up for MyChart if you have not done so, as this will be important to you in the future with finding out test results, communicating by private email, and scheduling acute appointments online when needed.  Emmit Alexanders with your [facility]!

## 2016-09-08 NOTE — Progress Notes (Signed)
Subjective:    Patient ID: [name], female    DOB: [birth year], 66 y.o.   MRN: [id number]  HPI  Here to f/u; overall doing ok,  Pt denies chest pain, orthopnea, PND, increased LE swelling, palpitations, dizziness or syncope, but has been having 1-2 mo intermittent persistent sounding wheezing with sob/doe not always well resolved with albuterol MDI.  Pt denies new neurological symptoms such as new headache, or facial or extremity weakness or numbness.  Pt denies polydipsia, polyuria, or low sugar episode.  Pt states overall good compliance with meds,  Denies hyper or hypo thyroid symptoms such as voice, skin or hair change.  Also due for right hip replacement in [place] per Dr Ennis Forts; Also with current right frozen shoulder with pain as well.  Has ongoing pain for at least 1 yr, some things did not get done due to limited funds. Due for mammogram next wk.  Had negative cologuard Past Medical History:  Diagnosis Date  . Anxiety   . Arthritis   . Asthma   . Asthma   . Biliary dyskinesia   . Bowel obstruction ([place])   . BRCA negative 02/2013  . Cervical dysplasia   . Complication of anesthesia    "I have the shakes"  . Depression   . Diverticulosis   . Elevated cholesterol   . Endometriosis   . Fibromyalgia   . GERD (gastroesophageal reflux disease)   . Heart murmur    mvp  . Hx of oral aphthous ulcers   . Hypertension   . Hypertension    no med  . Hypothyroidism    hx nodule on thyroid   . Impaired glucose tolerance 02/25/2011  . Low back pain   . Migraine   . Migraines   . Nocturia   . Polycythemia vera ([place])   . Shingles   . Shortness of breath    with exertion  . Sickle cell trait ([place])   . Sleep apnea    has not used c-pap x 1 yr. use mouthguard now  . Thyroid disease   . Thyroid disease    Hypothyroid   Past Surgical History:  Procedure Laterality Date  . ABDOMINAL HYSTERECTOMY    . BREAST SURGERY     Cystectomy on Left breast  . BREAST SURGERY     Benign  breast cyst  . CHOLECYSTECTOMY    . CHOLECYSTECTOMY  12/05/2011   Procedure: LAPAROSCOPIC CHOLECYSTECTOMY;  Surgeon: Ralene Ok, MD;  Location: [facility];  Service: General;  Laterality: N/A;  . COLPOSCOPY    . GYNECOLOGIC CRYOSURGERY    . HERNIA REPAIR     abdomen  . OOPHORECTOMY     BSO  . PANNICULECTOMY N/A 06/12/2014   Procedure: PANNICULECTOMY;  Surgeon: Irene Limbo, MD;  Location: [facility];  Service: Plastics;  Laterality: N/A;  . PELVIC LAPAROSCOPY  1995,2002   DL LSO DL RSO  . TONSILLECTOMY    . VAGINAL HYSTERECTOMY  1983    reports that she has never smoked. She has never used smokeless tobacco. She reports that she drinks alcohol. She reports that she does not use drugs. family history includes Arthritis in her father; Bone cancer in her mother; Breast cancer in her maternal aunt, maternal aunt, maternal grandmother, mother, and paternal grandmother; Breast cancer (age of onset: 34) in her sister; Colon cancer in her sister; Colon cancer (age of onset: 34) in her sister; Diabetes in her father; Heart disease in her father and mother;  Heart failure in her father; Hypertension in her father and mother; Multiple sclerosis in her cousin; Ovarian cancer in her paternal grandmother; Sarcoidosis in her brother, sister, and sister. Allergies  Allergen Reactions  . Celecoxib Nausea Only and Nausea And Vomiting  . Codeine Nausea Only    REACTION: Nausea  . Codeine Nausea And Vomiting  . Duloxetine Nausea And Vomiting    REACTION: n/v REACTION: n/v  . Erythromycin     REACTION: Itching  . Escitalopram Oxalate Nausea Only  . Ezetimibe Nausea And Vomiting  . Gluten Meal     Intolerant to gluten  . Lactose Intolerance (Gi)   . Lyrica [Pregabalin] Nausea And Vomiting  . Milnacipran Nausea And Vomiting    REACTION: n/v REACTION: n/v  . Pregabalin Itching and Nausea And Vomiting    REACTION: wt gain REACTION: wt gain  . Rofecoxib Nausea And Vomiting  .  Statins Nausea And Vomiting    Body aches  . Statins     Body aches  . Tramadol Nausea Only  . Ultram [Tramadol Hcl] Nausea And Vomiting  . Celebrex [Celecoxib] Swelling and Rash  . Contrast Media [Iodinated Diagnostic Agents] Hives    Patient has history of hives from Ionic contrast injection in 1980's. No allergy to Non-Ionic contrast. 05-09-2014 rsm   Current Outpatient Prescriptions on File Prior to Visit  Medication Sig Dispense Refill  . albuterol (PROVENTIL HFA;VENTOLIN HFA) 108 (90 BASE) MCG/ACT inhaler Inhale 2 puffs into the lungs every 6 (six) hours as needed for wheezing. For wheezing 3.7 g 11  . aspirin EC 81 MG EC tablet Take 1 tablet (81 mg total) by mouth daily. 30 tablet 0  . Calcium Carbonate-Vitamin D (CALCIUM + D PO) Take 2 tablets by mouth at bedtime.     . dicyclomine (BENTYL) 20 MG tablet Take 1 tablet (20 mg total) by mouth 2 (two) times daily. 60 tablet 1  . Estradiol Acetate (FEMRING) 0.05 MG/24HR RING Place 1 each vaginally every 3 (three) months. 1 each 4  . fentaNYL (DURAGESIC - DOSED MCG/HR) 25 MCG/HR Place 1 patch onto the skin every 3 (three) days.    . fluticasone (FLONASE) 50 MCG/ACT nasal spray Place 2 sprays into both nostrils as needed. (Patient taking differently: Place 2 sprays into both nostrils as needed for allergies or rhinitis. ) 16 g 11  . hydrochlorothiazide (HYDRODIURIL) 25 MG tablet Take 1 tablet (25 mg total) by mouth daily as needed (for edema). 90 tablet 3  . HYDROcodone-acetaminophen (NORCO) 5-325 MG tablet Take 1 tablet by mouth every 4 (four) hours as needed. 10 tablet 0  . ibuprofen (ADVIL,MOTRIN) 800 MG tablet Take 1 tablet (800 mg total) by mouth every 8 (eight) hours as needed. 60 tablet 3  . lansoprazole (PREVACID) 30 MG capsule Take 1 capsule (30 mg total) by mouth 2 (two) times daily before a meal. 60 capsule 11  . lidocaine (LIDODERM) 5 % Place 1 patch onto the skin as needed (pain). 30 patch 0  . meloxicam (MOBIC) 15 MG tablet Take  1 tablet (15 mg total) by mouth daily. 30 tablet 6  . naproxen (NAPROSYN) 500 MG tablet Take 1 tablet (500 mg total) by mouth daily as needed (migraines). 30 tablet 0  . NONFORMULARY OR COMPOUNDED ITEM Boric Acid suppositories 600 mg 30 each 2  . nortriptyline (PAMELOR) 25 MG capsule Take 2 capsules (50 mg total) by mouth at bedtime. 60 capsule 11  . Polyethyl Glycol-Propyl Glycol (SYSTANE) 0.4-0.3 % GEL ophthalmic  gel Place 1 application into both eyes daily. 10 mL 12  . polyethylene glycol powder (MIRALAX) powder Take 17 g by mouth as needed. For constipation 255 g 0  . rizatriptan (MAXALT-MLT) 5 MG disintegrating tablet Take 1 tablet (5 mg total) by mouth as needed. May repeat in 2 hours if needed 15 tablet 6  . topiramate (TOPAMAX) 50 MG tablet Take 1 tablet (50 mg total) by mouth 2 (two) times daily. (Patient taking differently: Take 50 mg by mouth daily. ) 60 tablet 11   No current facility-administered medications on file prior to visit.    Review of Systems  Constitutional: Negative for other unusual diaphoresis or sweats HENT: Negative for ear discharge or swelling Eyes: Negative for other worsening visual disturbances Respiratory: Negative for stridor or other swelling  Gastrointestinal: Negative for worsening distension or other blood Genitourinary: Negative for retention or other urinary change Musculoskeletal: Negative for other MSK pain or swelling Skin: Negative for color change or other new lesions Neurological: Negative for worsening tremors and other numbness  Psychiatric/Behavioral: Negative for worsening agitation or other fatigue     Objective:   Physical Exam BP 130/86   Pulse 92   Temp 98.3 F (36.8 C)   Ht '5\' 4"'$  (1.626 m)   Wt 217 lb (98.4 kg)   SpO2 99%   BMI 37.25 kg/m  VS noted,  Constitutional: Pt appears in NAD HENT: Head: NCAT.  Right Ear: External ear normal.  Left Ear: External ear normal.  Eyes: . Pupils are equal, round, and reactive to light.  Conjunctivae and EOM are normal Nose: without d/c or deformity Neck: Neck supple. Gross normal ROM Cardiovascular: Normal rate and regular rhythm.   Pulmonary/Chest: Effort normal and breath sounds decreased without rales or wheezing.  Abd:  Soft, NT, ND, + BS, no organomegaly Neurological: Pt is alert. At baseline orientation, motor grossly intact Skin: Skin is warm. No rashes, other new lesions, no LE edema Psychiatric: Pt behavior is normal without agitation , mild nervous    Assessment & Plan:

## 2016-09-10 NOTE — Assessment & Plan Note (Signed)
stable overall by history and exam, recent data reviewed with pt, and pt to continue medical treatment as before,  to f/u any worsening symptoms or concerns BP Readings from Last 3 Encounters:  09/08/16 130/86  04/30/16 131/68  12/29/15 122/88

## 2016-09-10 NOTE — Assessment & Plan Note (Signed)
With increased symptoms recent, possibly allergic related, for advair bid asd, cont all other tx,  to f/u any worsening symptoms or concerns

## 2016-09-10 NOTE — Assessment & Plan Note (Signed)
stable overall by history and exam and pt to continue medical treatment as before,  to f/u any worsening symptoms or concerns, for f/u TFT's today

## 2016-09-11 ENCOUNTER — Encounter: Payer: Self-pay | Admitting: Internal Medicine

## 2016-09-11 LAB — T4, FREE: FREE T4: 0.92 ng/dL (ref 0.60–1.60)

## 2016-09-11 LAB — TSH: TSH: 2.01 u[IU]/mL (ref 0.35–4.50)

## 2016-09-12 ENCOUNTER — Ambulatory Visit: Payer: Medicare Other

## 2016-09-12 ENCOUNTER — Ambulatory Visit
Admission: RE | Admit: 2016-09-12 | Discharge: 2016-09-12 | Disposition: A | Discharge status: Home / Self Care | Payer: Medicare Other | Source: Ambulatory Visit | Attending: Gynecology | Admitting: Gynecology

## 2016-09-12 DIAGNOSIS — N63 Unspecified lump in unspecified breast: Secondary | ICD-10-CM

## 2016-11-09 ENCOUNTER — Telehealth: Payer: Self-pay | Admitting: Internal Medicine

## 2016-11-09 ENCOUNTER — Encounter: Payer: Self-pay | Admitting: Internal Medicine

## 2016-11-09 ENCOUNTER — Ambulatory Visit (INDEPENDENT_AMBULATORY_CARE_PROVIDER_SITE_OTHER): Payer: Medicare Other | Admitting: Internal Medicine

## 2016-11-09 ENCOUNTER — Other Ambulatory Visit (INDEPENDENT_AMBULATORY_CARE_PROVIDER_SITE_OTHER): Payer: Medicare Other

## 2016-11-09 VITALS — BP 130/84 | HR 82 | Temp 98.2°F | Ht 64.0 in | Wt 145.0 lb

## 2016-11-09 DIAGNOSIS — R109 Unspecified abdominal pain: Secondary | ICD-10-CM | POA: Insufficient documentation

## 2016-11-09 DIAGNOSIS — M79604 Pain in right leg: Secondary | ICD-10-CM

## 2016-11-09 DIAGNOSIS — J438 Other emphysema: Secondary | ICD-10-CM

## 2016-11-09 DIAGNOSIS — H6122 Impacted cerumen, left ear: Secondary | ICD-10-CM

## 2016-11-09 DIAGNOSIS — R1030 Lower abdominal pain, unspecified: Secondary | ICD-10-CM | POA: Diagnosis not present

## 2016-11-09 DIAGNOSIS — H9192 Unspecified hearing loss, left ear: Secondary | ICD-10-CM

## 2016-11-09 LAB — HEPATIC FUNCTION PANEL
ALK PHOS: 72 U/L (ref 39–117)
ALT: 7 U/L (ref 0–35)
AST: 13 U/L (ref 0–37)
Albumin: 3.6 g/dL (ref 3.5–5.2)
BILIRUBIN DIRECT: 0.1 mg/dL (ref 0.0–0.3)
BILIRUBIN TOTAL: 0.5 mg/dL (ref 0.2–1.2)
Total Protein: 7.1 g/dL (ref 6.0–8.3)

## 2016-11-09 LAB — BASIC METABOLIC PANEL
BUN: 10 mg/dL (ref 6–23)
CO2: 29 mEq/L (ref 19–32)
Calcium: 9.2 mg/dL (ref 8.4–10.5)
Chloride: 106 mEq/L (ref 96–112)
Creatinine, Ser: 0.93 mg/dL (ref 0.40–1.20)
GFR: 78.06 mL/min (ref >60.00)
GLUCOSE: 87 mg/dL (ref 70–99)
Potassium: 3.3 mEq/L — ABNORMAL LOW (ref 3.5–5.1)
Sodium: 141 mEq/L (ref 135–145)

## 2016-11-09 LAB — CBC WITH DIFFERENTIAL/PLATELET
BASOS PCT: 0.3 % (ref 0.0–3.0)
Basophils Absolute: 0 10*3/uL (ref 0.0–0.1)
EOS PCT: 1.2 % (ref 0.0–5.0)
Eosinophils Absolute: 0.1 10*3/uL (ref 0.0–0.7)
HCT: 46 % (ref 36.0–46.0)
Hemoglobin: 14.7 g/dL (ref 12.0–15.0)
LYMPHS ABS: 1.8 10*3/uL (ref 0.7–4.0)
Lymphocytes Relative: 27.9 % (ref 12.0–46.0)
MCHC: 31.9 g/dL (ref 30.0–36.0)
MCV: 90.5 fl (ref 78.0–100.0)
MONO ABS: 0.4 10*3/uL (ref 0.1–1.0)
Monocytes Relative: 6 % (ref 3.0–12.0)
NEUTROS ABS: 4.2 10*3/uL (ref 1.4–7.7)
Neutrophils Relative %: 64.6 % (ref 43.0–77.0)
Platelets: 257 10*3/uL (ref 150.0–400.0)
RBC: 5.08 Mil/uL (ref 3.87–5.11)
RDW: 15.2 % (ref 11.5–15.5)
WBC: 6.5 10*3/uL (ref 4.0–10.5)

## 2016-11-09 LAB — URINALYSIS, ROUTINE W REFLEX MICROSCOPIC
BILIRUBIN URINE: NEGATIVE
HGB URINE DIPSTICK: NEGATIVE
Ketones, ur: NEGATIVE
Leukocytes, UA: NEGATIVE
Nitrite: NEGATIVE
PH: 5.5 (ref 5.0–8.0)
RBC / HPF: NONE SEEN (ref 0–?)
Specific Gravity, Urine: >=1.03 — AB (ref 1.000–1.030)
Total Protein, Urine: NEGATIVE
Urine Glucose: NEGATIVE
Urobilinogen, UA: 0.2 (ref 0.0–1.0)

## 2016-11-09 LAB — LIPASE: LIPASE: 10 U/L — AB (ref 11.0–59.0)

## 2016-11-09 MED ORDER — AMOXICILLIN-POT CLAVULANATE 875-125 MG PO TABS: 1.0000 | ORAL_TABLET | Freq: Two times a day (BID) | ORAL | 0 refills | Status: DC

## 2016-11-09 NOTE — Patient Instructions (Signed)
Your left ear was irrigated of wax today  Please take all new medication as prescribed - the antibiotic  Please continue all other medications as before, and refills have been done if requested.  Please have the pharmacy call with any other refills you may need.  Please keep your appointments with your specialists as you may have planned  Please go to the LAB in the Basement (turn left off the elevator) for the tests to be done today  You will be contacted by phone if any changes need to be made immediately.  Otherwise, you will receive a letter about your results with an explanation, but please check with MyChart first.  Please remember to sign up for MyChart if you have not done so, as this will be important to you in the future with finding out test results, communicating by private email, and scheduling acute appointments online when needed.

## 2016-11-09 NOTE — Progress Notes (Addendum)
 Subjective:    Patient ID: [name], female    DOB: 02/10/1950, 66 y.o.   MRN: [id number]  HPI  Here to f/u now S/p right hip replacement complicated by recurring site hematoma s/p needle drainage x 2, the last draw done x 3 wks, had elevated WBC's, and Cx indeterminate per pt, Since then still has low grade temp daily and persistent right prox leg and groin pain. Nurse with Dr Howe's office in [place] - [facility]) and d/w pt that she would likely need at least a urine testing today, as some of the right prox leg pain also involves pain to the inguinal and mid low abdomen.  Denies urinary symptoms such as dysuria, frequency, urgency, flank pain, hematuria or n/v, fever, chills.   Also incidentally with acute onset left ear hearing loss - ? Wax again.  No other ear pain, dizziness or d/c  Pt denies chest pain, increased sob or doe, wheezing, orthopnea, PND, increased LE swelling, palpitations, dizziness or syncope.  Pt denies polydipsia, polyuria Past Medical History:  Diagnosis Date  . Anxiety   . Arthritis   . Asthma   . Asthma   . Biliary dyskinesia   . Bowel obstruction ([facility])   . BRCA negative 02/2013  . Cervical dysplasia   . Complication of anesthesia    "I have the shakes"  . Depression   . Diverticulosis   . Elevated cholesterol   . Endometriosis   . Fibromyalgia   . GERD (gastroesophageal reflux disease)   . Heart murmur    mvp  . Hx of oral aphthous ulcers   . Hypertension   . Hypertension    no med  . Hypothyroidism    hx nodule on thyroid   . Impaired glucose tolerance 02/25/2011  . Low back pain   . Migraine   . Migraines   . Nocturia   . Polycythemia vera ([facility])   . Shingles   . Shortness of breath    with exertion  . Sickle cell trait ([facility])   . Sleep apnea    has not used c-pap x 1 yr. use mouthguard now  . Thyroid disease   . Thyroid disease    Hypothyroid   Past Surgical History:  Procedure Laterality Date  . ABDOMINAL HYSTERECTOMY      . BREAST EXCISIONAL BIOPSY Left 1999  . BREAST SURGERY     Cystectomy on Left breast  . BREAST SURGERY     Benign breast cyst  . CHOLECYSTECTOMY    . CHOLECYSTECTOMY  12/05/2011   Procedure: LAPAROSCOPIC CHOLECYSTECTOMY;  Surgeon: Armando Ramirez, MD;  Location: [facility];  Service: General;  Laterality: N/A;  . COLPOSCOPY    . GYNECOLOGIC CRYOSURGERY    . HERNIA REPAIR     abdomen  . OOPHORECTOMY     BSO  . PANNICULECTOMY N/A 06/12/2014   Procedure: PANNICULECTOMY;  Surgeon: Brinda Thimmappa, MD;  Location: [facility];  Service: Plastics;  Laterality: N/A;  . PELVIC LAPAROSCOPY  1995,2002   DL LSO DL RSO  . TONSILLECTOMY    . VAGINAL HYSTERECTOMY  1983    reports that she has never smoked. She has never used smokeless tobacco. She reports that she drinks alcohol. She reports that she does not use drugs. family history includes Arthritis in her father; Bone cancer in her mother; Breast cancer in her maternal aunt, maternal aunt, maternal grandmother, mother, and paternal grandmother; Breast cancer (age of onset: 66) in her   sister; Colon cancer in her sister; Colon cancer (age of onset: 55) in her sister; Diabetes in her father; Heart disease in her father and mother; Heart failure in her father; Hypertension in her father and mother; Multiple sclerosis in her cousin; Ovarian cancer in her paternal grandmother; Sarcoidosis in her brother, sister, and sister. Allergies  Allergen Reactions  . Celecoxib Nausea Only and Nausea And Vomiting  . Codeine Nausea Only    REACTION: Nausea  . Codeine Nausea And Vomiting  . Duloxetine Nausea And Vomiting    REACTION: n/v REACTION: n/v  . Erythromycin     REACTION: Itching  . Escitalopram Oxalate Nausea Only  . Ezetimibe Nausea And Vomiting  . Gluten Meal     Intolerant to gluten  . Lactose Intolerance (Gi)   . Lyrica [Pregabalin] Nausea And Vomiting  . Milnacipran Nausea And Vomiting    REACTION: n/v REACTION: n/v  .  Pregabalin Itching and Nausea And Vomiting    REACTION: wt gain REACTION: wt gain  . Rofecoxib Nausea And Vomiting  . Statins Nausea And Vomiting    Body aches  . Statins     Body aches  . Tramadol Nausea Only  . Ultram [Tramadol Hcl] Nausea And Vomiting  . Celebrex [Celecoxib] Swelling and Rash  . Contrast Media [Iodinated Diagnostic Agents] Hives    Patient has history of hives from Ionic contrast injection in 1980's. No allergy to Non-Ionic contrast. 05-09-2014 rsm   Current Outpatient Prescriptions on File Prior to Visit  Medication Sig Dispense Refill  . albuterol (PROVENTIL HFA;VENTOLIN HFA) 108 (90 BASE) MCG/ACT inhaler Inhale 2 puffs into the lungs every 6 (six) hours as needed for wheezing. For wheezing 3.7 g 11  . aspirin EC 81 MG EC tablet Take 1 tablet (81 mg total) by mouth daily. 30 tablet 0  . Calcium Carbonate-Vitamin D (CALCIUM + D PO) Take 2 tablets by mouth at bedtime.     . dicyclomine (BENTYL) 20 MG tablet Take 1 tablet (20 mg total) by mouth 2 (two) times daily. 60 tablet 1  . Estradiol Acetate (FEMRING) 0.05 MG/24HR RING Place 1 each vaginally every 3 (three) months. 1 each 4  . fentaNYL (DURAGESIC - DOSED MCG/HR) 25 MCG/HR Place 1 patch onto the skin every 3 (three) days.    . fluticasone (FLONASE) 50 MCG/ACT nasal spray Place 2 sprays into both nostrils as needed. (Patient taking differently: Place 2 sprays into both nostrils as needed for allergies or rhinitis. ) 16 g 11  . Fluticasone-Salmeterol (ADVAIR DISKUS) 250-50 MCG/DOSE AEPB Inhale 1 puff into the lungs 2 (two) times daily. 1 each 11  . hydrochlorothiazide (HYDRODIURIL) 25 MG tablet Take 1 tablet (25 mg total) by mouth daily as needed (for edema). 90 tablet 3  . HYDROcodone-acetaminophen (NORCO) 5-325 MG tablet Take 1 tablet by mouth every 4 (four) hours as needed. 10 tablet 0  . ibuprofen (ADVIL,MOTRIN) 800 MG tablet Take 1 tablet (800 mg total) by mouth every 8 (eight) hours as needed. 60 tablet 3  .  lansoprazole (PREVACID) 30 MG capsule Take 1 capsule (30 mg total) by mouth 2 (two) times daily before a meal. 60 capsule 11  . lidocaine (LIDODERM) 5 % Place 1 patch onto the skin as needed (pain). 30 patch 0  . meloxicam (MOBIC) 15 MG tablet Take 1 tablet (15 mg total) by mouth daily. 30 tablet 6  . naproxen (NAPROSYN) 500 MG tablet Take 1 tablet (500 mg total) by mouth daily as needed (migraines).   30 tablet 0  . NONFORMULARY OR COMPOUNDED ITEM Boric Acid suppositories 600 mg 30 each 2  . nortriptyline (PAMELOR) 25 MG capsule Take 2 capsules (50 mg total) by mouth at bedtime. 60 capsule 11  . Polyethyl Glycol-Propyl Glycol (SYSTANE) 0.4-0.3 % GEL ophthalmic gel Place 1 application into both eyes daily. 10 mL 12  . polyethylene glycol powder (MIRALAX) powder Take 17 g by mouth as needed. For constipation 255 g 0  . rizatriptan (MAXALT-MLT) 5 MG disintegrating tablet Take 1 tablet (5 mg total) by mouth as needed. May repeat in 2 hours if needed 15 tablet 6  . thyroid (ARMOUR) 30 MG tablet Take 1 tablet (30 mg total) by mouth daily before breakfast. 90 tablet 3  . topiramate (TOPAMAX) 50 MG tablet Take 1 tablet (50 mg total) by mouth 2 (two) times daily. (Patient taking differently: Take 50 mg by mouth daily. ) 60 tablet 11   No current facility-administered medications on file prior to visit.    Review of Systems  Constitutional: Negative for other unusual diaphoresis or sweats HENT: Negative for ear discharge or swelling Eyes: Negative for other worsening visual disturbances Respiratory: Negative for stridor or other swelling  Gastrointestinal: Negative for worsening distension or other blood Genitourinary: Negative for retention or other urinary change Musculoskeletal: Negative for other MSK pain or swelling Skin: Negative for color change or other new lesions Neurological: Negative for worsening tremors and other numbness  Psychiatric/Behavioral: Negative for worsening agitation or other  fatigue All other system neg per pt    Objective:   Physical Exam BP 130/84   Pulse 82   Temp 98.2 F (36.8 C) (Oral)   Ht 5' 4" (1.626 m)   Wt 145 lb (65.8 kg)   SpO2 97%   BMI 24.89 kg/m  VS noted,  Constitutional: Pt appears in NAD HENT: Head: NCAT.  Right Ear: External ear normal.  Left Ear: External ear normal.  left ears canal clear after irrigation of wax impaction Eyes: . Pupils are equal, round, and reactive to light. Conjunctivae and EOM are normal Nose: without d/c or deformity Neck: Neck supple. Gross normal ROM Cardiovascular: Normal rate and regular rhythm.   Pulmonary/Chest: Effort normal and breath sounds without rales or wheezing.  Abd:  Soft,  ND, + BS, no organomegaly, low mid abd tender Right prox leg with scar intact with mild tender but no swelling or fluctuance Neurological: Pt is alert. At baseline orientation, motor grossly intact Skin: Skin is warm. No rashes, other new lesions, no LE edema Psychiatric: Pt behavior is normal without agitation  No other exam findings    Assessment & Plan:   

## 2016-11-09 NOTE — Telephone Encounter (Signed)
Pt called in and said that she seen a dr in [facility].  Ortho Dr.  The office called her this morning and told her that she needed to be seen with primary asap. Abnormal white blood cells.  She would like to know if she could be worked in today.  She said [birth year] is her birthday and next week Dr Jenny Reichmann is gone.  She said she would rather not come on her bday.     Best number [phone]

## 2016-11-09 NOTE — Telephone Encounter (Signed)
Double book her at 1:30.

## 2016-11-10 ENCOUNTER — Telehealth: Payer: Self-pay | Admitting: Internal Medicine

## 2016-11-10 LAB — URINE CULTURE
MICRO NUMBER: 81165352
SPECIMEN QUALITY: ADEQUATE

## 2016-11-10 NOTE — Telephone Encounter (Signed)
Pt was give her test results from yesterday and understood and was told the culture has not come back yet and she will get a call when it is in.

## 2016-11-11 NOTE — Assessment & Plan Note (Signed)
stable overall by history and exam, and pt to continue medical treatment as before,  to f/u any worsening symptoms or concerns 

## 2016-11-11 NOTE — Assessment & Plan Note (Addendum)
C/w possible UTI, for urine studies, labs as ordered and augmentin asd,  to f/u any worsening symptoms or concerns  Note:  Total time for pt hx, exam, review of record with pt in the room, determination of diagnoses and plan for further eval and tx is > 40 min, with over 50% spent in coordination and counseling of patient, including the differential dx, tx, further evaluation, and other management or abd pain, right leg pain, copd and hearing loss.

## 2016-11-11 NOTE — Assessment & Plan Note (Signed)
Resolved with irrigation,  to f/u any worsening symptoms or concerns 

## 2016-11-11 NOTE — Assessment & Plan Note (Signed)
Etiology unclear, exam without worsening tender or swelling and afeb, will hold on imaging for now,  to f/u any worsening symptoms or concerns

## 2016-11-13 NOTE — Telephone Encounter (Signed)
Dr. Sharlet Salina please advise in Morgan absence. Thanks!

## 2016-11-13 NOTE — Telephone Encounter (Signed)
Patient is requesting results of her urine culture.  Is requesting call back as soon as possible.  If there is not a response from Dr. Jenny Reichmann, pt would like to know if another MD can read the culture.

## 2016-11-13 NOTE — Telephone Encounter (Signed)
Urine culture just shows normal skin bacteria and does not require change in treatment.

## 2016-11-13 NOTE — Telephone Encounter (Signed)
Patient notified as instructed, verbalized understanding.

## 2016-12-26 ENCOUNTER — Telehealth: Payer: Self-pay

## 2016-12-26 ENCOUNTER — Other Ambulatory Visit (INDEPENDENT_AMBULATORY_CARE_PROVIDER_SITE_OTHER): Payer: Medicare Other

## 2016-12-26 DIAGNOSIS — M25551 Pain in right hip: Secondary | ICD-10-CM

## 2016-12-26 LAB — CBC WITH DIFFERENTIAL/PLATELET
BASOS ABS: 0 10*3/uL (ref 0.0–0.1)
Basophils Relative: 0.4 % (ref 0.0–3.0)
Eosinophils Absolute: 0.1 10*3/uL (ref 0.0–0.7)
Eosinophils Relative: 1.4 % (ref 0.0–5.0)
HCT: 48.1 % — ABNORMAL HIGH (ref 36.0–46.0)
Hemoglobin: 15.5 g/dL — ABNORMAL HIGH (ref 12.0–15.0)
LYMPHS ABS: 2.2 10*3/uL (ref 0.7–4.0)
Lymphocytes Relative: 27.9 % (ref 12.0–46.0)
MCHC: 32.1 g/dL (ref 30.0–36.0)
MCV: 89.9 fl (ref 78.0–100.0)
MONOS PCT: 7.1 % (ref 3.0–12.0)
Monocytes Absolute: 0.6 10*3/uL (ref 0.1–1.0)
NEUTROS ABS: 5.1 10*3/uL (ref 1.4–7.7)
NEUTROS PCT: 63.2 % (ref 43.0–77.0)
PLATELETS: 368 10*3/uL (ref 150.0–400.0)
RBC: 5.35 Mil/uL — ABNORMAL HIGH (ref 3.87–5.11)
RDW: 14.2 % (ref 11.5–15.5)
WBC: 8 10*3/uL (ref 4.0–10.5)

## 2016-12-26 LAB — C-REACTIVE PROTEIN: CRP: 1.5 mg/dL (ref 0.5–20.0)

## 2016-12-26 LAB — SEDIMENTATION RATE: Sed Rate: 26 mm/hr (ref 0–30)

## 2016-12-26 NOTE — Telephone Encounter (Signed)
Ortho labs ordered

## 2016-12-27 ENCOUNTER — Telehealth: Payer: Self-pay

## 2016-12-27 NOTE — Telephone Encounter (Signed)
error 

## 2017-01-09 ENCOUNTER — Other Ambulatory Visit: Payer: Self-pay | Admitting: Orthopedic Surgery

## 2017-01-09 ENCOUNTER — Other Ambulatory Visit: Payer: Self-pay | Admitting: Chiropractor

## 2017-01-09 DIAGNOSIS — M25551 Pain in right hip: Secondary | ICD-10-CM

## 2017-01-14 ENCOUNTER — Ambulatory Visit
Admission: RE | Admit: 2017-01-14 | Discharge: 2017-01-14 | Disposition: A | Discharge status: Home / Self Care | Payer: Medicare Other | Source: Ambulatory Visit | Attending: Orthopedic Surgery | Admitting: Orthopedic Surgery

## 2017-01-14 DIAGNOSIS — M25551 Pain in right hip: Secondary | ICD-10-CM

## 2017-01-14 MED ORDER — GADOBENATE DIMEGLUMINE 529 MG/ML IV SOLN
20.0000 mL | Freq: Once | INTRAVENOUS | Status: AC | PRN
  Administered 2017-01-14: 20 mL via INTRAVENOUS

## 2017-02-22 ENCOUNTER — Other Ambulatory Visit: Payer: Self-pay | Admitting: Internal Medicine

## 2017-02-28 ENCOUNTER — Telehealth: Payer: Self-pay | Admitting: *Deleted

## 2017-02-28 NOTE — Telephone Encounter (Signed)
Pt called requesting Rx for HRT, states she had hip replacement in Sept 2018, annual now scheduled on 04/18/17. I called pt and left on her voicemail that she will need to come in for annual visit to get. Rx.

## 2017-04-18 ENCOUNTER — Encounter: Payer: Self-pay | Admitting: Gynecology

## 2017-04-18 ENCOUNTER — Ambulatory Visit (INDEPENDENT_AMBULATORY_CARE_PROVIDER_SITE_OTHER): Payer: Medicare Other | Admitting: Gynecology

## 2017-04-18 VITALS — BP 130/84 | Ht 64.5 in | Wt 221.0 lb

## 2017-04-18 DIAGNOSIS — Z01411 Encounter for gynecological examination (general) (routine) with abnormal findings: Secondary | ICD-10-CM | POA: Diagnosis not present

## 2017-04-18 DIAGNOSIS — R32 Unspecified urinary incontinence: Secondary | ICD-10-CM

## 2017-04-18 DIAGNOSIS — N951 Menopausal and female climacteric states: Secondary | ICD-10-CM

## 2017-04-18 DIAGNOSIS — N952 Postmenopausal atrophic vaginitis: Secondary | ICD-10-CM

## 2017-04-18 DIAGNOSIS — Z79890 Hormone replacement therapy (postmenopausal): Secondary | ICD-10-CM

## 2017-04-18 DIAGNOSIS — N898 Other specified noninflammatory disorders of vagina: Secondary | ICD-10-CM

## 2017-04-18 LAB — WET PREP FOR TRICH, YEAST, CLUE

## 2017-04-18 MED ORDER — FLUCONAZOLE 150 MG PO TABS: 150.0000 mg | ORAL_TABLET | Freq: Once | ORAL | 0 refills | Status: AC

## 2017-04-18 MED ORDER — ESTRADIOL ACETATE 0.1 MG/24HR VA RING: 1.0000 | VAGINAL_RING | VAGINAL | 4 refills | Status: DC

## 2017-04-18 NOTE — Addendum Note (Signed)
Addended by: Nelva Nay on: 04/18/2017 03:47 PM   Modules accepted: Orders

## 2017-04-18 NOTE — Patient Instructions (Signed)
Take the one Diflucan pill to treat the yeast infection.  Start back on the boric acid vaginal suppositories as you did before to see if this does not help decrease recurrence of vaginal symptoms.  Start back on the Femring every 3 months.  Follow-up if your menopausal symptoms continue.

## 2017-04-18 NOTE — Progress Notes (Signed)
[name] 1950/07/09 [id number]        67 y.o.  [id number] for annual gynecologic exam.  Patient notes a heavier white discharge.  History of recurrent yeast infections in the past.  Denies significant itching and irritation.  No urinary symptoms such as dysuria urgency low back pain fever or chills.  Had recent hip replacement.  Had been on Femring but this was discontinued.  Notes increased hot flushes and sweats wants to discuss restarting her estrogen.  Also has been having some bladder issues following surgery and was recently started on Myrbetriq.  Past medical history,surgical history, problem list, medications, allergies, family history and social history were all reviewed and documented as reviewed in the EPIC chart.  ROS:  Performed with pertinent positives and negatives included in the history, assessment and plan.   Additional significant findings : None   Exam: [name] assistant Vitals:   04/18/17 1443  BP: 130/84  Weight: 221 lb (100.2 kg)  Height: 5' 4.5" (1.638 m)   Body mass index is 37.35 kg/m.  General appearance:  Normal affect, orientation and appearance. Skin: Grossly normal HEENT: Without gross lesions.  No cervical or supraclavicular adenopathy. Thyroid normal.  Lungs:  Clear without wheezing, rales or rhonchi Cardiac: RR, without RMG Abdominal:  Soft, nontender, without masses, guarding, rebound, organomegaly or hernia Breasts:  Examined lying and sitting without masses, retractions, discharge or axillary adenopathy. Pelvic:  Ext, BUS, Vagina: With atrophic changes.  Thick white discharge noted.  Adnexa: Without masses or tenderness    Anus and perineum: Normal   Rectovaginal: Normal sphincter tone without palpated masses or tenderness.    Assessment/Plan:  67 y.o. [id number] female for annual gynecologic exam status post hysterectomy with subsequent LSO and RSO as separate procedures for endometriosis the last being in 2002..    1. Postmenopausal/atrophic genital changes/HRT.  Patient had been on Femring 0.1 mg but discontinued and now is having hot flushes and sweats.  We discussed the issues of HRT, risks versus benefits.  Benefits of trans-dermal/vaginal absorption versus oral, first-pass effect also reviewed.  Risks to include thrombosis such as stroke heart attack DVT reviewed as well as the breast cancer issue.  Benefits as far as symptom relief and possible cardiovascular and bone health also discussed. 2. White discharge.  Wet prep was negative although clinically very suspicious for yeast.  Will cover with Diflucan 150 mg x1 dose.  Patient had used boric acid suppositories in the past to suppress recurrences and did well with these.  We will go ahead and reinitiate them boric acid suppositories 600 mg intravaginal twice weekly #30 with 4 refills to [facility] 3. History of urinary incontinence.  After her hip surgery she sounds as if she had urinary retention because they had to drain over thousand cc from her bladder and then subsequently 500 cc by her history.  Since then she has been having issues with incontinence.  Recently started on Myrbetriq.  I reviewed with her that stopping the Femring may also contribute to bladder symptoms and that reinitiating may help with the symptoms.  We will check baseline urine analysis today.  We will see how she does after starting Femring.  Offered urology referral if symptoms continued. 4. History of osteopenia on DEXA 2012 with T score -1.3.  Follow-up DEXA 2015 was normal.  Will plan DEXA next year at age 89. 89. Mammography 08/2016.  Continue with annual mammography when due.  Breast exam normal today. 6. Colonoscopy  2013.  Repeat at their recommended interval. 7. Pap smear 2017.  No Pap smear done today.  History of dysplasia per Dr. Valeta Harms note previously.  Options to stop screening per current screening guidelines and hysterectomy history versus less frequent  screening intervals with repeating her Pap smear next year at 3-year interval discussed.  Will readdress next year. 8. Health maintenance.  No routine lab work done as patient does this elsewhere.  Follow-up 1 year, sooner as needed.  Additional time in excess of her routine gynecologic exam was spent in direct face to face counseling and coordination of care in regards to her hormone replacement reinitiation and vaginal discharge management.   Anastasio Auerbach MD, 3:17 PM 04/18/2017

## 2017-04-19 ENCOUNTER — Telehealth: Payer: Self-pay | Admitting: *Deleted

## 2017-04-19 LAB — URINALYSIS, COMPLETE W/RFL CULTURE
Bacteria, UA: NONE SEEN /HPF
Bilirubin Urine: NEGATIVE
GLUCOSE, UA: NEGATIVE
Hgb urine dipstick: NEGATIVE
Hyaline Cast: NONE SEEN /LPF
Ketones, ur: NEGATIVE
Leukocyte Esterase: NEGATIVE
NITRITES URINE, INITIAL: NEGATIVE
PH: 5 (ref 5.0–8.0)
Protein, ur: NEGATIVE
RBC / HPF: NONE SEEN /HPF (ref 0–2)
SPECIFIC GRAVITY, URINE: 1.02 (ref 1.001–1.03)
WBC, UA: NONE SEEN /HPF (ref 0–5)

## 2017-04-19 LAB — NO CULTURE INDICATED

## 2017-04-19 MED ORDER — NONFORMULARY OR COMPOUNDED ITEM: 4 refills | Status: DC

## 2017-04-19 NOTE — Telephone Encounter (Signed)
-----   Message from Anastasio Auerbach, MD sent at 04/18/2017  3:44 PM EDT ----- Boric acid vaginal suppositories 600 mg #30 one per vagina twice weekly with 4 refills to [facility]

## 2017-04-19 NOTE — Telephone Encounter (Signed)
Rx called in 

## 2017-05-17 ENCOUNTER — Ambulatory Visit: Payer: Medicare Other | Admitting: Internal Medicine

## 2017-05-17 ENCOUNTER — Encounter: Payer: Self-pay | Admitting: Internal Medicine

## 2017-05-17 ENCOUNTER — Other Ambulatory Visit (INDEPENDENT_AMBULATORY_CARE_PROVIDER_SITE_OTHER): Payer: Medicare Other

## 2017-05-17 VITALS — BP 126/84 | HR 94 | Temp 98.6°F | Ht 64.5 in | Wt 223.0 lb

## 2017-05-17 DIAGNOSIS — Z114 Encounter for screening for human immunodeficiency virus [HIV]: Secondary | ICD-10-CM

## 2017-05-17 DIAGNOSIS — Z0001 Encounter for general adult medical examination with abnormal findings: Secondary | ICD-10-CM

## 2017-05-17 DIAGNOSIS — R197 Diarrhea, unspecified: Secondary | ICD-10-CM

## 2017-05-17 DIAGNOSIS — M5416 Radiculopathy, lumbar region: Secondary | ICD-10-CM

## 2017-05-17 DIAGNOSIS — T148XXA Other injury of unspecified body region, initial encounter: Secondary | ICD-10-CM

## 2017-05-17 DIAGNOSIS — R1032 Left lower quadrant pain: Secondary | ICD-10-CM

## 2017-05-17 DIAGNOSIS — R1013 Epigastric pain: Secondary | ICD-10-CM | POA: Diagnosis not present

## 2017-05-17 LAB — H. PYLORI ANTIBODY, IGG: H Pylori IgG: NEGATIVE

## 2017-05-17 LAB — BASIC METABOLIC PANEL
BUN: 15 mg/dL (ref 6–23)
CALCIUM: 9.4 mg/dL (ref 8.4–10.5)
CO2: 27 mEq/L (ref 19–32)
Chloride: 105 mEq/L (ref 96–112)
Creatinine, Ser: 0.97 mg/dL (ref 0.40–1.20)
GFR: 74.23 mL/min (ref >60.00)
GLUCOSE: 93 mg/dL (ref 70–99)
POTASSIUM: 4 meq/L (ref 3.5–5.1)
SODIUM: 140 meq/L (ref 135–145)

## 2017-05-17 LAB — CBC WITH DIFFERENTIAL/PLATELET
Basophils Absolute: 0.1 10*3/uL (ref 0.0–0.1)
Basophils Relative: 0.8 % (ref 0.0–3.0)
EOS PCT: 0.7 % (ref 0.0–5.0)
Eosinophils Absolute: 0 10*3/uL (ref 0.0–0.7)
HCT: 48.4 % — ABNORMAL HIGH (ref 36.0–46.0)
Hemoglobin: 16 g/dL — ABNORMAL HIGH (ref 12.0–15.0)
Lymphocytes Relative: 27.1 % (ref 12.0–46.0)
Lymphs Abs: 1.9 10*3/uL (ref 0.7–4.0)
MCHC: 33 g/dL (ref 30.0–36.0)
MCV: 86.8 fl (ref 78.0–100.0)
Monocytes Absolute: 0.4 10*3/uL (ref 0.1–1.0)
Monocytes Relative: 5.2 % (ref 3.0–12.0)
NEUTROS ABS: 4.7 10*3/uL (ref 1.4–7.7)
Neutrophils Relative %: 66.2 % (ref 43.0–77.0)
Platelets: 327 10*3/uL (ref 150.0–400.0)
RBC: 5.58 Mil/uL — AB (ref 3.87–5.11)
RDW: 15 % (ref 11.5–15.5)
WBC: 7 10*3/uL (ref 4.0–10.5)

## 2017-05-17 LAB — URINALYSIS, ROUTINE W REFLEX MICROSCOPIC
Bilirubin Urine: NEGATIVE
Hgb urine dipstick: NEGATIVE
KETONES UR: NEGATIVE
LEUKOCYTES UA: NEGATIVE
Nitrite: NEGATIVE
PH: 5.5 (ref 5.0–8.0)
RBC / HPF: NONE SEEN (ref 0–?)
Specific Gravity, Urine: 1.025 (ref 1.000–1.030)
TOTAL PROTEIN, URINE-UPE24: NEGATIVE
URINE GLUCOSE: NEGATIVE
UROBILINOGEN UA: 0.2 (ref 0.0–1.0)

## 2017-05-17 LAB — TSH: TSH: 1.23 u[IU]/mL (ref 0.35–4.50)

## 2017-05-17 LAB — LIPID PANEL
CHOL/HDL RATIO: 4
Cholesterol: 225 mg/dL — ABNORMAL HIGH (ref 0–200)
HDL: 63.5 mg/dL (ref >39.00)
LDL CALC: 148 mg/dL — AB (ref 0–99)
NONHDL: 161.62
TRIGLYCERIDES: 70 mg/dL (ref 0.0–149.0)
VLDL: 14 mg/dL (ref 0.0–40.0)

## 2017-05-17 LAB — LIPASE: LIPASE: 19 U/L (ref 11.0–59.0)

## 2017-05-17 LAB — HEPATIC FUNCTION PANEL
ALT: 9 U/L (ref 0–35)
AST: 14 U/L (ref 0–37)
Albumin: 3.8 g/dL (ref 3.5–5.2)
Alkaline Phosphatase: 64 U/L (ref 39–117)
BILIRUBIN TOTAL: 0.5 mg/dL (ref 0.2–1.2)
Bilirubin, Direct: 0.1 mg/dL (ref 0.0–0.3)
Total Protein: 7.6 g/dL (ref 6.0–8.3)

## 2017-05-17 MED ORDER — DICYCLOMINE HCL 20 MG PO TABS: 20.0000 mg | ORAL_TABLET | Freq: Two times a day (BID) | ORAL | 1 refills | Status: DC

## 2017-05-17 MED ORDER — SUCRALFATE 1 G PO TABS: 1.0000 g | ORAL_TABLET | Freq: Three times a day (TID) | ORAL | 0 refills | Status: DC

## 2017-05-17 MED ORDER — GABAPENTIN 100 MG PO CAPS: 100.0000 mg | ORAL_CAPSULE | Freq: Three times a day (TID) | ORAL | 3 refills | Status: DC

## 2017-05-17 NOTE — Patient Instructions (Addendum)
Please take all new medication as prescribed - the gabapentin at 100 mg three times per day  (call in 1 week if you want to try 200 mg)  Please take all new medication as prescribed - the carafate for a short time  Please continue all other medications as before, including the lansoprazole, and refills have been done if requested - the bentyl  Please have the pharmacy call with any other refills you may need.  Please continue your efforts at being more active, low cholesterol diet, and weight control.  You are otherwise up to date with prevention measures today.  Please keep your appointments with your specialists as you may have planned - orthopedic regarding the persistent hematoma   You will be contacted regarding the referral for: MRI for the lower back  Please go to the LAB in the Basement (turn left off the elevator) for the tests to be done today  You will be contacted by phone if any changes need to be made immediately.  Otherwise, you will receive a letter about your results with an explanation, but please check with MyChart first.  Please remember to sign up for MyChart if you have not done so, as this will be important to you in the future with finding out test results, communicating by private email, and scheduling acute appointments online when needed.  Please return in 6 months, or sooner if needed

## 2017-05-17 NOTE — Assessment & Plan Note (Signed)
C/w IBS , for refill Bentyl

## 2017-05-17 NOTE — Assessment & Plan Note (Signed)
Suspect rucurrent flare of gastritis/esophagitis as per last 2016 EGD; to cont PPI, add carafate qid for 1 mo, consider GI referral for possible repeat EGD

## 2017-05-17 NOTE — Progress Notes (Signed)
Subjective:    Patient ID: [name], female    DOB: 03-14-1950, 67 y.o.   MRN: [id number]  HPI  Here for wellness and f/u;  Overall doing ok;  Pt denies Chest pain, worsening SOB, DOE, wheezing, orthopnea, PND, worsening LE edema, palpitations, dizziness or syncope.  Pt denies neurological change such as new headache, facial or extremity weakness.  Pt denies polydipsia, polyuria, or low sugar symptoms. Pt states overall good compliance with treatment and medications, good tolerability, and has been trying to follow appropriate diet.  Pt denies worsening depressive symptoms, suicidal ideation or panic. No fever, night sweats, wt loss, loss of appetite, or other constitutional symptoms.  Pt states good ability with ADL's, has low fall risk, home safety reviewed and adequate, no other significant changes in hearing or vision, and not active with regular excercise Also -  Here to c/o abd cramping pain that occasionally is "quite rough", diffuse but maybe somewhat worse to umbilical area with a burning, has known umbilical hernia, pain radiates to the back, worse with eating, seems different from IBS and has difficulty with having to have stool even before gets home.  Believes she is gluten and lactose intolerant.   Appetite ok, no wt loss, but the oct wt is probably incorrect, had lost from about 245 she thinks (not 145).  Has some bloating and distension.  Wt Readings from Last 3 Encounters:  05/17/17 223 lb (101.2 kg)  04/18/17 221 lb (100.2 kg)  11/09/16 145 lb (65.8 kg)  EGD 2016 with Dr Fuller Plan c/w esophagitis and gastritis,  Seems to get "choked a lot"   Last colonoscopy 2013 with diverticulosis, and f/u cologuard neg per pt in 2018.  Denies worsening reflux, dysphagia, n/v, or blood, though does have very dark stools.  Did also have episode significant nausea post bath and felt lightheaded.    Also has chronic persistent right low back pain, but no worsening LE numbness/weakness but has pain  that radiates to below the right knee, no gait change or falls. No prior hx of evaluation, except pt does recall saw a PA for ortho with plain films that apparently  Has taken gabapentin but not a big fan of side effects, and lyrica years ago caused wt gain.    Did have right lateral hip pain with hx of shingles about 3-4 yrs ago, also recently right THR which did well except with hematoma that may not be completely resolved yet, overall very sore and sensitive.   Due for DXA? Taking vit D and calcium, last DXA normal per GYN 2015. Past Medical History:  Diagnosis Date  . Anxiety   . Arthritis   . Asthma   . Asthma   . Biliary dyskinesia   . Bowel obstruction ([place])   . BRCA negative 02/2013  . Cervical dysplasia   . Complication of anesthesia    "I have the shakes"  . Depression   . Diverticulosis   . Elevated cholesterol   . Endometriosis   . Fibromyalgia   . GERD (gastroesophageal reflux disease)   . Heart murmur    mvp  . Hx of oral aphthous ulcers   . Hypertension   . Hypertension    no med  . Hypothyroidism    hx nodule on thyroid   . Impaired glucose tolerance 02/25/2011  . Low back pain   . Migraine   . Migraines   . Nocturia   . Polycythemia vera ([place])   . Shingles   .  Shortness of breath    with exertion  . Sickle cell trait ([place])   . Sleep apnea    has not used c-pap x 1 yr. use mouthguard now  . Thyroid disease   . Thyroid disease    Hypothyroid   Past Surgical History:  Procedure Laterality Date  . ABDOMINAL HYSTERECTOMY    . BREAST EXCISIONAL BIOPSY Left 1999  . BREAST SURGERY     Cystectomy on Left breast  . BREAST SURGERY     Benign breast cyst  . CHOLECYSTECTOMY    . CHOLECYSTECTOMY  12/05/2011   Procedure: LAPAROSCOPIC CHOLECYSTECTOMY;  Surgeon: Ralene Ok, MD;  Location: [facility];  Service: General;  Laterality: N/A;  . COLPOSCOPY    . GYNECOLOGIC CRYOSURGERY    . HERNIA REPAIR     abdomen  . OOPHORECTOMY     BSO  . PANNICULECTOMY N/A  06/12/2014   Procedure: PANNICULECTOMY;  Surgeon: Irene Limbo, MD;  Location: [facility];  Service: Plastics;  Laterality: N/A;  . PELVIC LAPAROSCOPY  1995,2002   DL LSO DL RSO  . TONSILLECTOMY    . TOTAL HIP ARTHROPLASTY     Right  . VAGINAL HYSTERECTOMY  1983    reports that she has never smoked. She has never used smokeless tobacco. She reports that she drinks alcohol. She reports that she does not use drugs. family history includes Arthritis in her father; Bone cancer in her mother; Breast cancer in her maternal aunt, maternal aunt, maternal grandmother, mother, and paternal grandmother; Breast cancer (age of onset: 76) in her sister; Colon cancer in her sister; Colon cancer (age of onset: 85) in her sister; Diabetes in her father; Heart disease in her father and mother; Heart failure in her father; Hypertension in her father and mother; Multiple sclerosis in her cousin; Ovarian cancer in her paternal grandmother; Sarcoidosis in her brother, sister, and sister. Allergies  Allergen Reactions  . Celecoxib Nausea Only and Nausea And Vomiting  . Codeine Nausea Only    REACTION: Nausea  . Codeine Nausea And Vomiting  . Duloxetine Nausea And Vomiting    REACTION: n/v REACTION: n/v  . Erythromycin     REACTION: Itching  . Escitalopram Oxalate Nausea Only  . Ezetimibe Nausea And Vomiting  . Gluten Meal     Intolerant to gluten  . Lactose Intolerance ([place])   . Lyrica [Pregabalin] Nausea And Vomiting  . Milnacipran Nausea And Vomiting    REACTION: n/v REACTION: n/v  . Pregabalin Itching and Nausea And Vomiting    REACTION: wt gain REACTION: wt gain  . Rofecoxib Nausea And Vomiting  . Statins Nausea And Vomiting    Body aches  . Statins     Body aches  . Tramadol Nausea Only  . Ultram [Tramadol Hcl] Nausea And Vomiting  . Celebrex [Celecoxib] Swelling and Rash  . Contrast Media [Iodinated Diagnostic Agents] Hives    Patient has history of hives from Ionic  contrast injection in 1980's. No allergy to Non-Ionic contrast. 05-09-2014 rsm   Current Outpatient Medications on File Prior to Visit  Medication Sig Dispense Refill  . albuterol (PROVENTIL HFA;VENTOLIN HFA) 108 (90 BASE) MCG/ACT inhaler Inhale 2 puffs into the lungs every 6 (six) hours as needed for wheezing. For wheezing 3.7 g 11  . aspirin EC 81 MG EC tablet Take 1 tablet (81 mg total) by mouth daily. 30 tablet 0  . Calcium Carbonate-Vitamin D (CALCIUM + D PO) Take 2 tablets by mouth at bedtime.     Marland Kitchen  Estradiol Acetate 0.1 MG/24HR RING Place 1 each vaginally every 3 (three) months. 1 each 4  . fentaNYL (DURAGESIC - DOSED MCG/HR) 25 MCG/HR Place 1 patch onto the skin every 3 (three) days.    . fluticasone (FLONASE) 50 MCG/ACT nasal spray Place 2 sprays into both nostrils as needed. (Patient taking differently: Place 2 sprays into both nostrils as needed for allergies or rhinitis. ) 16 g 11  . hydrochlorothiazide (HYDRODIURIL) 25 MG tablet Take 1 tablet (25 mg total) by mouth daily as needed (for edema). 90 tablet 3  . HYDROcodone-acetaminophen (NORCO) 5-325 MG tablet Take 1 tablet by mouth every 4 (four) hours as needed. 10 tablet 0  . ibuprofen (ADVIL,MOTRIN) 800 MG tablet Take 1 tablet (800 mg total) by mouth every 8 (eight) hours as needed. 60 tablet 3  . lansoprazole (PREVACID) 30 MG capsule Take 1 capsule (30 mg total) by mouth 2 (two) times daily before a meal. 60 capsule 11  . lidocaine (LIDODERM) 5 % Place 1 patch onto the skin as needed (pain). 30 patch 0  . meloxicam (MOBIC) 15 MG tablet Take 1 tablet (15 mg total) by mouth daily. 30 tablet 6  . Mirabegron (MYRBETRIQ PO) Take by mouth.    . naproxen (NAPROSYN) 500 MG tablet Take 1 tablet (500 mg total) by mouth daily as needed (migraines). 30 tablet 0  . NONFORMULARY OR COMPOUNDED ITEM Boric Acid suppositories 600 mg 30 each 4  . nortriptyline (PAMELOR) 25 MG capsule Take 2 capsules (50 mg total) by mouth at bedtime. 60 capsule 11  .  Polyethyl Glycol-Propyl Glycol (SYSTANE) 0.4-0.3 % GEL ophthalmic gel Place 1 application into both eyes daily. 10 mL 12  . polyethylene glycol powder (MIRALAX) powder Take 17 g by mouth as needed. For constipation 255 g 0  . thyroid (ARMOUR) 30 MG tablet Take 1 tablet (30 mg total) by mouth daily before breakfast. 90 tablet 3  . topiramate (TOPAMAX) 50 MG tablet TAKE 1 TABLET(50 MG) BY MOUTH TWICE DAILY 60 tablet 0   No current facility-administered medications on file prior to visit.    Review of Systems Constitutional: Negative for other unusual diaphoresis, sweats, appetite or weight changes HENT: Negative for other worsening hearing loss, ear pain, facial swelling, mouth sores or neck stiffness.   Eyes: Negative for other worsening pain, redness or other visual disturbance.  Respiratory: Negative for other stridor or swelling Cardiovascular: Negative for other palpitations or other chest pain  Gastrointestinal: Negative for worsening diarrhea or loose stools, blood in stool, distention or other pain Genitourinary: Negative for hematuria, flank pain or other change in urine volume.  Musculoskeletal: Negative for myalgias or other joint swelling.  Skin: Negative for other color change, or other wound or worsening drainage.  Neurological: Negative for other syncope or numbness. Hematological: Negative for other adenopathy or swelling Psychiatric/Behavioral: Negative for hallucinations, other worsening agitation, SI, self-injury, or new decreased concentration All other system neg per pt    Objective:   Physical Exam BP 126/84   Pulse 94   Temp 98.6 F (37 C) (Oral)   Ht 5' 4.5" (1.638 m)   Wt 223 lb (101.2 kg)   SpO2 97%   BMI 37.69 kg/m  VS noted,  Constitutional: Pt is oriented to person, place, and time. Appears well-developed and well-nourished, in no significant distress and comfortable Head: Normocephalic and atraumatic  Eyes: Conjunctivae and EOM are normal. Pupils are  equal, round, and reactive to light Right Ear: External ear normal without  discharge Left Ear: External ear normal without discharge Nose: Nose without discharge or deformity Mouth/Throat: Oropharynx is without other ulcerations and moist  Neck: Normal range of motion. Neck supple. No JVD present. No tracheal deviation present or significant neck LA or mass Cardiovascular: Normal rate, regular rhythm, normal heart sounds and intact distal pulses.   Pulmonary/Chest: WOB normal and breath sounds without rales or wheezing  Abdominal: Soft. Bowel sounds are normal.. No HSM  with epigastric tender and low mid abd tender without guarding or rebound Musculoskeletal: Normal range of motion. Exhibits no edema Lymphadenopathy: Has no other cervical adenopathy.  Neurological: Pt is alert and oriented to person, place, and time. Pt has normal reflexes. No cranial nerve deficit. Motor grossly intact except for RLE 4+/5 weakness and reduces sensatino to LT to distal leg, Gait overall not unstable Skin: Skin is warm and dry. No rash noted or new ulcerations, right hip surgical site with 5 x 7 cm area subq fluid pocket, mild tender without drainage Psychiatric:  Has normal mood and affect. Behavior is normal without agitation No other exam findings Lab Results  Component Value Date   WBC 8.0 12/26/2016   HGB 15.5 (H) 12/26/2016   HCT 48.1 (H) 12/26/2016   PLT 368.0 12/26/2016   GLUCOSE 87 11/09/2016   CHOL 236 (H) 07/22/2015   TRIG 82.0 07/22/2015   HDL 61.30 07/22/2015   LDLDIRECT 194.2 05/31/2007   LDLCALC 158 (H) 07/22/2015   ALT 7 11/09/2016   AST 13 11/09/2016   NA 141 11/09/2016   K 3.3 (L) 11/09/2016   CL 106 11/09/2016   CREATININE 0.93 11/09/2016   BUN 10 11/09/2016   CO2 29 11/09/2016   TSH 2.01 09/08/2016   HGBA1C 5.1 07/22/2015       Assessment & Plan:

## 2017-05-17 NOTE — Assessment & Plan Note (Addendum)
Mild to mod, subtle but I think still present and distinct from her recent right hip surgury, hx shingles and current persisten possible local hematoma  Ok for gabapentin 100 tid, also MRI LS Spine, consider ortho referral for this as well  In addition to the time spent performing CPE, I spent an additional 25 minutes face to face,in which greater than 50% of this time was spent in counseling and coordination of care for patient's acute illness as documented, including the differential dx, treatment, further evaluation and other management of right lumbar radiculopathy, hematoma, abd pain epigastric and IBS

## 2017-05-17 NOTE — Assessment & Plan Note (Signed)

## 2017-05-17 NOTE — Assessment & Plan Note (Signed)
Has unfortunate ? Seroma/hematoma persistent at the recent right surgical site rather large and uncomfortable, encouraged pt to f/u with ortho as planned

## 2017-05-18 LAB — HIV ANTIBODY (ROUTINE TESTING W REFLEX): HIV 1&2 Ab, 4th Generation: NONREACTIVE

## 2017-05-26 ENCOUNTER — Ambulatory Visit
Admission: RE | Admit: 2017-05-26 | Discharge: 2017-05-26 | Disposition: A | Discharge status: Home / Self Care | Payer: Medicare Other | Source: Ambulatory Visit | Attending: Internal Medicine | Admitting: Internal Medicine

## 2017-05-26 DIAGNOSIS — M5416 Radiculopathy, lumbar region: Secondary | ICD-10-CM

## 2017-05-28 ENCOUNTER — Encounter: Payer: Self-pay | Admitting: Internal Medicine

## 2017-07-18 ENCOUNTER — Telehealth: Payer: Self-pay | Admitting: Gastroenterology

## 2017-07-18 ENCOUNTER — Encounter: Payer: Self-pay | Admitting: Internal Medicine

## 2017-07-18 ENCOUNTER — Other Ambulatory Visit (INDEPENDENT_AMBULATORY_CARE_PROVIDER_SITE_OTHER): Payer: Medicare Other

## 2017-07-18 ENCOUNTER — Ambulatory Visit: Payer: Medicare Other | Admitting: Internal Medicine

## 2017-07-18 ENCOUNTER — Ambulatory Visit (INDEPENDENT_AMBULATORY_CARE_PROVIDER_SITE_OTHER)
Admission: RE | Admit: 2017-07-18 | Discharge: 2017-07-18 | Disposition: A | Discharge status: Home / Self Care | Payer: Medicare Other | Source: Ambulatory Visit | Attending: Internal Medicine | Admitting: Internal Medicine

## 2017-07-18 VITALS — BP 120/90 | HR 88 | Temp 98.5°F | Ht 64.5 in | Wt 227.0 lb

## 2017-07-18 DIAGNOSIS — E039 Hypothyroidism, unspecified: Secondary | ICD-10-CM

## 2017-07-18 DIAGNOSIS — R739 Hyperglycemia, unspecified: Secondary | ICD-10-CM

## 2017-07-18 DIAGNOSIS — R1031 Right lower quadrant pain: Secondary | ICD-10-CM | POA: Insufficient documentation

## 2017-07-18 DIAGNOSIS — R7303 Prediabetes: Secondary | ICD-10-CM | POA: Insufficient documentation

## 2017-07-18 DIAGNOSIS — I10 Essential (primary) hypertension: Secondary | ICD-10-CM | POA: Diagnosis not present

## 2017-07-18 LAB — URINALYSIS, ROUTINE W REFLEX MICROSCOPIC
BILIRUBIN URINE: NEGATIVE
Hgb urine dipstick: NEGATIVE
KETONES UR: NEGATIVE
LEUKOCYTES UA: NEGATIVE
Nitrite: NEGATIVE
PH: 5.5 (ref 5.0–8.0)
RBC / HPF: NONE SEEN (ref 0–?)
SPECIFIC GRAVITY, URINE: 1.025 (ref 1.000–1.030)
TOTAL PROTEIN, URINE-UPE24: NEGATIVE
URINE GLUCOSE: NEGATIVE
UROBILINOGEN UA: 0.2 (ref 0.0–1.0)

## 2017-07-18 LAB — LIPID PANEL
Cholesterol: 248 mg/dL — ABNORMAL HIGH (ref 0–200)
HDL: 62.9 mg/dL (ref >39.00)
LDL Cholesterol: 165 mg/dL — ABNORMAL HIGH (ref 0–99)
NONHDL: 184.96
Total CHOL/HDL Ratio: 4
Triglycerides: 100 mg/dL (ref 0.0–149.0)
VLDL: 20 mg/dL (ref 0.0–40.0)

## 2017-07-18 LAB — BASIC METABOLIC PANEL
BUN: 11 mg/dL (ref 6–23)
CALCIUM: 9.2 mg/dL (ref 8.4–10.5)
CO2: 34 meq/L — AB (ref 19–32)
CREATININE: 0.96 mg/dL (ref 0.40–1.20)
Chloride: 102 mEq/L (ref 96–112)
GFR: 75.09 mL/min (ref >60.00)
GLUCOSE: 103 mg/dL — AB (ref 70–99)
Potassium: 3.9 mEq/L (ref 3.5–5.1)
SODIUM: 140 meq/L (ref 135–145)

## 2017-07-18 LAB — HEPATIC FUNCTION PANEL
ALBUMIN: 3.8 g/dL (ref 3.5–5.2)
ALK PHOS: 67 U/L (ref 39–117)
ALT: 11 U/L (ref 0–35)
AST: 14 U/L (ref 0–37)
Bilirubin, Direct: 0.1 mg/dL (ref 0.0–0.3)
TOTAL PROTEIN: 7.5 g/dL (ref 6.0–8.3)
Total Bilirubin: 0.4 mg/dL (ref 0.2–1.2)

## 2017-07-18 LAB — HEMOGLOBIN A1C: Hgb A1c MFr Bld: 5.4 % (ref 4.6–6.5)

## 2017-07-18 LAB — TSH: TSH: 4.54 u[IU]/mL — AB (ref 0.35–4.50)

## 2017-07-18 LAB — CBC WITH DIFFERENTIAL/PLATELET
BASOS PCT: 0.7 % (ref 0.0–3.0)
Basophils Absolute: 0.1 10*3/uL (ref 0.0–0.1)
EOS PCT: 0.7 % (ref 0.0–5.0)
Eosinophils Absolute: 0.1 10*3/uL (ref 0.0–0.7)
HEMATOCRIT: 48.2 % — AB (ref 36.0–46.0)
HEMOGLOBIN: 16 g/dL — AB (ref 12.0–15.0)
Lymphocytes Relative: 18.8 % (ref 12.0–46.0)
Lymphs Abs: 1.9 10*3/uL (ref 0.7–4.0)
MCHC: 33.2 g/dL (ref 30.0–36.0)
MCV: 87.2 fl (ref 78.0–100.0)
MONOS PCT: 5.5 % (ref 3.0–12.0)
Monocytes Absolute: 0.6 10*3/uL (ref 0.1–1.0)
Neutro Abs: 7.4 10*3/uL (ref 1.4–7.7)
Neutrophils Relative %: 74.3 % (ref 43.0–77.0)
Platelets: 335 10*3/uL (ref 150.0–400.0)
RBC: 5.52 Mil/uL — AB (ref 3.87–5.11)
RDW: 14.5 % (ref 11.5–15.5)
WBC: 10 10*3/uL (ref 4.0–10.5)

## 2017-07-18 LAB — LIPASE: Lipase: 7 U/L — ABNORMAL LOW (ref 11.0–59.0)

## 2017-07-18 MED ORDER — OXYCODONE-ACETAMINOPHEN 5-325 MG PO TABS: 1.0000 | ORAL_TABLET | Freq: Four times a day (QID) | ORAL | 0 refills | Status: DC | PRN

## 2017-07-18 MED ORDER — THYROID 30 MG PO TABS: 30.0000 mg | ORAL_TABLET | Freq: Every day | ORAL | 3 refills | Status: DC

## 2017-07-18 MED ORDER — LIDOCAINE 5 % EX PTCH: 1.0000 | MEDICATED_PATCH | CUTANEOUS | 0 refills | Status: DC | PRN

## 2017-07-18 NOTE — Progress Notes (Signed)
Subjective:    Patient ID: [name], female    DOB: Aug 04, 1950, 67 y.o.   MRN: [id number]  HPI  Here with 1 months RLQ pain, started memorial day weekend worse, was present but minor in apr 2019 as well, with some radiation toward the back; Pt continues to have recurring LBP without change in severity, bowel or bladder change, fever, wt loss,  worsening LE pain/numbness/weakness, gait change or falls, s/p ESI right lumbar last wk but RLQ not much improved, stays bloated, has bowel freq up to 7-10 times per day "black mud" like, has some nausea on occasion but no vomiting,no fever, and appetite low but has gained wt.due to being less active and more pain.  No collard greens, no iron supplements and no pepto bismol. Has used med prn for nausea only.  Has hx of PUD in 80's, cant remember any bleeding. Only takes advil or alleve very occasionally. Wt Readings from Last 3 Encounters:  07/18/17 227 lb (103 kg)  05/17/17 223 lb (101.2 kg)  04/18/17 221 lb (100.2 kg)   BP Readings from Last 3 Encounters:  07/18/17 120/90  05/17/17 126/84  04/18/17 130/84  last colonoscopy 2013 with diverticulosis, had cologuard neg last yr.  Last EGD 0216 with Dr Fuller Plan with gastritis, esophagitis non erosive.  LST ct abd/pelvis neg for acute in 2016  Has appt with GI next Tuesday  Denies urinary symptoms such as dysuria, frequency, urgency, flank pain, hematuria or n/v, fever, chills. S/p CCX Denies hyper or hypo thyroid symptoms such as voice, skin or hair change.  Past Medical History:  Diagnosis Date  . Anxiety   . Arthritis   . Asthma   . Asthma   . Biliary dyskinesia   . Bowel obstruction ([place])   . BRCA negative 02/2013  . Cervical dysplasia   . Complication of anesthesia    "I have the shakes"  . Depression   . Diverticulosis   . Elevated cholesterol   . Endometriosis   . Fibromyalgia   . GERD (gastroesophageal reflux disease)   . Heart murmur    mvp  . Hx of oral aphthous ulcers   .  Hypertension   . Hypertension    no med  . Hypothyroidism    hx nodule on thyroid   . Impaired glucose tolerance 02/25/2011  . Low back pain   . Migraine   . Migraines   . Nocturia   . Polycythemia vera ([place])   . Shingles   . Shortness of breath    with exertion  . Sickle cell trait ([place])   . Sleep apnea    has not used c-pap x 1 yr. use mouthguard now  . Thyroid disease   . Thyroid disease    Hypothyroid   Past Surgical History:  Procedure Laterality Date  . ABDOMINAL HYSTERECTOMY    . BREAST EXCISIONAL BIOPSY Left 1999  . BREAST SURGERY     Cystectomy on Left breast  . BREAST SURGERY     Benign breast cyst  . CHOLECYSTECTOMY    . CHOLECYSTECTOMY  12/05/2011   Procedure: LAPAROSCOPIC CHOLECYSTECTOMY;  Surgeon: Ralene Ok, MD;  Location: [facility];  Service: General;  Laterality: N/A;  . COLPOSCOPY    . GYNECOLOGIC CRYOSURGERY    . HERNIA REPAIR     abdomen  . OOPHORECTOMY     BSO  . PANNICULECTOMY N/A 06/12/2014   Procedure: PANNICULECTOMY;  Surgeon: Irene Limbo, MD;  Location: [facility];  Service: Clinical cytogeneticist;  Laterality: N/A;  . PELVIC LAPAROSCOPY  1995,2002   DL LSO DL RSO  . TONSILLECTOMY    . TOTAL HIP ARTHROPLASTY     Right  . VAGINAL HYSTERECTOMY  1983    reports that she has never smoked. She has never used smokeless tobacco. She reports that she drinks alcohol. She reports that she does not use drugs. family history includes Arthritis in her father; Bone cancer in her mother; Breast cancer in her maternal aunt, maternal aunt, maternal grandmother, mother, and paternal grandmother; Breast cancer (age of onset: 39) in her sister; Colon cancer in her sister; Colon cancer (age of onset: 77) in her sister; Diabetes in her father; Heart disease in her father and mother; Heart failure in her father; Hypertension in her father and mother; Multiple sclerosis in her cousin; Ovarian cancer in her paternal grandmother; Sarcoidosis in her brother,  sister, and sister. Allergies  Allergen Reactions  . Celecoxib Nausea Only and Nausea And Vomiting  . Codeine Nausea Only    REACTION: Nausea  . Codeine Nausea And Vomiting  . Duloxetine Nausea And Vomiting    REACTION: n/v REACTION: n/v  . Erythromycin     REACTION: Itching  . Escitalopram Oxalate Nausea Only  . Ezetimibe Nausea And Vomiting  . Gluten Meal     Intolerant to gluten  . Lactose Intolerance ([place])   . Lyrica [Pregabalin] Nausea And Vomiting  . Milnacipran Nausea And Vomiting    REACTION: n/v REACTION: n/v  . Pregabalin Itching and Nausea And Vomiting    REACTION: wt gain REACTION: wt gain  . Rofecoxib Nausea And Vomiting  . Statins Nausea And Vomiting    Body aches  . Statins     Body aches  . Tramadol Nausea Only  . Ultram [Tramadol Hcl] Nausea And Vomiting  . Celebrex [Celecoxib] Swelling and Rash  . Contrast Media [Iodinated Diagnostic Agents] Hives    Patient has history of hives from Ionic contrast injection in 1980's. No allergy to Non-Ionic contrast. 05-09-2014 rsm   Current Outpatient Medications on File Prior to Visit  Medication Sig Dispense Refill  . albuterol (PROVENTIL HFA;VENTOLIN HFA) 108 (90 BASE) MCG/ACT inhaler Inhale 2 puffs into the lungs every 6 (six) hours as needed for wheezing. For wheezing 3.7 g 11  . aspirin EC 81 MG EC tablet Take 1 tablet (81 mg total) by mouth daily. 30 tablet 0  . Calcium Carbonate-Vitamin D (CALCIUM + D PO) Take 2 tablets by mouth at bedtime.     . dicyclomine (BENTYL) 20 MG tablet Take 1 tablet (20 mg total) by mouth 2 (two) times daily. 60 tablet 1  . Estradiol Acetate 0.1 MG/24HR RING Place 1 each vaginally every 3 (three) months. 1 each 4  . fentaNYL (DURAGESIC - DOSED MCG/HR) 25 MCG/HR Place 1 patch onto the skin every 3 (three) days.    . fluticasone (FLONASE) 50 MCG/ACT nasal spray Place 2 sprays into both nostrils as needed. (Patient taking differently: Place 2 sprays into both nostrils as needed for  allergies or rhinitis. ) 16 g 11  . gabapentin (NEURONTIN) 100 MG capsule Take 1 capsule (100 mg total) by mouth 3 (three) times daily. 90 capsule 3  . hydrochlorothiazide (HYDRODIURIL) 25 MG tablet Take 1 tablet (25 mg total) by mouth daily as needed (for edema). 90 tablet 3  . ibuprofen (ADVIL,MOTRIN) 800 MG tablet Take 1 tablet (800 mg total) by mouth every 8 (eight) hours as needed. 60 tablet 3  .  lansoprazole (PREVACID) 30 MG capsule Take 1 capsule (30 mg total) by mouth 2 (two) times daily before a meal. 60 capsule 11  . meloxicam (MOBIC) 15 MG tablet Take 1 tablet (15 mg total) by mouth daily. 30 tablet 6  . Mirabegron (MYRBETRIQ PO) Take by mouth.    . NONFORMULARY OR COMPOUNDED ITEM Boric Acid suppositories 600 mg 30 each 4  . nortriptyline (PAMELOR) 25 MG capsule Take 2 capsules (50 mg total) by mouth at bedtime. 60 capsule 11  . Polyethyl Glycol-Propyl Glycol (SYSTANE) 0.4-0.3 % GEL ophthalmic gel Place 1 application into both eyes daily. 10 mL 12  . polyethylene glycol powder (MIRALAX) powder Take 17 g by mouth as needed. For constipation 255 g 0  . sucralfate (CARAFATE) 1 g tablet Take 1 tablet (1 g total) by mouth 4 (four) times daily -  with meals and at bedtime. 120 tablet 0  . topiramate (TOPAMAX) 50 MG tablet TAKE 1 TABLET(50 MG) BY MOUTH TWICE DAILY 60 tablet 0   No current facility-administered medications on file prior to visit.    Review of Systems  Constitutional: Negative for other unusual diaphoresis or sweats HENT: Negative for ear discharge or swelling Eyes: Negative for other worsening visual disturbances Respiratory: Negative for stridor or other swelling  Gastrointestinal: Negative for worsening distension or other blood Genitourinary: Negative for retention or other urinary change Musculoskeletal: Negative for other MSK pain or swelling Skin: Negative for color change or other new lesions Neurological: Negative for worsening tremors and other numbness    Psychiatric/Behavioral: Negative for worsening agitation or other fatigue All other system neg per pt    Objective:   Physical Exam BP 120/90 (BP Location: Left Arm, Patient Position: Sitting, Cuff Size: Large)   Pulse 88   Temp 98.5 F (36.9 C) (Oral)   Ht 5' 4.5" (1.638 m)   Wt 227 lb (103 kg)   SpO2 96%   BMI 38.36 kg/m  VS noted, uncomfortable appearing holding right side Constitutional: Pt appears in NAD HENT: Head: NCAT.  Right Ear: External ear normal.  Left Ear: External ear normal.  Eyes: . Pupils are equal, round, and reactive to light. Conjunctivae and EOM are normal Nose: without d/c or deformity Neck: Neck supple. Gross normal ROM Cardiovascular: Normal rate and regular rhythm.   Pulmonary/Chest: Effort normal and breath sounds without rales or wheezing.  Abd:  Soft, ND, + BS, no organomegaly, with mild to mod RLQ tender without rash, guarding or rebound Neurological: Pt is alert. At baseline orientation, motor grossly intact Skin: Skin is warm. No rashes, other new lesions, no LE edema Psychiatric: Pt behavior is normal without agitation , nervous No other exam findings  Lab Results  Component Value Date   WBC 7.0 05/17/2017   HGB 16.0 (H) 05/17/2017   HCT 48.4 (H) 05/17/2017   PLT 327.0 05/17/2017   GLUCOSE 93 05/17/2017   CHOL 225 (H) 05/17/2017   TRIG 70.0 05/17/2017   HDL 63.50 05/17/2017   LDLDIRECT 194.2 05/31/2007   LDLCALC 148 (H) 05/17/2017   ALT 9 05/17/2017   AST 14 05/17/2017   NA 140 05/17/2017   K 4.0 05/17/2017   CL 105 05/17/2017   CREATININE 0.97 05/17/2017   BUN 15 05/17/2017   CO2 27 05/17/2017   TSH 1.23 05/17/2017   HGBA1C 5.1 07/22/2015       Assessment & Plan:

## 2017-07-18 NOTE — Telephone Encounter (Signed)
Patient reports right sided abdominal pain for one month.  She is also reporting multiple loose stools a day that are darker in color.  She will come in and see Tye Savoy RNP on 07/24/17 at 2:30

## 2017-07-18 NOTE — Assessment & Plan Note (Signed)
Also for a1c with labs 

## 2017-07-18 NOTE — Telephone Encounter (Signed)
Pt is requesting an appt today or tomorrow. First available with PA is on 08/06/17. Pt experiencing sharp stomach pain on her right side. Pls call her.

## 2017-07-18 NOTE — Assessment & Plan Note (Signed)
stable overall by history and exam, recent data reviewed with pt, and pt to continue medical treatment as before,  to f/u any worsening symptoms or concerns Lab Results  Component Value Date   TSH 1.23 05/17/2017

## 2017-07-18 NOTE — Assessment & Plan Note (Addendum)
Etiology unclear but doubt spinal radicular pain or other neuritic (though cant be completely ruled out), differential o/w includes constipation on chronic narcotic x 25 yrs, takes linzess about once weekly, but can r/o pancreatitis, appendicitis or colitis; pt is afeb, for now to check labs as ordered and CT abd/pelvis w cm; further tx pending results, and f/u GI next wk as planned  Note:  Total time for pt hx, exam, review of record with pt in the room, determination of diagnoses and plan for further eval and tx is > 40 min, with over 50% spent in coordination and counseling of patient including the differential dx, tx, further evaluation and other management of RLQ abd pain, HTN, hyperglycemia, hypothyroid

## 2017-07-18 NOTE — Assessment & Plan Note (Signed)
stable overall by history and exam, recent data reviewed with pt, and pt to continue medical treatment as before,  to f/u any worsening symptoms or concerns BP Readings from Last 3 Encounters:  07/18/17 120/90  05/17/17 126/84  04/18/17 130/84

## 2017-07-18 NOTE — Addendum Note (Signed)
Addended by: Cresenciano Lick on: 07/18/2017 12:11 PM   Modules accepted: Orders

## 2017-07-18 NOTE — Patient Instructions (Signed)
Please take all new medication as prescribed - the percocet as needed  Please continue all other medications as before, and refills have been done if requested.  Please have the pharmacy call with any other refills you may need.  Please continue your efforts at being more active, low cholesterol diet, and weight control.  You are otherwise up to date with prevention measures today.  Please keep your appointments with your specialists as you may have planned  You will be contacted regarding the referral for: CT scan  - to see [facility] now  Please go to the LAB in the Basement (turn left off the elevator) for the tests to be done today  You will be contacted by phone if any changes need to be made immediately.  Otherwise, you will receive a letter about your results with an explanation, but please check with MyChart first.  Please remember to sign up for MyChart if you have not done so, as this will be important to you in the future with finding out test results, communicating by private email, and scheduling acute appointments online when needed.

## 2017-07-24 ENCOUNTER — Ambulatory Visit: Payer: Medicare Other | Admitting: Nurse Practitioner

## 2017-08-11 IMAGING — NM NM GASTRIC EMPTYING
1 series · 1 of 1 positions shown · non-contrast
Comparison: None.

CLINICAL DATA: Abdominal pain bloating

EXAM:
NUCLEAR MEDICINE GASTRIC EMPTYING SCAN
TECHNIQUE: After oral ingestion of radiolabeled meal, sequential abdominal
images were obtained for 4 hours. Percentage of activity emptying
the stomach was calculated at 1 hour, 2 hour, 3 hour, and 4 hours.
RADIOPHARMACEUTICALS:  2.1 mCi Vc-00m MDP labeled sulfur colloid
orally

[Series 1: 0 min · 4.14mm/px · 1 of 1 slices shown]
[im 1/1]
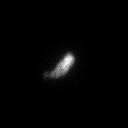

[1 of 1 positions shown; findings below may reference images not displayed]

FINDINGS: Expected location of the stomach in the left upper quadrant.
Ingested meal empties the stomach gradually over the course of the
study.

24% emptied at 1 hr ( normal >= 10%)

62% emptied at 2 hr ( normal >= 40%)

94% emptied at 3 hr ( normal >= 70%)
IMPRESSION: Normal gastric emptying study.

## 2017-11-13 NOTE — Progress Notes (Signed)
Office Visit Note  Patient: [name]             Date of Birth: 29-Feb-1950           MRN: [id number]             PCP: Biagio Borg, MD Referring: Biagio Borg, MD Visit Date: 11/20/2017 Occupation: _0 @  Subjective:  Pain in both knee joints   History of Present Illness: [name] is a 67 y.o. female with history of fibromyalgia, osteoarthritis, and DDD.  She reports she has bilateral knee pain that has been worsening over the past 1 year.  She states she received synvisc gel injections in the past.  She does not want to have knee replacements.  She reports she has been having severe pain at night in both knees.  She has difficulty climbing steps and getting up from a chair.  She reports intermittent swelling, worse in the right knee joint.  She has tried soaking her legs in epsom salt baths.  She also uses voltaren gel topically.  She reports she had a right hip replacement performed at [facility] 09/29/17, which is doing well. She continues to have muscle aches and muscle tenderness due to fibromyalgia.  She uses a fentanyl patch for pain relief.  She continues to have chronic fatigue and insomnia.  She has very interrupted sleep at night.    Activities of Daily Living:  Patient reports morning stiffness   all day.   Patient Reports nocturnal pain.  Difficulty dressing/grooming: Denies Difficulty climbing stairs: Reports Difficulty getting out of chair: Reports Difficulty using hands for taps, buttons, cutlery, and/or writing: Denies  Review of Systems  Constitutional: Positive for fatigue.  HENT: Positive for mouth dryness. Negative for mouth sores and nose dryness.   Eyes: Positive for dryness. Negative for pain and visual disturbance.  Respiratory: Negative for cough, hemoptysis, shortness of breath and difficulty breathing.   Cardiovascular: Positive for hypertension. Negative for chest pain, palpitations and swelling in legs/feet.  Gastrointestinal: Positive for  constipation and diarrhea (Hx of IBS). Negative for blood in stool.  Endocrine: Negative for increased urination.  Genitourinary: Negative for painful urination.  Musculoskeletal: Positive for arthralgias, joint pain, myalgias, morning stiffness, muscle tenderness and myalgias. Negative for joint swelling and muscle weakness.  Skin: Negative for color change, pallor, rash, hair loss, nodules/bumps, skin tightness, ulcers and sensitivity to sunlight.  Allergic/Immunologic: Negative for susceptible to infections.  Neurological: Positive for headaches (Hx of migraines). Negative for dizziness, numbness and weakness.  Hematological: Negative for swollen glands.  Psychiatric/Behavioral: Positive for sleep disturbance. Negative for depressed mood. The patient is not nervous/anxious.     PMFS History:  Patient Active Problem List   Diagnosis Date Noted  . RLQ abdominal pain 07/18/2017  . Hyperglycemia 07/18/2017  . Right lumbar radiculopathy 05/17/2017  . Abdominal pain, epigastric 05/17/2017  . Hematoma 05/17/2017  . Right leg pain 11/09/2016  . Left ear hearing loss 11/09/2016  . Right hip pain 03/17/2016  . Right groin pain 08/19/2015  . Diarrhea 07/22/2015  . Right low back pain 07/22/2015  . Left lower quadrant pain 08/21/2014  . Bloating 08/21/2014  . Nausea without vomiting 08/21/2014  . Panniculitis 06/12/2014  . Genetic testing 06/04/2014  . Family history of breast cancer 06/04/2014  . Peripheral edema 04/29/2014  . Bilateral leg edema 04/15/2014  . Mouth pain 03/13/2014  . Pain of right great toe 03/13/2014  . COPD exacerbation ([place]) 02/06/2014  .  Shingles outbreak 12/30/2013  . Thrush 12/30/2013  . Trigger point of thoracic region 12/30/2013  . Migraine headache 11/13/2013  . LUQ pain 05/08/2013  . Polycythemia, secondary 09/22/2012  . Chest pain 03/27/2012  . Cervical dysplasia   . Spell of dizziness 10/14/2011  . Bruising 10/14/2011  . High risk medication use  10/14/2011  . Lesion of mouth 05/30/2011  . Chronic sinusitis 05/30/2011  . Encounter for preventative adult health care exam with abnormal findings 02/25/2011  . Impaired glucose tolerance 02/25/2011  . Endometriosis   . OTHER&UNSPECIFIED DISEASES THE ORAL SOFT TISSUES 03/30/2010  . Family hx of colon cancer 03/11/2010  . THYROID NODULE, RIGHT 03/01/2010  . BACK PAIN 03/01/2010  . GOITER 10/26/2008  . CONSTIPATION 11/18/2007  . LUMBAR RADICULOPATHY, LEFT 11/07/2007  . Hypothyroidism 05/31/2007  . ANXIETY 05/31/2007  . ASTHMA 05/31/2007  . COPD (chronic obstructive pulmonary disease) ([place]) 05/31/2007  . COLONIC POLYPS, HX OF 05/31/2007  . HYPERLIPIDEMIA 09/22/2006  . Obesity 09/22/2006  . SICKLE CELL TRAIT 09/22/2006  . Depression 09/22/2006  . OSA (obstructive sleep apnea) 09/22/2006  . Essential hypertension 09/22/2006  . VENOUS INSUFFICIENCY 09/22/2006  . ALLERGIC RHINITIS 09/22/2006  . GERD 09/22/2006  . PEPTIC ULCER DISEASE 09/22/2006  . Irritable bowel syndrome 09/22/2006  . OSTEOARTHRITIS 09/22/2006  . FIBROMYALGIA 09/22/2006  . OSTEOPOROSIS 09/22/2006  . FATIGUE, CHRONIC 09/22/2006  . PERIPHERAL EDEMA 09/22/2006  . MITRAL VALVE PROLAPSE, HX OF 09/22/2006    Past Medical History:  Diagnosis Date  . Anxiety   . Arthritis   . Asthma   . Asthma   . Biliary dyskinesia   . Bowel obstruction ([place])   . BRCA negative 02/2013  . Cervical dysplasia   . Complication of anesthesia    "I have the shakes"  . Depression   . Diverticulosis   . Elevated cholesterol   . Endometriosis   . Fibromyalgia   . GERD (gastroesophageal reflux disease)   . Heart murmur    mvp  . Hx of oral aphthous ulcers   . Hypertension   . Hypertension    no med  . Hypothyroidism    hx nodule on thyroid   . Impaired glucose tolerance 02/25/2011  . Low back pain   . Migraine   . Migraines   . Nocturia   . Polycythemia vera ([place])   . Shingles   . Shortness of breath    with exertion  .  Sickle cell trait ([place])   . Sleep apnea    has not used c-pap x 1 yr. use mouthguard now  . Thyroid disease   . Thyroid disease    Hypothyroid    Family History  Problem Relation Age of Onset  . Hypertension Mother   . Heart disease Mother   . Breast cancer Mother        Age 38  . Bone cancer Mother   . Hypertension Father   . Heart disease Father   . Diabetes Father   . Colon cancer Sister 32  . Sarcoidosis Sister   . Breast cancer Sister 38  . Breast cancer Maternal Grandmother        Age 82  . Ovarian cancer Paternal Grandmother   . Breast cancer Paternal Grandmother        Age unknown  . Sarcoidosis Brother   . Breast cancer Maternal Aunt        Age 13's  . Breast cancer Maternal Aunt  Age 56  . Heart failure Father   . Arthritis Father   . Colon cancer Sister   . Sarcoidosis Sister   . Multiple sclerosis Cousin    Past Surgical History:  Procedure Laterality Date  . ABDOMINAL HYSTERECTOMY    . BREAST EXCISIONAL BIOPSY Left 1999  . BREAST SURGERY     Cystectomy on Left breast  . BREAST SURGERY     Benign breast cyst  . CHOLECYSTECTOMY    . CHOLECYSTECTOMY  12/05/2011   Procedure: LAPAROSCOPIC CHOLECYSTECTOMY;  Surgeon: Ralene Ok, MD;  Location: [facility];  Service: General;  Laterality: N/A;  . COLPOSCOPY    . GYNECOLOGIC CRYOSURGERY    . HERNIA REPAIR     abdomen  . OOPHORECTOMY     BSO  . PANNICULECTOMY N/A 06/12/2014   Procedure: PANNICULECTOMY;  Surgeon: Irene Limbo, MD;  Location: [place];  Service: Plastics;  Laterality: N/A;  . PELVIC LAPAROSCOPY  1995,2002   DL LSO DL RSO  . TONSILLECTOMY    . TOTAL HIP ARTHROPLASTY     Right  . VAGINAL HYSTERECTOMY  1983   Social History   Social History Narrative   Lives at home alone.   Right-handed.   6 cups per week.    Objective: Vital Signs: BP (!) 158/90 (BP Location: Left Arm, Patient Position: Sitting, Cuff Size: Normal) Comment: PT HASNT TAKEN BP MEDS,  Pulse  76   Ht 5' 4" (1.626 m)   Wt 230 lb (104.3 kg)   BMI 39.48 kg/m    Physical Exam  Constitutional: She is oriented to person, place, and time. She appears well-developed and well-nourished.  HENT:  Head: Normocephalic and atraumatic.  Eyes: Conjunctivae and EOM are normal.  Neck: Normal range of motion.  Cardiovascular: Normal rate, regular rhythm, normal heart sounds and intact distal pulses.  Pulmonary/Chest: Effort normal and breath sounds normal.  Abdominal: Soft. Bowel sounds are normal.  Lymphadenopathy:    She has no cervical adenopathy.  Neurological: She is alert and oriented to person, place, and time.  Skin: Skin is warm and dry. Capillary refill takes less than 2 seconds.  Psychiatric: She has a normal mood and affect. Her behavior is normal.  Nursing note and vitals reviewed.    Musculoskeletal Exam: C-spine, thoracic spine, and lumbar spine good ROM.  Shoulder joint abduction to about 90 degrees abduction.  Elbow joints, wrist joints, MCPs, PIPs, and DIPs good ROM with no synovitis.  Right hip replacement good ROM with no discomfort.  Left hip full ROM with no discomfort.  Knee joints limited extension.  No warmth or effusion noted.  Pedal edema bilaterally.  Tenderness of bilateral IT bands.   CDAI Exam: CDAI Score: Not documented Patient Global Assessment: Not documented; Provider Global Assessment: Not documented Swollen: Not documented; Tender: Not documented Joint Exam   Not documented   There is currently no information documented on the homunculus. Go to the Rheumatology activity and complete the homunculus joint exam.  Investigation: No additional findings.  Imaging: Xr Knee 3 View Left  Result Date: 11/20/2017 Moderate to severe medial compartment narrowing was noted.  Intercondylar osteophytes were noted.  No chondrocalcinosis was noted.  Severe patellofemoral narrowing was noted. Impression: These findings are consistent with moderate to severe  osteoarthritis and severe chondromalacia patella.  Xr Knee 3 View Right  Result Date: 11/20/2017 Moderate medial compartment narrowing was noted.  Intercondylar osteophytes were noted.  Severe patellofemoral narrowing was noted.  A bony island was  noted in the distal femur. Impression: These findings are consistent with moderate osteoarthritis and moderate chondromalacia patella.   Recent Labs: Lab Results  Component Value Date   WBC 10.0 07/18/2017   HGB 16.0 (H) 07/18/2017   PLT 335.0 07/18/2017   NA 140 07/18/2017   K 3.9 07/18/2017   CL 102 07/18/2017   CO2 34 (H) 07/18/2017   GLUCOSE 103 (H) 07/18/2017   BUN 11 07/18/2017   CREATININE 0.96 07/18/2017   BILITOT 0.4 07/18/2017   ALKPHOS 67 07/18/2017   AST 14 07/18/2017   ALT 11 07/18/2017   PROT 7.5 07/18/2017   ALBUMIN 3.8 07/18/2017   CALCIUM 9.2 07/18/2017   GFRAA >60 09/05/2015    Speciality Comments: No specialty comments available.  Procedures:  No procedures performed Allergies: Celecoxib; Codeine; Codeine; Duloxetine; Erythromycin; Escitalopram oxalate; Ezetimibe; Gluten meal; Lactose intolerance (gi); Lyrica [pregabalin]; Milnacipran; Pregabalin; Rofecoxib; Statins; Statins; Tramadol; Ultram [tramadol hcl]; Celebrex [celecoxib]; and Contrast media [iodinated diagnostic agents]   Assessment / Plan:     Visit Diagnoses: Fibromyalgia: She has generalized muscle aches and muscle tenderness due to fibromyalgia.  She has generalized hyperalgesia.  She continues to have chronic fatigue and insomnia.  She has interrupted sleep at night due to experiencing increased bilateral nee pain. She uses Fentanyl patches PRN for pain relief.    Primary osteoarthritis of both knees: She has chronic pain in both knee joints.  No warmth or effusion noted.  She has limited extension and flexion of both knees with discomfort.  She has been having worsening bilateral knee pain, worse in the right knee joint.  She has had synvisc  injections in the past.  She does not want to have knee replacements in the future.  She would like to reapply for synvisc injections. She has been using voltaren gel topically.  She has also tried soaking both legs in epsom salt baths. X-rays of both knee joints were obtained today.    Chronic pain of both knees -She has been having worsening pain in both knee joints.  X-rays of both knees were obtained today.  She would like to reapply for visco gel injections of both knee joints.  Plan: XR KNEE 3 VIEW RIGHT, XR KNEE 3 VIEW LEFT   DDD (degenerative disc disease), lumbar: Chronic pain.  She steroid injections on a regular basis.  Her next injection is in November 2019.   Other fatigue: Chronic and related to insomnia.   Other insomnia: She has interrupted sleep at night due to pain.   Other medical conditions are listed as follows:   Hx of migraines  Essential hypertension  MVP (mitral valve prolapse)  History of gastroesophageal reflux (GERD)  History of asthma  History of sleep apnea  Anxiety and depression  Polycythemia, secondary  History of COPD  History of hypothyroidism  History of IBS  History of diverticulosis  Sickle cell trait ([place])    Orders: Orders Placed This Encounter  Procedures  . XR KNEE 3 VIEW RIGHT  . XR KNEE 3 VIEW LEFT   No orders of the defined types were placed in this encounter.   Face-to-face time spent with patient was 28mnutes. Greater than 50% of time was spent in counseling and coordination of care.  Follow-Up Instructions: Return in about 6 months (around 05/22/2018) for Fibromyalgia, Osteoarthritis, DDD.   TOfilia Neas PA-C  Note - This record has been created using Dragon software.  Chart creation errors have been sought, but may not always  have been located. Such creation errors do not reflect on  the standard of medical care.

## 2017-11-16 ENCOUNTER — Other Ambulatory Visit: Payer: Self-pay | Admitting: Internal Medicine

## 2017-11-20 ENCOUNTER — Telehealth (INDEPENDENT_AMBULATORY_CARE_PROVIDER_SITE_OTHER): Payer: Self-pay | Admitting: *Deleted

## 2017-11-20 ENCOUNTER — Telehealth: Payer: Self-pay

## 2017-11-20 ENCOUNTER — Encounter: Payer: Self-pay | Admitting: Physician Assistant

## 2017-11-20 ENCOUNTER — Ambulatory Visit: Payer: Medicare Other | Admitting: Physician Assistant

## 2017-11-20 ENCOUNTER — Ambulatory Visit (INDEPENDENT_AMBULATORY_CARE_PROVIDER_SITE_OTHER): Payer: Self-pay

## 2017-11-20 VITALS — BP 158/90 | HR 76 | Ht 64.0 in | Wt 230.0 lb

## 2017-11-20 DIAGNOSIS — D751 Secondary polycythemia: Secondary | ICD-10-CM

## 2017-11-20 DIAGNOSIS — F419 Anxiety disorder, unspecified: Secondary | ICD-10-CM

## 2017-11-20 DIAGNOSIS — Z8719 Personal history of other diseases of the digestive system: Secondary | ICD-10-CM

## 2017-11-20 DIAGNOSIS — M797 Fibromyalgia: Secondary | ICD-10-CM

## 2017-11-20 DIAGNOSIS — M170 Bilateral primary osteoarthritis of knee: Secondary | ICD-10-CM | POA: Diagnosis not present

## 2017-11-20 DIAGNOSIS — F329 Major depressive disorder, single episode, unspecified: Secondary | ICD-10-CM

## 2017-11-20 DIAGNOSIS — I341 Nonrheumatic mitral (valve) prolapse: Secondary | ICD-10-CM

## 2017-11-20 DIAGNOSIS — G4709 Other insomnia: Secondary | ICD-10-CM

## 2017-11-20 DIAGNOSIS — M25562 Pain in left knee: Secondary | ICD-10-CM

## 2017-11-20 DIAGNOSIS — M5136 Other intervertebral disc degeneration, lumbar region: Secondary | ICD-10-CM

## 2017-11-20 DIAGNOSIS — D573 Sickle-cell trait: Secondary | ICD-10-CM

## 2017-11-20 DIAGNOSIS — R5383 Other fatigue: Secondary | ICD-10-CM

## 2017-11-20 DIAGNOSIS — Z8709 Personal history of other diseases of the respiratory system: Secondary | ICD-10-CM

## 2017-11-20 DIAGNOSIS — I10 Essential (primary) hypertension: Secondary | ICD-10-CM

## 2017-11-20 DIAGNOSIS — Z8669 Personal history of other diseases of the nervous system and sense organs: Secondary | ICD-10-CM

## 2017-11-20 DIAGNOSIS — Z8639 Personal history of other endocrine, nutritional and metabolic disease: Secondary | ICD-10-CM

## 2017-11-20 DIAGNOSIS — M7061 Trochanteric bursitis, right hip: Secondary | ICD-10-CM

## 2017-11-20 DIAGNOSIS — G8929 Other chronic pain: Secondary | ICD-10-CM

## 2017-11-20 DIAGNOSIS — M25561 Pain in right knee: Secondary | ICD-10-CM

## 2017-11-20 MED ORDER — DICLOFENAC SODIUM 1 % TD GEL: 2.0000 g | Freq: Four times a day (QID) | TRANSDERMAL | 2 refills | Status: DC

## 2017-11-20 NOTE — Telephone Encounter (Signed)
FYI Thank you!

## 2017-11-20 NOTE — Telephone Encounter (Signed)
Patient is in a lot of pain. Please apply urgent if possible. Thank you.

## 2017-11-20 NOTE — Telephone Encounter (Signed)
Noted  

## 2017-11-20 NOTE — Telephone Encounter (Signed)
Please apply for bilateral knee visco, per Lovena Le. Patient has had synvisc in the past.Thanks!

## 2017-11-20 NOTE — Telephone Encounter (Signed)
Please apply urgent if possible. Thank you.

## 2017-11-20 NOTE — Patient Instructions (Signed)

## 2017-11-23 NOTE — Telephone Encounter (Signed)
Noted  

## 2017-11-26 ENCOUNTER — Telehealth (INDEPENDENT_AMBULATORY_CARE_PROVIDER_SITE_OTHER): Payer: Self-pay

## 2017-11-26 NOTE — Telephone Encounter (Signed)
Submitted VOB for Synvisc series, bilateral knee. 

## 2017-12-03 ENCOUNTER — Other Ambulatory Visit: Payer: Self-pay | Admitting: Gynecology

## 2017-12-03 ENCOUNTER — Encounter: Payer: Self-pay | Admitting: Internal Medicine

## 2017-12-03 DIAGNOSIS — Z1231 Encounter for screening mammogram for malignant neoplasm of breast: Secondary | ICD-10-CM

## 2017-12-04 ENCOUNTER — Ambulatory Visit
Admission: RE | Admit: 2017-12-04 | Discharge: 2017-12-04 | Disposition: A | Discharge status: Home / Self Care | Payer: Medicare Other | Source: Ambulatory Visit | Attending: Gynecology | Admitting: Gynecology

## 2017-12-04 DIAGNOSIS — Z1231 Encounter for screening mammogram for malignant neoplasm of breast: Secondary | ICD-10-CM

## 2017-12-06 ENCOUNTER — Telehealth (INDEPENDENT_AMBULATORY_CARE_PROVIDER_SITE_OTHER): Payer: Self-pay

## 2017-12-06 ENCOUNTER — Other Ambulatory Visit: Payer: Self-pay | Admitting: Gynecology

## 2017-12-06 DIAGNOSIS — R928 Other abnormal and inconclusive findings on diagnostic imaging of breast: Secondary | ICD-10-CM

## 2017-12-06 NOTE — Telephone Encounter (Signed)
Please schedule appointment for gel injection with Dr. Estanislado Pandy or Lovena Le.  Thank You.  Patient is approved for Synvisc series, bilateral knee. Buy & Bill Covered at 100% after Co-pay Co-pay $90.00 No PA required

## 2017-12-10 NOTE — Telephone Encounter (Signed)
Please schedule w/Taylor. Thank you.

## 2017-12-10 NOTE — Telephone Encounter (Signed)
Attempted to contact patient and left message on machine to advise patient to call the office.  

## 2017-12-12 ENCOUNTER — Ambulatory Visit
Admission: RE | Admit: 2017-12-12 | Discharge: 2017-12-12 | Disposition: A | Discharge status: Home / Self Care | Payer: Medicare Other | Source: Ambulatory Visit | Attending: Gynecology | Admitting: Gynecology

## 2017-12-12 ENCOUNTER — Ambulatory Visit: Payer: Medicare Other

## 2017-12-12 DIAGNOSIS — R928 Other abnormal and inconclusive findings on diagnostic imaging of breast: Secondary | ICD-10-CM

## 2017-12-13 ENCOUNTER — Encounter: Payer: Self-pay | Admitting: Internal Medicine

## 2017-12-13 MED ORDER — AMLODIPINE BESYLATE 5 MG PO TABS: 5.0000 mg | ORAL_TABLET | Freq: Every day | ORAL | 3 refills | Status: DC

## 2017-12-13 NOTE — Telephone Encounter (Signed)
Spoke with patient who stated that she has had migraines for the past couple of days  with high blood pressure which is being monitored by her PCP Dr. Cathlean Cower.  Patient states she will call back to schedule the injections after they get her blood pressure under control.

## 2017-12-13 NOTE — Telephone Encounter (Signed)
Please schedule. Thank you

## 2018-01-01 ENCOUNTER — Other Ambulatory Visit: Payer: Self-pay | Admitting: Internal Medicine

## 2018-01-22 ENCOUNTER — Telehealth: Payer: Self-pay | Admitting: Rheumatology

## 2018-01-22 NOTE — Telephone Encounter (Signed)
I will have to resubmit for VOB after the new year being that her previous benefits were for 2019.  Please advise patient.  Than you.

## 2018-01-22 NOTE — Telephone Encounter (Signed)
Left message to let patient know that [name] will resubmit verification of benefits.

## 2018-01-22 NOTE — Telephone Encounter (Signed)
Patient called stating she is ready to schedule her Synvisc injections.  Patient states she her insurance approved her injections in November and wants to make sure it is still okay to schedule.  Please advise.

## 2018-01-29 ENCOUNTER — Telehealth: Payer: Self-pay | Admitting: Rheumatology

## 2018-01-29 ENCOUNTER — Telehealth (INDEPENDENT_AMBULATORY_CARE_PROVIDER_SITE_OTHER): Payer: Self-pay

## 2018-01-29 NOTE — Telephone Encounter (Signed)
Resubmitted VOB for Synvisc series, bilateral knee.

## 2018-01-29 NOTE — Telephone Encounter (Signed)
Patient called stating she has pain and swelling in both knees, legs and toes on both feet.  Patient states they "feel strange and it hurts to walk on them."  Patient states her two middle toes on both feet are cold.  Patient states her right leg stays swollen below the knee and has problems with inflammation since her hip replacement.  Patient requested a return call before scheduling another appointment.

## 2018-01-30 NOTE — Telephone Encounter (Signed)
Patient states she is having hurting at her knee cap and below. Patient states she is having swelling. Patient states the pain is severe. Patient states when she walks she is having weakness. Patient states her knees feel warm to touch. Patient states on her left foot she is having pain in her second and third toe. Patient states she has been using Voltaren Gel an blue emo gel. Patient states she is not getting much relief. Patient has been scheduled for 01/31/18 at 10:20 am

## 2018-01-30 NOTE — Progress Notes (Signed)
Office Visit Note  Patient: [name]             Date of Birth: November 19, 1950           MRN: [id number]             PCP: Biagio Borg, MD Referring: Biagio Borg, MD Visit Date: 01/31/2018 Occupation: '@GUAROCC'$ @  Subjective:  Pain in both knee joints   History of Present Illness: Sylvester Salonga is a 68 y.o. female with history of fibromyalgia, DDD, and osteoarthritis.  She presents today with bilateral knee pain.  She states the pain has been severe. She reports she has noticed swelling in both knee joints.   She has difficulty getting up from a chair and climbing steps. She states the right hip that has been replaced remains painful but is still better than it was prior to surgery.  She states she is having right sided lower back pain.  She has been receiving lower back injections in [place] by Dr. Dolly Rias.  She continues to have generalized muscle aches and muscle tenderness due to fibromyalgia.   Activities of Daily Living:  Patient reports morning stiffness   all day.   Patient Reports nocturnal pain.  Difficulty dressing/grooming: Denies Difficulty climbing stairs: Reports Difficulty getting out of chair: Reports Difficulty using hands for taps, buttons, cutlery, and/or writing: Reports  Review of Systems  Constitutional: Positive for fatigue.  HENT: Negative for mouth sores, mouth dryness and nose dryness.   Eyes: Negative for pain, visual disturbance and dryness.  Respiratory: Negative for cough, hemoptysis, shortness of breath and difficulty breathing.   Cardiovascular: Positive for swelling in legs/feet. Negative for chest pain, palpitations and hypertension.  Gastrointestinal: Negative for blood in stool, constipation and diarrhea.  Endocrine: Negative for increased urination.  Genitourinary: Negative for painful urination.  Musculoskeletal: Positive for arthralgias, joint pain, joint swelling, myalgias, muscle tenderness and myalgias. Negative for  muscle weakness and morning stiffness.  Skin: Negative for color change, pallor, rash, hair loss, nodules/bumps, skin tightness, ulcers and sensitivity to sunlight.  Allergic/Immunologic: Negative for susceptible to infections.  Neurological: Negative for dizziness, numbness, headaches and weakness.  Hematological: Negative for swollen glands.  Psychiatric/Behavioral: Positive for sleep disturbance. Negative for depressed mood. The patient is not nervous/anxious.     PMFS History:  Patient Active Problem List   Diagnosis Date Noted  . RLQ abdominal pain 07/18/2017  . Hyperglycemia 07/18/2017  . Right lumbar radiculopathy 05/17/2017  . Abdominal pain, epigastric 05/17/2017  . Hematoma 05/17/2017  . Right leg pain 11/09/2016  . Left ear hearing loss 11/09/2016  . Right hip pain 03/17/2016  . Right groin pain 08/19/2015  . Diarrhea 07/22/2015  . Right low back pain 07/22/2015  . Left lower quadrant pain 08/21/2014  . Bloating 08/21/2014  . Nausea without vomiting 08/21/2014  . Panniculitis 06/12/2014  . Genetic testing 06/04/2014  . Family history of breast cancer 06/04/2014  . Peripheral edema 04/29/2014  . Bilateral leg edema 04/15/2014  . Mouth pain 03/13/2014  . Pain of right great toe 03/13/2014  . COPD exacerbation ([place]) 02/06/2014  . Shingles outbreak 12/30/2013  . Thrush 12/30/2013  . Trigger point of thoracic region 12/30/2013  . Migraine headache 11/13/2013  . LUQ pain 05/08/2013  . Polycythemia, secondary 09/22/2012  . Chest pain 03/27/2012  . Cervical dysplasia   . Spell of dizziness 10/14/2011  . Bruising 10/14/2011  . High risk medication use 10/14/2011  . Lesion of mouth 05/30/2011  .  Chronic sinusitis 05/30/2011  . Encounter for preventative adult health care exam with abnormal findings 02/25/2011  . Impaired glucose tolerance 02/25/2011  . Endometriosis   . OTHER&UNSPECIFIED DISEASES THE ORAL SOFT TISSUES 03/30/2010  . Family hx of colon cancer  03/11/2010  . THYROID NODULE, RIGHT 03/01/2010  . BACK PAIN 03/01/2010  . GOITER 10/26/2008  . CONSTIPATION 11/18/2007  . LUMBAR RADICULOPATHY, LEFT 11/07/2007  . Hypothyroidism 05/31/2007  . ANXIETY 05/31/2007  . ASTHMA 05/31/2007  . COPD (chronic obstructive pulmonary disease) ([place]) 05/31/2007  . COLONIC POLYPS, HX OF 05/31/2007  . HYPERLIPIDEMIA 09/22/2006  . Obesity 09/22/2006  . SICKLE CELL TRAIT 09/22/2006  . Depression 09/22/2006  . OSA (obstructive sleep apnea) 09/22/2006  . Essential hypertension 09/22/2006  . VENOUS INSUFFICIENCY 09/22/2006  . ALLERGIC RHINITIS 09/22/2006  . GERD 09/22/2006  . PEPTIC ULCER DISEASE 09/22/2006  . Irritable bowel syndrome 09/22/2006  . OSTEOARTHRITIS 09/22/2006  . FIBROMYALGIA 09/22/2006  . OSTEOPOROSIS 09/22/2006  . FATIGUE, CHRONIC 09/22/2006  . PERIPHERAL EDEMA 09/22/2006  . MITRAL VALVE PROLAPSE, HX OF 09/22/2006    Past Medical History:  Diagnosis Date  . Anxiety   . Arthritis   . Asthma   . Asthma   . Biliary dyskinesia   . Bowel obstruction ([place])   . BRCA negative 02/2013  . Cervical dysplasia   . Complication of anesthesia    "I have the shakes"  . Depression   . Diverticulosis   . Elevated cholesterol   . Endometriosis   . Fibromyalgia   . GERD (gastroesophageal reflux disease)   . Heart murmur    mvp  . Hx of oral aphthous ulcers   . Hypertension   . Hypertension    no med  . Hypothyroidism    hx nodule on thyroid   . Impaired glucose tolerance 02/25/2011  . Low back pain   . Migraine   . Migraines   . Nocturia   . Polycythemia vera ([place])   . Shingles   . Shortness of breath    with exertion  . Sickle cell trait ([place])   . Sleep apnea    has not used c-pap x 1 yr. use mouthguard now  . Thyroid disease   . Thyroid disease    Hypothyroid    Family History  Problem Relation Age of Onset  . Hypertension Mother   . Heart disease Mother   . Breast cancer Mother        Age 74  . Bone cancer Mother     . Hypertension Father   . Heart disease Father   . Diabetes Father   . Colon cancer Sister 49  . Sarcoidosis Sister   . Breast cancer Sister 35  . Breast cancer Maternal Grandmother        Age 11  . Ovarian cancer Paternal Grandmother   . Breast cancer Paternal Grandmother        Age unknown  . Sarcoidosis Brother   . Breast cancer Maternal Aunt        Age 44's  . Breast cancer Maternal Aunt        Age 66  . Heart failure Father   . Arthritis Father   . Colon cancer Sister   . Sarcoidosis Sister   . Multiple sclerosis Cousin    Past Surgical History:  Procedure Laterality Date  . ABDOMINAL HYSTERECTOMY    . BREAST EXCISIONAL BIOPSY Left 1999  . BREAST SURGERY     Cystectomy on Left breast  .  BREAST SURGERY     Benign breast cyst  . CHOLECYSTECTOMY    . CHOLECYSTECTOMY  12/05/2011   Procedure: LAPAROSCOPIC CHOLECYSTECTOMY;  Surgeon: Ralene Ok, MD;  Location: [facility];  Service: General;  Laterality: N/A;  . COLPOSCOPY    . GYNECOLOGIC CRYOSURGERY    . HERNIA REPAIR     abdomen  . JOINT REPLACEMENT Right    hip  . OOPHORECTOMY     BSO  . PANNICULECTOMY N/A 06/12/2014   Procedure: PANNICULECTOMY;  Surgeon: Irene Limbo, MD;  Location: [facility];  Service: Plastics;  Laterality: N/A;  . PELVIC LAPAROSCOPY  1995,2002   DL LSO DL RSO  . TONSILLECTOMY    . TOTAL HIP ARTHROPLASTY     Right  . VAGINAL HYSTERECTOMY  1983   Social History   Social History Narrative   Lives at home alone.   Right-handed.   6 cups per week.    Objective: Vital Signs: BP (!) 141/85 (BP Location: Left Arm, Patient Position: Sitting, Cuff Size: Large)   Pulse 68   Resp 16   Ht '5\' 4"'$  (1.626 m)   Wt 233 lb (105.7 kg)   BMI 39.99 kg/m    Physical Exam Vitals signs and nursing note reviewed.  Constitutional:      Appearance: She is well-developed.  HENT:     Head: Normocephalic and atraumatic.  Eyes:     Conjunctiva/sclera: Conjunctivae normal.  Neck:      Musculoskeletal: Normal range of motion.  Cardiovascular:     Rate and Rhythm: Normal rate and regular rhythm.     Heart sounds: Normal heart sounds.  Pulmonary:     Effort: Pulmonary effort is normal.     Breath sounds: Normal breath sounds.  Abdominal:     General: Bowel sounds are normal.     Palpations: Abdomen is soft.  Lymphadenopathy:     Cervical: No cervical adenopathy.  Skin:    General: Skin is warm and dry.     Capillary Refill: Capillary refill takes less than 2 seconds.  Neurological:     Mental Status: She is alert and oriented to person, place, and time.  Psychiatric:        Behavior: Behavior normal.      Musculoskeletal Exam: Positive tender points on exam. C-spine good ROM. Limited ROM of lumbar spine with discomfort. Shoulder joints, elbow joints, wrist joints, MCPs, PIPs, and DIPs good ROM with no synovitis.  Complete fist formation bilaterally. Right hip limited ROM with discomfort. No warmth or effusion of knee joints.  No tenderness or swelling of ankle joints. Pes planus bilaterally. Pedal edema bilaterally.   CDAI Exam: CDAI Score: Not documented Patient Global Assessment: Not documented; Provider Global Assessment: Not documented Swollen: Not documented; Tender: Not documented Joint Exam   Not documented   There is currently no information documented on the homunculus. Go to the Rheumatology activity and complete the homunculus joint exam.  Investigation: No additional findings.  Imaging: No results found.  Recent Labs: Lab Results  Component Value Date   WBC 10.0 07/18/2017   HGB 16.0 (H) 07/18/2017   PLT 335.0 07/18/2017   NA 140 07/18/2017   K 3.9 07/18/2017   CL 102 07/18/2017   CO2 34 (H) 07/18/2017   GLUCOSE 103 (H) 07/18/2017   BUN 11 07/18/2017   CREATININE 0.96 07/18/2017   BILITOT 0.4 07/18/2017   ALKPHOS 67 07/18/2017   AST 14 07/18/2017   ALT 11 07/18/2017   PROT  7.5 07/18/2017   ALBUMIN 3.8 07/18/2017   CALCIUM 9.2  07/18/2017   GFRAA >60 09/05/2015    Speciality Comments: No specialty comments available.  Procedures:  No procedures performed Allergies: Celecoxib; Codeine; Codeine; Duloxetine; Erythromycin; Escitalopram oxalate; Ezetimibe; Gluten meal; Lactose intolerance (gi); Lyrica [pregabalin]; Milnacipran; Pregabalin; Rofecoxib; Statins; Statins; Tramadol; Ultram [tramadol hcl]; Celebrex [celecoxib]; and Contrast media [iodinated diagnostic agents]       Assessment / Plan:     Visit Diagnoses: Fibromyalgia: She has positive tender points on exam.  She has generalized hyperalgesia.  She continues to have generalized muscle aches and muscle tenderness.  She takes Gabapentin 100 mg PRN and takes Diclofenac 75 mg BID for pain relief.  She has been having increased pain in both knees, which has been limiting how active she has been able to be.  She is waiting for the approval of visco gel injections.  We discussed the importance of good sleep and regular exercise.   Primary osteoarthritis of both knees: No warmth or effusion.  She has painful ROM on exam.  She has been experiencing severe pain in both knee joints. She does not get any relief from cortisone injections.  She declined a cortisone injection today.  We will notify her when her visco gel injections have been approved.  She was given a refill of voltaren gel.    DDD (degenerative disc disease), lumbar: Chronic pain.  She has limited ROM with discomfort.   Other insomnia: She continues to have interrupted sleep at night due to the discomfort she experiences.   Other fatigue: She continues to have chronic fatigue.    Polyarthralgia - She has no synovitis on exam. We discussed that in the future if she continues to have severe pain in both feet we can consider scheduling an ultrasound to assess for synovitis.  Patient is requesting to have autoimmune labs checked today. Plan: 14-3-3 eta Protein, Cyclic citrul peptide antibody, IgG, Rheumatoid  factor, Uric acid, Angiotensin converting enzyme, Sedimentation rate, ANA   Other medical conditions are listed as follows:   History of gastroesophageal reflux (GERD)  Essential hypertension  MVP (mitral valve prolapse)  Hx of migraines  Anxiety and depression  History of COPD  History of hypothyroidism  History of sleep apnea  Polycythemia, secondary  History of asthma  Sickle cell trait ([facility])  History of diverticulosis  History of IBS    Orders: Orders Placed This Encounter  Procedures  . 14-3-3 eta Protein  . Cyclic citrul peptide antibody, IgG  . Rheumatoid factor  . Uric acid  . Angiotensin converting enzyme  . Sedimentation rate  . ANA   Meds ordered this encounter  Medications  . diclofenac sodium (VOLTAREN) 1 % GEL    Sig: Apply 3 grams to 3 large joints up to 3 times daily.    Dispense:  3 Tube    Refill:  3     Follow-Up Instructions: Return in about 6 months (around 08/01/2018) for Fibromyalgia, Osteoarthritis.   Ofilia Neas, PA-C   I examined and evaluated the patient with Hazel Sams PA.  Patient continues to have some generalized pain from fibromyalgia.  She has discomfort in her bilateral knee joints.  No warmth swelling effusion was noted.  She is awaiting Visco supplement injections.  Once available we will schedule those.  The plan of care was discussed as noted above.  Bo Merino, MD  Note - This record has been created using Editor, commissioning.  Chart creation errors have  been sought, but may not always  have been located. Such creation errors do not reflect on  the standard of medical care.

## 2018-01-31 ENCOUNTER — Encounter: Payer: Self-pay | Admitting: Physician Assistant

## 2018-01-31 ENCOUNTER — Telehealth (INDEPENDENT_AMBULATORY_CARE_PROVIDER_SITE_OTHER): Payer: Self-pay

## 2018-01-31 ENCOUNTER — Ambulatory Visit: Payer: Medicare Other | Admitting: Rheumatology

## 2018-01-31 VITALS — BP 141/85 | HR 68 | Resp 16 | Ht 64.0 in | Wt 233.0 lb

## 2018-01-31 DIAGNOSIS — F419 Anxiety disorder, unspecified: Secondary | ICD-10-CM

## 2018-01-31 DIAGNOSIS — M2550 Pain in unspecified joint: Secondary | ICD-10-CM

## 2018-01-31 DIAGNOSIS — G4709 Other insomnia: Secondary | ICD-10-CM | POA: Diagnosis not present

## 2018-01-31 DIAGNOSIS — I10 Essential (primary) hypertension: Secondary | ICD-10-CM

## 2018-01-31 DIAGNOSIS — Z8709 Personal history of other diseases of the respiratory system: Secondary | ICD-10-CM

## 2018-01-31 DIAGNOSIS — D751 Secondary polycythemia: Secondary | ICD-10-CM

## 2018-01-31 DIAGNOSIS — M5136 Other intervertebral disc degeneration, lumbar region: Secondary | ICD-10-CM

## 2018-01-31 DIAGNOSIS — F32A Depression, unspecified: Secondary | ICD-10-CM

## 2018-01-31 DIAGNOSIS — Z8639 Personal history of other endocrine, nutritional and metabolic disease: Secondary | ICD-10-CM

## 2018-01-31 DIAGNOSIS — M170 Bilateral primary osteoarthritis of knee: Secondary | ICD-10-CM

## 2018-01-31 DIAGNOSIS — M797 Fibromyalgia: Secondary | ICD-10-CM | POA: Diagnosis not present

## 2018-01-31 DIAGNOSIS — I341 Nonrheumatic mitral (valve) prolapse: Secondary | ICD-10-CM

## 2018-01-31 DIAGNOSIS — Z8669 Personal history of other diseases of the nervous system and sense organs: Secondary | ICD-10-CM

## 2018-01-31 DIAGNOSIS — R5383 Other fatigue: Secondary | ICD-10-CM

## 2018-01-31 DIAGNOSIS — M51369 Other intervertebral disc degeneration, lumbar region without mention of lumbar back pain or lower extremity pain: Secondary | ICD-10-CM

## 2018-01-31 DIAGNOSIS — Z8719 Personal history of other diseases of the digestive system: Secondary | ICD-10-CM

## 2018-01-31 DIAGNOSIS — D573 Sickle-cell trait: Secondary | ICD-10-CM

## 2018-01-31 DIAGNOSIS — F329 Major depressive disorder, single episode, unspecified: Secondary | ICD-10-CM

## 2018-01-31 MED ORDER — DICLOFENAC SODIUM 1 % TD GEL: TRANSDERMAL | 3 refills | Status: AC

## 2018-01-31 NOTE — Telephone Encounter (Signed)
Please schedule patient an appointment with Dr. Estanislado Pandy or Lovena Le for gel injection.  Thank You.  Patient approved for Synvisc series, bilateral knee. [place] Covered at 100% after Co-pay through insurance. Co-pay of $90.00 No PA required

## 2018-02-05 NOTE — Progress Notes (Signed)
CCP and RF negative. Uric acid WNL. ACE WNL. Sed rate WNL. ANA is low titer.

## 2018-02-07 LAB — ANA: Anti Nuclear Antibody(ANA): POSITIVE — AB

## 2018-02-07 LAB — ANGIOTENSIN CONVERTING ENZYME: Angiotensin-Converting Enzyme: 26 U/L (ref 9–67)

## 2018-02-07 LAB — RHEUMATOID FACTOR: Rheumatoid fact SerPl-aCnc: <14 IU/mL (ref <14)

## 2018-02-07 LAB — 14-3-3 ETA PROTEIN: 14-3-3 eta Protein: <0.2 ng/mL (ref <0.2)

## 2018-02-07 LAB — SEDIMENTATION RATE: Sed Rate: 11 mm/h (ref 0–30)

## 2018-02-07 LAB — URIC ACID: Uric Acid, Serum: 4.4 mg/dL (ref 2.5–7.0)

## 2018-02-07 LAB — ANTI-NUCLEAR AB-TITER (ANA TITER): ANA Titer 1: 1:80 {titer} — ABNORMAL HIGH

## 2018-02-07 LAB — CYCLIC CITRUL PEPTIDE ANTIBODY, IGG: Cyclic Citrullin Peptide Ab: <16 UNITS

## 2018-02-07 NOTE — Telephone Encounter (Signed)
LMOM for patient to call and schedule Synvisc injections.

## 2018-02-08 ENCOUNTER — Encounter: Payer: Self-pay | Admitting: Internal Medicine

## 2018-02-08 NOTE — Progress Notes (Signed)
14-3-3 eta negative.

## 2018-02-11 ENCOUNTER — Other Ambulatory Visit: Payer: Self-pay | Admitting: *Deleted

## 2018-02-11 ENCOUNTER — Ambulatory Visit: Payer: Medicare Other | Admitting: Physician Assistant

## 2018-02-11 DIAGNOSIS — M170 Bilateral primary osteoarthritis of knee: Secondary | ICD-10-CM | POA: Diagnosis not present

## 2018-02-11 MED ORDER — LIDOCAINE HCL 1 % IJ SOLN
1.5000 mL | INTRAMUSCULAR | Status: AC | PRN
  Administered 2018-02-11: 1.5 mL

## 2018-02-11 MED ORDER — LIDOCAINE 5 % EX PTCH: 1.0000 | MEDICATED_PATCH | CUTANEOUS | 1 refills | Status: DC

## 2018-02-11 MED ORDER — HYLAN G-F 20 16 MG/2ML IX SOSY
16.0000 mg | PREFILLED_SYRINGE | INTRA_ARTICULAR | Status: AC | PRN
  Administered 2018-02-11: 16 mg via INTRA_ARTICULAR

## 2018-02-11 NOTE — Telephone Encounter (Signed)
Per Hazel Sams, PA-C patient requested a prescription of Lidoderm patches sent to the pharmacy. Okay to fill per Hazel Sams, PA-C.

## 2018-02-11 NOTE — Progress Notes (Signed)
   Procedure Note  Patient: [name]             Date of Birth: [birth year]           MRN: [id number]             Visit Date: 02/11/2018  Procedures: Visit Diagnoses: Primary osteoarthritis of both knees - Plan: Large Joint Inj: bilateral knee Synvisc #1 bilateral knee joint injections B/B Large Joint Inj: bilateral knee on 02/11/2018 9:34 AM Indications: pain Details: 25 G 1.5 in needle, medial approach  Arthrogram: No  Medications (Right): 16 mg Hylan 16 MG/2ML; 1.5 mL lidocaine 1 % Aspirate (Right): 0 mL Medications (Left): 16 mg Hylan 16 MG/2ML; 1.5 mL lidocaine 1 % Aspirate (Left): 0 mL Outcome: tolerated well, no immediate complications Procedure, treatment alternatives, risks and benefits explained, specific risks discussed. Consent was given by the patient. Immediately prior to procedure a time out was called to verify the correct patient, procedure, equipment, support staff and site/side marked as required. Patient was prepped and draped in the usual sterile fashion.     Patient tolerated the procedure well.  Hazel Sams, PA-C  She requested a refill of lidoderm patches.  She was given a printout of recent lab work, which was discussed with her in the office today.

## 2018-02-19 ENCOUNTER — Ambulatory Visit: Payer: Medicare Other | Admitting: Physician Assistant

## 2018-02-22 ENCOUNTER — Ambulatory Visit: Payer: Medicare Other | Admitting: Rheumatology

## 2018-02-22 ENCOUNTER — Encounter: Payer: Self-pay | Admitting: Rheumatology

## 2018-02-22 ENCOUNTER — Ambulatory Visit (INDEPENDENT_AMBULATORY_CARE_PROVIDER_SITE_OTHER): Payer: Medicare Other

## 2018-02-22 ENCOUNTER — Telehealth: Payer: Self-pay | Admitting: Rheumatology

## 2018-02-22 VITALS — BP 124/89 | HR 99 | Resp 15 | Ht 64.0 in | Wt 230.0 lb

## 2018-02-22 DIAGNOSIS — F329 Major depressive disorder, single episode, unspecified: Secondary | ICD-10-CM

## 2018-02-22 DIAGNOSIS — Z8709 Personal history of other diseases of the respiratory system: Secondary | ICD-10-CM

## 2018-02-22 DIAGNOSIS — M545 Low back pain, unspecified: Secondary | ICD-10-CM

## 2018-02-22 DIAGNOSIS — M5136 Other intervertebral disc degeneration, lumbar region: Secondary | ICD-10-CM

## 2018-02-22 DIAGNOSIS — I341 Nonrheumatic mitral (valve) prolapse: Secondary | ICD-10-CM

## 2018-02-22 DIAGNOSIS — F419 Anxiety disorder, unspecified: Secondary | ICD-10-CM

## 2018-02-22 DIAGNOSIS — I10 Essential (primary) hypertension: Secondary | ICD-10-CM

## 2018-02-22 DIAGNOSIS — G4709 Other insomnia: Secondary | ICD-10-CM | POA: Diagnosis not present

## 2018-02-22 DIAGNOSIS — Z8719 Personal history of other diseases of the digestive system: Secondary | ICD-10-CM

## 2018-02-22 DIAGNOSIS — M797 Fibromyalgia: Secondary | ICD-10-CM | POA: Diagnosis not present

## 2018-02-22 DIAGNOSIS — Z8669 Personal history of other diseases of the nervous system and sense organs: Secondary | ICD-10-CM

## 2018-02-22 DIAGNOSIS — Z8639 Personal history of other endocrine, nutritional and metabolic disease: Secondary | ICD-10-CM

## 2018-02-22 DIAGNOSIS — M170 Bilateral primary osteoarthritis of knee: Secondary | ICD-10-CM

## 2018-02-22 DIAGNOSIS — D573 Sickle-cell trait: Secondary | ICD-10-CM

## 2018-02-22 DIAGNOSIS — R5383 Other fatigue: Secondary | ICD-10-CM

## 2018-02-22 DIAGNOSIS — D751 Secondary polycythemia: Secondary | ICD-10-CM

## 2018-02-22 MED ORDER — LIDOCAINE HCL 1 % IJ SOLN
1.5000 mL | INTRAMUSCULAR | Status: AC | PRN
  Administered 2018-02-22: 1.5 mL

## 2018-02-22 MED ORDER — HYLAN G-F 20 16 MG/2ML IX SOSY
16.0000 mg | PREFILLED_SYRINGE | INTRA_ARTICULAR | Status: AC | PRN
  Administered 2018-02-22: 16 mg via INTRA_ARTICULAR

## 2018-02-22 NOTE — Telephone Encounter (Signed)
Patient called stating [facility] needs additional information from Dr. Estanislado Pandy before they can fill the prescription of Lidocaine patches.  Patient requested a return call.

## 2018-02-22 NOTE — Progress Notes (Signed)
   Procedure Note  Patient: [name]             Date of Birth: [birth year]           MRN: [id number]             Visit Date: 02/22/2018  Procedures: Visit Diagnoses: Primary osteoarthritis of both knees Synvisc #2 bilateral B/B Large Joint Inj: bilateral knee on 02/22/2018 10:47 AM Indications: pain Details: 25 G 1.5 in needle, medial approach  Arthrogram: No  Medications (Right): 16 mg Hylan 16 MG/2ML; 1.5 mL lidocaine 1 % Aspirate (Right): 0 mL Medications (Left): 16 mg Hylan 16 MG/2ML; 1.5 mL lidocaine 1 % Aspirate (Left): 0 mL Outcome: tolerated well, no immediate complications Procedure, treatment alternatives, risks and benefits explained, specific risks discussed. Consent was given by the patient. Immediately prior to procedure a time out was called to verify the correct patient, procedure, equipment, support staff and site/side marked as required. Patient was prepped and draped in the usual sterile fashion.     Patient tolerated the procedure well.  Hazel Sams, PA-C

## 2018-02-22 NOTE — Progress Notes (Signed)
Office Visit Note  Patient: [name]             Date of Birth: 06-12-1950           MRN: [id number]             PCP: Biagio Borg, MD Referring: Biagio Borg, MD Visit Date: 02/22/2018 Occupation: _0 @  Subjective:  Low back pain   History of Present Illness: Graciana Sessa is a 68 y.o. female with history of osteoarthritis, fibromyalgia, and DDD.  She reports she has chronic lower back pain that was exacerbated by bending down on Monday.  She states she heard a "pop" and has discomfort since.  She states the pain is radiating around to her groin. She has not tried using a heating pad or lidoderm patches yet.  She has been having muscle spasms in the lower back. She reports she continues to have bilateral knee joint pain.  She walks with a cane.  She had the second synvisc injection today. She has noticed some improvement since starting the series.  She reports pain in the right shoulder.  She generalized muscle aches and muscle tenderness due to fibromyalgia.  Activities of Daily Living:  Patient reports morning stiffness for 2  hours.   Patient Reports nocturnal pain.  Difficulty dressing/grooming: Denies Difficulty climbing stairs: Reports Difficulty getting out of chair: Reports Difficulty using hands for taps, buttons, cutlery, and/or writing: Denies  Review of Systems  Constitutional: Positive for fatigue.  HENT: Positive for mouth dryness. Negative for mouth sores and nose dryness.   Eyes: Positive for dryness. Negative for pain and visual disturbance.  Respiratory: Negative for cough, hemoptysis, shortness of breath and difficulty breathing.   Cardiovascular: Negative for chest pain, palpitations, hypertension and swelling in legs/feet.  Gastrointestinal: Positive for constipation. Negative for blood in stool and diarrhea.  Endocrine: Negative for increased urination.  Genitourinary: Negative for painful urination.  Musculoskeletal: Positive  for arthralgias, joint pain, myalgias, morning stiffness, muscle tenderness and myalgias. Negative for joint swelling and muscle weakness.  Skin: Negative for color change, pallor, rash, hair loss, nodules/bumps, skin tightness, ulcers and sensitivity to sunlight.  Allergic/Immunologic: Negative for susceptible to infections.  Neurological: Positive for headaches. Negative for dizziness, numbness and weakness.  Hematological: Negative for swollen glands.  Psychiatric/Behavioral: Negative for depressed mood and sleep disturbance. The patient is not nervous/anxious.     PMFS History:  Patient Active Problem List   Diagnosis Date Noted  . RLQ abdominal pain 07/18/2017  . Hyperglycemia 07/18/2017  . Right lumbar radiculopathy 05/17/2017  . Abdominal pain, epigastric 05/17/2017  . Hematoma 05/17/2017  . Right leg pain 11/09/2016  . Left ear hearing loss 11/09/2016  . Right hip pain 03/17/2016  . Right groin pain 08/19/2015  . Diarrhea 07/22/2015  . Right low back pain 07/22/2015  . Left lower quadrant pain 08/21/2014  . Bloating 08/21/2014  . Nausea without vomiting 08/21/2014  . Panniculitis 06/12/2014  . Genetic testing 06/04/2014  . Family history of breast cancer 06/04/2014  . Peripheral edema 04/29/2014  . Bilateral leg edema 04/15/2014  . Mouth pain 03/13/2014  . Pain of right great toe 03/13/2014  . COPD exacerbation ([place]) 02/06/2014  . Shingles outbreak 12/30/2013  . Thrush 12/30/2013  . Trigger point of thoracic region 12/30/2013  . Migraine headache 11/13/2013  . LUQ pain 05/08/2013  . Polycythemia, secondary 09/22/2012  . Chest pain 03/27/2012  . Cervical dysplasia   . Spell of dizziness 10/14/2011  .  Bruising 10/14/2011  . High risk medication use 10/14/2011  . Lesion of mouth 05/30/2011  . Chronic sinusitis 05/30/2011  . Encounter for preventative adult health care exam with abnormal findings 02/25/2011  . Impaired glucose tolerance 02/25/2011  . Endometriosis    . OTHER&UNSPECIFIED DISEASES THE ORAL SOFT TISSUES 03/30/2010  . Family hx of colon cancer 03/11/2010  . THYROID NODULE, RIGHT 03/01/2010  . BACK PAIN 03/01/2010  . GOITER 10/26/2008  . CONSTIPATION 11/18/2007  . LUMBAR RADICULOPATHY, LEFT 11/07/2007  . Hypothyroidism 05/31/2007  . ANXIETY 05/31/2007  . ASTHMA 05/31/2007  . COPD (chronic obstructive pulmonary disease) ([place]) 05/31/2007  . COLONIC POLYPS, HX OF 05/31/2007  . HYPERLIPIDEMIA 09/22/2006  . Obesity 09/22/2006  . SICKLE CELL TRAIT 09/22/2006  . Depression 09/22/2006  . OSA (obstructive sleep apnea) 09/22/2006  . Essential hypertension 09/22/2006  . VENOUS INSUFFICIENCY 09/22/2006  . ALLERGIC RHINITIS 09/22/2006  . GERD 09/22/2006  . PEPTIC ULCER DISEASE 09/22/2006  . Irritable bowel syndrome 09/22/2006  . OSTEOARTHRITIS 09/22/2006  . FIBROMYALGIA 09/22/2006  . OSTEOPOROSIS 09/22/2006  . FATIGUE, CHRONIC 09/22/2006  . PERIPHERAL EDEMA 09/22/2006  . MITRAL VALVE PROLAPSE, HX OF 09/22/2006    Past Medical History:  Diagnosis Date  . Anxiety   . Arthritis   . Asthma   . Asthma   . Biliary dyskinesia   . Bowel obstruction ([place])   . BRCA negative 02/2013  . Cervical dysplasia   . Complication of anesthesia    "I have the shakes"  . Depression   . Diverticulosis   . Elevated cholesterol   . Endometriosis   . Fibromyalgia   . GERD (gastroesophageal reflux disease)   . Heart murmur    mvp  . Hx of oral aphthous ulcers   . Hypertension   . Hypertension    no med  . Hypothyroidism    hx nodule on thyroid   . Impaired glucose tolerance 02/25/2011  . Low back pain   . Migraine   . Migraines   . Nocturia   . Polycythemia vera ([place])   . Shingles   . Shortness of breath    with exertion  . Sickle cell trait ([place])   . Sleep apnea    has not used c-pap x 1 yr. use mouthguard now  . Thyroid disease   . Thyroid disease    Hypothyroid    Family History  Problem Relation Age of Onset  . Hypertension Mother    . Heart disease Mother   . Breast cancer Mother        Age 52  . Bone cancer Mother   . Hypertension Father   . Heart disease Father   . Diabetes Father   . Colon cancer Sister 6  . Sarcoidosis Sister   . Breast cancer Sister 79  . Breast cancer Maternal Grandmother        Age 15  . Ovarian cancer Paternal Grandmother   . Breast cancer Paternal Grandmother        Age unknown  . Sarcoidosis Brother   . Breast cancer Maternal Aunt        Age 78's  . Breast cancer Maternal Aunt        Age 18  . Heart failure Father   . Arthritis Father   . Colon cancer Sister   . Sarcoidosis Sister   . Multiple sclerosis Cousin    Past Surgical History:  Procedure Laterality Date  . ABDOMINAL HYSTERECTOMY    . BREAST  EXCISIONAL BIOPSY Left 1999  . BREAST SURGERY     Cystectomy on Left breast  . BREAST SURGERY     Benign breast cyst  . CHOLECYSTECTOMY    . CHOLECYSTECTOMY  12/05/2011   Procedure: LAPAROSCOPIC CHOLECYSTECTOMY;  Surgeon: Ralene Ok, MD;  Location: [facility];  Service: General;  Laterality: N/A;  . COLPOSCOPY    . GYNECOLOGIC CRYOSURGERY    . HERNIA REPAIR     abdomen  . JOINT REPLACEMENT Right    hip  . OOPHORECTOMY     BSO  . PANNICULECTOMY N/A 06/12/2014   Procedure: PANNICULECTOMY;  Surgeon: Irene Limbo, MD;  Location: [facility];  Service: Plastics;  Laterality: N/A;  . PELVIC LAPAROSCOPY  1995,2002   DL LSO DL RSO  . TONSILLECTOMY    . TOTAL HIP ARTHROPLASTY     Right  . VAGINAL HYSTERECTOMY  1983   Social History   Social History Narrative   Lives at home alone.   Right-handed.   6 cups per week.   Immunization History  Administered Date(s) Administered  . Influenza Split 10/27/2011  . Influenza Whole 10/26/2008  . Influenza-Unspecified 09/23/2012, 10/23/2017  . Pneumococcal Polysaccharide-23 01/24/2004     Objective: Vital Signs: BP 124/89 (BP Location: Left Wrist, Patient Position: Sitting, Cuff Size: Normal)   Pulse 99    Resp 15   Ht _0  (1.626 m)   Wt 230 lb (104.3 kg)   BMI 39.48 kg/m    Physical Exam Vitals signs and nursing note reviewed.  Constitutional:      Appearance: She is well-developed.  HENT:     Head: Normocephalic and atraumatic.  Eyes:     Conjunctiva/sclera: Conjunctivae normal.  Neck:     Musculoskeletal: Normal range of motion.  Cardiovascular:     Rate and Rhythm: Normal rate and regular rhythm.     Heart sounds: Normal heart sounds.  Pulmonary:     Effort: Pulmonary effort is normal.     Breath sounds: Normal breath sounds.  Abdominal:     General: Bowel sounds are normal.     Palpations: Abdomen is soft.  Lymphadenopathy:     Cervical: No cervical adenopathy.  Skin:    General: Skin is warm and dry.     Capillary Refill: Capillary refill takes less than 2 seconds.  Neurological:     Mental Status: She is alert and oriented to person, place, and time.  Psychiatric:        Behavior: Behavior normal.      Musculoskeletal Exam: C-spine, thoracic spine and lumbar spine limited ROM. Midline spinal tenderness in the lumbar region.  No SI joint tenderness.  Right shoulder discomfort with ROM.  Left shoulder full ROM.  Elbow joints, wrist joints, MCPs and PIPs, DIPs good range of motion with no synovitis.  She has complete fist formation bilaterally.  Hip joints, knee joints, ankle joints, MTPs, PIPs, DIPs good range of motion with no synovitis.  No warmth or effusion of knee joints.  Pes planus bilaterally.  Pedal edema noted.  CDAI Exam: CDAI Score: Not documented Patient Global Assessment: Not documented; Provider Global Assessment: Not documented Swollen: Not documented; Tender: Not documented Joint Exam   Not documented   There is currently no information documented on the homunculus. Go to the Rheumatology activity and complete the homunculus joint exam.  Investigation: No additional findings.  Imaging: Xr Lumbar Spine 2-3 Views  Result Date:  02/22/2018 Dextroscoliosis was noted.  No SI joint sclerosis  was noted.  Significant disc space narrowing was noted.  Anterior spurring was noted.  Some facet joint arthropathy was noted. Impression: These findings are consistent mild spondylosis and facet joint arthropathy.   Recent Labs: Lab Results  Component Value Date   WBC 10.0 07/18/2017   HGB 16.0 (H) 07/18/2017   PLT 335.0 07/18/2017   NA 140 07/18/2017   K 3.9 07/18/2017   CL 102 07/18/2017   CO2 34 (H) 07/18/2017   GLUCOSE 103 (H) 07/18/2017   BUN 11 07/18/2017   CREATININE 0.96 07/18/2017   BILITOT 0.4 07/18/2017   ALKPHOS 67 07/18/2017   AST 14 07/18/2017   ALT 11 07/18/2017   PROT 7.5 07/18/2017   ALBUMIN 3.8 07/18/2017   CALCIUM 9.2 07/18/2017   GFRAA >60 09/05/2015    Speciality Comments: No specialty comments available.  Procedures:  No procedures performed Allergies: Celecoxib; Codeine; Codeine; Duloxetine; Erythromycin; Escitalopram oxalate; Ezetimibe; Gluten meal; Lactose intolerance (gi); Lyrica [pregabalin]; Milnacipran; Pregabalin; Rofecoxib; Statins; Statins; Tramadol; Ultram [tramadol hcl]; Celebrex [celecoxib]; and Contrast media [iodinated diagnostic agents]   Assessment / Plan:     Visit Diagnoses: Primary osteoarthritis of both knees - No warmth or effusion.  She has good ROM with some discomfort.  She has bilateral knee joint crepitus.  She recieved the second injection of the Synvisc series. She tolerated the procedure well. She uses voltaren gel topically for pain relief. - Plan: Large Joint Inj: bilateral knee  Acute midline low back pain without sciatica -She is been experiencing muscle spasms in her lower back since Monday after bending down and injuring her lower back.  She has midline spinal tenderness in the lumbar region.  x-ray of the lumbar spine was obtained today consistent with DDD.  She was encouraged to rest, use a heating pad, and Lidoderm patches as needed.  She was given a handout of  back exercises which she will start performing next week.  She was advised to notify us if she develops any new or worsening symptoms.  She will follow-up in 1 week for her third Synvisc injection and we will assess how she is doing at that point.  We discussed that if she develops numbness, tingling, or radiating pain she may require an MRI in the future.  Plan: XR Lumbar Spine 2-3 Views  Fibromyalgia: She continues have generalized muscle aches muscle tenderness due to fibromyalgia.  She will be picking up the prescription for Lidoderm patches today.  She is been having muscle spasms in her lower back since injuring her back on Monday.  She continues have chronic fatigue and insomnia.  She had interrupted sleep at night due to discomfort has been experiencing in the lumbar spine.  She was encouraged to perform the back exercises were provided in the handout.  DDD (degenerative disc disease), lumbar - She has chronic pain.  She has midline spinal tenderness in the lumbar region.  On Monday she bent down to pick something up and heard a "pop" in her lower back and started having radiating pain wrap around to her abdomen.  She continues to have disocmfort and pain at night.  She denies any numbness or tingling or bowel/bladder dysfunction . She has not tried lidoderm patches or a heating pad.  She is planning on using lidoderm patches.    Other insomnia: She has interrupted sleep at night.   Other fatigue: She has chronic fatigue.   Other medical conditions are listed as follows:   History of gastroesophageal reflux (GERD)  Essential hypertension  MVP (mitral valve prolapse)  Hx of migraines  Anxiety and depression  History of COPD  History of hypothyroidism  History of sleep apnea  Polycythemia, secondary  History of asthma  History of diverticulosis  Sickle cell trait ([place])  History of IBS   Orders: Orders Placed This Encounter  Procedures  . Large Joint Inj: bilateral knee   . XR Lumbar Spine 2-3 Views   No orders of the defined types were placed in this encounter.   Face-to-face time spent with patient was 30 minutes. Greater than 50% of time was spent in counseling and coordination of care.  Follow-Up Instructions: Return in about 6 months (around 08/23/2018) for Fibromyalgia, Osteoarthritis, DDD.   Ofilia Neas, PA-C  I examined and evaluated the patient with Hazel Sams PA.  Patient reports back discomfort and popping in her back.  Her x-ray of the lumbar spine were consistent with facet joint arthropathy.  Weight loss and back exercises were discussed and demonstrated.  Handout was given.  The plan of care was discussed as noted above.  Bo Merino, MD  Note - This record has been created using Editor, commissioning.  Chart creation errors have been sought, but may not always  have been located. Such creation errors do not reflect on  the standard of medical care.

## 2018-02-26 ENCOUNTER — Ambulatory Visit: Payer: Medicare Other | Admitting: Physician Assistant

## 2018-02-28 NOTE — Telephone Encounter (Signed)
Prior authorization submitted via cover my meds. Will update once response received. Patient advised.

## 2018-03-03 ENCOUNTER — Other Ambulatory Visit: Payer: Self-pay | Admitting: Internal Medicine

## 2018-03-04 ENCOUNTER — Ambulatory Visit: Payer: Medicare Other | Admitting: Physician Assistant

## 2018-03-04 DIAGNOSIS — M170 Bilateral primary osteoarthritis of knee: Secondary | ICD-10-CM

## 2018-03-04 MED ORDER — LIDOCAINE HCL 1 % IJ SOLN
1.5000 mL | INTRAMUSCULAR | Status: AC | PRN
  Administered 2018-03-04: 1.5 mL

## 2018-03-04 MED ORDER — HYLAN G-F 20 16 MG/2ML IX SOSY
16.0000 mg | PREFILLED_SYRINGE | INTRA_ARTICULAR | Status: AC | PRN
  Administered 2018-03-04: 16 mg via INTRA_ARTICULAR

## 2018-03-04 MED ORDER — LIDOCAINE 5 % EX PTCH: 1.0000 | MEDICATED_PATCH | CUTANEOUS | 1 refills | Status: DC

## 2018-03-04 NOTE — Addendum Note (Signed)
Addended by: Carole Binning on: 03/04/2018 02:58 PM   Modules accepted: Orders

## 2018-03-04 NOTE — Progress Notes (Signed)
   Procedure Note  Patient: [name]             Date of Birth: 1950/09/10           MRN: [id number]             Visit Date: 03/04/2018  Procedures: Visit Diagnoses: Primary osteoarthritis of both knees - Plan: Large Joint Inj: bilateral knee Synvisc #3 bilateral knee joint injections  Large Joint Inj: bilateral knee on 03/04/2018 8:51 AM Indications: pain Details: 25 G 1.5 in needle, medial approach  Arthrogram: No  Medications (Right): 16 mg Hylan 16 MG/2ML; 1.5 mL lidocaine 1 % Aspirate (Right): 0 mL Medications (Left): 16 mg Hylan 16 MG/2ML; 1.5 mL lidocaine 1 % Aspirate (Left): 0 mL Outcome: tolerated well, no immediate complications Procedure, treatment alternatives, risks and benefits explained, specific risks discussed. Consent was given by the patient. Immediately prior to procedure a time out was called to verify the correct patient, procedure, equipment, support staff and site/side marked as required. Patient was prepped and draped in the usual sterile fashion.     Patient tolerated the procedure well.   Hazel Sams, PA-C

## 2018-03-04 NOTE — Telephone Encounter (Signed)
Patient advised prior authorization has been denied. Patient asked for prescription to be sent to [facility] on [address]. Patient will get with good Rx coupon.

## 2018-03-29 ENCOUNTER — Other Ambulatory Visit: Payer: Self-pay | Admitting: Internal Medicine

## 2018-03-29 MED ORDER — ALBUTEROL SULFATE HFA 108 (90 BASE) MCG/ACT IN AERS: 2.0000 | INHALATION_SPRAY | Freq: Four times a day (QID) | RESPIRATORY_TRACT | 11 refills | Status: AC | PRN

## 2018-03-29 NOTE — Telephone Encounter (Signed)
Requested medication (s) are due for refill today: yes  Requested medication (s) are on the active medication list: yes  Last refill:  02/06/14 3.7 11 RF rx expired  Future visit scheduled: no  Notes to clinic:  Sending back to office to review.   Requested Prescriptions  Pending Prescriptions Disp Refills   albuterol (PROVENTIL HFA;VENTOLIN HFA) 108 (90 Base) MCG/ACT inhaler 3.7 g 11    Sig: Inhale 2 puffs into the lungs every 6 (six) hours as needed for wheezing. For wheezing     Pulmonology:  Beta Agonists Failed - 03/29/2018  8:34 AM      Failed - One inhaler should last at least one month. If the patient is requesting refills earlier, contact the patient to check for uncontrolled symptoms.      Passed - Valid encounter within last 12 months    Recent Outpatient Visits          8 months ago RLQ abdominal pain   [place] [facility], James W, MD   10 months ago Encounter for preventative adult health care exam with abnormal findings   [place] [facility], James W, MD   1 year ago Lower abdominal pain   [facility] -Georges Mouse, MD   1 year ago Hypothyroidism, unspecified type   [place], James W, MD   2 years ago Right groin pain   [place], FNP      Future Appointments            In 4 months [place] [facility]

## 2018-03-29 NOTE — Telephone Encounter (Signed)
Copied from [place] [id number]. Topic: Quick Communication - Rx Refill/Question >> Mar 29, 2018  8:20 AM Burchel, Abbi R wrote: Medication: albuterol (PROVENTIL HFA;VENTOLIN HFA) 108 (90 BASE) MCG/ACT inhaler    Preferred Pharmacy: [facility] [place], [address] [place] [address] Phone: [phone] Fax: [phone]   Pt was advised that RX refills may take up to 3 business days. We ask that you follow-up with your pharmacy.

## 2018-04-04 ENCOUNTER — Other Ambulatory Visit: Payer: Self-pay | Admitting: *Deleted

## 2018-04-04 DIAGNOSIS — Z8739 Personal history of other diseases of the musculoskeletal system and connective tissue: Secondary | ICD-10-CM

## 2018-04-09 ENCOUNTER — Encounter: Payer: Self-pay | Admitting: Gynecology

## 2018-04-09 ENCOUNTER — Other Ambulatory Visit: Payer: Self-pay

## 2018-04-09 ENCOUNTER — Ambulatory Visit: Payer: Medicare Other | Admitting: Gynecology

## 2018-04-09 VITALS — BP 134/80

## 2018-04-09 DIAGNOSIS — R102 Pelvic and perineal pain: Secondary | ICD-10-CM | POA: Diagnosis not present

## 2018-04-09 MED ORDER — ESTRADIOL ACETATE 0.05 MG/24HR VA RING: 1.0000 | VAGINAL_RING | VAGINAL | 0 refills | Status: DC

## 2018-04-09 NOTE — Patient Instructions (Signed)
Follow-up for the ultrasound as scheduled.  Follow-up with your primary physician/emergency room in reference to the chest pain.

## 2018-04-09 NOTE — Progress Notes (Signed)
    [name] Jun 11, 1950 [id number]        68 y.o.  [id number] presents with 2 issues:  1. Awakening with chest pain left mid chest this morning.  Has persisted on and off since.  Dull ache increased with deep breath.  No radiation in the neck or arm.  No nausea vomiting or lightheadedness.  No history of cardiac issues in the past.  Has had similar episodes in the past. 2. Lower abdominal pain on and off for months.  Both right and left side.  Some constipation.  No diarrhea.  No urinary symptoms such as frequency, dysuria, urgency.  No fever or chills.  CT scan 06/2017 showed diverticulosis.  Left adrenal adenoma and fat-containing umbilical hernia.  Status post hysterectomy with BSO in the past for endometriosis.  Using Femring 0.1 mg now.  Past medical history,surgical history, problem list, medications, allergies, family history and social history were all reviewed and documented in the EPIC chart.  Directed ROS with pertinent positives and negatives documented in the history of present illness/assessment and plan.  Exam: [name] assistant Vitals:   04/09/18 1124  BP: 134/80   General appearance:  Normal in no apparent distress HEENT normal Lungs clear Cardiac regular rate without rubs murmurs or gallops.  Pulse rate 84 Abdomen soft nontender without masses guarding rebound Pelvic external BUS vagina with atrophic changes.  Bimanual without masses or tenderness.  Rectal exam is normal  Assessment/Plan:  68 y.o. [id number] with:  1. Dull chest pain increased with deep breaths.  History of similar in the past.  Reviewed differential to include cardiac.  Recommended she follow-up either with the emergency room today versus calling her primary doctor today and follow his direction.  The patient agrees to follow-up with this. 2. Lower abdominal pain.  Intermittent on and off for months.  CT scan within the past year negative for pelvic pathology other than showing  diverticulosis without diverticulitis.  Status post hysterectomy BSO in the past.  Reviewed differential to include musculoskeletal, GI, GU and GYN.  As she has no GYN organs highly doubt related.  She does have a history of endometriosis and is on Femring 0.1 mg.  Will check baseline ultrasound to rule out gross pelvic pathology.  Also recommended decreasing Femring to .05 mg and then follow-up with GI assuming ultrasound is negative.  Check baseline urine and now assist today.    Anastasio Auerbach MD, 12:07 PM 04/09/2018

## 2018-04-11 LAB — URINE CULTURE
MICRO NUMBER:: 332440
SPECIMEN QUALITY:: ADEQUATE

## 2018-04-11 LAB — URINALYSIS, COMPLETE W/RFL CULTURE
BILIRUBIN URINE: NEGATIVE
Bacteria, UA: NONE SEEN /HPF
Glucose, UA: NEGATIVE
Hgb urine dipstick: NEGATIVE
Leukocyte Esterase: NEGATIVE
Nitrites, Initial: NEGATIVE
Protein, ur: NEGATIVE
Specific Gravity, Urine: 1.018 (ref 1.001–1.03)
pH: <=5 (ref 5.0–8.0)

## 2018-04-11 LAB — CULTURE INDICATED

## 2018-05-01 ENCOUNTER — Other Ambulatory Visit: Payer: Medicare Other

## 2018-05-01 ENCOUNTER — Ambulatory Visit: Payer: Medicare Other | Admitting: Gynecology

## 2018-05-01 ENCOUNTER — Encounter: Payer: Self-pay | Admitting: Internal Medicine

## 2018-05-16 ENCOUNTER — Other Ambulatory Visit: Payer: Self-pay

## 2018-05-20 ENCOUNTER — Ambulatory Visit: Payer: Medicare Other | Admitting: Gynecology

## 2018-05-20 ENCOUNTER — Encounter: Payer: Self-pay | Admitting: Gynecology

## 2018-05-20 ENCOUNTER — Telehealth: Payer: Self-pay

## 2018-05-20 ENCOUNTER — Other Ambulatory Visit: Payer: Self-pay

## 2018-05-20 VITALS — BP 118/70 | Ht 64.0 in | Wt 228.0 lb

## 2018-05-20 DIAGNOSIS — Z01419 Encounter for gynecological examination (general) (routine) without abnormal findings: Secondary | ICD-10-CM

## 2018-05-20 DIAGNOSIS — M8580 Other specified disorders of bone density and structure, unspecified site: Secondary | ICD-10-CM

## 2018-05-20 DIAGNOSIS — N952 Postmenopausal atrophic vaginitis: Secondary | ICD-10-CM

## 2018-05-20 DIAGNOSIS — Z79890 Hormone replacement therapy (postmenopausal): Secondary | ICD-10-CM | POA: Diagnosis not present

## 2018-05-20 DIAGNOSIS — N898 Other specified noninflammatory disorders of vagina: Secondary | ICD-10-CM

## 2018-05-20 LAB — WET PREP FOR TRICH, YEAST, CLUE

## 2018-05-20 MED ORDER — ESTRADIOL 0.05 MG/24HR TD PTTW: 1.0000 | MEDICATED_PATCH | TRANSDERMAL | 12 refills | Status: DC

## 2018-05-20 NOTE — Progress Notes (Signed)
[name] 01/01/1951 [id number]        68 y.o.  [id number] for annual gynecologic exam.  Also complaining of chronic vaginal discharge that comes and goes.  Has a history of yeast infections in the past.  Is using Femring 0.05 and took it out a week ago.  Now is having significant hot flushes and sweats.  Past medical history,surgical history, problem list, medications, allergies, family history and social history were all reviewed and documented as reviewed in the EPIC chart.  ROS:  Performed with pertinent positives and negatives included in the history, assessment and plan.   Additional significant findings : None   Exam: [name] assistant Vitals:   05/20/18 1211  BP: 118/70  Weight: 228 lb (103.4 kg)  Height: 5\' 4"  (1.626 m)   Body mass index is 39.14 kg/m.  General appearance:  Normal affect, orientation and appearance. Skin: Grossly normal HEENT: Without gross lesions.  No cervical or supraclavicular adenopathy. Thyroid normal.  Lungs:  Clear without wheezing, rales or rhonchi Cardiac: RR, without RMG Abdominal:  Soft, nontender, without masses, guarding, rebound, organomegaly or hernia Breasts:  Examined lying and sitting without masses, retractions, discharge or axillary adenopathy. Pelvic:  Ext, BUS, Vagina: With atrophic changes.  Slight white discharge noted.  Adnexa: Without masses or tenderness    Anus and perineum: Normal   Rectovaginal: Normal sphincter tone without palpated masses or tenderness.    Assessment/Plan:  68 y.o. [id number] female for annual gynecologic exam.  Status post hysterectomy with subsequent separate RSO and LSO for endometriosis.  1. Vaginal discharge.  Wet prep was negative.  I suspect she is having some issues with the ring precipitating the discharge and may be a recurrence and a vaginitis.  No overt pathogen on wet prep today.  Recommended she discontinue Femring and started on the estrogen patch as discussed below.  We  will limited to [place] as a source of her vaginitis and then will readdress if she continues to have symptoms. 2. HRT.  Is having unacceptable hot flushes and sweats.  We again discussed the issues of HRT to include risks of thrombosis such as stroke heart attack DVT.  We also discussed her family history of breast cancer although she relates being screened genetically and was told she was negative.  Benefits as far as symptom relief reviewed.  At this point she wants to continue and I suggested starting on the patch to help decrease thrombosis risk.  We will start with Vivelle 0.05 patch and we will see how she does with this. 3. History of lower abdominal pain.  Intermittent over the last several months as noted in her 04/09/2018 note.  She has an ultrasound scheduled in May and she is going to follow-up for this.  She will follow-up with GI for further evaluation of the pain.  She does have a history of CT scan within the past year showing a normal abdomen/pelvis with the exception of diverticulosis. 4. History of osteopenia on DEXA 2012.  Follow-up DEXA 2015 was normal.  Plan repeat DEXA this coming year after COVID restrictions lifted.  Patient will schedule in follow-up for this. 5. Mammography 11/2017.  Continue with annual mammography when due.  SBE monthly reviewed. 6. Colonoscopy 2013.  Repeat at their recommended interval. 7. Pap smear 2017.  Pap smear done today.  Options to stop screening per current screening guidelines based on hysterectomy and age reviewed.  Will readdress on an annual basis.  She does have  a history of dysplasia from Dr. Valeta Harms previous notes. 8. Health maintenance.  No routine lab work done as patient does this elsewhere.   Anastasio Auerbach MD, 12:51 PM 05/20/2018

## 2018-05-20 NOTE — Telephone Encounter (Signed)
Patient said you changed her to twice weekly patch and it is going to be to expensive at $112.00.  She said she used to be on a once a week patch and that was affordable but it may just be the insurance has changed how they cover it.  She asked if another less expensive option.

## 2018-05-20 NOTE — Addendum Note (Signed)
Addended by: Nelva Nay on: 05/20/2018 01:03 PM   Modules accepted: Orders

## 2018-05-20 NOTE — Patient Instructions (Signed)
Follow-up for the ultrasound as scheduled.  Start on the new estrogen patch and follow-up if there is any issues with this.

## 2018-05-21 ENCOUNTER — Telehealth: Payer: Self-pay | Admitting: *Deleted

## 2018-05-21 LAB — PAP IG W/ RFLX HPV ASCU

## 2018-05-21 NOTE — Telephone Encounter (Signed)
Medication approved via [employer] until 01/23/2019. Pharmacy informed informed as well.

## 2018-05-21 NOTE — Telephone Encounter (Signed)
Check with pharmacy and what ever is the cheapest option for her in a 0.5 mg estradiol patch whether its weekly or twice weekly is okay

## 2018-05-21 NOTE — Telephone Encounter (Signed)
PA done via cover my meds for vivelle dot patch 0.05mg  patch will wait for response.

## 2018-05-22 ENCOUNTER — Ambulatory Visit: Payer: Medicare Other | Admitting: Physician Assistant

## 2018-05-22 NOTE — Telephone Encounter (Signed)
Pharmacy called back. He said that he was not sure what was relayed to patient that she understood.  He said after Prior Authorization was received the Rx will only cost her $10 copayment. He will contact her and let her know.

## 2018-05-22 NOTE — Telephone Encounter (Signed)
Left message with pharmacy to please check on this for me and let me know.

## 2018-06-03 NOTE — Progress Notes (Signed)
Office Visit Note  Patient: [name]             Date of Birth: 1950-05-18           MRN: [id number]             PCP: Biagio Borg, MD Referring: Biagio Borg, MD Visit Date: 06/04/2018 Occupation: '@GUAROCC'$ @  Subjective:  Right knee joint pain   History of Present Illness: Loreley Schwall is a 68 y.o. female with history of fibromyalgia and osteoarthritis.  She is having severe right knee joint pain for the past 1 month.  She states the pain became more severe starting 3 days ago.  She has noticed right knee joint swelling. She is having difficulty with mobility. She is using a cane for assistance. She had visco gel injections in January-early February.  She states in the past cortisone injections only last 2-3 days.  She has been alternating heat and ice and trying to rest of the right knee joint.  She uses voltaren gel topically prn for pain relief.  She states she continues to have right hip pain s/p right hip replacement.  She denies any other joint pain or joint swelling.    Activities of Daily Living:  Patient reports  joint stiffness all day.   Patient Reports nocturnal pain.  Difficulty dressing/grooming: Denies Difficulty climbing stairs: Reports Difficulty getting out of chair: Reports Difficulty using hands for taps, buttons, cutlery, and/or writing: Denies  Review of Systems  Constitutional: Positive for fatigue.  HENT: Positive for mouth dryness. Negative for mouth sores and nose dryness.   Eyes: Positive for dryness. Negative for pain and visual disturbance.  Respiratory: Positive for wheezing (HX of asthma). Negative for cough, hemoptysis, shortness of breath and difficulty breathing.   Cardiovascular: Negative for chest pain, palpitations, hypertension and swelling in legs/feet.  Gastrointestinal: Negative for blood in stool, constipation and diarrhea.  Endocrine: Negative for increased urination.  Genitourinary: Negative for painful urination.   Musculoskeletal: Positive for arthralgias, joint pain, joint swelling, myalgias, morning stiffness, muscle tenderness and myalgias. Negative for muscle weakness.  Skin: Negative for color change, pallor, rash, hair loss, nodules/bumps, skin tightness, ulcers and sensitivity to sunlight.  Allergic/Immunologic: Negative for susceptible to infections.  Neurological: Negative for dizziness, numbness, headaches and weakness.  Hematological: Negative for swollen glands.  Psychiatric/Behavioral: Negative for depressed mood and sleep disturbance. The patient is not nervous/anxious.     PMFS History:  Patient Active Problem List   Diagnosis Date Noted   RLQ abdominal pain 07/18/2017   Hyperglycemia 07/18/2017   Right lumbar radiculopathy 05/17/2017   Abdominal pain, epigastric 05/17/2017   Hematoma 05/17/2017   Right leg pain 11/09/2016   Left ear hearing loss 11/09/2016   Right hip pain 03/17/2016   Right groin pain 08/19/2015   Diarrhea 07/22/2015   Right low back pain 07/22/2015   Left lower quadrant pain 08/21/2014   Bloating 08/21/2014   Nausea without vomiting 08/21/2014   Panniculitis 06/12/2014   Genetic testing 06/04/2014   Family history of breast cancer 06/04/2014   Peripheral edema 04/29/2014   Bilateral leg edema 04/15/2014   Mouth pain 03/13/2014   Pain of right great toe 03/13/2014   COPD exacerbation ([place]) 02/06/2014   Shingles outbreak 12/30/2013   Thrush 12/30/2013   Trigger point of thoracic region 12/30/2013   Migraine headache 11/13/2013   LUQ pain 05/08/2013   Polycythemia, secondary 09/22/2012   Chest pain 03/27/2012   Cervical dysplasia  Spell of dizziness 10/14/2011   Bruising 10/14/2011   High risk medication use 10/14/2011   Lesion of mouth 05/30/2011   Chronic sinusitis 05/30/2011   Encounter for preventative adult health care exam with abnormal findings 02/25/2011   Impaired glucose tolerance 02/25/2011    Endometriosis    OTHER&UNSPECIFIED DISEASES THE ORAL SOFT TISSUES 03/30/2010   Family hx of colon cancer 03/11/2010   THYROID NODULE, RIGHT 03/01/2010   BACK PAIN 03/01/2010   GOITER 10/26/2008   CONSTIPATION 11/18/2007   LUMBAR RADICULOPATHY, LEFT 11/07/2007   Hypothyroidism 05/31/2007   ANXIETY 05/31/2007   ASTHMA 05/31/2007   COPD (chronic obstructive pulmonary disease) ([place]) 05/31/2007   COLONIC POLYPS, HX OF 05/31/2007   HYPERLIPIDEMIA 09/22/2006   Obesity 09/22/2006   SICKLE CELL TRAIT 09/22/2006   Depression 09/22/2006   OSA (obstructive sleep apnea) 09/22/2006   Essential hypertension 09/22/2006   VENOUS INSUFFICIENCY 09/22/2006   ALLERGIC RHINITIS 09/22/2006   GERD 09/22/2006   PEPTIC ULCER DISEASE 09/22/2006   Irritable bowel syndrome 09/22/2006   OSTEOARTHRITIS 09/22/2006   FIBROMYALGIA 09/22/2006   OSTEOPOROSIS 09/22/2006   FATIGUE, CHRONIC 09/22/2006   PERIPHERAL EDEMA 09/22/2006   MITRAL VALVE PROLAPSE, HX OF 09/22/2006    Past Medical History:  Diagnosis Date   Anxiety    Arthritis    Asthma    Asthma    Biliary dyskinesia    Bowel obstruction ([place])    BRCA negative 02/2013   Cervical dysplasia    Complication of anesthesia    "I have the shakes"   Depression    Diverticulosis    Elevated cholesterol    Endometriosis    Fibromyalgia    GERD (gastroesophageal reflux disease)    Heart murmur    mvp   Hx of oral aphthous ulcers    Hypertension    Hypertension    no med   Hypothyroidism    hx nodule on thyroid    Impaired glucose tolerance 02/25/2011   Low back pain    Migraine    Migraines    Nocturia    Polycythemia vera ([facility])    Shingles    Shortness of breath    with exertion   Sickle cell trait ([facility])    Sleep apnea    has not used c-pap x 1 yr. use mouthguard now   Thyroid disease    Thyroid disease    Hypothyroid    Family History  Problem Relation Age of Onset    Hypertension Mother    Heart disease Mother    Breast cancer Mother        Age 33   Bone cancer Mother    Hypertension Father    Heart disease Father    Diabetes Father    Colon cancer Sister 87   Sarcoidosis Sister    Breast cancer Sister 77   Breast cancer Maternal Grandmother        Age 24   Ovarian cancer Paternal Grandmother    Breast cancer Paternal 75        Age unknown   Sarcoidosis Brother    Breast cancer Maternal Aunt        Age 1's   Breast cancer Maternal Aunt        Age 69   Heart failure Father    Arthritis Father    Colon cancer Sister    Sarcoidosis Sister    Multiple sclerosis Cousin    Past Surgical History:  Procedure Laterality Date   ABDOMINAL  HYSTERECTOMY     BREAST EXCISIONAL BIOPSY Left 1999   BREAST SURGERY     Cystectomy on Left breast   BREAST SURGERY     Benign breast cyst   CHOLECYSTECTOMY     CHOLECYSTECTOMY  12/05/2011   Procedure: LAPAROSCOPIC CHOLECYSTECTOMY;  Surgeon: Ralene Ok, MD;  Location: [facility];  Service: General;  Laterality: N/A;   COLPOSCOPY     GYNECOLOGIC CRYOSURGERY     HERNIA REPAIR     abdomen   JOINT REPLACEMENT Right    hip   OOPHORECTOMY     BSO   PANNICULECTOMY N/A 06/12/2014   Procedure: PANNICULECTOMY;  Surgeon: Irene Limbo, MD;  Location: [facility];  Service: Plastics;  Laterality: N/A;   PELVIC LAPAROSCOPY  1995,2002   DL LSO DL RSO   TONSILLECTOMY     TOTAL HIP ARTHROPLASTY     Right   VAGINAL HYSTERECTOMY  1983   Social History   Social History Narrative   Lives at home alone.   Right-handed.   6 cups per week.   Immunization History  Administered Date(s) Administered   Influenza Split 10/27/2011   Influenza Whole 10/26/2008   Influenza-Unspecified 09/23/2012, 10/23/2017   Pneumococcal Polysaccharide-23 01/24/2004     Objective: Vital Signs: BP 139/90 (BP Location: Left Wrist, Patient Position: Sitting, Cuff Size:  Normal)    Pulse (!) 110    Resp 16    Ht '5\' 4"'$  (1.626 m)    Wt 224 lb (101.6 kg)    BMI 38.45 kg/m    Physical Exam Vitals signs and nursing note reviewed.  Constitutional:      Appearance: She is well-developed.  HENT:     Head: Normocephalic and atraumatic.  Eyes:     Conjunctiva/sclera: Conjunctivae normal.  Neck:     Musculoskeletal: Normal range of motion.  Cardiovascular:     Rate and Rhythm: Normal rate and regular rhythm.     Heart sounds: Normal heart sounds.  Pulmonary:     Effort: Pulmonary effort is normal.     Breath sounds: Normal breath sounds.  Abdominal:     General: Bowel sounds are normal.     Palpations: Abdomen is soft.  Lymphadenopathy:     Cervical: No cervical adenopathy.  Skin:    General: Skin is warm and dry.     Capillary Refill: Capillary refill takes less than 2 seconds.  Neurological:     Mental Status: She is alert and oriented to person, place, and time.  Psychiatric:        Behavior: Behavior normal.      Musculoskeletal Exam: C-spine, thoracic spine, and lumbar spine good ROM.  No midline spinal tenderness.  Shoulder joints good ROM with some discomfort.  Elbow joints, wrist joints, MCPs, PIPs, and DIPs good ROM with no synovitis.  Right hip replacement limited ROM with some discomfort.  Right knee painful ROM.  No warmth or effusion of the right knee joint.  Tenderness in the popliteal region and right calf.  No tenderness or swelling of the ankle joints.    CDAI Exam: CDAI Score: Not documented Patient Global Assessment: Not documented; Provider Global Assessment: Not documented Swollen: 0 ; Tender: 0  Joint Exam   Not documented   There is currently no information documented on the homunculus. Go to the Rheumatology activity and complete the homunculus joint exam.  Investigation: No additional findings.  Imaging: No results found.  Recent Labs: Lab Results  Component Value Date  WBC 10.0 07/18/2017   HGB 16.0 (H)  07/18/2017   PLT 335.0 07/18/2017   NA 140 07/18/2017   K 3.9 07/18/2017   CL 102 07/18/2017   CO2 34 (H) 07/18/2017   GLUCOSE 103 (H) 07/18/2017   BUN 11 07/18/2017   CREATININE 0.96 07/18/2017   BILITOT 0.4 07/18/2017   ALKPHOS 67 07/18/2017   AST 14 07/18/2017   ALT 11 07/18/2017   PROT 7.5 07/18/2017   ALBUMIN 3.8 07/18/2017   CALCIUM 9.2 07/18/2017   GFRAA >60 09/05/2015    Speciality Comments: No specialty comments available.  Procedures:  No procedures performed Allergies: Celecoxib; Codeine; Codeine; Duloxetine; Erythromycin; Escitalopram oxalate; Ezetimibe; Gluten meal; Lactose intolerance (gi); Lyrica [pregabalin]; Milnacipran; Pregabalin; Rofecoxib; Statins; Statins; Tramadol; Ultram [tramadol hcl]; Celebrex [celecoxib]; and Contrast media [iodinated diagnostic agents]   Assessment / Plan:     Visit Diagnoses: Fibromyalgia: She has generalized muscle aches and muscle tenderness due to fibromyalgia.  She has chronic fatigue and insomnia.  She has been having difficulty sleeping at night due to the discomfort she has been experiencing in her right knee joint for the past 1 month.  She was encouraged to stay active and exercise on a regular basis.    Other fatigue: Chronic and secondary to insomnia.   Other insomnia: She has been experiencing nocturnal pain in the right knee joint.  Good sleep hygiene was discussed.   DDD (degenerative disc disease), lumbar: She has chronic lower back pain.    History of total right hip arthroplasty: Chronic pain. She has painful ROM of the right hip joint.   Primary osteoarthritis of both knees: She presents today with right knee joint pain that started 1 month ago.  She gives a history of intermittent right knee joint swelling.  No warmth or effusion was noted on exam.  She has not had any recent injuries or falls.  She has no mechanical symptoms.  She had bilateral visco injections in [facility] February.  She has had cortisone  injections in the past but only received 2-3 days of pain relief.  She has tenderness in the popliteal region and right calf.  We will schedule a doppler ultrasound today to rule out a DVT.  She will schedule a follow up appointment for a right knee joint cortisone injection.    Other medical conditions are listed as follows:   History of gastroesophageal reflux (GERD)  Essential hypertension  MVP (mitral valve prolapse)  Hx of migraines  Anxiety and depression  History of COPD  History of hypothyroidism  History of sleep apnea  Polycythemia, secondary  History of asthma  History of diverticulosis   Orders: Orders Placed This Encounter  Procedures   Large Joint Inj   No orders of the defined types were placed in this encounter.   Face-to-face time spent with patient was 30 minutes. Greater than 50% of time was spent in counseling and coordination of care.  Follow-Up Instructions: Return in about 6 months (around 12/05/2018) for Fibromyalgia, Osteoarthritis.   Ofilia Neas, PA-C   I examined and evaluated the patient with Hazel Sams PA.  Patient continues to have some generalized pain and discomfort.  She was having a lot of pain and discomfort in her right lower extremity today.  She has discomfort in her right knee joint in her right calf on my exam.  We will schedule ultrasound to evaluate this further.  The plan of care was discussed as noted above.  Bo Merino, MD  Note - This record has been created using [employer].  Chart creation errors have been sought, but may not always  have been located. Such creation errors do not reflect on  the standard of medical care.

## 2018-06-04 ENCOUNTER — Ambulatory Visit (HOSPITAL_COMMUNITY)
Admission: RE | Admit: 2018-06-04 | Discharge: 2018-06-04 | Disposition: A | Discharge status: Home / Self Care | Payer: Medicare Other | Source: Ambulatory Visit | Attending: Internal Medicine | Admitting: Internal Medicine

## 2018-06-04 ENCOUNTER — Ambulatory Visit: Payer: Medicare Other | Admitting: Rheumatology

## 2018-06-04 ENCOUNTER — Other Ambulatory Visit: Payer: Self-pay

## 2018-06-04 ENCOUNTER — Encounter: Payer: Self-pay | Admitting: Rheumatology

## 2018-06-04 VITALS — BP 139/90 | HR 110 | Resp 16 | Ht 64.0 in | Wt 224.0 lb

## 2018-06-04 DIAGNOSIS — I10 Essential (primary) hypertension: Secondary | ICD-10-CM

## 2018-06-04 DIAGNOSIS — Z8709 Personal history of other diseases of the respiratory system: Secondary | ICD-10-CM

## 2018-06-04 DIAGNOSIS — M79661 Pain in right lower leg: Secondary | ICD-10-CM

## 2018-06-04 DIAGNOSIS — F419 Anxiety disorder, unspecified: Secondary | ICD-10-CM

## 2018-06-04 DIAGNOSIS — Z8669 Personal history of other diseases of the nervous system and sense organs: Secondary | ICD-10-CM

## 2018-06-04 DIAGNOSIS — M797 Fibromyalgia: Secondary | ICD-10-CM | POA: Diagnosis not present

## 2018-06-04 DIAGNOSIS — M25561 Pain in right knee: Secondary | ICD-10-CM

## 2018-06-04 DIAGNOSIS — M170 Bilateral primary osteoarthritis of knee: Secondary | ICD-10-CM

## 2018-06-04 DIAGNOSIS — G4709 Other insomnia: Secondary | ICD-10-CM | POA: Diagnosis not present

## 2018-06-04 DIAGNOSIS — Z8719 Personal history of other diseases of the digestive system: Secondary | ICD-10-CM

## 2018-06-04 DIAGNOSIS — R5383 Other fatigue: Secondary | ICD-10-CM

## 2018-06-04 DIAGNOSIS — Z96641 Presence of right artificial hip joint: Secondary | ICD-10-CM

## 2018-06-04 DIAGNOSIS — Z8639 Personal history of other endocrine, nutritional and metabolic disease: Secondary | ICD-10-CM

## 2018-06-04 DIAGNOSIS — I341 Nonrheumatic mitral (valve) prolapse: Secondary | ICD-10-CM

## 2018-06-04 DIAGNOSIS — F329 Major depressive disorder, single episode, unspecified: Secondary | ICD-10-CM

## 2018-06-04 DIAGNOSIS — M5136 Other intervertebral disc degeneration, lumbar region: Secondary | ICD-10-CM

## 2018-06-04 DIAGNOSIS — D751 Secondary polycythemia: Secondary | ICD-10-CM

## 2018-06-04 NOTE — Progress Notes (Signed)
Right lower extremity venous duplex. Results in Chart review CV Proc. [place] Naliah Eddington,RVS 06/04/2018, 4:43 PM

## 2018-06-05 ENCOUNTER — Telehealth: Payer: Self-pay | Admitting: *Deleted

## 2018-06-05 NOTE — Telephone Encounter (Signed)
error 

## 2018-06-05 NOTE — Progress Notes (Signed)
Pt was notified by the vascular lab. I advised the lab to inform pt that she may call our office if she wants knee injection.

## 2018-06-11 ENCOUNTER — Other Ambulatory Visit: Payer: Self-pay

## 2018-06-13 ENCOUNTER — Ambulatory Visit: Payer: Medicare Other | Admitting: Gynecology

## 2018-06-13 ENCOUNTER — Other Ambulatory Visit: Payer: Medicare Other

## 2018-06-25 ENCOUNTER — Other Ambulatory Visit: Payer: Self-pay

## 2018-06-27 ENCOUNTER — Other Ambulatory Visit: Payer: Self-pay

## 2018-06-27 ENCOUNTER — Encounter: Payer: Self-pay | Admitting: Gynecology

## 2018-06-27 ENCOUNTER — Ambulatory Visit (INDEPENDENT_AMBULATORY_CARE_PROVIDER_SITE_OTHER): Payer: Medicare Other

## 2018-06-27 ENCOUNTER — Ambulatory Visit: Payer: Medicare Other | Admitting: Gynecology

## 2018-06-27 VITALS — BP 124/80

## 2018-06-27 DIAGNOSIS — R102 Pelvic and perineal pain: Secondary | ICD-10-CM

## 2018-06-27 NOTE — Patient Instructions (Signed)
Follow-up with your gastroenterologist if your pain continues.

## 2018-06-27 NOTE — Progress Notes (Signed)
    [name] [birth year] [id number]        68 y.o.  [id number] presents for ultrasound due to lower abdominal pain.  She has a history of hysterectomy and separate BSO for endometriosis.  She also is complaining of some problems keeping her estrogen patch on as she frequently takes 2 baths a day to help with her arthritis.  Past medical history,surgical history, problem list, medications, allergies, family history and social history were all reviewed and documented in the EPIC chart.  Directed ROS with pertinent positives and negatives documented in the history of present illness/assessment and plan.  Exam: Vitals:   06/27/18 1434  BP: 124/80   General appearance:  Normal  Ultrasound transabdominal and transvaginal shows uterus and ovaries surgically absent consistent with history of hysterectomy with BSO.  Vaginal cuff is normal.  No pelvic masses or other abnormalities noted.  No free fluid.  Assessment/Plan:  68 y.o. [id number] with history of abdominal pelvic pain.  Had negative CT scan other than diverticulosis last year.  Ultrasound is negative now.  Discussed possibilities to include adhesions, on visualized endometriosis, non-gynecologic such as GI.  At this point my only next step would be laparoscopy.  The patient is not interested in any surgery.  We will follow-up with gastroenterologist if her pain continues to cover that base.  We discussed the difficulty keeping her patch on due to frequent baths.  Had been on Femring but discontinued due to frequent yeast infections.  Discussed switching to an oral estradiol.  Issues of first-pass effect with possibly slight higher risk of thrombosis reviewed.  At this point the patient wants to think about it and continue with the patch.  She may call if she changes her mind to have the estradiol 0.5 mg prescribed.    Anastasio Auerbach MD, 2:45 PM 06/27/2018

## 2018-07-01 ENCOUNTER — Encounter: Payer: Self-pay | Admitting: Rheumatology

## 2018-07-01 NOTE — Telephone Encounter (Signed)
Ok to apply for visco for bilateral knee joints.

## 2018-07-02 ENCOUNTER — Telehealth: Payer: Self-pay

## 2018-07-02 NOTE — Telephone Encounter (Signed)
Patient's last Synvisc series, bilateral knee was on 03/04/2018.  Patient will not be able to receive another series of injections for bilateral knee until after September 02, 2018.  It has to be 6 months from last injection.  Please advise.  Thank you.

## 2018-07-04 ENCOUNTER — Telehealth: Payer: Self-pay | Admitting: Rheumatology

## 2018-07-04 ENCOUNTER — Encounter: Payer: Self-pay | Admitting: Gynecology

## 2018-07-04 NOTE — Telephone Encounter (Signed)
Patient left a message requesting for someone to research her chart to find the name of gel injections she had back in 2019. Please call to advise.

## 2018-07-05 NOTE — Telephone Encounter (Signed)
Appointment scheduled for June 23 with Dr Phineas Real. I offered next week with Elon Alas, NP since Dr Phineas Real is out of the office the whole week but patient wanted to wait to see Dr Phineas Real.

## 2018-07-05 NOTE — Telephone Encounter (Signed)
I called patient. She has used Euflexxa and Hyalgan in the past.

## 2018-07-09 ENCOUNTER — Telehealth: Payer: Self-pay | Admitting: Rheumatology

## 2018-07-09 NOTE — Telephone Encounter (Signed)
Pam from [facility] left a voicemail stating patient contacted them stating she was in quite a lot of pain and the injections she was receiving from Dr. Estanislado Pandy are not working.   Pam states member told her she cannot have anymore treatments until August and her pain is unbearable.   Please contact the patient and see if you can assist her.

## 2018-07-09 NOTE — Telephone Encounter (Signed)
Called patient to address her knee pain and patient is aware she cannot receive any gel injections until August. I offered patient an appointment for a cortisone injection and patient will call back tomorrow with a decision. Patient states she is calling the insurance company again tomorrow about sooner gel injections.

## 2018-07-12 ENCOUNTER — Telehealth: Payer: Self-pay | Admitting: Rheumatology

## 2018-07-12 NOTE — Telephone Encounter (Signed)
Patient called stating she spoke with Pam at [facility] who told her if Dr. Estanislado Pandy sends in paperwork stating the Synvisc didn't work and requesting to order a different brand, they could approve the injections and waive the 6 month wait time.  Patient states a new request needs to be submitted based on the Synvisc not working and called into the Provider Line [phone].  Patient states Euflexxa and Hyalgan have worked in the past.  Patient requested a return call.

## 2018-07-15 NOTE — Telephone Encounter (Signed)
Ok to send the request for Euflexxa or Hyalgan.

## 2018-07-16 ENCOUNTER — Other Ambulatory Visit: Payer: Self-pay

## 2018-07-16 ENCOUNTER — Ambulatory Visit: Payer: Medicare Other | Admitting: Gynecology

## 2018-07-16 ENCOUNTER — Encounter: Payer: Self-pay | Admitting: Gynecology

## 2018-07-16 VITALS — BP 140/84

## 2018-07-16 DIAGNOSIS — N368 Other specified disorders of urethra: Secondary | ICD-10-CM | POA: Diagnosis not present

## 2018-07-16 DIAGNOSIS — N898 Other specified noninflammatory disorders of vagina: Secondary | ICD-10-CM | POA: Diagnosis not present

## 2018-07-16 DIAGNOSIS — N39498 Other specified urinary incontinence: Secondary | ICD-10-CM

## 2018-07-16 MED ORDER — FLUCONAZOLE 150 MG PO TABS: 150.0000 mg | ORAL_TABLET | Freq: Once | ORAL | 0 refills | Status: AC

## 2018-07-16 NOTE — Patient Instructions (Signed)
Follow-up if the small nodule changes at all.  Take the one Diflucan pill.

## 2018-07-16 NOTE — Progress Notes (Signed)
    [name] 12/16/1950 [id number]        68 y.o.  [id number] presents having found a small nodule on the outside of her vagina a week or so ago.  Was recently seen beginning of June with normal exam.  She noticed it on self exam.  Is not causing her any pain or has any drainage.  Past medical history,surgical history, problem list, medications, allergies, family history and social history were all reviewed and documented in the EPIC chart.  Directed ROS with pertinent positives and negatives documented in the history of present illness/assessment and plan.  Exam: [name] assistant Vitals:   07/16/18 1132  BP: 140/84   General appearance:  Normal Abdomen soft nontender without masses guarding rebound Pelvic external BUS vagina with atrophic changes.  White discharge noted.  Small less than pea-sized well-circumscribed mobile subcutaneous nodule left Skene's area.  Nontender/nondraining.  Bimanual exam without masses or tenderness  Assessment/Plan:  68 y.o. [id number] with small subcutaneous nodule lateral to the urethral opening in the Skene's duct area.  Discussed differential to include Skene's duct cyst, sebaceous cyst, related to the urethra such as suburethral cyst and although unlikely cancer.  As it is easily palpated by the patient my recommendation would be to follow and as long as it remains stable or resolves then will monitor.  If enlarges or becomes symptomatic then will re-present for excision.  She does have a vaginal discharge although the wet prep is negative under the cover her with Diflucan 150 mg daily as she has complained of some intermittent vaginal itching.  Patient also had complained of some urinary leakage intermittently causing her to wear a pad.  We discussed the issues of urinary incontinence.  Has some mild stress symptoms.  No urgency symptoms.  Will check baseline urine analysis.  Discussed possible referral to urology if overly bothersome.  At this  point the patient wants to monitor.    Anastasio Auerbach MD, 12:00 PM 07/16/2018

## 2018-07-16 NOTE — Addendum Note (Signed)
Addended by: Nelva Nay on: 07/16/2018 12:48 PM   Modules accepted: Orders

## 2018-07-17 ENCOUNTER — Encounter: Payer: Self-pay | Admitting: *Deleted

## 2018-07-17 LAB — WET PREP FOR TRICH, YEAST, CLUE

## 2018-07-17 NOTE — Telephone Encounter (Signed)
Letter has been written for patient. Contacted the patient and she provided the case number as well as contact number for Pam at [facility]- [phone] ext. [phone]. Attempted to contact Pam to get a fax number to fax the appeal letter. Will fax once we receive fax number.

## 2018-07-18 LAB — URINALYSIS, COMPLETE W/RFL CULTURE
Bacteria, UA: NONE SEEN /HPF
Bilirubin Urine: NEGATIVE
Glucose, UA: NEGATIVE
Hgb urine dipstick: NEGATIVE
Hyaline Cast: NONE SEEN /LPF
Ketones, ur: NEGATIVE
Leukocyte Esterase: NEGATIVE
Nitrites, Initial: NEGATIVE
Protein, ur: NEGATIVE
Specific Gravity, Urine: 1.016 (ref 1.001–1.03)
Squamous Epithelial / HPF: NONE SEEN /HPF (ref <=5)
WBC, UA: NONE SEEN /HPF (ref 0–5)
pH: <=5 (ref 5.0–8.0)

## 2018-07-18 LAB — URINE CULTURE
MICRO NUMBER:: 602667
SPECIMEN QUALITY:: ADEQUATE

## 2018-07-18 LAB — CULTURE INDICATED

## 2018-07-18 NOTE — Telephone Encounter (Signed)
Letter has been faxed.

## 2018-07-25 ENCOUNTER — Ambulatory Visit (INDEPENDENT_AMBULATORY_CARE_PROVIDER_SITE_OTHER): Payer: Medicare Other

## 2018-07-25 ENCOUNTER — Telehealth: Payer: Self-pay

## 2018-07-25 ENCOUNTER — Other Ambulatory Visit: Payer: Self-pay | Admitting: Gynecology

## 2018-07-25 ENCOUNTER — Other Ambulatory Visit: Payer: Self-pay

## 2018-07-25 DIAGNOSIS — Z780 Asymptomatic menopausal state: Secondary | ICD-10-CM

## 2018-07-25 DIAGNOSIS — M8580 Other specified disorders of bone density and structure, unspecified site: Secondary | ICD-10-CM

## 2018-07-25 NOTE — Telephone Encounter (Addendum)
Submitted VOB for Euflexxa, bilateral knee.

## 2018-07-29 ENCOUNTER — Encounter: Payer: Self-pay | Admitting: Gynecology

## 2018-07-29 ENCOUNTER — Telehealth: Payer: Self-pay

## 2018-07-29 NOTE — Telephone Encounter (Signed)
Talked with Pam at [facility] concerning patient's approval for Euflexxa.  Advised Pam that I haven't received a response.  Stated that she will update the patient.

## 2018-07-30 ENCOUNTER — Telehealth: Payer: Self-pay

## 2018-07-30 NOTE — Telephone Encounter (Signed)
Received a VM from [facility] with [facility] stating that Euflexxa is not a preferred product.  Talked with [facility] and advised her that patient has tried and failed Synvisc.  Per [place], patient would need to have tried/failed all preferred products before being approved for a non-preferred product, which are Durolane, Gelsyn-3, Synvisc, and SynviscOne.   [facility] that patient has tried/failed Synvisc.  Brayton Layman stated that office notes and x-ray reports would need to be faxed to consider an approval for Euflexxa.  Faxed office notes, x-ray report, and letter per Dr. Estanislado Pandy to [place] at [phone].  CB# is [phone].

## 2018-07-30 NOTE — Telephone Encounter (Signed)
PA required for Euflexxa, bilateral knee.  Talked with Legrand Como at [facility] to obtain a Pre- Cert.  Per Legrand Como, pre-cert is pending. Pending PA# [id number]

## 2018-08-01 NOTE — Progress Notes (Signed)
Office Visit Note  Patient: [name]             Date of Birth: February 26, 1950           MRN: [id number]             PCP: Biagio Borg, MD Referring: Biagio Borg, MD Visit Date: 08/13/2018 Occupation: '@GUAROCC'$ @  Subjective:  Right knee joint pain    History of Present Illness: [name] is a 68 y.o. female with history of fibromyalgia and osteoarthritis.  She is having generalized muscle aches and muscle tenderness due to fibromyalgia. She has right trapezius muscle tenderness and tension.  She is having right SI joint pain.  She uses lidoderm patches as needed for pain relief.  She is having severe pain in the right knee joint.  She is here today for bilateral visco gel injections.  She states her right knee joint pain radiates down to the right ankle.  She has been using voltaren gel topically as needed for pain relief.     Activities of Daily Living:  Patient reports joint stiffness all day  Patient Reports nocturnal pain.  Difficulty dressing/grooming: Reports Difficulty climbing stairs: Reports Difficulty getting out of chair: Reports Difficulty using hands for taps, buttons, cutlery, and/or writing: Denies  Review of Systems  Constitutional: Positive for fatigue.  HENT: Positive for mouth dryness. Negative for mouth sores and nose dryness.   Eyes: Positive for dryness. Negative for pain, itching and visual disturbance.  Respiratory: Negative for cough, hemoptysis, shortness of breath, wheezing and difficulty breathing.   Cardiovascular: Negative for chest pain, palpitations, hypertension and swelling in legs/feet.  Gastrointestinal: Negative for abdominal pain, blood in stool, constipation and diarrhea.  Endocrine: Negative for increased urination.  Genitourinary: Negative for difficulty urinating, painful urination and pelvic pain.  Musculoskeletal: Positive for arthralgias, joint pain, joint swelling and morning stiffness. Negative for myalgias,  muscle weakness, muscle tenderness and myalgias.  Skin: Negative for color change, pallor, rash, hair loss, nodules/bumps, redness, skin tightness, ulcers and sensitivity to sunlight.  Allergic/Immunologic: Negative for susceptible to infections.  Neurological: Positive for headaches and weakness. Negative for dizziness, light-headedness, numbness and memory loss.  Hematological: Negative for swollen glands.  Psychiatric/Behavioral: Positive for sleep disturbance. Negative for depressed mood and confusion. The patient is not nervous/anxious.     PMFS History:  Patient Active Problem List   Diagnosis Date Noted  . RLQ abdominal pain 07/18/2017  . Hyperglycemia 07/18/2017  . Right lumbar radiculopathy 05/17/2017  . Abdominal pain, epigastric 05/17/2017  . Hematoma 05/17/2017  . Right leg pain 11/09/2016  . Left ear hearing loss 11/09/2016  . Right hip pain 03/17/2016  . Right groin pain 08/19/2015  . Diarrhea 07/22/2015  . Right low back pain 07/22/2015  . Left lower quadrant pain 08/21/2014  . Bloating 08/21/2014  . Nausea without vomiting 08/21/2014  . Panniculitis 06/12/2014  . Genetic testing 06/04/2014  . Family history of breast cancer 06/04/2014  . Peripheral edema 04/29/2014  . Bilateral leg edema 04/15/2014  . Mouth pain 03/13/2014  . Pain of right great toe 03/13/2014  . COPD exacerbation ([place]) 02/06/2014  . Shingles outbreak 12/30/2013  . Thrush 12/30/2013  . Trigger point of thoracic region 12/30/2013  . Migraine headache 11/13/2013  . LUQ pain 05/08/2013  . Polycythemia, secondary 09/22/2012  . Chest pain 03/27/2012  . Cervical dysplasia   . Spell of dizziness 10/14/2011  . Bruising 10/14/2011  . High risk medication use 10/14/2011  .  Lesion of mouth 05/30/2011  . Chronic sinusitis 05/30/2011  . Encounter for preventative adult health care exam with abnormal findings 02/25/2011  . Impaired glucose tolerance 02/25/2011  . Endometriosis   . OTHER&UNSPECIFIED  DISEASES THE ORAL SOFT TISSUES 03/30/2010  . Family hx of colon cancer 03/11/2010  . THYROID NODULE, RIGHT 03/01/2010  . BACK PAIN 03/01/2010  . GOITER 10/26/2008  . CONSTIPATION 11/18/2007  . LUMBAR RADICULOPATHY, LEFT 11/07/2007  . Hypothyroidism 05/31/2007  . ANXIETY 05/31/2007  . ASTHMA 05/31/2007  . COPD (chronic obstructive pulmonary disease) ([place]) 05/31/2007  . COLONIC POLYPS, HX OF 05/31/2007  . HYPERLIPIDEMIA 09/22/2006  . Obesity 09/22/2006  . SICKLE CELL TRAIT 09/22/2006  . Depression 09/22/2006  . OSA (obstructive sleep apnea) 09/22/2006  . Essential hypertension 09/22/2006  . VENOUS INSUFFICIENCY 09/22/2006  . ALLERGIC RHINITIS 09/22/2006  . GERD 09/22/2006  . PEPTIC ULCER DISEASE 09/22/2006  . Irritable bowel syndrome 09/22/2006  . OSTEOARTHRITIS 09/22/2006  . FIBROMYALGIA 09/22/2006  . OSTEOPOROSIS 09/22/2006  . FATIGUE, CHRONIC 09/22/2006  . PERIPHERAL EDEMA 09/22/2006  . MITRAL VALVE PROLAPSE, HX OF 09/22/2006    Past Medical History:  Diagnosis Date  . Anxiety   . Arthritis   . Asthma   . Asthma   . Biliary dyskinesia   . Bowel obstruction ([place])   . BRCA negative 02/2013  . Cervical dysplasia   . Complication of anesthesia    "I have the shakes"  . Depression   . Diverticulosis   . Elevated cholesterol   . Endometriosis   . Fibromyalgia   . GERD (gastroesophageal reflux disease)   . Heart murmur    mvp  . Hx of oral aphthous ulcers   . Hypertension   . Hypertension    no med  . Hypothyroidism    hx nodule on thyroid   . Impaired glucose tolerance 02/25/2011  . Low back pain   . Migraine   . Migraines   . Nocturia   . Polycythemia vera ([place])   . Shingles   . Shortness of breath    with exertion  . Sickle cell trait ([place])   . Sleep apnea    has not used c-pap x 1 yr. use mouthguard now  . Thyroid disease   . Thyroid disease    Hypothyroid    Family History  Problem Relation Age of Onset  . Hypertension Mother   . Heart disease  Mother   . Breast cancer Mother        Age 50  . Bone cancer Mother   . Hypertension Father   . Heart disease Father   . Diabetes Father   . Colon cancer Sister 54  . Sarcoidosis Sister   . Breast cancer Sister 47  . Breast cancer Maternal Grandmother        Age 98  . Ovarian cancer Paternal Grandmother   . Breast cancer Paternal Grandmother        Age unknown  . Sarcoidosis Brother   . Breast cancer Maternal Aunt        Age 8's  . Breast cancer Maternal Aunt        Age 71  . Heart failure Father   . Arthritis Father   . Colon cancer Sister   . Sarcoidosis Sister   . Multiple sclerosis Cousin    Past Surgical History:  Procedure Laterality Date  . ABDOMINAL HYSTERECTOMY    . BREAST EXCISIONAL BIOPSY Left 1999  . BREAST SURGERY  Cystectomy on Left breast  . BREAST SURGERY     Benign breast cyst  . CHOLECYSTECTOMY    . CHOLECYSTECTOMY  12/05/2011   Procedure: LAPAROSCOPIC CHOLECYSTECTOMY;  Surgeon: Ralene Ok, MD;  Location: [facility];  Service: General;  Laterality: N/A;  . COLPOSCOPY    . GYNECOLOGIC CRYOSURGERY    . HERNIA REPAIR     abdomen  . JOINT REPLACEMENT Right    hip  . OOPHORECTOMY     BSO  . PANNICULECTOMY N/A 06/12/2014   Procedure: PANNICULECTOMY;  Surgeon: Irene Limbo, MD;  Location: [place];  Service: Plastics;  Laterality: N/A;  . PELVIC LAPAROSCOPY  1995,2002   DL LSO DL RSO  . TONSILLECTOMY    . TOTAL HIP ARTHROPLASTY     Right  . VAGINAL HYSTERECTOMY  1983   Social History   Social History Narrative   Lives at home alone.   Right-handed.   6 cups per week.   Immunization History  Administered Date(s) Administered  . Influenza Split 10/27/2011  . Influenza Whole 10/26/2008  . Influenza-Unspecified 09/23/2012, 10/23/2017  . Pneumococcal Polysaccharide-23 01/24/2004     Objective: Vital Signs: BP (!) 157/87 (BP Location: Left Wrist, Patient Position: Sitting, Cuff Size: Normal)   Pulse 99   Resp 15    Ht '5\' 4"'$  (1.626 m)   Wt 226 lb (102.5 kg)   BMI 38.79 kg/m    Physical Exam Vitals signs and nursing note reviewed.  Constitutional:      Appearance: She is well-developed.  HENT:     Head: Normocephalic and atraumatic.  Eyes:     Conjunctiva/sclera: Conjunctivae normal.  Neck:     Musculoskeletal: Normal range of motion.  Cardiovascular:     Rate and Rhythm: Normal rate and regular rhythm.     Heart sounds: Normal heart sounds.  Pulmonary:     Effort: Pulmonary effort is normal.     Breath sounds: Normal breath sounds.  Abdominal:     General: Bowel sounds are normal.     Palpations: Abdomen is soft.  Lymphadenopathy:     Cervical: No cervical adenopathy.  Skin:    General: Skin is warm and dry.     Capillary Refill: Capillary refill takes less than 2 seconds.  Neurological:     Mental Status: She is alert and oriented to person, place, and time.  Psychiatric:        Behavior: Behavior normal.      Musculoskeletal Exam: Generalized hyperalgesia and positive tender points.  She has right trapezius tenderness.  C-spine, thoracic spine, and lumbar spine good ROM.  Shoulder joints, elbow joints, wrist joints, MCPs, PIPs, and DIPs good ROM with no synovitis.  Complete fist formation bilaterally.  Hip joints good ROM.  Right knee pain with ROM.  No warmth or effusion of knee joints.  No tenderness or swelling of ankle joints.  Pedal edema bilaterally.    CDAI Exam: CDAI Score: - Patient Global: -; Provider Global: - Swollen: -; Tender: - Joint Exam   No joint exam has been documented for this visit   There is currently no information documented on the homunculus. Go to the Rheumatology activity and complete the homunculus joint exam.  Investigation: No additional findings.  Imaging: No results found.  Recent Labs: Lab Results  Component Value Date   WBC 10.0 07/18/2017   HGB 16.0 (H) 07/18/2017   PLT 335.0 07/18/2017   NA 140 07/18/2017   K 3.9 07/18/2017   CL  102 07/18/2017   CO2 34 (H) 07/18/2017   GLUCOSE 103 (H) 07/18/2017   BUN 11 07/18/2017   CREATININE 0.96 07/18/2017   BILITOT 0.4 07/18/2017   ALKPHOS 67 07/18/2017   AST 14 07/18/2017   ALT 11 07/18/2017   PROT 7.5 07/18/2017   ALBUMIN 3.8 07/18/2017   CALCIUM 9.2 07/18/2017   GFRAA >60 09/05/2015    Speciality Comments: No specialty comments available.  Procedures:  Large Joint Inj: bilateral knee on 08/13/2018 3:43 PM Indications: pain Details: 27 G 1.5 in needle, medial approach  Arthrogram: No  Medications (Right): 20 mg Sodium Hyaluronate 20 MG/2ML; 1.5 mL lidocaine 1 % Aspirate (Right): 0 mL Medications (Left): 20 mg Sodium Hyaluronate 20 MG/2ML; 1.5 mL lidocaine 1 % Aspirate (Left): 0 mL Outcome: tolerated well, no immediate complications Procedure, treatment alternatives, risks and benefits explained, specific risks discussed. Consent was given by the patient. Immediately prior to procedure a time out was called to verify the correct patient, procedure, equipment, support staff and site/side marked as required. Patient was prepped and draped in the usual sterile fashion.     Allergies: Celecoxib, Codeine, Codeine, Duloxetine, Erythromycin, Escitalopram oxalate, Ezetimibe, Gluten meal, Lactose intolerance (gi), Lyrica [pregabalin], Milnacipran, Pregabalin, Rofecoxib, Statins, Statins, Tramadol, Ultram [tramadol hcl], Celebrex [celecoxib], and Contrast media [iodinated diagnostic agents]   Assessment / Plan:     Visit Diagnoses: Fibromyalgia - She has generalized hyperalgesia and positive tender points on exam.  She continues to have frequent and severe fibromyalgia flares.  She has right trapezius muscle tension and tenderness.  She uses lidoderm patches as needed for pain relief. She has fentanyl patches which she uses as needed for pain relief.   She states she is having difficulty with ADLs due to the pain she is experiencing. We discussed a referral to pain management  since her pain has become severe. She will follow up in 6 months.   Osteoporosis- managed by her PCP.  She takes a calcium and vitamin D supplement daily.  She was previously treated with Reclast. Most recent DEXA on 07/25/2018 normal.  Other insomnia - She has interrupted sleep at night due to the pain she experiences.   Other fatigue -Chronic and related to insomnia.   DDD (degenerative disc disease), lumbar - Chronic pain.   Primary osteoarthritis of both knees - s/p synvisc bilateral knees 01/2018-02/2018 -She presents today for the first Euflexxa injection for both knee joints.  She tolerated the procedures well.  Procedure notes completed above.  History of total right hip arthroplasty - She has good ROM with discomfort in the right hip joint.   Other medical conditions are listed as follows:   History of gastroesophageal reflux (GERD)   History of sleep apnea   History of diverticulosis   Hx of migraines   Anxiety and depression   History of asthma   History of hypothyroidism   MVP (mitral valve prolapse)   History of COPD   Polycythemia, secondary   Essential hypertension   Orders: Orders Placed This Encounter  Procedures  . Large Joint Inj: bilateral knee   Meds ordered this encounter  Medications  . lidocaine (LIDODERM) 5 %    Sig: Place 1 patch onto the skin daily. Remove & Discard patch within 12 hours or as directed by MD    Dispense:  30 patch    Refill:  1    Face-to-face time spent with patient was 30 minutes. Greater than 50% of time was spent in counseling and  coordination of care.  Follow-Up Instructions: Return in about 6 months (around 02/13/2019) for Fibromyalgia, Osteoarthritis.   Ofilia Neas, PA-C   I examined and evaluated the patient with Hazel Sams PA. The plan of care was discussed as noted above.  Bo Merino, MD  Note - This record has been created using Editor, commissioning.  Chart creation errors have been sought, but may  not always  have been located. Such creation errors do not reflect on  the standard of medical care.

## 2018-08-06 ENCOUNTER — Telehealth: Payer: Self-pay

## 2018-08-06 ENCOUNTER — Telehealth: Payer: Self-pay | Admitting: Rheumatology

## 2018-08-06 NOTE — Telephone Encounter (Signed)
Advised that Wendie Simmer., RN with [facility] left a VM stating that patient has been approved for gel injections. Please schedule patient.  Thank You.  Approved for Euflexxa, bilateral knee. Buy & Bill Covered at 100% of the allowable amount Co-pay of $90.00 required PA required PA Approval# [id number] Valid 08/13/2018- 01/23/2019 Approved for 6 units

## 2018-08-06 NOTE — Telephone Encounter (Signed)
Noted. Thank You.

## 2018-08-06 NOTE — Telephone Encounter (Signed)
Monica/RN/Monica [phone] called stated auth for request of [phone] [id number] approve 6 units=6 times from 08/13/18-01/23/19  Please call if you have any questions.

## 2018-08-13 ENCOUNTER — Ambulatory Visit (INDEPENDENT_AMBULATORY_CARE_PROVIDER_SITE_OTHER): Payer: Medicare Other | Admitting: Physician Assistant

## 2018-08-13 ENCOUNTER — Other Ambulatory Visit: Payer: Self-pay

## 2018-08-13 ENCOUNTER — Encounter: Payer: Self-pay | Admitting: Physician Assistant

## 2018-08-13 VITALS — BP 157/87 | HR 99 | Resp 15 | Ht 64.0 in | Wt 226.0 lb

## 2018-08-13 DIAGNOSIS — Z96641 Presence of right artificial hip joint: Secondary | ICD-10-CM

## 2018-08-13 DIAGNOSIS — M797 Fibromyalgia: Secondary | ICD-10-CM | POA: Diagnosis not present

## 2018-08-13 DIAGNOSIS — F419 Anxiety disorder, unspecified: Secondary | ICD-10-CM

## 2018-08-13 DIAGNOSIS — Z8639 Personal history of other endocrine, nutritional and metabolic disease: Secondary | ICD-10-CM

## 2018-08-13 DIAGNOSIS — G4709 Other insomnia: Secondary | ICD-10-CM

## 2018-08-13 DIAGNOSIS — M170 Bilateral primary osteoarthritis of knee: Secondary | ICD-10-CM | POA: Diagnosis not present

## 2018-08-13 DIAGNOSIS — D751 Secondary polycythemia: Secondary | ICD-10-CM

## 2018-08-13 DIAGNOSIS — M5136 Other intervertebral disc degeneration, lumbar region: Secondary | ICD-10-CM | POA: Diagnosis not present

## 2018-08-13 DIAGNOSIS — R5383 Other fatigue: Secondary | ICD-10-CM

## 2018-08-13 DIAGNOSIS — Z8669 Personal history of other diseases of the nervous system and sense organs: Secondary | ICD-10-CM

## 2018-08-13 DIAGNOSIS — I341 Nonrheumatic mitral (valve) prolapse: Secondary | ICD-10-CM

## 2018-08-13 DIAGNOSIS — F329 Major depressive disorder, single episode, unspecified: Secondary | ICD-10-CM

## 2018-08-13 DIAGNOSIS — M51369 Other intervertebral disc degeneration, lumbar region without mention of lumbar back pain or lower extremity pain: Secondary | ICD-10-CM

## 2018-08-13 DIAGNOSIS — I10 Essential (primary) hypertension: Secondary | ICD-10-CM

## 2018-08-13 DIAGNOSIS — Z8709 Personal history of other diseases of the respiratory system: Secondary | ICD-10-CM

## 2018-08-13 DIAGNOSIS — F32A Depression, unspecified: Secondary | ICD-10-CM

## 2018-08-13 DIAGNOSIS — Z8719 Personal history of other diseases of the digestive system: Secondary | ICD-10-CM

## 2018-08-13 MED ORDER — SODIUM HYALURONATE (VISCOSUP) 20 MG/2ML IX SOSY
20.0000 mg | PREFILLED_SYRINGE | INTRA_ARTICULAR | Status: AC | PRN
  Administered 2018-08-13: 20 mg via INTRA_ARTICULAR

## 2018-08-13 MED ORDER — LIDOCAINE HCL 1 % IJ SOLN
1.5000 mL | INTRAMUSCULAR | Status: AC | PRN
  Administered 2018-08-13: 1.5 mL

## 2018-08-13 MED ORDER — LIDOCAINE 5 % EX PTCH: 1.0000 | MEDICATED_PATCH | CUTANEOUS | 1 refills | Status: DC

## 2018-08-20 ENCOUNTER — Other Ambulatory Visit: Payer: Self-pay

## 2018-08-20 ENCOUNTER — Ambulatory Visit (INDEPENDENT_AMBULATORY_CARE_PROVIDER_SITE_OTHER): Payer: Medicare Other | Admitting: Rheumatology

## 2018-08-20 DIAGNOSIS — M170 Bilateral primary osteoarthritis of knee: Secondary | ICD-10-CM

## 2018-08-20 MED ORDER — LIDOCAINE HCL 1 % IJ SOLN
1.5000 mL | INTRAMUSCULAR | Status: AC | PRN
  Administered 2018-08-20: 1.5 mL

## 2018-08-20 MED ORDER — SODIUM HYALURONATE (VISCOSUP) 20 MG/2ML IX SOSY
20.0000 mg | PREFILLED_SYRINGE | INTRA_ARTICULAR | Status: AC | PRN
  Administered 2018-08-20: 20 mg via INTRA_ARTICULAR

## 2018-08-20 NOTE — Progress Notes (Signed)
   Procedure Note  Patient: [name]             Date of Birth: [birth year]           MRN: [id number]             Visit Date: 08/20/2018  Procedures: Visit Diagnoses:  1. Primary osteoarthritis of both knees    Euflexxa #2 Bilateral knee joint injections B/B Large Joint Inj: bilateral knee on 08/20/2018 8:12 AM Indications: pain Details: 27 G 1.5 in needle, medial approach  Arthrogram: No  Medications (Right): 20 mg Sodium Hyaluronate 20 MG/2ML; 1.5 mL lidocaine 1 % Aspirate (Right): 0 mL Medications (Left): 20 mg Sodium Hyaluronate 20 MG/2ML; 1.5 mL lidocaine 1 % Aspirate (Left): 0 mL Outcome: tolerated well, no immediate complications Procedure, treatment alternatives, risks and benefits explained, specific risks discussed. Consent was given by the patient. Immediately prior to procedure a time out was called to verify the correct patient, procedure, equipment, support staff and site/side marked as required. Patient was prepped and draped in the usual sterile fashion.     Bo Merino, MD

## 2018-08-29 ENCOUNTER — Other Ambulatory Visit: Payer: Self-pay

## 2018-08-29 ENCOUNTER — Ambulatory Visit: Payer: Medicare Other | Admitting: Physician Assistant

## 2018-08-29 ENCOUNTER — Ambulatory Visit (INDEPENDENT_AMBULATORY_CARE_PROVIDER_SITE_OTHER): Payer: Medicare Other | Admitting: Rheumatology

## 2018-08-29 DIAGNOSIS — M1712 Unilateral primary osteoarthritis, left knee: Secondary | ICD-10-CM | POA: Diagnosis not present

## 2018-08-29 DIAGNOSIS — M1711 Unilateral primary osteoarthritis, right knee: Secondary | ICD-10-CM

## 2018-08-29 DIAGNOSIS — M170 Bilateral primary osteoarthritis of knee: Secondary | ICD-10-CM

## 2018-08-29 MED ORDER — LIDOCAINE HCL 1 % IJ SOLN
1.5000 mL | INTRAMUSCULAR | Status: AC | PRN
  Administered 2018-08-29: 1.5 mL

## 2018-08-29 MED ORDER — SODIUM HYALURONATE (VISCOSUP) 20 MG/2ML IX SOSY
20.0000 mg | PREFILLED_SYRINGE | INTRA_ARTICULAR | Status: AC | PRN
  Administered 2018-08-29: 20 mg via INTRA_ARTICULAR

## 2018-08-29 NOTE — Progress Notes (Signed)
   Procedure Note  Patient: [name]             Date of Birth: 04/12/1950           MRN: [id number]             Visit Date: 08/29/2018  Procedures: Visit Diagnoses:  1. Primary osteoarthritis of both knees    Euflexxa #3 bilateral knee joint injections B/B Large Joint Inj: bilateral knee on 08/29/2018 8:15 AM Indications: pain Details: 27 G 1.5 in needle, medial approach  Arthrogram: No  Medications (Right): 20 mg Sodium Hyaluronate 20 MG/2ML; 1.5 mL lidocaine 1 % Aspirate (Right): 0 mL Medications (Left): 20 mg Sodium Hyaluronate 20 MG/2ML; 1.5 mL lidocaine 1 % Aspirate (Left): 0 mL Outcome: tolerated well, no immediate complications Procedure, treatment alternatives, risks and benefits explained, specific risks discussed. Consent was given by the patient. Immediately prior to procedure a time out was called to verify the correct patient, procedure, equipment, support staff and site/side marked as required. Patient was prepped and draped in the usual sterile fashion.    Patient states that she has not had very good response so far with the Euflexxa injections.  I advised her to wait for few weeks or a month prior to noticing any improvement.  I also discussed weight loss diet and exercise.  Handout on weight management clinic was also given.  If her knee joint symptoms persist and she will call us. Bo Merino, MD

## 2018-09-18 ENCOUNTER — Other Ambulatory Visit: Payer: Self-pay

## 2018-09-18 MED ORDER — NONFORMULARY OR COMPOUNDED ITEM: 2 refills | Status: AC

## 2018-09-18 NOTE — Telephone Encounter (Signed)
The patient can buy these over-the-counter.  If for whatever reason she would prefer a prescription then okay to refill

## 2018-09-24 ENCOUNTER — Telehealth: Payer: Self-pay | Admitting: Rheumatology

## 2018-09-24 NOTE — Telephone Encounter (Signed)
Patient called back requesting the following information be included in the letter to the apartment complex.    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

## 2018-09-24 NOTE — Telephone Encounter (Signed)
Ok to provide letter

## 2018-09-24 NOTE — Telephone Encounter (Signed)
Patient left a voicemail stating she is moving into her daughter's apartment complex and is requesting a letter from Dr. Estanislado Pandy stating it is medically necessary for her to have a handicap parking space.  Please email letter to:  [email or web address].

## 2018-09-24 NOTE — Telephone Encounter (Signed)
Discussed with Dr. Estanislado Pandy.  We cannot justify requesting an additional key fob.  Ok to add commode accessories and parking space.

## 2018-09-25 NOTE — Telephone Encounter (Signed)
Attempted to contact the patient and left message for patient to call the office.  

## 2018-09-26 ENCOUNTER — Encounter: Payer: Self-pay | Admitting: *Deleted

## 2018-09-26 NOTE — Telephone Encounter (Signed)
Patient advised that we will be able to write letter for handicap commode and handicap parking space but unable to justify the key fob. Left message to advise patient letter is ready for pick up.

## 2018-10-28 ENCOUNTER — Other Ambulatory Visit: Payer: Self-pay | Admitting: Internal Medicine

## 2018-10-28 MED ORDER — THYROID 30 MG PO TABS: ORAL_TABLET | ORAL | 1 refills | Status: DC

## 2018-10-28 NOTE — Telephone Encounter (Signed)
Requested medication (s) are due for refill today:yes  Requested medication (s) are on the active medication list: yes  Last refill:  03/03/2018  Future visit scheduled: no  Notes to clinic:  Review for refill   Requested Prescriptions  Pending Prescriptions Disp Refills   thyroid (NP THYROID) 30 MG tablet 90 tablet 1     Endocrinology:  Hypothyroid Agents Failed - 10/28/2018 10:07 AM      Failed - TSH needs to be rechecked within 3 months after an abnormal result. Refill until TSH is due.      Failed - TSH in normal range and within 360 days    TSH  Date Value Ref Range Status  07/18/2017 4.54 (H) 0.35 - 4.50 uIU/mL Final         Failed - Valid encounter within last 12 months    Recent Outpatient Visits          1 year ago RLQ abdominal pain   [facility] -Georges Mouse, MD   1 year ago Encounter for preventative adult health care exam with abnormal findings   [place], James W, MD   1 year ago Lower abdominal pain   [place], MD   2 years ago Hypothyroidism, unspecified type   [place], MD   3 years ago Right groin pain   [place], FNP      Future Appointments            In 4 months [place] [facility] [place]

## 2018-10-28 NOTE — Telephone Encounter (Signed)
Routing to CMA 

## 2018-10-28 NOTE — Telephone Encounter (Signed)
Medication Refill - Medication:   NP THYROID 30 MG tablet     Pharmacy request on behalf of pt.  Please call pharmacy if refill is denied so they can notify pt.       [facility] [id number] - [place], [place] [place] [phone] (Phone) [phone] (Fax)

## 2018-10-30 ENCOUNTER — Other Ambulatory Visit: Payer: Self-pay

## 2018-10-30 ENCOUNTER — Encounter: Payer: Self-pay | Admitting: Internal Medicine

## 2018-10-30 ENCOUNTER — Encounter: Payer: Self-pay | Admitting: Gynecology

## 2018-10-30 ENCOUNTER — Other Ambulatory Visit (INDEPENDENT_AMBULATORY_CARE_PROVIDER_SITE_OTHER): Payer: Medicare Other

## 2018-10-30 ENCOUNTER — Ambulatory Visit (INDEPENDENT_AMBULATORY_CARE_PROVIDER_SITE_OTHER): Payer: Medicare Other | Admitting: Internal Medicine

## 2018-10-30 VITALS — BP 124/78 | HR 111 | Temp 98.7°F | Ht 64.0 in | Wt 224.0 lb

## 2018-10-30 DIAGNOSIS — E611 Iron deficiency: Secondary | ICD-10-CM

## 2018-10-30 DIAGNOSIS — R739 Hyperglycemia, unspecified: Secondary | ICD-10-CM

## 2018-10-30 DIAGNOSIS — M2550 Pain in unspecified joint: Secondary | ICD-10-CM | POA: Insufficient documentation

## 2018-10-30 DIAGNOSIS — M25562 Pain in left knee: Secondary | ICD-10-CM

## 2018-10-30 DIAGNOSIS — E538 Deficiency of other specified B group vitamins: Secondary | ICD-10-CM

## 2018-10-30 DIAGNOSIS — M25561 Pain in right knee: Secondary | ICD-10-CM | POA: Insufficient documentation

## 2018-10-30 DIAGNOSIS — Z23 Encounter for immunization: Secondary | ICD-10-CM | POA: Diagnosis not present

## 2018-10-30 DIAGNOSIS — E559 Vitamin D deficiency, unspecified: Secondary | ICD-10-CM

## 2018-10-30 DIAGNOSIS — Z0001 Encounter for general adult medical examination with abnormal findings: Secondary | ICD-10-CM

## 2018-10-30 DIAGNOSIS — Z0000 Encounter for general adult medical examination without abnormal findings: Secondary | ICD-10-CM

## 2018-10-30 DIAGNOSIS — G8929 Other chronic pain: Secondary | ICD-10-CM

## 2018-10-30 DIAGNOSIS — E785 Hyperlipidemia, unspecified: Secondary | ICD-10-CM

## 2018-10-30 LAB — CBC WITH DIFFERENTIAL/PLATELET
Basophils Absolute: 0.1 10*3/uL (ref 0.0–0.1)
Basophils Relative: 0.8 % (ref 0.0–3.0)
Eosinophils Absolute: 0 10*3/uL (ref 0.0–0.7)
Eosinophils Relative: 0.4 % (ref 0.0–5.0)
HCT: 51 % — ABNORMAL HIGH (ref 36.0–46.0)
Hemoglobin: 16.8 g/dL — ABNORMAL HIGH (ref 12.0–15.0)
Lymphocytes Relative: 22.7 % (ref 12.0–46.0)
Lymphs Abs: 2.3 10*3/uL (ref 0.7–4.0)
MCHC: 32.9 g/dL (ref 30.0–36.0)
MCV: 88.4 fl (ref 78.0–100.0)
Monocytes Absolute: 0.5 10*3/uL (ref 0.1–1.0)
Monocytes Relative: 4.8 % (ref 3.0–12.0)
Neutro Abs: 7.3 10*3/uL (ref 1.4–7.7)
Neutrophils Relative %: 71.3 % (ref 43.0–77.0)
Platelets: 368 10*3/uL (ref 150.0–400.0)
RBC: 5.77 Mil/uL — ABNORMAL HIGH (ref 3.87–5.11)
RDW: 14.6 % (ref 11.5–15.5)
WBC: 10.2 10*3/uL (ref 4.0–10.5)

## 2018-10-30 LAB — LIPID PANEL
Cholesterol: 262 mg/dL — ABNORMAL HIGH (ref 0–200)
HDL: 57 mg/dL (ref >39.00)
LDL Cholesterol: 180 mg/dL — ABNORMAL HIGH (ref 0–99)
NonHDL: 204.81
Total CHOL/HDL Ratio: 5
Triglycerides: 123 mg/dL (ref 0.0–149.0)
VLDL: 24.6 mg/dL (ref 0.0–40.0)

## 2018-10-30 LAB — HEPATIC FUNCTION PANEL
ALT: 13 U/L (ref 0–35)
AST: 18 U/L (ref 0–37)
Albumin: 4.3 g/dL (ref 3.5–5.2)
Alkaline Phosphatase: 75 U/L (ref 39–117)
Bilirubin, Direct: 0.1 mg/dL (ref 0.0–0.3)
Total Bilirubin: 0.5 mg/dL (ref 0.2–1.2)
Total Protein: 8.4 g/dL — ABNORMAL HIGH (ref 6.0–8.3)

## 2018-10-30 LAB — BASIC METABOLIC PANEL
BUN: 16 mg/dL (ref 6–23)
CO2: 27 mEq/L (ref 19–32)
Calcium: 9.9 mg/dL (ref 8.4–10.5)
Chloride: 103 mEq/L (ref 96–112)
Creatinine, Ser: 1.27 mg/dL — ABNORMAL HIGH (ref 0.40–1.20)
GFR: 50.95 mL/min — ABNORMAL LOW (ref >60.00)
Glucose, Bld: 111 mg/dL — ABNORMAL HIGH (ref 70–99)
Potassium: 3.9 mEq/L (ref 3.5–5.1)
Sodium: 141 mEq/L (ref 135–145)

## 2018-10-30 LAB — IBC PANEL
Iron: 58 ug/dL (ref 42–145)
Saturation Ratios: 17.7 % — ABNORMAL LOW (ref 20.0–50.0)
Transferrin: 234 mg/dL (ref 212.0–360.0)

## 2018-10-30 LAB — VITAMIN D 25 HYDROXY (VIT D DEFICIENCY, FRACTURES): VITD: 38.55 ng/mL (ref 30.00–100.00)

## 2018-10-30 LAB — VITAMIN B12: Vitamin B-12: 251 pg/mL (ref 211–911)

## 2018-10-30 LAB — C-REACTIVE PROTEIN: CRP: 2.2 mg/dL (ref 0.5–20.0)

## 2018-10-30 LAB — SEDIMENTATION RATE: Sed Rate: 32 mm/hr — ABNORMAL HIGH (ref 0–30)

## 2018-10-30 LAB — TSH: TSH: 3.54 u[IU]/mL (ref 0.35–4.50)

## 2018-10-30 LAB — URIC ACID: Uric Acid, Serum: 5.3 mg/dL (ref 2.4–7.0)

## 2018-10-30 MED ORDER — HYDROCHLOROTHIAZIDE 25 MG PO TABS: 25.0000 mg | ORAL_TABLET | Freq: Every day | ORAL | 3 refills | Status: DC | PRN

## 2018-10-30 NOTE — Assessment & Plan Note (Addendum)
Non specific exam , for rheum labs as ordered,  to f/u any worsening symptoms or concerns  In addition to the time spent performing CPE, I spent an additional 25 minutes face to face,in which greater than 50% of this time was spent in counseling and coordination of care for patient's acute illness as documented, including the differential dx, treatment, further evaluation and other management of polyarthralgia, bilat knee pain, HLD, hyperglycemia

## 2018-10-30 NOTE — Progress Notes (Signed)
Subjective:    Patient ID: [name], female    DOB: 1950-03-17, 68 y.o.   MRN: [id number]  HPI  Here for wellness and f/u;  Overall doing ok;  Pt denies Chest pain, worsening SOB, DOE, wheezing, orthopnea, PND, worsening LE edema, palpitations, dizziness or syncope.  Pt denies neurological change such as new headache, facial or extremity weakness.  Pt denies polydipsia, polyuria, or low sugar symptoms. Pt states overall good compliance with treatment and medications, good tolerability, and has been trying to follow appropriate diet.  Pt denies worsening depressive symptoms, suicidal ideation or panic. No fever, night sweats, wt loss, loss of appetite, or other constitutional symptoms.  Pt states good ability with ADL's, has low fall risk, home safety reviewed and adequate, no other significant changes in hearing or vision, and not active with exercise. Did the cologuard.   Pt continues to have recurring LBP without change in severity, bowel or bladder change, fever, wt loss,  worsening LE pain/numbness/weakness, gait change or falls, but has severe chronic knee pain with DJD on top of hip pain s/p THR as well.   Finished ESI for the back but not help.  Also with gel shots to the knees per rheum but not working this time. Sitting down and getting back up is very problematic, as well as walking.  Also has RLE pain from the hip to the distal leg.  Also has some right hand cramping it seems.  Still has fentanyl patch and nsaid for chronic pain.  Has tried emu, voltaren gel without help.  Pt asks for repeat rheum testing for further r/o. Past Medical History:  Diagnosis Date  . Anxiety   . Arthritis   . Asthma   . Asthma   . Biliary dyskinesia   . Bowel obstruction ([place])   . BRCA negative 02/2013  . Cervical dysplasia   . Complication of anesthesia    "I have the shakes"  . Depression   . Diverticulosis   . Elevated cholesterol   . Endometriosis   . Fibromyalgia   . GERD  (gastroesophageal reflux disease)   . Heart murmur    mvp  . Hx of oral aphthous ulcers   . Hypertension   . Hypertension    no med  . Hypothyroidism    hx nodule on thyroid   . Impaired glucose tolerance 02/25/2011  . Low back pain   . Migraine   . Migraines   . Nocturia   . Polycythemia vera ([place])   . Shingles   . Shortness of breath    with exertion  . Sickle cell trait ([place])   . Sleep apnea    has not used c-pap x 1 yr. use mouthguard now  . Thyroid disease   . Thyroid disease    Hypothyroid   Past Surgical History:  Procedure Laterality Date  . ABDOMINAL HYSTERECTOMY    . BREAST EXCISIONAL BIOPSY Left 1999  . BREAST SURGERY     Cystectomy on Left breast  . BREAST SURGERY     Benign breast cyst  . CHOLECYSTECTOMY    . CHOLECYSTECTOMY  12/05/2011   Procedure: LAPAROSCOPIC CHOLECYSTECTOMY;  Surgeon: Ralene Ok, MD;  Location: [facility];  Service: General;  Laterality: N/A;  . COLPOSCOPY    . GYNECOLOGIC CRYOSURGERY    . HERNIA REPAIR     abdomen  . JOINT REPLACEMENT Right    hip  . OOPHORECTOMY     BSO  . PANNICULECTOMY N/A 06/12/2014  Procedure: PANNICULECTOMY;  Surgeon: Irene Limbo, MD;  Location: [place];  Service: Plastics;  Laterality: N/A;  . PELVIC LAPAROSCOPY  1995,2002   DL LSO DL RSO  . TONSILLECTOMY    . TOTAL HIP ARTHROPLASTY     Right  . VAGINAL HYSTERECTOMY  1983    reports that she has never smoked. She has never used smokeless tobacco. She reports current alcohol use. She reports that she does not use drugs. family history includes Arthritis in her father; Bone cancer in her mother; Breast cancer in her maternal aunt, maternal aunt, maternal grandmother, mother, and paternal grandmother; Breast cancer (age of onset: 62) in her sister; Colon cancer in her sister; Colon cancer (age of onset: 19) in her sister; Diabetes in her father; Heart disease in her father and mother; Heart failure in her father; Hypertension in her  father and mother; Multiple sclerosis in her cousin; Ovarian cancer in her paternal grandmother; Sarcoidosis in her brother, sister, and sister. Allergies  Allergen Reactions  . Celecoxib Nausea Only and Nausea And Vomiting  . Codeine Nausea Only    REACTION: Nausea  . Codeine Nausea And Vomiting  . Duloxetine Nausea And Vomiting    REACTION: n/v REACTION: n/v  . Erythromycin     REACTION: Itching  . Escitalopram Oxalate Nausea Only  . Ezetimibe Nausea And Vomiting  . Gluten Meal     Intolerant to gluten  . Lactose Intolerance ([place])   . Lyrica [Pregabalin] Nausea And Vomiting  . Milnacipran Nausea And Vomiting    REACTION: n/v REACTION: n/v  . Pregabalin Itching and Nausea And Vomiting    REACTION: wt gain REACTION: wt gain  . Rofecoxib Nausea And Vomiting  . Statins Nausea And Vomiting    Body aches  . Statins     Body aches  . Tramadol Nausea Only  . Ultram [Tramadol Hcl] Nausea And Vomiting  . Celebrex [Celecoxib] Swelling and Rash  . Contrast Media [Iodinated Diagnostic Agents] Hives    Patient has history of hives from Ionic contrast injection in 1980's. No allergy to Non-Ionic contrast. 05-09-2014 rsm   Current Outpatient Medications on File Prior to Visit  Medication Sig Dispense Refill  . albuterol (PROVENTIL HFA;VENTOLIN HFA) 108 (90 Base) MCG/ACT inhaler Inhale 2 puffs into the lungs every 6 (six) hours as needed for wheezing. For wheezing 3.7 g 11  . amLODipine (NORVASC) 5 MG tablet Take 1 tablet (5 mg total) by mouth daily. 90 tablet 3  . aspirin EC 81 MG EC tablet Take 1 tablet (81 mg total) by mouth daily. 30 tablet 0  . Calcium Carbonate-Vitamin D (CALCIUM + D PO) Take 2 tablets by mouth at bedtime.     . cyclobenzaprine (FLEXERIL) 5 MG tablet Take 5 mg by mouth as needed for muscle spasms.    . diclofenac (VOLTAREN) 75 MG EC tablet TK 1 T PO BID  1  . diclofenac sodium (VOLTAREN) 1 % GEL Apply 3 grams to 3 large joints up to 3 times daily. 3 Tube 3  .  dicyclomine (BENTYL) 20 MG tablet Take 1 tablet (20 mg total) by mouth 2 (two) times daily. (Patient taking differently: Take 20 mg by mouth as needed. ) 60 tablet 1  . estradiol (VIVELLE-DOT) 0.05 MG/24HR patch Place 1 patch (0.05 mg total) onto the skin 2 (two) times a week. 8 patch 12  . fentaNYL (DURAGESIC - DOSED MCG/HR) 25 MCG/HR Place 1 patch onto the skin every 3 (three) days.    Marland Kitchen  fluticasone (FLONASE) 50 MCG/ACT nasal spray Place 2 sprays into both nostrils as needed. (Patient taking differently: Place 2 sprays into both nostrils as needed for allergies or rhinitis. ) 16 g 11  . lidocaine (LIDODERM) 5 % Place 1 patch onto the skin daily. Remove & Discard patch within 12 hours or as directed by MD 30 patch 1  . NONFORMULARY OR COMPOUNDED ITEM Boric Acid Capsules 600 mg. #30 Insert one cap intravaginally twice weekly. 30 each 2  . polyethylene glycol powder (MIRALAX) powder Take 17 g by mouth as needed. For constipation 255 g 0  . thyroid (NP THYROID) 30 MG tablet TAKE 1 TABLET(30 MG) BY MOUTH DAILY BEFORE BREAKFAST 90 tablet 1  . topiramate (TOPAMAX) 50 MG tablet TAKE 1 TABLET(50 MG) BY MOUTH TWICE DAILY 60 tablet 0   No current facility-administered medications on file prior to visit.    Review of Systems  Constitutional: Negative for other unusual diaphoresis or sweats HENT: Negative for ear discharge or swelling Eyes: Negative for other worsening visual disturbances Respiratory: Negative for stridor or other swelling  Gastrointestinal: Negative for worsening distension or other blood Genitourinary: Negative for retention or other urinary change Musculoskeletal: Negative for other MSK pain or swelling Skin: Negative for color change or other new lesions Neurological: Negative for worsening tremors and other numbness  Psychiatric/Behavioral: Negative for worsening agitation or other fatigue All otherwise neg per pt     Objective:   Physical Exam BP 124/78   Pulse (!) 111    Temp 98.7 F (37.1 C) (Oral)   Ht _0  (1.626 m)   Wt 224 lb (101.6 kg)   SpO2 97%   BMI 38.45 kg/m  VS noted, obese Constitutional: Pt is oriented to person, place, and time. Appears well-developed and well-nourished, in no significant distress and comfortable Head: Normocephalic and atraumatic  Eyes: Conjunctivae and EOM are normal. Pupils are equal, round, and reactive to light Right Ear: External ear normal without discharge Left Ear: External ear normal without discharge Nose: Nose without discharge or deformity Mouth/Throat: Oropharynx is without other ulcerations and moist  Neck: Normal range of motion. Neck supple. No JVD present. No tracheal deviation present or significant neck LA or mass Cardiovascular: Normal rate, regular rhythm, normal heart sounds and intact distal pulses.   Pulmonary/Chest: WOB normal and breath sounds without rales or wheezing  Abdominal: Soft. Bowel sounds are normal. NT. No HSM  Musculoskeletal: Normal range of motion. Exhibits no edema except for stiff back with walking and bilat severe knee pain with ambualtion Lymphadenopathy: Has no other cervical adenopathy.  Neurological: Pt is alert and oriented to person, place, and time. Pt has normal reflexes. No cranial nerve deficit. Motor grossly intact, Gait intact Skin: Skin is warm and dry. No rash noted or new ulcerations Psychiatric:  Has nervous irritable mood and affect. Behavior is normal without agitation All otherwise neg per pt Lab Results  Component Value Date   WBC 10.0 07/18/2017   HGB 16.0 (H) 07/18/2017   HCT 48.2 (H) 07/18/2017   PLT 335.0 07/18/2017   GLUCOSE 103 (H) 07/18/2017   CHOL 248 (H) 07/18/2017   TRIG 100.0 07/18/2017   HDL 62.90 07/18/2017   LDLDIRECT 194.2 05/31/2007   LDLCALC 165 (H) 07/18/2017   ALT 11 07/18/2017   AST 14 07/18/2017   NA 140 07/18/2017   K 3.9 07/18/2017   CL 102 07/18/2017   CREATININE 0.96 07/18/2017   BUN 11 07/18/2017   CO2 34 (H)  07/18/2017   TSH 4.54 (H) 07/18/2017   HGBA1C 5.4 07/18/2017      Assessment & Plan:

## 2018-10-30 NOTE — Assessment & Plan Note (Signed)

## 2018-10-30 NOTE — Patient Instructions (Addendum)
You had the flu shot today, and the Pneumovax pneumonia shot  We will plan on the new pneumonia shot called Prevnar 13 at your next visit  Please continue all other medications as before, and refills have been done if requested.  Please have the pharmacy call with any other refills you may need.  Please continue your efforts at being more active, low cholesterol diet, and weight control.  You are otherwise up to date with prevention measures today.  Please keep your appointments with your specialists as you may have planned  Please go to the LAB in the Basement (turn left off the elevator) for the tests to be done today  You will be contacted by phone if any changes need to be made immediately.  Otherwise, you will receive a letter about your results with an explanation, but please check with MyChart first.  Please remember to sign up for MyChart if you have not done so, as this will be important to you in the future with finding out test results, communicating by private email, and scheduling acute appointments online when needed.  Please return in 6 months, or sooner if needed

## 2018-10-30 NOTE — Assessment & Plan Note (Signed)
Has been statin intolerant in past, for lower chol diet

## 2018-10-30 NOTE — Assessment & Plan Note (Addendum)
Severe, f/u ortho, consider increased fentanyl per pain management, for handicapped parking application signed

## 2018-10-30 NOTE — Assessment & Plan Note (Signed)
stable overall by history and exam, recent data reviewed with pt, and pt to continue medical treatment as before,  to f/u any worsening symptoms or concerns, for a1c with lab 

## 2018-10-31 LAB — URINALYSIS, ROUTINE W REFLEX MICROSCOPIC
Bilirubin Urine: NEGATIVE
Hgb urine dipstick: NEGATIVE
Ketones, ur: NEGATIVE
Leukocytes,Ua: NEGATIVE
Nitrite: NEGATIVE
RBC / HPF: NONE SEEN (ref 0–?)
Specific Gravity, Urine: 1.025 (ref 1.000–1.030)
Urine Glucose: NEGATIVE
Urobilinogen, UA: 0.2 (ref 0.0–1.0)
pH: 5.5 (ref 5.0–8.0)

## 2018-10-31 LAB — HEMOGLOBIN A1C: Hgb A1c MFr Bld: 5.6 % (ref 4.6–6.5)

## 2018-11-01 LAB — CYCLIC CITRUL PEPTIDE ANTIBODY, IGG: Cyclic Citrullin Peptide Ab: <16 UNITS

## 2018-11-01 LAB — RHEUMATOID FACTOR: Rheumatoid fact SerPl-aCnc: <14 IU/mL (ref <14)

## 2018-11-01 LAB — ANA: Anti Nuclear Antibody (ANA): POSITIVE — AB

## 2018-11-01 LAB — ANTI-NUCLEAR AB-TITER (ANA TITER): ANA Titer 1: 1:160 {titer} — ABNORMAL HIGH

## 2018-11-01 LAB — ANGIOTENSIN CONVERTING ENZYME: Angiotensin-Converting Enzyme: 25 U/L (ref 9–67)

## 2018-11-09 ENCOUNTER — Encounter: Payer: Self-pay | Admitting: Internal Medicine

## 2018-11-09 DIAGNOSIS — R768 Other specified abnormal immunological findings in serum: Secondary | ICD-10-CM

## 2018-11-11 ENCOUNTER — Encounter: Payer: Self-pay | Admitting: Internal Medicine

## 2018-11-12 NOTE — Addendum Note (Signed)
Addended by: Biagio Borg on: 11/12/2018 12:45 PM   Modules accepted: Orders

## 2018-11-14 ENCOUNTER — Encounter: Payer: Self-pay | Admitting: Internal Medicine

## 2018-12-05 ENCOUNTER — Other Ambulatory Visit: Payer: Self-pay | Admitting: Rheumatology

## 2018-12-05 DIAGNOSIS — M461 Sacroiliitis, not elsewhere classified: Secondary | ICD-10-CM

## 2018-12-29 ENCOUNTER — Ambulatory Visit
Admission: RE | Admit: 2018-12-29 | Discharge: 2018-12-29 | Disposition: A | Discharge status: Home / Self Care | Payer: Medicare Other | Source: Ambulatory Visit | Attending: Rheumatology | Admitting: Rheumatology

## 2018-12-29 ENCOUNTER — Other Ambulatory Visit: Payer: Self-pay

## 2018-12-29 DIAGNOSIS — M461 Sacroiliitis, not elsewhere classified: Secondary | ICD-10-CM

## 2018-12-29 MED ORDER — GADOBENATE DIMEGLUMINE 529 MG/ML IV SOLN
20.0000 mL | Freq: Once | INTRAVENOUS | Status: AC | PRN
  Administered 2018-12-29: 16:00:00 20 mL via INTRAVENOUS

## 2018-12-31 ENCOUNTER — Other Ambulatory Visit: Payer: Medicare Other

## 2019-01-28 DIAGNOSIS — Z79899 Other long term (current) drug therapy: Secondary | ICD-10-CM | POA: Diagnosis not present

## 2019-01-28 DIAGNOSIS — M2550 Pain in unspecified joint: Secondary | ICD-10-CM | POA: Diagnosis not present

## 2019-01-28 DIAGNOSIS — R768 Other specified abnormal immunological findings in serum: Secondary | ICD-10-CM | POA: Diagnosis not present

## 2019-01-28 DIAGNOSIS — M797 Fibromyalgia: Secondary | ICD-10-CM | POA: Diagnosis not present

## 2019-01-28 DIAGNOSIS — M1990 Unspecified osteoarthritis, unspecified site: Secondary | ICD-10-CM | POA: Diagnosis not present

## 2019-01-30 DIAGNOSIS — M2550 Pain in unspecified joint: Secondary | ICD-10-CM | POA: Diagnosis not present

## 2019-02-19 ENCOUNTER — Telehealth: Payer: Self-pay | Admitting: Rheumatology

## 2019-02-19 NOTE — Telephone Encounter (Signed)
She will need an office visit to get x-rays and examine her knee joints prior to applying for Visco supplement injections.

## 2019-02-19 NOTE — Telephone Encounter (Signed)
Patient would like to have Visco injections again in both knees. Patient had last round in August 2020. Patient still has issues with walking, and knee is popping at times, and moves out of joint, especially the right knee.  Is it okay to apply for injections?

## 2019-02-19 NOTE — Telephone Encounter (Signed)
Patient advised she would need an office visit to have x-rays and evaluate her knees prior to applying for Visco supplementation. Patient states she will keep the appointment she has on 03/20/19 for evaluation and x-rays.

## 2019-02-27 ENCOUNTER — Ambulatory Visit: Payer: Medicare Other | Admitting: Physician Assistant

## 2019-02-27 DIAGNOSIS — M797 Fibromyalgia: Secondary | ICD-10-CM | POA: Diagnosis not present

## 2019-02-27 DIAGNOSIS — M1990 Unspecified osteoarthritis, unspecified site: Secondary | ICD-10-CM | POA: Diagnosis not present

## 2019-02-27 DIAGNOSIS — R768 Other specified abnormal immunological findings in serum: Secondary | ICD-10-CM | POA: Diagnosis not present

## 2019-02-27 DIAGNOSIS — M2550 Pain in unspecified joint: Secondary | ICD-10-CM | POA: Diagnosis not present

## 2019-03-05 DIAGNOSIS — M456 Ankylosing spondylitis lumbar region: Secondary | ICD-10-CM | POA: Diagnosis not present

## 2019-03-05 DIAGNOSIS — M797 Fibromyalgia: Secondary | ICD-10-CM | POA: Diagnosis not present

## 2019-03-05 DIAGNOSIS — M545 Low back pain: Secondary | ICD-10-CM | POA: Diagnosis not present

## 2019-03-17 NOTE — Progress Notes (Signed)
Office Visit Note  Patient: [name]             Date of Birth: [birth year]           MRN: [id number]             PCP: Biagio Borg, MD Referring: Biagio Borg, MD Visit Date: 03/20/2019 Occupation: _0 @  Subjective:  Pain in both knee joints   History of Present Illness: Trivia Heffelfinger is a 69 y.o. female with history of fibromyalgia, osteoarthritis, and osteoporosis.  She presents today with severe pain in both knee joints.  She walks with a cane to assist with ambulation.  She continues to have chronic lower back pain.  According to the patient she was seen at [facility] rheumatology by Dr. Kathlene November, and she was diagnosed with Ankylosing spondylitis and was started on Arava 20 mg 1 tablet daily.  She uses a fentanyl patch and takes flexeril 5 mg po as needed for muscle spasms.    Activities of Daily Living:  Patient reports joint stiffness all day  Patient Reports nocturnal pain.  Difficulty dressing/grooming: Denies Difficulty climbing stairs: Reports Difficulty getting out of chair: Reports Difficulty using hands for taps, buttons, cutlery, and/or writing: Reports  Review of Systems  Constitutional: Positive for fatigue.  HENT: Positive for mouth dryness and nose dryness. Negative for mouth sores.   Eyes: Positive for dryness. Negative for pain, itching and visual disturbance.  Respiratory: Negative for cough, hemoptysis, shortness of breath and difficulty breathing.   Cardiovascular: Negative for chest pain, palpitations, hypertension and swelling in legs/feet.  Gastrointestinal: Positive for constipation and diarrhea. Negative for blood in stool.  Endocrine: Negative for increased urination.  Genitourinary: Negative for painful urination.  Musculoskeletal: Positive for arthralgias, joint pain, joint swelling, myalgias, muscle weakness, morning stiffness, muscle tenderness and myalgias.  Skin: Negative for color change, pallor, rash, hair loss,  nodules/bumps, skin tightness, ulcers and sensitivity to sunlight.  Allergic/Immunologic: Negative for susceptible to infections.  Neurological: Positive for headaches. Negative for dizziness, numbness and memory loss.  Hematological: Negative for swollen glands.  Psychiatric/Behavioral: Negative for depressed mood, confusion and sleep disturbance. The patient is not nervous/anxious.     PMFS History:  Patient Active Problem List   Diagnosis Date Noted  . Polyarthralgia 10/30/2018  . Bilateral knee pain 10/30/2018  . RLQ abdominal pain 07/18/2017  . Hyperglycemia 07/18/2017  . Right lumbar radiculopathy 05/17/2017  . Abdominal pain, epigastric 05/17/2017  . Hematoma 05/17/2017  . Right leg pain 11/09/2016  . Left ear hearing loss 11/09/2016  . Right hip pain 03/17/2016  . Right groin pain 08/19/2015  . Diarrhea 07/22/2015  . Right low back pain 07/22/2015  . Left lower quadrant pain 08/21/2014  . Bloating 08/21/2014  . Nausea without vomiting 08/21/2014  . Panniculitis 06/12/2014  . Genetic testing 06/04/2014  . Family history of breast cancer 06/04/2014  . Peripheral edema 04/29/2014  . Bilateral leg edema 04/15/2014  . Mouth pain 03/13/2014  . Pain of right great toe 03/13/2014  . COPD exacerbation ([place]) 02/06/2014  . Shingles outbreak 12/30/2013  . Thrush 12/30/2013  . Trigger point of thoracic region 12/30/2013  . Migraine headache 11/13/2013  . LUQ pain 05/08/2013  . Polycythemia, secondary 09/22/2012  . Chest pain 03/27/2012  . Cervical dysplasia   . Spell of dizziness 10/14/2011  . Bruising 10/14/2011  . High risk medication use 10/14/2011  . Lesion of mouth 05/30/2011  . Chronic sinusitis 05/30/2011  . Encounter  for preventative adult health care exam with abnormal findings 02/25/2011  . Impaired glucose tolerance 02/25/2011  . Endometriosis   . OTHER&UNSPECIFIED DISEASES THE ORAL SOFT TISSUES 03/30/2010  . Family hx of colon cancer 03/11/2010  . THYROID  NODULE, RIGHT 03/01/2010  . BACK PAIN 03/01/2010  . GOITER 10/26/2008  . CONSTIPATION 11/18/2007  . LUMBAR RADICULOPATHY, LEFT 11/07/2007  . Hypothyroidism 05/31/2007  . ANXIETY 05/31/2007  . ASTHMA 05/31/2007  . COPD (chronic obstructive pulmonary disease) ([place]) 05/31/2007  . COLONIC POLYPS, HX OF 05/31/2007  . HLD (hyperlipidemia) 09/22/2006  . Obesity 09/22/2006  . SICKLE CELL TRAIT 09/22/2006  . Depression 09/22/2006  . OSA (obstructive sleep apnea) 09/22/2006  . Essential hypertension 09/22/2006  . VENOUS INSUFFICIENCY 09/22/2006  . ALLERGIC RHINITIS 09/22/2006  . GERD 09/22/2006  . PEPTIC ULCER DISEASE 09/22/2006  . Irritable bowel syndrome 09/22/2006  . OSTEOARTHRITIS 09/22/2006  . FIBROMYALGIA 09/22/2006  . OSTEOPOROSIS 09/22/2006  . FATIGUE, CHRONIC 09/22/2006  . PERIPHERAL EDEMA 09/22/2006  . MITRAL VALVE PROLAPSE, HX OF 09/22/2006    Past Medical History:  Diagnosis Date  . Anxiety   . Arthritis   . Asthma   . Asthma   . Biliary dyskinesia   . Bowel obstruction ([place])   . BRCA negative 02/2013  . Cervical dysplasia   . Complication of anesthesia    "I have the shakes"  . Depression   . Diverticulosis   . Elevated cholesterol   . Endometriosis   . Fibromyalgia   . GERD (gastroesophageal reflux disease)   . Heart murmur    mvp  . Hx of oral aphthous ulcers   . Hypertension   . Hypertension    no med  . Hypothyroidism    hx nodule on thyroid   . Impaired glucose tolerance 02/25/2011  . Low back pain   . Migraine   . Migraines   . Nocturia   . Polycythemia vera ([place])   . Shingles   . Shortness of breath    with exertion  . Sickle cell trait ([place])   . Sleep apnea    has not used c-pap x 1 yr. use mouthguard now  . Thyroid disease   . Thyroid disease    Hypothyroid    Family History  Problem Relation Age of Onset  . Hypertension Mother   . Heart disease Mother   . Breast cancer Mother        Age 94  . Bone cancer Mother   . Hypertension  Father   . Heart disease Father   . Diabetes Father   . Colon cancer Sister 46  . Sarcoidosis Sister   . Breast cancer Sister 26  . Breast cancer Maternal Grandmother        Age 1  . Ovarian cancer Paternal Grandmother   . Breast cancer Paternal Grandmother        Age unknown  . Sarcoidosis Brother   . Breast cancer Maternal Aunt        Age 67's  . Breast cancer Maternal Aunt        Age 75  . Heart failure Father   . Arthritis Father   . Colon cancer Sister   . Sarcoidosis Sister   . Multiple sclerosis Cousin    Past Surgical History:  Procedure Laterality Date  . ABDOMINAL HYSTERECTOMY    . BREAST EXCISIONAL BIOPSY Left 1999  . BREAST SURGERY     Cystectomy on Left breast  . BREAST SURGERY  Benign breast cyst  . CHOLECYSTECTOMY    . CHOLECYSTECTOMY  12/05/2011   Procedure: LAPAROSCOPIC CHOLECYSTECTOMY;  Surgeon: Ralene Ok, MD;  Location: [facility];  Service: General;  Laterality: N/A;  . COLPOSCOPY    . GYNECOLOGIC CRYOSURGERY    . HERNIA REPAIR     abdomen  . JOINT REPLACEMENT Right    hip  . OOPHORECTOMY     BSO  . PANNICULECTOMY N/A 06/12/2014   Procedure: PANNICULECTOMY;  Surgeon: Irene Limbo, MD;  Location: [place];  Service: Plastics;  Laterality: N/A;  . PELVIC LAPAROSCOPY  1995,2002   DL LSO DL RSO  . TONSILLECTOMY    . TOTAL HIP ARTHROPLASTY     Right  . VAGINAL HYSTERECTOMY  1983   Social History   Social History Narrative   Lives at home alone.   Right-handed.   6 cups per week.   Immunization History  Administered Date(s) Administered  . Fluad Quad(high Dose 65+) 10/30/2018  . Influenza Split 10/27/2011  . Influenza Whole 10/26/2008  . Influenza-Unspecified 09/23/2012, 10/23/2017  . Pneumococcal Polysaccharide-23 01/24/2004, 10/30/2018     Objective: Vital Signs: BP 134/88 (BP Location: Right Arm, Patient Position: Sitting, Cuff Size: Normal)   Pulse 81   Resp 16   Ht 5' 4" (1.626 m)   Wt 227 lb 9.6 oz  (103.2 kg)   BMI 39.07 kg/m    Physical Exam Vitals and nursing note reviewed.  Constitutional:      Appearance: She is well-developed.  HENT:     Head: Normocephalic and atraumatic.  Eyes:     Conjunctiva/sclera: Conjunctivae normal.  Pulmonary:     Effort: Pulmonary effort is normal.  Abdominal:     General: Bowel sounds are normal.     Palpations: Abdomen is soft.  Musculoskeletal:     Cervical back: Normal range of motion.  Lymphadenopathy:     Cervical: No cervical adenopathy.  Skin:    General: Skin is warm and dry.     Capillary Refill: Capillary refill takes less than 2 seconds.  Neurological:     Mental Status: She is alert and oriented to person, place, and time.  Psychiatric:        Behavior: Behavior normal.      Musculoskeletal Exam: Generalized hyperalgesia and positive tender points.  Shoulder joints, elbow joints, wrist joints, MCPs, PIPs, and DIPs good ROM with no synovitis. Right hip replacement has good ROM with some discomfort.  Knee joints have painful ROM.  No warmth or effusion of knee joints.  Ankle joints good ROM with no tenderness or inflammation.    CDAI Exam: CDAI Score: -- Patient Global: --; Provider Global: -- Swollen: --; Tender: -- Joint Exam 03/20/2019   No joint exam has been documented for this visit   There is currently no information documented on the homunculus. Go to the Rheumatology activity and complete the homunculus joint exam.  Investigation: No additional findings.  Imaging: XR KNEE 3 VIEW LEFT  Result Date: 03/20/2019 Severe medial compartment narrowing with intercondylar osteophytes were noted.  Lateral osteophytes were noted as well.  No chondrocalcinosis was noted.  Severe patellofemoral narrowing was noted. Impression: These findings are consistent with severe osteoarthritis and severe chondromalacia patella.  XR KNEE 3 VIEW RIGHT  Result Date: 03/20/2019 Severe lateral compartment narrowing with lateral  osteophytes and intercondylar osteophytes were noted.  Severe patellofemoral narrowing was noted.  No chondrocalcinosis was noted. Impression: These findings are consistent with severe osteoarthritis and severe chondromalacia  patella.   Recent Labs: Lab Results  Component Value Date   WBC 10.2 10/30/2018   HGB 16.8 (H) 10/30/2018   PLT 368.0 10/30/2018   NA 141 10/30/2018   K 3.9 10/30/2018   CL 103 10/30/2018   CO2 27 10/30/2018   GLUCOSE 111 (H) 10/30/2018   BUN 16 10/30/2018   CREATININE 1.27 (H) 10/30/2018   BILITOT 0.5 10/30/2018   ALKPHOS 75 10/30/2018   AST 18 10/30/2018   ALT 13 10/30/2018   PROT 8.4 (H) 10/30/2018   ALBUMIN 4.3 10/30/2018   CALCIUM 9.9 10/30/2018   GFRAA >60 09/05/2015    Speciality Comments: No specialty comments available.  Procedures:  Large Joint Inj: bilateral knee on 03/20/2019 3:16 PM Indications: pain Details: 27 G 1.5 in needle, medial approach  Arthrogram: No  Medications (Right): 1.5 mL lidocaine 1 %; 40 mg triamcinolone acetonide 40 MG/ML Aspirate (Right): 0 mL Medications (Left): 1.5 mL lidocaine 1 %; 40 mg triamcinolone acetonide 40 MG/ML Aspirate (Left): 0 mL Outcome: tolerated well, no immediate complications Procedure, treatment alternatives, risks and benefits explained, specific risks discussed. Consent was given by the patient. Immediately prior to procedure a time out was called to verify the correct patient, procedure, equipment, support staff and site/side marked as required. Patient was prepped and draped in the usual sterile fashion.     Allergies: Celecoxib, Codeine, Codeine, Duloxetine, Erythromycin, Escitalopram oxalate, Ezetimibe, Gluten meal, Lactose intolerance (gi), Lyrica [pregabalin], Milnacipran, Pregabalin, Rofecoxib, Statins, Statins, Tramadol, Ultram [tramadol hcl], Celebrex [celecoxib], and Contrast media [iodinated diagnostic agents]        Assessment / Plan:     Visit Diagnoses: Fibromyalgia: She has  generalized hyperalgesia and positive tender points on exam. She continues to have generalized muscle aches and muscle tenderness due to fibromyalgia.  She is having trapezius muscle tension and tenderness bilaterally.  She uses a fentanyl patch and flexeril 5 mg prn for muscle spasms and pain relief.  She continues to have chronic fatigue secondary to insomnia.  She has very interrupted sleep at night due to nocturnal pain. She has difficulty exercising due to her generalized pain. She will follow up as needed.     Chronic pain of both knees -She has chronic pain in both knee joints.  She has difficulty with ambulation due to the discomfort.  She has difficulty climbing steps and getting up from a chair. She uses a cane to assist with ambulation.  X-rays of both knee joints were updated today. X-rays were consistent with severe osteoarthritis of both knee joints. She will require a total knee arthroplasties in the future. She is not ready to proceed with surgery at this time.  She requested bilateral cortisone knee joint injections today.  She tolerated procedure well.  The procedure note was completed above.  Plan: XR KNEE 3 VIEW RIGHT, XR KNEE 3 VIEW LEFT  Primary osteoarthritis of both knees - s/p euflexxa bilateral knees 07/2018-08/2018.  She has chronic pain in both knee joints.  X-rays of both knee joints were updated today.  She has severe osteoarthritis of both knee joints, and she will require bilateral knee arthoplasties. She requested cortisone injections today.  She tolerated the procedure well.  Aftercare was discussed.- Plan: XR KNEE 3 VIEW RIGHT, XR KNEE 3 VIEW LEFT  History of total right hip arthroplasty: She has chronic pain in the right hip, which is replaced. She has nocturnal pain when laying down at night.  She uses a cane to assist with ambulation.  DDD (degenerative disc disease), lumbar: She has chronic lower back pain.  According to the patient she was evaluated by Dr. Kathlene November at  [facility], and she was diagnosed with ankylosing spondylitis.  She was started on Arava 20 mg 1 tablet daily. She has been taking Arava for 1.5 months and has noticed minimal improvement.  She has been experiencing nausea daily since starting on Arava.  She will following up with Dr. Kathlene November in April 2021.   MRI of the sacrum was ordered on 12/29/18 and was reviewed today in the office.  Findings revealed mild osteoarthritis of bilateral sacroiliac joints and L5-S1 mild broad-based disc bulge.  Moderate bilateral facet arthropathy.  X-rays of the lumbar spine from 02/22/18 were also reviewed with the patient today.   Age-related osteoporosis without current pathological fracture - Managed by her PCP.  She takes a calcium and vitamin D supplement daily.  She was previously treated with Reclast. Most recent DEXA on 07/25/2018 normal.  Other fatigue: She has chronic fatigue secondary to insomnia.  Other insomnia: She experiences nocturnal pain, which causes interrupted sleep at night.   Other medical conditions are listed as follows:   Hx of migraines  History of diverticulosis  History of sleep apnea  History of gastroesophageal reflux (GERD)  Anxiety and depression  Essential hypertension  History of COPD  History of hypothyroidism  History of asthma  Polycythemia, secondary  MVP (mitral valve prolapse)  Orders: Orders Placed This Encounter  Procedures  . Large Joint Inj  . XR KNEE 3 VIEW RIGHT  . XR KNEE 3 VIEW LEFT   Meds ordered this encounter  Medications  . lidocaine (LIDODERM) 5 %    Sig: Place 1 patch onto the skin daily. Remove & Discard patch within 12 hours or as directed by MD    Dispense:  30 patch    Refill:  1    Face-to-face time spent with patient was 30 minutes. Greater than 50% of time was spent in counseling and coordination of care.  Follow-Up Instructions: Return if symptoms worsen or fail to improve, for Fibromyalgia, DDD, Osteoarthritis.   Ofilia Neas,  PA-C  Note - This record has been created using Dragon software.  Chart creation errors have been sought, but may not always  have been located. Such creation errors do not reflect on  the standard of medical care.

## 2019-03-20 ENCOUNTER — Ambulatory Visit (INDEPENDENT_AMBULATORY_CARE_PROVIDER_SITE_OTHER): Payer: Medicare PPO

## 2019-03-20 ENCOUNTER — Encounter: Payer: Self-pay | Admitting: Physician Assistant

## 2019-03-20 ENCOUNTER — Telehealth: Payer: Self-pay | Admitting: *Deleted

## 2019-03-20 ENCOUNTER — Other Ambulatory Visit: Payer: Self-pay

## 2019-03-20 ENCOUNTER — Ambulatory Visit: Payer: Medicare Other | Admitting: Physician Assistant

## 2019-03-20 ENCOUNTER — Ambulatory Visit: Payer: Self-pay

## 2019-03-20 VITALS — BP 134/88 | HR 81 | Resp 16 | Ht 64.0 in | Wt 227.6 lb

## 2019-03-20 DIAGNOSIS — M25561 Pain in right knee: Secondary | ICD-10-CM

## 2019-03-20 DIAGNOSIS — Z8709 Personal history of other diseases of the respiratory system: Secondary | ICD-10-CM

## 2019-03-20 DIAGNOSIS — M810 Age-related osteoporosis without current pathological fracture: Secondary | ICD-10-CM

## 2019-03-20 DIAGNOSIS — G4709 Other insomnia: Secondary | ICD-10-CM

## 2019-03-20 DIAGNOSIS — R5383 Other fatigue: Secondary | ICD-10-CM

## 2019-03-20 DIAGNOSIS — G8929 Other chronic pain: Secondary | ICD-10-CM

## 2019-03-20 DIAGNOSIS — M25562 Pain in left knee: Secondary | ICD-10-CM

## 2019-03-20 DIAGNOSIS — M170 Bilateral primary osteoarthritis of knee: Secondary | ICD-10-CM | POA: Diagnosis not present

## 2019-03-20 DIAGNOSIS — Z8719 Personal history of other diseases of the digestive system: Secondary | ICD-10-CM

## 2019-03-20 DIAGNOSIS — M51369 Other intervertebral disc degeneration, lumbar region without mention of lumbar back pain or lower extremity pain: Secondary | ICD-10-CM

## 2019-03-20 DIAGNOSIS — Z96641 Presence of right artificial hip joint: Secondary | ICD-10-CM | POA: Diagnosis not present

## 2019-03-20 DIAGNOSIS — I10 Essential (primary) hypertension: Secondary | ICD-10-CM

## 2019-03-20 DIAGNOSIS — Z8669 Personal history of other diseases of the nervous system and sense organs: Secondary | ICD-10-CM | POA: Diagnosis not present

## 2019-03-20 DIAGNOSIS — Z8639 Personal history of other endocrine, nutritional and metabolic disease: Secondary | ICD-10-CM

## 2019-03-20 DIAGNOSIS — F32A Depression, unspecified: Secondary | ICD-10-CM

## 2019-03-20 DIAGNOSIS — M797 Fibromyalgia: Secondary | ICD-10-CM

## 2019-03-20 DIAGNOSIS — D751 Secondary polycythemia: Secondary | ICD-10-CM

## 2019-03-20 DIAGNOSIS — M5136 Other intervertebral disc degeneration, lumbar region: Secondary | ICD-10-CM | POA: Diagnosis not present

## 2019-03-20 DIAGNOSIS — F419 Anxiety disorder, unspecified: Secondary | ICD-10-CM

## 2019-03-20 DIAGNOSIS — I341 Nonrheumatic mitral (valve) prolapse: Secondary | ICD-10-CM

## 2019-03-20 DIAGNOSIS — F329 Major depressive disorder, single episode, unspecified: Secondary | ICD-10-CM

## 2019-03-20 MED ORDER — LIDOCAINE 5 % EX PTCH: 1.0000 | MEDICATED_PATCH | CUTANEOUS | 1 refills | Status: DC

## 2019-03-20 NOTE — Telephone Encounter (Signed)
Submitted a Prior Authorization request to [employer] for Lidocaine Patches  via Cover My Meds. Will update once we receive a response.      

## 2019-03-21 ENCOUNTER — Telehealth: Payer: Self-pay | Admitting: *Deleted

## 2019-03-21 NOTE — Telephone Encounter (Signed)
Received notification from [facility] regarding a prior authorization for Lidocaine Patches . Authorization has been APPROVED from 03/20/2019 to 01/23/2020.    Phone #[phone]

## 2019-03-21 NOTE — Telephone Encounter (Signed)
error 

## 2019-03-31 ENCOUNTER — Encounter: Payer: Self-pay | Admitting: Internal Medicine

## 2019-04-09 DIAGNOSIS — I10 Essential (primary) hypertension: Secondary | ICD-10-CM | POA: Diagnosis not present

## 2019-04-09 DIAGNOSIS — M545 Low back pain: Secondary | ICD-10-CM | POA: Diagnosis not present

## 2019-04-09 DIAGNOSIS — M456 Ankylosing spondylitis lumbar region: Secondary | ICD-10-CM | POA: Diagnosis not present

## 2019-04-09 DIAGNOSIS — M797 Fibromyalgia: Secondary | ICD-10-CM | POA: Diagnosis not present

## 2019-04-09 DIAGNOSIS — Z79899 Other long term (current) drug therapy: Secondary | ICD-10-CM | POA: Diagnosis not present

## 2019-04-09 DIAGNOSIS — Z6837 Body mass index (BMI) 37.0-37.9, adult: Secondary | ICD-10-CM | POA: Diagnosis not present

## 2019-04-09 DIAGNOSIS — G8929 Other chronic pain: Secondary | ICD-10-CM | POA: Diagnosis not present

## 2019-04-13 ENCOUNTER — Other Ambulatory Visit: Payer: Self-pay | Admitting: Internal Medicine

## 2019-04-13 NOTE — Telephone Encounter (Signed)
Please refill as per office routine med refill policy (all routine meds refilled for 3 mo or monthly per pt preference up to one year from last visit, then month to month grace period for 3 mo, then further med refills will have to be denied)  

## 2019-04-29 DIAGNOSIS — M1990 Unspecified osteoarthritis, unspecified site: Secondary | ICD-10-CM | POA: Diagnosis not present

## 2019-04-29 DIAGNOSIS — M797 Fibromyalgia: Secondary | ICD-10-CM | POA: Diagnosis not present

## 2019-04-29 DIAGNOSIS — T148XXA Other injury of unspecified body region, initial encounter: Secondary | ICD-10-CM | POA: Diagnosis not present

## 2019-04-29 DIAGNOSIS — M2550 Pain in unspecified joint: Secondary | ICD-10-CM | POA: Diagnosis not present

## 2019-04-29 DIAGNOSIS — R768 Other specified abnormal immunological findings in serum: Secondary | ICD-10-CM | POA: Diagnosis not present

## 2019-04-30 ENCOUNTER — Other Ambulatory Visit: Payer: Self-pay

## 2019-04-30 ENCOUNTER — Ambulatory Visit: Payer: Medicare PPO | Admitting: Internal Medicine

## 2019-04-30 ENCOUNTER — Encounter: Payer: Self-pay | Admitting: Internal Medicine

## 2019-04-30 DIAGNOSIS — R519 Headache, unspecified: Secondary | ICD-10-CM

## 2019-04-30 DIAGNOSIS — R1084 Generalized abdominal pain: Secondary | ICD-10-CM

## 2019-04-30 DIAGNOSIS — R109 Unspecified abdominal pain: Secondary | ICD-10-CM | POA: Insufficient documentation

## 2019-04-30 NOTE — Progress Notes (Addendum)
Subjective:    Patient ID: [name], female    DOB: Jun 04, 1950, 69 y.o.   MRN: [id number]  HPI Here with very long list of symptoms hard to explain, has seen second opinion rheumatology with dx of autoimmune disease (? Psoriatic arthritis vs ankylosing spondylitis vs other) and being considered for Humira; starting from head to toe she relates long months of fatigue, lack or energy, 2 wks now of daily chronic HA where topamax no longer working, and trial xyzal no help, though goody powder does.  Has now blurry vision, someone had to drive her here, and plans to see optho soon.  Also with occasional intermitent upper chest pressure with palpiations she believes is coming from the thyroid, also abd pain right > left, occaisonally severe, no better with IB guard and gaviscon this time, feels distended and distended more than when she was pregnant but losing wt overall, also with unexplained hot and cold sensations where has to have blanket as feet very cold, always has heat pad at night, also skin of post left calf area is getting "darker" for unclear reasons, also has vein hurting to the post right knee area but does not hurt to touch and no swelling, also feet with bilat pain to toes and new callouses she did not notice before to right great toe and heel Past Medical History:  Diagnosis Date  . Anxiety   . Arthritis   . Asthma   . Asthma   . Biliary dyskinesia   . Bowel obstruction ([place])   . BRCA negative 02/2013  . Cervical dysplasia   . Complication of anesthesia    "I have the shakes"  . Depression   . Diverticulosis   . Elevated cholesterol   . Endometriosis   . Fibromyalgia   . GERD (gastroesophageal reflux disease)   . Heart murmur    mvp  . Hx of oral aphthous ulcers   . Hypertension   . Hypertension    no med  . Hypothyroidism    hx nodule on thyroid   . Impaired glucose tolerance 02/25/2011  . Low back pain   . Migraine   . Migraines   . Nocturia   .  Polycythemia vera ([place])   . Shingles   . Shortness of breath    with exertion  . Sickle cell trait ([place])   . Sleep apnea    has not used c-pap x 1 yr. use mouthguard now  . Thyroid disease   . Thyroid disease    Hypothyroid   Past Surgical History:  Procedure Laterality Date  . ABDOMINAL HYSTERECTOMY    . BREAST EXCISIONAL BIOPSY Left 1999  . BREAST SURGERY     Cystectomy on Left breast  . BREAST SURGERY     Benign breast cyst  . CHOLECYSTECTOMY    . CHOLECYSTECTOMY  12/05/2011   Procedure: LAPAROSCOPIC CHOLECYSTECTOMY;  Surgeon: Ralene Ok, MD;  Location: [facility];  Service: General;  Laterality: N/A;  . COLPOSCOPY    . GYNECOLOGIC CRYOSURGERY    . HERNIA REPAIR     abdomen  . JOINT REPLACEMENT Right    hip  . OOPHORECTOMY     BSO  . PANNICULECTOMY N/A 06/12/2014   Procedure: PANNICULECTOMY;  Surgeon: Irene Limbo, MD;  Location: [facility];  Service: Plastics;  Laterality: N/A;  . PELVIC LAPAROSCOPY  1995,2002   DL LSO DL RSO  . TONSILLECTOMY    . TOTAL HIP ARTHROPLASTY  Right  . VAGINAL HYSTERECTOMY  1983    reports that she has never smoked. She has never used smokeless tobacco. She reports current alcohol use. She reports that she does not use drugs. family history includes Arthritis in her father; Bone cancer in her mother; Breast cancer in her maternal aunt, maternal aunt, maternal grandmother, mother, and paternal grandmother; Breast cancer (age of onset: 84) in her sister; Colon cancer in her sister; Colon cancer (age of onset: 65) in her sister; Diabetes in her father; Heart disease in her father and mother; Heart failure in her father; Hypertension in her father and mother; Multiple sclerosis in her cousin; Ovarian cancer in her paternal grandmother; Sarcoidosis in her brother, sister, and sister. Allergies  Allergen Reactions  . Celecoxib Nausea Only and Nausea And Vomiting  . Codeine Nausea Only    REACTION: Nausea  . Codeine Nausea  And Vomiting  . Duloxetine Nausea And Vomiting    REACTION: n/v REACTION: n/v  . Erythromycin     REACTION: Itching  . Escitalopram Oxalate Nausea Only  . Ezetimibe Nausea And Vomiting  . Gluten Meal     Intolerant to gluten  . Lactose Intolerance (Gi)   . Lyrica [Pregabalin] Nausea And Vomiting  . Milnacipran Nausea And Vomiting    REACTION: n/v REACTION: n/v  . Pregabalin Itching and Nausea And Vomiting    REACTION: wt gain REACTION: wt gain  . Rofecoxib Nausea And Vomiting  . Statins Nausea And Vomiting    Body aches  . Statins     Body aches  . Tramadol Nausea Only  . Ultram [Tramadol Hcl] Nausea And Vomiting  . Celebrex [Celecoxib] Swelling and Rash  . Contrast Media [Iodinated Diagnostic Agents] Hives    Patient has history of hives from Ionic contrast injection in 1980's. No allergy to Non-Ionic contrast. 05-09-2014 rsm   Current Outpatient Medications on File Prior to Visit  Medication Sig Dispense Refill  . albuterol (PROVENTIL HFA;VENTOLIN HFA) 108 (90 Base) MCG/ACT inhaler Inhale 2 puffs into the lungs every 6 (six) hours as needed for wheezing. For wheezing 3.7 g 11  . aspirin EC 81 MG EC tablet Take 1 tablet (81 mg total) by mouth daily. 30 tablet 0  . Calcium Carbonate-Vitamin D (CALCIUM + D PO) Take 2 tablets by mouth at bedtime.     . cyclobenzaprine (FLEXERIL) 5 MG tablet Take 5 mg by mouth as needed for muscle spasms.    . diclofenac sodium (VOLTAREN) 1 % GEL Apply 3 grams to 3 large joints up to 3 times daily. 3 Tube 3  . estradiol (VIVELLE-DOT) 0.05 MG/24HR patch Place 1 patch (0.05 mg total) onto the skin 2 (two) times a week. 8 patch 12  . fentaNYL (DURAGESIC - DOSED MCG/HR) 25 MCG/HR Place 1 patch onto the skin every 3 (three) days.    . fluticasone (FLONASE) 50 MCG/ACT nasal spray Place 2 sprays into both nostrils as needed. (Patient taking differently: Place 2 sprays into both nostrils as needed for allergies or rhinitis. ) 16 g 11  . hydrochlorothiazide  (HYDRODIURIL) 25 MG tablet Take 1 tablet (25 mg total) by mouth daily as needed (for edema). 90 tablet 3  . leflunomide (ARAVA) 20 MG tablet Take 20 mg by mouth daily.     Marland Kitchen lidocaine (LIDODERM) 5 % Place 1 patch onto the skin daily. Remove & Discard patch within 12 hours or as directed by MD 30 patch 1  . NONFORMULARY OR COMPOUNDED ITEM Boric Acid Capsules  600 mg. #30 Insert one cap intravaginally twice weekly. 30 each 2  . polyethylene glycol powder (MIRALAX) powder Take 17 g by mouth as needed. For constipation 255 g 0  . thyroid (NP THYROID) 30 MG tablet TAKE 1 TABLET(30 MG) BY MOUTH DAILY BEFORE BREAKFAST 90 tablet 2  . topiramate (TOPAMAX) 50 MG tablet TAKE 1 TABLET(50 MG) BY MOUTH TWICE DAILY 60 tablet 0  . amLODipine (NORVASC) 5 MG tablet Take 1 tablet (5 mg total) by mouth daily. 90 tablet 3   No current facility-administered medications on file prior to visit.   Review of Systems All otherwise neg per pt     Objective:   Physical Exam BP 128/82   Pulse 88   Temp 98 F (36.7 C)   Ht '5\' 4"'$  (1.626 m)   Wt 223 lb 12.8 oz (101.5 kg)   SpO2 98%   BMI 38.42 kg/m  VS noted,  Constitutional: Pt appears in NAD HENT: Head: NCAT.  Right Ear: External ear normal.  Left Ear: External ear normal.  Eyes: . Pupils are equal, round, and reactive to light. Conjunctivae and EOM are normal Nose: without d/c or deformity Neck: Neck supple. Gross normal ROM Cardiovascular: Normal rate and regular rhythm.   Pulmonary/Chest: Effort normal and breath sounds without rales or wheezing.  Abd:  Soft, NT, ND, + BS, no organomegaly Neurological: Pt is alert. At baseline orientation, motor grossly intact Skin: Skin is warm. No rashes, other new lesions, no LE edema, does have a callous to right medial great toe and heel Psychiatric: Pt behavior is normal without agitation  All otherwise neg per pt Lab Results  Component Value Date   WBC 10.2 10/30/2018   HGB 16.8 (H) 10/30/2018   HCT 51.0 (H)  10/30/2018   PLT 368.0 10/30/2018   GLUCOSE 111 (H) 10/30/2018   CHOL 262 (H) 10/30/2018   TRIG 123.0 10/30/2018   HDL 57.00 10/30/2018   LDLDIRECT 194.2 05/31/2007   LDLCALC 180 (H) 10/30/2018   ALT 13 10/30/2018   AST 18 10/30/2018   NA 141 10/30/2018   K 3.9 10/30/2018   CL 103 10/30/2018   CREATININE 1.27 (H) 10/30/2018   BUN 16 10/30/2018   CO2 27 10/30/2018   TSH 3.54 10/30/2018   HGBA1C 5.6 10/30/2018          Assessment & Plan:

## 2019-04-30 NOTE — Assessment & Plan Note (Signed)
Also refer to neurology per request, has possible transformed migraine I suspect

## 2019-04-30 NOTE — Assessment & Plan Note (Signed)
Etiology unclear, for labs, CT and consider GI referral  I spent 45 minutes in preparing to see the patient by review of recent labs, imaging and procedures, obtaining and reviewing separately obtained history, communicating with the patient and family or caregiver, ordering medications, tests or procedures, and documenting clinical information in the EHR including the differential Dx, treatment, and any further evaluation and other management of abd pain and other numerous symptoms with benign exam in the setting of second rheum opinion with undefined possible autoimmune disease

## 2019-04-30 NOTE — Patient Instructions (Signed)
Please continue all other medications as before, and refills have been done if requested.  Please have the pharmacy call with any other refills you may need.  Please continue your efforts at being more active, low cholesterol diet, and weight control..  Please keep your appointments with your specialists as you may have planned  You will be contacted regarding the referral for: neurology  Please make sure to see your eye doctor as you have planned  You will be contacted regarding the referral for: CT scan  Please go to the LAB at the blood drawing area for the tests to be done  You will be contacted by phone if any changes need to be made immediately.  Otherwise, you will receive a letter about your results with an explanation, but please check with MyChart first.  Please remember to sign up for MyChart if you have not done so, as this will be important to you in the future with finding out test results, communicating by private email, and scheduling acute appointments online when needed.

## 2019-05-01 ENCOUNTER — Other Ambulatory Visit: Payer: Self-pay | Admitting: Internal Medicine

## 2019-05-01 ENCOUNTER — Encounter: Payer: Self-pay | Admitting: Neurology

## 2019-05-01 LAB — URINALYSIS, ROUTINE W REFLEX MICROSCOPIC
Hgb urine dipstick: NEGATIVE
Leukocytes,Ua: NEGATIVE
Nitrite: POSITIVE — AB
Specific Gravity, Urine: 1.02 (ref 1.000–1.030)
Urine Glucose: NEGATIVE
Urobilinogen, UA: 0.2 (ref 0.0–1.0)
pH: 6 (ref 5.0–8.0)

## 2019-05-01 LAB — BASIC METABOLIC PANEL
BUN: 21 mg/dL (ref 6–23)
CO2: 30 mEq/L (ref 19–32)
Calcium: 9.3 mg/dL (ref 8.4–10.5)
Chloride: 103 mEq/L (ref 96–112)
Creatinine, Ser: 1.11 mg/dL (ref 0.40–1.20)
GFR: 59.42 mL/min — ABNORMAL LOW (ref >60.00)
Glucose, Bld: 87 mg/dL (ref 70–99)
Potassium: 3.7 mEq/L (ref 3.5–5.1)
Sodium: 142 mEq/L (ref 135–145)

## 2019-05-01 LAB — CBC WITH DIFFERENTIAL/PLATELET
Basophils Absolute: 0.1 10*3/uL (ref 0.0–0.1)
Basophils Relative: 1.3 % (ref 0.0–3.0)
Eosinophils Absolute: 0.1 10*3/uL (ref 0.0–0.7)
Eosinophils Relative: 0.6 % (ref 0.0–5.0)
HCT: 48.5 % — ABNORMAL HIGH (ref 36.0–46.0)
Hemoglobin: 15.7 g/dL — ABNORMAL HIGH (ref 12.0–15.0)
Lymphocytes Relative: 19.8 % (ref 12.0–46.0)
Lymphs Abs: 1.8 10*3/uL (ref 0.7–4.0)
MCHC: 32.5 g/dL (ref 30.0–36.0)
MCV: 87.6 fl (ref 78.0–100.0)
Monocytes Absolute: 0.7 10*3/uL (ref 0.1–1.0)
Monocytes Relative: 7.6 % (ref 3.0–12.0)
Neutro Abs: 6.6 10*3/uL (ref 1.4–7.7)
Neutrophils Relative %: 70.7 % (ref 43.0–77.0)
Platelets: 296 10*3/uL (ref 150.0–400.0)
RBC: 5.53 Mil/uL — ABNORMAL HIGH (ref 3.87–5.11)
RDW: 13.9 % (ref 11.5–15.5)
WBC: 9.3 10*3/uL (ref 4.0–10.5)

## 2019-05-01 LAB — HEPATIC FUNCTION PANEL
ALT: 13 U/L (ref 0–35)
AST: 21 U/L (ref 0–37)
Albumin: 3.8 g/dL (ref 3.5–5.2)
Alkaline Phosphatase: 69 U/L (ref 39–117)
Bilirubin, Direct: 0.1 mg/dL (ref 0.0–0.3)
Total Bilirubin: 0.5 mg/dL (ref 0.2–1.2)
Total Protein: 7 g/dL (ref 6.0–8.3)

## 2019-05-01 LAB — LIPASE: Lipase: 19 U/L (ref 11.0–59.0)

## 2019-05-01 MED ORDER — AMOXICILLIN 500 MG PO CAPS: 500.0000 mg | ORAL_CAPSULE | Freq: Three times a day (TID) | ORAL | 0 refills | Status: DC

## 2019-05-02 ENCOUNTER — Other Ambulatory Visit: Payer: Self-pay

## 2019-05-02 DIAGNOSIS — I10 Essential (primary) hypertension: Secondary | ICD-10-CM

## 2019-05-02 MED ORDER — AMLODIPINE BESYLATE 5 MG PO TABS: 5.0000 mg | ORAL_TABLET | Freq: Every day | ORAL | 3 refills | Status: DC

## 2019-05-12 ENCOUNTER — Ambulatory Visit
Admission: RE | Admit: 2019-05-12 | Discharge: 2019-05-12 | Disposition: A | Discharge status: Home / Self Care | Payer: Medicare PPO | Source: Ambulatory Visit | Attending: Internal Medicine | Admitting: Internal Medicine

## 2019-05-12 ENCOUNTER — Encounter: Payer: Self-pay | Admitting: Internal Medicine

## 2019-05-12 DIAGNOSIS — R109 Unspecified abdominal pain: Secondary | ICD-10-CM | POA: Diagnosis not present

## 2019-05-12 DIAGNOSIS — R1084 Generalized abdominal pain: Secondary | ICD-10-CM

## 2019-05-12 MED ORDER — IOPAMIDOL (ISOVUE-300) INJECTION 61%
100.0000 mL | Freq: Once | INTRAVENOUS | Status: AC | PRN
  Administered 2019-05-12: 100 mL via INTRAVENOUS

## 2019-05-13 ENCOUNTER — Inpatient Hospital Stay: Admission: RE | Admit: 2019-05-13 | Payer: Medicare PPO | Source: Ambulatory Visit

## 2019-05-14 DIAGNOSIS — I10 Essential (primary) hypertension: Secondary | ICD-10-CM | POA: Diagnosis not present

## 2019-05-14 DIAGNOSIS — M456 Ankylosing spondylitis lumbar region: Secondary | ICD-10-CM | POA: Diagnosis not present

## 2019-05-14 DIAGNOSIS — M545 Low back pain: Secondary | ICD-10-CM | POA: Diagnosis not present

## 2019-05-14 DIAGNOSIS — R109 Unspecified abdominal pain: Secondary | ICD-10-CM | POA: Diagnosis not present

## 2019-05-21 DIAGNOSIS — M797 Fibromyalgia: Secondary | ICD-10-CM | POA: Diagnosis not present

## 2019-05-21 DIAGNOSIS — J45909 Unspecified asthma, uncomplicated: Secondary | ICD-10-CM | POA: Diagnosis not present

## 2019-05-21 DIAGNOSIS — E079 Disorder of thyroid, unspecified: Secondary | ICD-10-CM | POA: Diagnosis not present

## 2019-05-21 DIAGNOSIS — E7800 Pure hypercholesterolemia, unspecified: Secondary | ICD-10-CM | POA: Diagnosis not present

## 2019-05-21 DIAGNOSIS — R519 Headache, unspecified: Secondary | ICD-10-CM | POA: Diagnosis not present

## 2019-05-21 DIAGNOSIS — R1011 Right upper quadrant pain: Secondary | ICD-10-CM | POA: Diagnosis not present

## 2019-05-21 DIAGNOSIS — R1031 Right lower quadrant pain: Secondary | ICD-10-CM | POA: Diagnosis not present

## 2019-05-21 DIAGNOSIS — R1032 Left lower quadrant pain: Secondary | ICD-10-CM | POA: Diagnosis not present

## 2019-05-21 DIAGNOSIS — D45 Polycythemia vera: Secondary | ICD-10-CM | POA: Diagnosis not present

## 2019-05-21 DIAGNOSIS — I10 Essential (primary) hypertension: Secondary | ICD-10-CM | POA: Diagnosis not present

## 2019-05-21 DIAGNOSIS — K5730 Diverticulosis of large intestine without perforation or abscess without bleeding: Secondary | ICD-10-CM | POA: Diagnosis not present

## 2019-05-23 ENCOUNTER — Encounter: Payer: Self-pay | Admitting: Internal Medicine

## 2019-06-13 DIAGNOSIS — G43709 Chronic migraine without aura, not intractable, without status migrainosus: Secondary | ICD-10-CM | POA: Diagnosis not present

## 2019-06-13 DIAGNOSIS — R110 Nausea: Secondary | ICD-10-CM | POA: Diagnosis not present

## 2019-06-13 DIAGNOSIS — M545 Low back pain: Secondary | ICD-10-CM | POA: Diagnosis not present

## 2019-06-13 DIAGNOSIS — G8929 Other chronic pain: Secondary | ICD-10-CM | POA: Diagnosis not present

## 2019-06-13 DIAGNOSIS — M546 Pain in thoracic spine: Secondary | ICD-10-CM | POA: Diagnosis not present

## 2019-07-03 ENCOUNTER — Ambulatory Visit: Payer: Medicare PPO | Admitting: Neurology

## 2019-07-14 DIAGNOSIS — K219 Gastro-esophageal reflux disease without esophagitis: Secondary | ICD-10-CM | POA: Diagnosis not present

## 2019-07-14 DIAGNOSIS — G8929 Other chronic pain: Secondary | ICD-10-CM | POA: Diagnosis not present

## 2019-07-14 DIAGNOSIS — M545 Low back pain: Secondary | ICD-10-CM | POA: Diagnosis not present

## 2019-07-14 DIAGNOSIS — G43709 Chronic migraine without aura, not intractable, without status migrainosus: Secondary | ICD-10-CM | POA: Diagnosis not present

## 2019-07-15 ENCOUNTER — Encounter: Payer: Medicare PPO | Admitting: Obstetrics and Gynecology

## 2019-08-19 ENCOUNTER — Encounter: Payer: Medicare PPO | Admitting: Obstetrics and Gynecology

## 2019-08-19 DIAGNOSIS — Z139 Encounter for screening, unspecified: Secondary | ICD-10-CM | POA: Diagnosis not present

## 2019-08-19 DIAGNOSIS — E785 Hyperlipidemia, unspecified: Secondary | ICD-10-CM | POA: Diagnosis not present

## 2019-08-19 DIAGNOSIS — E669 Obesity, unspecified: Secondary | ICD-10-CM | POA: Diagnosis not present

## 2019-08-19 DIAGNOSIS — Z1331 Encounter for screening for depression: Secondary | ICD-10-CM | POA: Diagnosis not present

## 2019-08-19 DIAGNOSIS — Z6837 Body mass index (BMI) 37.0-37.9, adult: Secondary | ICD-10-CM | POA: Diagnosis not present

## 2019-08-19 DIAGNOSIS — Z9181 History of falling: Secondary | ICD-10-CM | POA: Diagnosis not present

## 2019-08-19 DIAGNOSIS — Z0000 Encounter for general adult medical examination without abnormal findings: Secondary | ICD-10-CM | POA: Diagnosis not present

## 2019-09-18 DIAGNOSIS — Z79899 Other long term (current) drug therapy: Secondary | ICD-10-CM | POA: Diagnosis not present

## 2019-09-18 DIAGNOSIS — Z6838 Body mass index (BMI) 38.0-38.9, adult: Secondary | ICD-10-CM | POA: Diagnosis not present

## 2019-09-18 DIAGNOSIS — K219 Gastro-esophageal reflux disease without esophagitis: Secondary | ICD-10-CM | POA: Diagnosis not present

## 2019-09-18 DIAGNOSIS — G8929 Other chronic pain: Secondary | ICD-10-CM | POA: Diagnosis not present

## 2019-09-18 DIAGNOSIS — G629 Polyneuropathy, unspecified: Secondary | ICD-10-CM | POA: Diagnosis not present

## 2019-09-18 DIAGNOSIS — M456 Ankylosing spondylitis lumbar region: Secondary | ICD-10-CM | POA: Diagnosis not present

## 2019-09-18 DIAGNOSIS — M545 Low back pain: Secondary | ICD-10-CM | POA: Diagnosis not present

## 2019-09-18 DIAGNOSIS — G43709 Chronic migraine without aura, not intractable, without status migrainosus: Secondary | ICD-10-CM | POA: Diagnosis not present

## 2019-09-22 ENCOUNTER — Encounter: Payer: Self-pay | Admitting: Obstetrics and Gynecology

## 2019-09-22 ENCOUNTER — Other Ambulatory Visit: Payer: Self-pay

## 2019-09-22 ENCOUNTER — Ambulatory Visit (INDEPENDENT_AMBULATORY_CARE_PROVIDER_SITE_OTHER): Payer: Medicare PPO | Admitting: Obstetrics and Gynecology

## 2019-09-22 VITALS — BP 134/80 | Ht 63.5 in | Wt 228.0 lb

## 2019-09-22 DIAGNOSIS — N952 Postmenopausal atrophic vaginitis: Secondary | ICD-10-CM

## 2019-09-22 DIAGNOSIS — Z79899 Other long term (current) drug therapy: Secondary | ICD-10-CM | POA: Diagnosis not present

## 2019-09-22 DIAGNOSIS — N898 Other specified noninflammatory disorders of vagina: Secondary | ICD-10-CM | POA: Diagnosis not present

## 2019-09-22 DIAGNOSIS — R768 Other specified abnormal immunological findings in serum: Secondary | ICD-10-CM | POA: Diagnosis not present

## 2019-09-22 DIAGNOSIS — R232 Flushing: Secondary | ICD-10-CM

## 2019-09-22 DIAGNOSIS — M1990 Unspecified osteoarthritis, unspecified site: Secondary | ICD-10-CM | POA: Diagnosis not present

## 2019-09-22 DIAGNOSIS — E039 Hypothyroidism, unspecified: Secondary | ICD-10-CM | POA: Diagnosis not present

## 2019-09-22 DIAGNOSIS — Z9189 Other specified personal risk factors, not elsewhere classified: Secondary | ICD-10-CM

## 2019-09-22 DIAGNOSIS — Z01419 Encounter for gynecological examination (general) (routine) without abnormal findings: Secondary | ICD-10-CM

## 2019-09-22 DIAGNOSIS — M0609 Rheumatoid arthritis without rheumatoid factor, multiple sites: Secondary | ICD-10-CM | POA: Diagnosis not present

## 2019-09-22 DIAGNOSIS — M2550 Pain in unspecified joint: Secondary | ICD-10-CM | POA: Diagnosis not present

## 2019-09-22 DIAGNOSIS — M797 Fibromyalgia: Secondary | ICD-10-CM | POA: Diagnosis not present

## 2019-09-22 LAB — WET PREP FOR TRICH, YEAST, CLUE

## 2019-09-22 LAB — TSH: TSH: 2.72 mIU/L (ref 0.40–4.50)

## 2019-09-22 MED ORDER — ESTRADIOL 0.075 MG/24HR TD PTTW: 1.0000 | MEDICATED_PATCH | TRANSDERMAL | 3 refills | Status: DC

## 2019-09-22 NOTE — Progress Notes (Signed)
[name] [birth year] [id number]  SUBJECTIVE:  69 y.o. [id number] female here for a breast and pelvic exam. She has been noting worsening hot flashes, particularly at night.  She is using the estradiol patch 0.05 mg twice weekly.  No vaginal bleeding.  Noting more vaginal discharge.  Current Outpatient Medications  Medication Sig Dispense Refill  . albuterol (PROVENTIL HFA;VENTOLIN HFA) 108 (90 Base) MCG/ACT inhaler Inhale 2 puffs into the lungs every 6 (six) hours as needed for wheezing. For wheezing 3.7 g 11  . amLODipine (NORVASC) 5 MG tablet Take 1 tablet (5 mg total) by mouth daily. 90 tablet 3  . aspirin EC 81 MG EC tablet Take 1 tablet (81 mg total) by mouth daily. 30 tablet 0  . Calcium Carbonate-Vitamin D (CALCIUM + D PO) Take 2 tablets by mouth at bedtime.     . cyclobenzaprine (FLEXERIL) 5 MG tablet Take 5 mg by mouth as needed for muscle spasms.    . diclofenac sodium (VOLTAREN) 1 % GEL Apply 3 grams to 3 large joints up to 3 times daily. 3 Tube 3  . estradiol (VIVELLE-DOT) 0.05 MG/24HR patch Place 1 patch (0.05 mg total) onto the skin 2 (two) times a week. 8 patch 12  . fentaNYL (DURAGESIC - DOSED MCG/HR) 25 MCG/HR Place 1 patch onto the skin every 3 (three) days.    . fluticasone (FLONASE) 50 MCG/ACT nasal spray Place 2 sprays into both nostrils as needed. (Patient taking differently: Place 2 sprays into both nostrils as needed for allergies or rhinitis. ) 16 g 11  . hydrochlorothiazide (HYDRODIURIL) 25 MG tablet Take 1 tablet (25 mg total) by mouth daily as needed (for edema). 90 tablet 3  . leflunomide (ARAVA) 20 MG tablet Take 20 mg by mouth daily.     Marland Kitchen lidocaine (LIDODERM) 5 % Place 1 patch onto the skin daily. Remove & Discard patch within 12 hours or as directed by MD 30 patch 1  . NONFORMULARY OR COMPOUNDED ITEM Boric Acid Capsules 600 mg. #30 Insert one cap intravaginally twice weekly. 30 each 2  . polyethylene glycol powder (MIRALAX) powder Take 17 g by mouth  as needed. For constipation 255 g 0  . thyroid (NP THYROID) 30 MG tablet TAKE 1 TABLET(30 MG) BY MOUTH DAILY BEFORE BREAKFAST 90 tablet 2  . topiramate (TOPAMAX) 50 MG tablet TAKE 1 TABLET(50 MG) BY MOUTH TWICE DAILY (Patient not taking: Reported on 09/22/2019) 60 tablet 0   No current facility-administered medications for this visit.   Allergies: Duloxetine, Ezetimibe, Lyrica [pregabalin], Milnacipran, Rofecoxib, Ultram [tramadol hcl], Celebrex [celecoxib], Codeine, Erythromycin, Escitalopram oxalate, Gluten meal, Lactose intolerance (gi), and Statins  No LMP recorded. Patient has had a hysterectomy.  Past medical history,surgical history, problem list, medications, allergies, family history and social history were all reviewed and documented as reviewed in the EPIC chart.  GYN ROS: no abnormal bleeding, pelvic pain or discharge, no breast pain or new or enlarging lumps on self exam.  No dysuria, frequency, burning, pain with urination, cloudy/malodorous urine.   OBJECTIVE:  BP 134/80 (Cuff Size: Large)   Ht 5' 3.5" (1.613 m)   Wt 228 lb (103.4 kg)   BMI 39.75 kg/m  The patient appears well, alert, oriented, in no distress.  BREAST EXAM: breasts appear normal, no suspicious masses, no skin or nipple changes or axillary nodes  PELVIC EXAM: VULVA: normal appearing vulva with atrophic change, no masses, tenderness or lesions, VAGINA: normal appearing vagina with trophic change, normal color and  discharge, no lesions, CERVIX: surgically absent, UTERUS: surgically absent, vaginal cuff normal, ADNEXA: no masses, nontender WET MOUNT: Negative for pathogens  Chaperone: [name] present during the examination  ASSESSMENT:  69 y.o. [id number] here for a breast and pelvic exam  PLAN:   1. Postmenopausal/HRT. Prior hysterectomy and BSO.  Noting more hot flashes.  We discussed other potential causes such as medication or dose changes, uncontrolled blood sugar, thyroid dysregulation with history  of hypothyroidism.  I recommend checking a TSH today to rule that out as a cause.  Other than that we discussed the possibility of slight increase in her estradiol patch dose to the 0.075 mg twice weekly, and the associated increased risks with higher doses of estrogens, higher risk of thrombotic disorder to include heart attack, stroke, DVT, PE.  She accepts those risks and wants to try the higher dose.  Refill x1 year sent to her pharmacy. 2.  Occasional vaginitis symptoms.  She has used boric acid capsules twice weekly obtained through the compounding pharmacy.  She has a supply but will call when she needs more. 3.  Vaginal discharge.  Probably from atrophic vaginitis.  Negative vaginal wet mount.  If she feels that she is leaking urine then we can refer to urology for further evaluation. 4. Pap smear 04/2018.  No significant history of abnormal Pap smears.  Next Pap smear due 2023 following the current guidelines recommending the 3 year interval.  Will address at that time if she desires to continue screening. 5. Mammogram 11/2017.  Normal breast exam today.  She is reminded to schedule an annual mammogram as she knows she is overdue. 6. Colonoscopy 2013.  She will follow up at the interval recommended by her GI specialist.   7. DEXA 07/2018 normal.  Next DEXA recommended 2025 at 5-year interval. 8. Health maintenance.  Just checking TSH today as above, otherwise no routine screening labs today as she normally has these completed elsewhere.  Return annually or sooner, prn.  Joseph Pierini MD 09/22/19

## 2019-09-23 ENCOUNTER — Encounter: Payer: Medicare PPO | Admitting: Obstetrics and Gynecology

## 2019-10-06 ENCOUNTER — Telehealth: Payer: Self-pay

## 2019-10-06 NOTE — Telephone Encounter (Signed)
Spoke with patient and advised that Dr. Raliegh Ip prescribes Vit D 50,000 iu's for vitamin d deficiency. She could come by and have her level checked and if deficient he would prescribe until it becomes normal.   Upon discussion she just did not like taking the big Vitamin D tabs she had  daily. I told her that I take otc Vitamin D and they are tiny gel caps.  I  advised her to shop the bottles at the pharmacy and find her one that is small. She wants to do that and will let us know if she wants to come and have Vit D level checked.

## 2019-10-06 NOTE — Telephone Encounter (Signed)
Patient was in voice mail stating she would like to see if you would send Rx for Vitamin D 50,000 or something she could take just once a week.    Of note, her last Vitamin D was checked by her PCP on 10/30/2018 and was 38.55.

## 2019-10-06 NOTE — Telephone Encounter (Signed)
I would if her vitamin D level is low <30.  If she wants to check her vitamin D level that is fine, otherwise I would just continue with a daily supplement and recheck in 1 year

## 2019-10-22 ENCOUNTER — Other Ambulatory Visit: Payer: Self-pay | Admitting: Internal Medicine

## 2019-12-02 DIAGNOSIS — Z79899 Other long term (current) drug therapy: Secondary | ICD-10-CM | POA: Diagnosis not present

## 2019-12-02 DIAGNOSIS — M456 Ankylosing spondylitis lumbar region: Secondary | ICD-10-CM | POA: Diagnosis not present

## 2019-12-02 DIAGNOSIS — Z23 Encounter for immunization: Secondary | ICD-10-CM | POA: Diagnosis not present

## 2019-12-02 DIAGNOSIS — K219 Gastro-esophageal reflux disease without esophagitis: Secondary | ICD-10-CM | POA: Diagnosis not present

## 2019-12-02 DIAGNOSIS — G8929 Other chronic pain: Secondary | ICD-10-CM | POA: Diagnosis not present

## 2019-12-02 DIAGNOSIS — M545 Low back pain, unspecified: Secondary | ICD-10-CM | POA: Diagnosis not present

## 2019-12-02 DIAGNOSIS — Z6839 Body mass index (BMI) 39.0-39.9, adult: Secondary | ICD-10-CM | POA: Diagnosis not present

## 2019-12-12 DIAGNOSIS — R1032 Left lower quadrant pain: Secondary | ICD-10-CM | POA: Diagnosis not present

## 2019-12-12 DIAGNOSIS — R197 Diarrhea, unspecified: Secondary | ICD-10-CM | POA: Diagnosis not present

## 2019-12-28 ENCOUNTER — Other Ambulatory Visit: Payer: Self-pay | Admitting: Internal Medicine

## 2019-12-28 NOTE — Telephone Encounter (Signed)
Please refill as per office routine med refill policy (all routine meds refilled for 3 mo or monthly per pt preference up to one year from last visit, then month to month grace period for 3 mo, then further med refills will have to be denied)  

## 2019-12-29 DIAGNOSIS — M545 Low back pain, unspecified: Secondary | ICD-10-CM | POA: Diagnosis not present

## 2019-12-29 DIAGNOSIS — K219 Gastro-esophageal reflux disease without esophagitis: Secondary | ICD-10-CM | POA: Diagnosis not present

## 2019-12-29 DIAGNOSIS — Z6839 Body mass index (BMI) 39.0-39.9, adult: Secondary | ICD-10-CM | POA: Diagnosis not present

## 2019-12-29 DIAGNOSIS — J45909 Unspecified asthma, uncomplicated: Secondary | ICD-10-CM | POA: Diagnosis not present

## 2019-12-29 DIAGNOSIS — R059 Cough, unspecified: Secondary | ICD-10-CM | POA: Diagnosis not present

## 2020-01-15 DIAGNOSIS — K449 Diaphragmatic hernia without obstruction or gangrene: Secondary | ICD-10-CM | POA: Diagnosis not present

## 2020-01-15 DIAGNOSIS — D45 Polycythemia vera: Secondary | ICD-10-CM | POA: Diagnosis not present

## 2020-01-15 DIAGNOSIS — K648 Other hemorrhoids: Secondary | ICD-10-CM | POA: Diagnosis not present

## 2020-01-15 DIAGNOSIS — E785 Hyperlipidemia, unspecified: Secondary | ICD-10-CM | POA: Diagnosis not present

## 2020-01-15 DIAGNOSIS — D573 Sickle-cell trait: Secondary | ICD-10-CM | POA: Diagnosis not present

## 2020-01-15 DIAGNOSIS — E039 Hypothyroidism, unspecified: Secondary | ICD-10-CM | POA: Diagnosis not present

## 2020-01-15 DIAGNOSIS — K3189 Other diseases of stomach and duodenum: Secondary | ICD-10-CM | POA: Diagnosis not present

## 2020-01-15 DIAGNOSIS — K219 Gastro-esophageal reflux disease without esophagitis: Secondary | ICD-10-CM | POA: Diagnosis not present

## 2020-01-15 DIAGNOSIS — R197 Diarrhea, unspecified: Secondary | ICD-10-CM | POA: Diagnosis not present

## 2020-01-15 DIAGNOSIS — K2289 Other specified disease of esophagus: Secondary | ICD-10-CM | POA: Diagnosis not present

## 2020-01-15 DIAGNOSIS — K5730 Diverticulosis of large intestine without perforation or abscess without bleeding: Secondary | ICD-10-CM | POA: Diagnosis not present

## 2020-01-15 DIAGNOSIS — I10 Essential (primary) hypertension: Secondary | ICD-10-CM | POA: Diagnosis not present

## 2020-01-15 DIAGNOSIS — E7800 Pure hypercholesterolemia, unspecified: Secondary | ICD-10-CM | POA: Diagnosis not present

## 2020-01-15 DIAGNOSIS — K319 Disease of stomach and duodenum, unspecified: Secondary | ICD-10-CM | POA: Diagnosis not present

## 2020-01-15 DIAGNOSIS — K2970 Gastritis, unspecified, without bleeding: Secondary | ICD-10-CM | POA: Diagnosis not present

## 2020-01-30 ENCOUNTER — Telehealth: Payer: Self-pay

## 2020-01-30 NOTE — Telephone Encounter (Signed)
PA sent in awaiting on decision.   (Key: [id number])  Amphetamine-Dextroamphet ER 20MG  er capsules

## 2020-02-17 DIAGNOSIS — S96911A Strain of unspecified muscle and tendon at ankle and foot level, right foot, initial encounter: Secondary | ICD-10-CM | POA: Diagnosis not present

## 2020-02-17 DIAGNOSIS — M79671 Pain in right foot: Secondary | ICD-10-CM | POA: Diagnosis not present

## 2020-02-17 DIAGNOSIS — M25471 Effusion, right ankle: Secondary | ICD-10-CM | POA: Diagnosis not present

## 2020-02-26 ENCOUNTER — Other Ambulatory Visit: Payer: Self-pay | Admitting: Internal Medicine

## 2020-02-26 DIAGNOSIS — I10 Essential (primary) hypertension: Secondary | ICD-10-CM

## 2020-02-26 NOTE — Telephone Encounter (Signed)
Please refill as per office routine med refill policy (all routine meds refilled for 3 mo or monthly per pt preference up to one year from last visit, then month to month grace period for 3 mo, then further med refills will have to be denied)  

## 2020-03-02 DIAGNOSIS — M25471 Effusion, right ankle: Secondary | ICD-10-CM | POA: Diagnosis not present

## 2020-04-07 DIAGNOSIS — K760 Fatty (change of) liver, not elsewhere classified: Secondary | ICD-10-CM | POA: Diagnosis not present

## 2020-04-07 DIAGNOSIS — Z1231 Encounter for screening mammogram for malignant neoplasm of breast: Secondary | ICD-10-CM | POA: Diagnosis not present

## 2020-04-07 DIAGNOSIS — Z803 Family history of malignant neoplasm of breast: Secondary | ICD-10-CM | POA: Diagnosis not present

## 2020-04-07 DIAGNOSIS — E039 Hypothyroidism, unspecified: Secondary | ICD-10-CM | POA: Diagnosis not present

## 2020-04-07 DIAGNOSIS — Z79899 Other long term (current) drug therapy: Secondary | ICD-10-CM | POA: Diagnosis not present

## 2020-04-07 DIAGNOSIS — M797 Fibromyalgia: Secondary | ICD-10-CM | POA: Diagnosis not present

## 2020-04-07 DIAGNOSIS — R1011 Right upper quadrant pain: Secondary | ICD-10-CM | POA: Diagnosis not present

## 2020-04-07 DIAGNOSIS — J4520 Mild intermittent asthma, uncomplicated: Secondary | ICD-10-CM | POA: Diagnosis not present

## 2020-04-07 DIAGNOSIS — Z9049 Acquired absence of other specified parts of digestive tract: Secondary | ICD-10-CM | POA: Diagnosis not present

## 2020-04-07 DIAGNOSIS — I10 Essential (primary) hypertension: Secondary | ICD-10-CM | POA: Diagnosis not present

## 2020-04-07 DIAGNOSIS — Z136 Encounter for screening for cardiovascular disorders: Secondary | ICD-10-CM | POA: Diagnosis not present

## 2020-04-07 DIAGNOSIS — Z780 Asymptomatic menopausal state: Secondary | ICD-10-CM | POA: Diagnosis not present

## 2020-04-07 DIAGNOSIS — R0789 Other chest pain: Secondary | ICD-10-CM | POA: Diagnosis not present

## 2020-04-13 DIAGNOSIS — M76821 Posterior tibial tendinitis, right leg: Secondary | ICD-10-CM | POA: Diagnosis not present

## 2020-04-13 DIAGNOSIS — Q666 Other congenital valgus deformities of feet: Secondary | ICD-10-CM | POA: Diagnosis not present

## 2020-05-03 DIAGNOSIS — M79671 Pain in right foot: Secondary | ICD-10-CM | POA: Diagnosis not present

## 2020-05-03 DIAGNOSIS — M76829 Posterior tibial tendinitis, unspecified leg: Secondary | ICD-10-CM | POA: Diagnosis not present

## 2020-05-03 DIAGNOSIS — R262 Difficulty in walking, not elsewhere classified: Secondary | ICD-10-CM | POA: Diagnosis not present

## 2020-05-03 DIAGNOSIS — M6281 Muscle weakness (generalized): Secondary | ICD-10-CM | POA: Diagnosis not present

## 2020-05-03 DIAGNOSIS — M25671 Stiffness of right ankle, not elsewhere classified: Secondary | ICD-10-CM | POA: Diagnosis not present

## 2020-05-05 DIAGNOSIS — K219 Gastro-esophageal reflux disease without esophagitis: Secondary | ICD-10-CM | POA: Diagnosis not present

## 2020-05-05 DIAGNOSIS — M79672 Pain in left foot: Secondary | ICD-10-CM | POA: Diagnosis not present

## 2020-05-05 DIAGNOSIS — M79671 Pain in right foot: Secondary | ICD-10-CM | POA: Diagnosis not present

## 2020-05-05 DIAGNOSIS — M545 Low back pain, unspecified: Secondary | ICD-10-CM | POA: Diagnosis not present

## 2020-05-05 DIAGNOSIS — F1120 Opioid dependence, uncomplicated: Secondary | ICD-10-CM | POA: Diagnosis not present

## 2020-05-05 DIAGNOSIS — Z79899 Other long term (current) drug therapy: Secondary | ICD-10-CM | POA: Diagnosis not present

## 2020-05-05 DIAGNOSIS — G8929 Other chronic pain: Secondary | ICD-10-CM | POA: Diagnosis not present

## 2020-05-05 DIAGNOSIS — M797 Fibromyalgia: Secondary | ICD-10-CM | POA: Diagnosis not present

## 2020-05-05 DIAGNOSIS — Z6839 Body mass index (BMI) 39.0-39.9, adult: Secondary | ICD-10-CM | POA: Diagnosis not present

## 2020-06-04 DIAGNOSIS — D45 Polycythemia vera: Secondary | ICD-10-CM | POA: Diagnosis not present

## 2020-06-04 DIAGNOSIS — M456 Ankylosing spondylitis lumbar region: Secondary | ICD-10-CM | POA: Diagnosis not present

## 2020-06-04 DIAGNOSIS — K219 Gastro-esophageal reflux disease without esophagitis: Secondary | ICD-10-CM | POA: Diagnosis not present

## 2020-06-04 DIAGNOSIS — G8929 Other chronic pain: Secondary | ICD-10-CM | POA: Diagnosis not present

## 2020-06-04 DIAGNOSIS — Z6839 Body mass index (BMI) 39.0-39.9, adult: Secondary | ICD-10-CM | POA: Diagnosis not present

## 2020-06-04 DIAGNOSIS — M545 Low back pain, unspecified: Secondary | ICD-10-CM | POA: Diagnosis not present

## 2020-06-04 DIAGNOSIS — M797 Fibromyalgia: Secondary | ICD-10-CM | POA: Diagnosis not present

## 2020-07-08 DIAGNOSIS — M797 Fibromyalgia: Secondary | ICD-10-CM | POA: Diagnosis not present

## 2020-07-08 DIAGNOSIS — K219 Gastro-esophageal reflux disease without esophagitis: Secondary | ICD-10-CM | POA: Diagnosis not present

## 2020-07-08 DIAGNOSIS — M25569 Pain in unspecified knee: Secondary | ICD-10-CM | POA: Diagnosis not present

## 2020-07-08 DIAGNOSIS — M545 Low back pain, unspecified: Secondary | ICD-10-CM | POA: Diagnosis not present

## 2020-07-08 DIAGNOSIS — G8929 Other chronic pain: Secondary | ICD-10-CM | POA: Diagnosis not present

## 2020-07-08 DIAGNOSIS — M79673 Pain in unspecified foot: Secondary | ICD-10-CM | POA: Diagnosis not present

## 2020-08-11 ENCOUNTER — Other Ambulatory Visit: Payer: Self-pay

## 2020-08-11 MED ORDER — ESTRADIOL 0.075 MG/24HR TD PTTW: 1.0000 | MEDICATED_PATCH | TRANSDERMAL | 0 refills | Status: DC

## 2020-08-11 NOTE — Telephone Encounter (Signed)
Last 09/22/19 with Dr. Delilah Shan. Scheduled AEX 11/26/20.

## 2020-09-01 DIAGNOSIS — Z1331 Encounter for screening for depression: Secondary | ICD-10-CM | POA: Diagnosis not present

## 2020-09-01 DIAGNOSIS — Z0000 Encounter for general adult medical examination without abnormal findings: Secondary | ICD-10-CM | POA: Diagnosis not present

## 2020-09-01 DIAGNOSIS — Z9181 History of falling: Secondary | ICD-10-CM | POA: Diagnosis not present

## 2020-09-01 DIAGNOSIS — E669 Obesity, unspecified: Secondary | ICD-10-CM | POA: Diagnosis not present

## 2020-09-01 DIAGNOSIS — E785 Hyperlipidemia, unspecified: Secondary | ICD-10-CM | POA: Diagnosis not present

## 2020-09-01 DIAGNOSIS — Z139 Encounter for screening, unspecified: Secondary | ICD-10-CM | POA: Diagnosis not present

## 2020-09-06 DIAGNOSIS — R768 Other specified abnormal immunological findings in serum: Secondary | ICD-10-CM | POA: Diagnosis not present

## 2020-09-06 DIAGNOSIS — M1990 Unspecified osteoarthritis, unspecified site: Secondary | ICD-10-CM | POA: Diagnosis not present

## 2020-09-06 DIAGNOSIS — M797 Fibromyalgia: Secondary | ICD-10-CM | POA: Diagnosis not present

## 2020-09-06 DIAGNOSIS — M2550 Pain in unspecified joint: Secondary | ICD-10-CM | POA: Diagnosis not present

## 2020-09-07 ENCOUNTER — Encounter: Payer: Self-pay | Admitting: Internal Medicine

## 2020-09-07 ENCOUNTER — Other Ambulatory Visit: Payer: Self-pay

## 2020-09-07 ENCOUNTER — Ambulatory Visit: Payer: Medicare PPO | Admitting: Internal Medicine

## 2020-09-07 VITALS — BP 142/90 | HR 83 | Temp 98.3°F | Ht 63.5 in | Wt 236.4 lb

## 2020-09-07 DIAGNOSIS — R300 Dysuria: Secondary | ICD-10-CM

## 2020-09-07 DIAGNOSIS — R600 Localized edema: Secondary | ICD-10-CM | POA: Diagnosis not present

## 2020-09-07 DIAGNOSIS — E039 Hypothyroidism, unspecified: Secondary | ICD-10-CM | POA: Diagnosis not present

## 2020-09-07 DIAGNOSIS — I10 Essential (primary) hypertension: Secondary | ICD-10-CM | POA: Diagnosis not present

## 2020-09-07 DIAGNOSIS — R7303 Prediabetes: Secondary | ICD-10-CM | POA: Diagnosis not present

## 2020-09-07 MED ORDER — FUROSEMIDE 20 MG PO TABS: 20.0000 mg | ORAL_TABLET | Freq: Every day | ORAL | 3 refills | Status: DC

## 2020-09-07 NOTE — Assessment & Plan Note (Signed)
Acute on chronic Advised low sodium diet which she is not complaint with  Advised trying to dec oral diclofenac use which is also likely contributing Advised weight loss and increased exercise Discussed changing hctz to lasix 20 mg daily which she does want to try - stressed doing the above cmp Advised monitoring BP F/u with pcp

## 2020-09-07 NOTE — Assessment & Plan Note (Signed)
Acute Symptoms seem more chronic Has some associated lower abd pain Ck ua, ucx

## 2020-09-07 NOTE — Progress Notes (Signed)
Subjective:    Patient ID: [name], female    DOB: 1950/05/06, 70 y.o.   MRN: [id number]  HPI The patient is here for an acute visit.   ? Kidney infection - cramping in bladder when urinating x 3 weeks.  Urine is foul smelling.  She is drinkig cranberry juice and water.  She did have urine taken yesterday at her rheumatologists office and she does not know the results.    Has always had fluid issues - worse recently - past few days - now in knees and hips.  Taking hctz.    Cramping in lower abd and pain in b/l lower abd.  She is not necessarily new.    Frequent bm - normal formed stool but more frequent.  As soon as she eats she has a bm.  Does not matter what she eats.      Medications and allergies reviewed with patient and updated if appropriate.  Patient Active Problem List   Diagnosis Date Noted   Abdominal pain 04/30/2019   Headache 04/30/2019   Polyarthralgia 10/30/2018   Bilateral knee pain 10/30/2018   RLQ abdominal pain 07/18/2017   Hyperglycemia 07/18/2017   Right lumbar radiculopathy 05/17/2017   Abdominal pain, epigastric 05/17/2017   Hematoma 05/17/2017   Right leg pain 11/09/2016   Left ear hearing loss 11/09/2016   Right hip pain 03/17/2016   Right groin pain 08/19/2015   Diarrhea 07/22/2015   Right low back pain 07/22/2015   Left lower quadrant pain 08/21/2014   Bloating 08/21/2014   Nausea without vomiting 08/21/2014   Panniculitis 06/12/2014   Genetic testing 06/04/2014   Family history of breast cancer 06/04/2014   Peripheral edema 04/29/2014   Bilateral leg edema 04/15/2014   Mouth pain 03/13/2014   Pain of right great toe 03/13/2014   COPD exacerbation ([place]) 02/06/2014   Shingles outbreak 12/30/2013   Thrush 12/30/2013   Trigger point of thoracic region 12/30/2013   Migraine headache 11/13/2013   LUQ pain 05/08/2013   Polycythemia, secondary 09/22/2012   Chest pain 03/27/2012   Cervical dysplasia    Spell of  dizziness 10/14/2011   Bruising 10/14/2011   High risk medication use 10/14/2011   Lesion of mouth 05/30/2011   Chronic sinusitis 05/30/2011   Encounter for preventative adult health care exam with abnormal findings 02/25/2011   Impaired glucose tolerance 02/25/2011   Endometriosis    OTHER&UNSPECIFIED DISEASES THE ORAL SOFT TISSUES 03/30/2010   Family hx of colon cancer 03/11/2010   THYROID NODULE, RIGHT 03/01/2010   BACK PAIN 03/01/2010   GOITER 10/26/2008   CONSTIPATION 11/18/2007   LUMBAR RADICULOPATHY, LEFT 11/07/2007   Hypothyroidism 05/31/2007   ANXIETY 05/31/2007   ASTHMA 05/31/2007   COPD (chronic obstructive pulmonary disease) ([place]) 05/31/2007   COLONIC POLYPS, HX OF 05/31/2007   HLD (hyperlipidemia) 09/22/2006   Obesity 09/22/2006   SICKLE CELL TRAIT 09/22/2006   Depression 09/22/2006   OSA (obstructive sleep apnea) 09/22/2006   Essential hypertension 09/22/2006   VENOUS INSUFFICIENCY 09/22/2006   ALLERGIC RHINITIS 09/22/2006   GERD 09/22/2006   PEPTIC ULCER DISEASE 09/22/2006   Irritable bowel syndrome 09/22/2006   OSTEOARTHRITIS 09/22/2006   FIBROMYALGIA 09/22/2006   OSTEOPOROSIS 09/22/2006   FATIGUE, CHRONIC 09/22/2006   PERIPHERAL EDEMA 09/22/2006   MITRAL VALVE PROLAPSE, HX OF 09/22/2006    Current Outpatient Medications on File Prior to Visit  Medication Sig Dispense Refill   albuterol (PROVENTIL HFA;VENTOLIN HFA) 108 (90 Base) MCG/ACT inhaler Inhale 2  puffs into the lungs every 6 (six) hours as needed for wheezing. For wheezing 3.7 g 11   amLODipine (NORVASC) 5 MG tablet TAKE 1 TABLET(5 MG) BY MOUTH DAILY 90 tablet 3   aspirin EC 81 MG EC tablet Take 1 tablet (81 mg total) by mouth daily. 30 tablet 0   Calcium Carbonate-Vitamin D (CALCIUM + D PO) Take 2 tablets by mouth at bedtime.      cyclobenzaprine (FLEXERIL) 5 MG tablet Take 5 mg by mouth as needed for muscle spasms.     diclofenac sodium (VOLTAREN) 1 % GEL Apply 3 grams to 3 large joints up to 3  times daily. 3 Tube 3   estradiol (VIVELLE-DOT) 0.075 MG/24HR Place 1 patch onto the skin 2 (two) times a week. 24 patch 0   fentaNYL (DURAGESIC - DOSED MCG/HR) 25 MCG/HR Place 1 patch onto the skin every 3 (three) days.     fluticasone (FLONASE) 50 MCG/ACT nasal spray Place 2 sprays into both nostrils as needed. (Patient taking differently: Place 2 sprays into both nostrils as needed for allergies or rhinitis.) 16 g 11   hydrochlorothiazide (HYDRODIURIL) 25 MG tablet Take 1 tablet (25 mg total) by mouth daily as needed (for edema). 90 tablet 3   lidocaine (LIDODERM) 5 % Place 1 patch onto the skin daily. Remove & Discard patch within 12 hours or as directed by MD 30 patch 1   NONFORMULARY OR COMPOUNDED ITEM Boric Acid Capsules 600 mg. #30 Insert one cap intravaginally twice weekly. 30 each 2   polyethylene glycol powder (MIRALAX) powder Take 17 g by mouth as needed. For constipation 255 g 0   thyroid (NP THYROID) 30 MG tablet TAKE 1 TABLET(30 MG) BY MOUTH DAILY BEFORE BREAKFAST 90 tablet 2   topiramate (TOPAMAX) 50 MG tablet TAKE 1 TABLET(50 MG) BY MOUTH TWICE DAILY 60 tablet 0   No current facility-administered medications on file prior to visit.    Past Medical History:  Diagnosis Date   Ankylosis    Anxiety    Arthritis    Asthma    Asthma    Biliary dyskinesia    Bowel obstruction ([place])    BRCA negative 02/2013   Cervical dysplasia    Complication of anesthesia    "I have the shakes"   Depression    Diverticulosis    Elevated cholesterol    Endometriosis    Fibromyalgia    GERD (gastroesophageal reflux disease)    Heart murmur    mvp   Hx of oral aphthous ulcers    Hypertension    Hypertension    no med   Hypothyroidism    hx nodule on thyroid    Impaired glucose tolerance 02/25/2011   Low back pain    Migraine    Migraines    Nocturia    Polycythemia vera ([facility])    Shingles    Shortness of breath    with exertion   Sickle cell trait ([facility])    Sleep apnea    has  not used c-pap x 1 yr. use mouthguard now   Thyroid disease    Thyroid disease    Hypothyroid    Past Surgical History:  Procedure Laterality Date   ABDOMINAL HYSTERECTOMY     BREAST EXCISIONAL BIOPSY Left 1999   BREAST SURGERY     Cystectomy on Left breast   BREAST SURGERY     Benign breast cyst   CHOLECYSTECTOMY     CHOLECYSTECTOMY  12/05/2011  Procedure: LAPAROSCOPIC CHOLECYSTECTOMY;  Surgeon: Ralene Ok, MD;  Location: [facility];  Service: General;  Laterality: N/A;   COLPOSCOPY     GYNECOLOGIC CRYOSURGERY     HERNIA REPAIR     abdomen   JOINT REPLACEMENT Right    hip   OOPHORECTOMY     BSO   PANNICULECTOMY N/A 06/12/2014   Procedure: PANNICULECTOMY;  Surgeon: Irene Limbo, MD;  Location: [place];  Service: Plastics;  Laterality: N/A;   PELVIC LAPAROSCOPY  1995,2002   DL LSO DL RSO   TONSILLECTOMY     TOTAL HIP ARTHROPLASTY     Right   VAGINAL HYSTERECTOMY  1983    Social History   Socioeconomic History   Marital status: Single    Spouse name: Not on file   Number of children: 2   Years of education: 2.5 years of college   Highest education level: Not on file  Occupational History   Occupation: retired    Fish farm manager: RETIRED  Tobacco Use   Smoking status: Never   Smokeless tobacco: Never  Vaping Use   Vaping Use: Never used  Substance and Sexual Activity   Alcohol use: Yes    Alcohol/week: 0.0 standard drinks    Comment: socially   Drug use: Never   Sexual activity: Not Currently    Birth control/protection: Surgical    Comment: HYST-1st intercourse 17 yo-5 partners  Other Topics Concern   Not on file  Social History Narrative   Lives at home alone.   Right-handed.   6 cups per week.   Social Determinants of Health   Financial Resource Strain: Not on file  Food Insecurity: Not on file  Transportation Needs: Not on file  Physical Activity: Not on file  Stress: Not on file  Social Connections: Not on file    Family  History  Problem Relation Age of Onset   Hypertension Mother    Heart disease Mother    Breast cancer Mother        Age 70   Bone cancer Mother    Hypertension Father    Heart disease Father    Diabetes Father    Colon cancer Sister 31   Sarcoidosis Sister    Breast cancer Sister 80   Breast cancer Maternal Grandmother        Age 48   Ovarian cancer Paternal Grandmother    Breast cancer Paternal 41        Age unknown   Sarcoidosis Brother    Breast cancer Maternal Aunt        Age 46's   Breast cancer Maternal Aunt        Age 78   Heart failure Father    Arthritis Father    Colon cancer Sister    Sarcoidosis Sister    Multiple sclerosis Cousin     Review of Systems  Constitutional:  Positive for chills. Negative for fever.  Respiratory:  Negative for cough, shortness of breath and wheezing.   Cardiovascular:  Positive for chest pain (discomfort) and leg swelling. Negative for palpitations.  Gastrointestinal:  Positive for abdominal pain and nausea. Negative for constipation and diarrhea.  Genitourinary:  Positive for frequency. Negative for dysuria and hematuria.       Foul smelling urine.  Bladder spasms.  Some incontinence - take too long to get to bathroom  Musculoskeletal:  Positive for arthralgias and myalgias.  Neurological:  Positive for headaches.      Objective:   Vitals:  09/07/20 1602  BP: (!) 142/90  Pulse: 83  Temp: 98.3 F (36.8 C)  SpO2: 98%   BP Readings from Last 3 Encounters:  09/07/20 (!) 142/90  09/22/19 134/80  04/30/19 128/82   Wt Readings from Last 3 Encounters:  09/07/20 236 lb 6.4 oz (107.2 kg)  09/22/19 228 lb (103.4 kg)  04/30/19 223 lb 12.8 oz (101.5 kg)   Body mass index is 41.22 kg/m.   Physical Exam    Constitutional: Appears well-developed and well-nourished. No distress.  Head: Normocephalic and atraumatic.  Neck: Neck supple. No tracheal deviation present. No thyromegaly present.  No cervical  lymphadenopathy Cardiovascular: Normal rate, regular rhythm and normal heart sounds.   Mild b/l le edema Pulmonary/Chest: Effort normal and breath sounds normal. No respiratory distress. No has no wheezes. No rales.  Abdomen: obese, mild tenderness across lower abdomen w/o rebound or guarding Skin: Skin is warm and dry. Not diaphoretic.        Assessment & Plan:    See Problem List for Assessment and Plan of chronic medical problems.    This visit occurred during the SARS-CoV-2 public health emergency.  Safety protocols were in place, including screening questions prior to the visit, additional usage of staff PPE, and extensive cleaning of exam room while observing appropriate contact time as indicated for disinfecting solutions.

## 2020-09-07 NOTE — Assessment & Plan Note (Signed)
Chronic  Clinically euthyroid Currently taking thyroid NP 30 mg daily Check tsh  Titrate med dose if needed

## 2020-09-07 NOTE — Patient Instructions (Addendum)
  Blood work was ordered.  Come back Thursday to have this done.    Medications changes include :   stop the hydrochlorothiazide.  Start lasix 20 mg daily  Your prescription(s) have been submitted to your pharmacy. Please take as directed and contact our office if you believe you are having problem(s) with the medication(s).  Decrease salt intake in your diet.   Decrease diclofenac - this is contributing to your fluid.    Please followup in with Dr Jenny Reichmann

## 2020-09-07 NOTE — Assessment & Plan Note (Signed)
Chronic BP higher than usual Stressed low sodium diet Continue amlodipine 5 mg daily Wants to try lasix 20 mg - stop hctz Stressed monitoring BP and f/u with PCP

## 2020-09-07 NOTE — Assessment & Plan Note (Signed)
Chronic Check a1c Low sugar / carb diet Stressed regular exercise  

## 2020-09-10 ENCOUNTER — Telehealth: Payer: Self-pay | Admitting: Internal Medicine

## 2020-09-10 NOTE — Telephone Encounter (Signed)
Patient wants to know if she still needs to have labs done that were ordered by Dr. Quay Burow at previous visit  Other provider did labs & advised patient she had UTI & antibiotics were prescribed  Patient wants to be sure labs are necessary before getting them done (bc of cost)  Please follow up [phone]

## 2020-09-14 NOTE — Telephone Encounter (Signed)
Spoke with patient today.  She will contact us and let us know if they checked her kidney function or not.

## 2020-09-14 NOTE — Telephone Encounter (Signed)
I do not know who the other provider was and cannot see where blood work.  I recommended she have blood work done to recheck her kidney function since we started her on a water pill which can decrease her kidney function.  If the other provider did her kidney function then she does not need the blood work.

## 2020-10-27 ENCOUNTER — Other Ambulatory Visit (INDEPENDENT_AMBULATORY_CARE_PROVIDER_SITE_OTHER): Payer: Medicare PPO

## 2020-10-27 ENCOUNTER — Other Ambulatory Visit: Payer: Medicare PPO

## 2020-10-27 DIAGNOSIS — G8929 Other chronic pain: Secondary | ICD-10-CM | POA: Diagnosis not present

## 2020-10-27 DIAGNOSIS — M456 Ankylosing spondylitis lumbar region: Secondary | ICD-10-CM | POA: Diagnosis not present

## 2020-10-27 DIAGNOSIS — R300 Dysuria: Secondary | ICD-10-CM

## 2020-10-27 DIAGNOSIS — Z79899 Other long term (current) drug therapy: Secondary | ICD-10-CM | POA: Diagnosis not present

## 2020-10-27 DIAGNOSIS — M545 Low back pain, unspecified: Secondary | ICD-10-CM | POA: Diagnosis not present

## 2020-10-27 DIAGNOSIS — R600 Localized edema: Secondary | ICD-10-CM

## 2020-10-27 DIAGNOSIS — R7303 Prediabetes: Secondary | ICD-10-CM | POA: Diagnosis not present

## 2020-10-27 DIAGNOSIS — Z6839 Body mass index (BMI) 39.0-39.9, adult: Secondary | ICD-10-CM | POA: Diagnosis not present

## 2020-10-27 DIAGNOSIS — E782 Mixed hyperlipidemia: Secondary | ICD-10-CM | POA: Diagnosis not present

## 2020-10-27 DIAGNOSIS — E039 Hypothyroidism, unspecified: Secondary | ICD-10-CM | POA: Diagnosis not present

## 2020-10-27 DIAGNOSIS — I10 Essential (primary) hypertension: Secondary | ICD-10-CM | POA: Diagnosis not present

## 2020-10-27 LAB — URINALYSIS, ROUTINE W REFLEX MICROSCOPIC
Bilirubin Urine: NEGATIVE
Hgb urine dipstick: NEGATIVE
Ketones, ur: NEGATIVE
Leukocytes,Ua: NEGATIVE
Nitrite: NEGATIVE
RBC / HPF: NONE SEEN (ref 0–?)
Specific Gravity, Urine: 1.02 (ref 1.000–1.030)
Total Protein, Urine: NEGATIVE
Urine Glucose: NEGATIVE
Urobilinogen, UA: 0.2 (ref 0.0–1.0)
pH: 5.5 (ref 5.0–8.0)

## 2020-10-27 LAB — CBC WITH DIFFERENTIAL/PLATELET
Basophils Absolute: 0 10*3/uL (ref 0.0–0.1)
Basophils Relative: 0.5 % (ref 0.0–3.0)
Eosinophils Absolute: 0 10*3/uL (ref 0.0–0.7)
Eosinophils Relative: 0.5 % (ref 0.0–5.0)
HCT: 48.4 % — ABNORMAL HIGH (ref 36.0–46.0)
Hemoglobin: 15.8 g/dL — ABNORMAL HIGH (ref 12.0–15.0)
Lymphocytes Relative: 27.2 % (ref 12.0–46.0)
Lymphs Abs: 2.1 10*3/uL (ref 0.7–4.0)
MCHC: 32.6 g/dL (ref 30.0–36.0)
MCV: 88.1 fl (ref 78.0–100.0)
Monocytes Absolute: 0.6 10*3/uL (ref 0.1–1.0)
Monocytes Relative: 8.2 % (ref 3.0–12.0)
Neutro Abs: 4.8 10*3/uL (ref 1.4–7.7)
Neutrophils Relative %: 63.6 % (ref 43.0–77.0)
Platelets: 341 10*3/uL (ref 150.0–400.0)
RBC: 5.49 Mil/uL — ABNORMAL HIGH (ref 3.87–5.11)
RDW: 15 % (ref 11.5–15.5)
WBC: 7.6 10*3/uL (ref 4.0–10.5)

## 2020-10-27 LAB — COMPREHENSIVE METABOLIC PANEL
ALT: 12 U/L (ref 0–35)
AST: 17 U/L (ref 0–37)
Albumin: 4 g/dL (ref 3.5–5.2)
Alkaline Phosphatase: 66 U/L (ref 39–117)
BUN: 16 mg/dL (ref 6–23)
CO2: 30 mEq/L (ref 19–32)
Calcium: 9.6 mg/dL (ref 8.4–10.5)
Chloride: 101 mEq/L (ref 96–112)
Creatinine, Ser: 0.92 mg/dL (ref 0.40–1.20)
GFR: 64.23 mL/min (ref >60.00)
Glucose, Bld: 93 mg/dL (ref 70–99)
Potassium: 3.5 mEq/L (ref 3.5–5.1)
Sodium: 139 mEq/L (ref 135–145)
Total Bilirubin: 0.4 mg/dL (ref 0.2–1.2)
Total Protein: 7.6 g/dL (ref 6.0–8.3)

## 2020-10-27 LAB — HEMOGLOBIN A1C: Hgb A1c MFr Bld: 5.8 % (ref 4.6–6.5)

## 2020-10-27 LAB — TSH: TSH: 3.94 u[IU]/mL (ref 0.35–5.50)

## 2020-10-28 ENCOUNTER — Encounter: Payer: Self-pay | Admitting: Internal Medicine

## 2020-10-28 LAB — URINE CULTURE
MICRO NUMBER:: 12464462
SPECIMEN QUALITY:: ADEQUATE

## 2020-11-26 ENCOUNTER — Encounter: Payer: Self-pay | Admitting: Obstetrics & Gynecology

## 2020-11-26 ENCOUNTER — Other Ambulatory Visit: Payer: Self-pay

## 2020-11-26 ENCOUNTER — Ambulatory Visit (INDEPENDENT_AMBULATORY_CARE_PROVIDER_SITE_OTHER): Payer: Medicare PPO | Admitting: Obstetrics & Gynecology

## 2020-11-26 VITALS — BP 114/64 | HR 68 | Resp 16 | Ht 63.25 in | Wt 235.0 lb

## 2020-11-26 DIAGNOSIS — Z90722 Acquired absence of ovaries, bilateral: Secondary | ICD-10-CM

## 2020-11-26 DIAGNOSIS — Z01419 Encounter for gynecological examination (general) (routine) without abnormal findings: Secondary | ICD-10-CM | POA: Diagnosis not present

## 2020-11-26 DIAGNOSIS — Z780 Asymptomatic menopausal state: Secondary | ICD-10-CM

## 2020-11-26 DIAGNOSIS — R350 Frequency of micturition: Secondary | ICD-10-CM | POA: Diagnosis not present

## 2020-11-26 DIAGNOSIS — Z6841 Body Mass Index (BMI) 40.0 and over, adult: Secondary | ICD-10-CM | POA: Diagnosis not present

## 2020-11-26 DIAGNOSIS — Z9189 Other specified personal risk factors, not elsewhere classified: Secondary | ICD-10-CM

## 2020-11-26 DIAGNOSIS — Z9079 Acquired absence of other genital organ(s): Secondary | ICD-10-CM

## 2020-11-26 DIAGNOSIS — Z0000 Encounter for general adult medical examination without abnormal findings: Secondary | ICD-10-CM

## 2020-11-26 DIAGNOSIS — Z90710 Acquired absence of both cervix and uterus: Secondary | ICD-10-CM

## 2020-11-26 DIAGNOSIS — E66813 Obesity, class 3: Secondary | ICD-10-CM

## 2020-11-26 MED ORDER — ESTRADIOL 0.075 MG/24HR TD PTTW: 1.0000 | MEDICATED_PATCH | TRANSDERMAL | 4 refills | Status: DC

## 2020-11-26 NOTE — Progress Notes (Signed)
[name] [facility] 06-Nov-1950 [id number]   History:    70 y.o. G3P2A1L2  RP:  Established patient presenting for annual gyn exam   HPI: Prior Total hysterectomy and BSO.  Postmenopause, well on HRT with Estradiol patch 0.075 mg twice weekly. Occasional vaginitis symptoms.  She has used boric acid capsules twice weekly obtained through the compounding pharmacy.  Urinary incontinence with frequency currently.  Pap smear Neg in 04/2018.  Breasts normal.  Colonoscopy 2013. DEXA 07/2018 normal.  Next DEXA recommended 2025 at 5-year interval. BMI 41.3.  Health labs with Fam MD.  Past medical history,surgical history, family history and social history were all reviewed and documented in the EPIC chart.  Gynecologic History No LMP recorded. Patient has had a hysterectomy.  Obstetric History OB History  Gravida Para Term Preterm AB Living  3 2 2  0 1 2  SAB IAB Ectopic Multiple Live Births  0 0 0        # Outcome Date GA Lbr Len/2nd Weight Sex Delivery Anes PTL Lv  3 AB           2 Term           1 Term              ROS: A ROS was performed and pertinent positives and negatives are included in the history.  GENERAL: No fevers or chills. HEENT: No change in vision, no earache, sore throat or sinus congestion. NECK: No pain or stiffness. CARDIOVASCULAR: No chest pain or pressure. No palpitations. PULMONARY: No shortness of breath, cough or wheeze. GASTROINTESTINAL: No abdominal pain, nausea, vomiting or diarrhea, melena or bright red blood per rectum. GENITOURINARY: No urinary frequency, urgency, hesitancy or dysuria. MUSCULOSKELETAL: No joint or muscle pain, no back pain, no recent trauma. DERMATOLOGIC: No rash, no itching, no lesions. ENDOCRINE: No polyuria, polydipsia, no heat or cold intolerance. No recent change in weight. HEMATOLOGICAL: No anemia or easy bruising or bleeding. NEUROLOGIC: No headache, seizures, numbness, tingling or weakness. PSYCHIATRIC: No depression, no loss of  interest in normal activity or change in sleep pattern.     Exam:   BP 114/64   Pulse 68   Resp 16   Ht 5' 3.25" (1.607 m)   Wt 235 lb (106.6 kg)   BMI 41.30 kg/m   Body mass index is 41.3 kg/m.  General appearance : Well developed well nourished female. No acute distress HEENT: Eyes: no retinal hemorrhage or exudates,  Neck supple, trachea midline, no carotid bruits, no thyroidmegaly Lungs: Clear to auscultation, no rhonchi or wheezes, or rib retractions  Heart: Regular rate and rhythm, no murmurs or gallops Breast:Examined in sitting and supine position were symmetrical in appearance, no palpable masses or tenderness,  no skin retraction, no nipple inversion, no nipple discharge, no skin discoloration, no axillary or supraclavicular lymphadenopathy Abdomen: no palpable masses or tenderness, no rebound or guarding Extremities: no edema or skin discoloration or tenderness  Pelvic: Vulva: Normal.  Very small 2 mm, round, nodule at derm Rt ant vulva.             Vagina: No gross lesions or discharge  Cervix/Uterus absent  Adnexa  Without masses or tenderness  Anus: Normal  U/A: Yellow cloudy, Pro Neg, Nit Neg, WBC 0-5, RBC 0-2, Bacteria Moderate.  U. Culture pending.   Assessment/Plan:  70 y.o. female for annual exam   1. Well female exam with routine gynecological exam 2. Wellness gyn exam with risk of urinary tract  infection Prior Total hysterectomy and BSO.  Postmenopause, well on HRT with Estradiol patch 0.075 mg twice weekly. Occasional vaginitis symptoms.  She has used boric acid capsules twice weekly obtained through the compounding pharmacy.  Urinary incontinence with frequency currently.  Pap smear Neg in 04/2018. No indication to repeat this year.  Breasts normal.  Mammo 03/2020 Neg. Colonoscopy 2013. DEXA 07/2018 normal.  Next DEXA recommended 2025 at 5-year interval. BMI 41.3.  Health labs with Fam MD.  3. S/P total hysterectomy and BSO (bilateral  salpingo-oophorectomy)  4. Postmenopause Well on HRT with Estradiol patch 0.075 twice a week.  No CI to continue.  Prescription sent to pharmacy.  BD normal in 07/2018.  5. Urinary frequency Mildly perturbed U/A, will wait on culture to decide on treatment.  6. Class 3 severe obesity due to excess calories with serious comorbidity and body mass index (BMI) of 40.0 to 44.9 in adult [facility]) Recommend a lower calorie/carb diet.  Aerobic activities 5 times a week and light weightlifting every 2 days.  7. Laboratory exam ordered as part of routine general medical examination - Urinalysis,Complete w/RFL Culture  8. Other specified personal risk factors, not elsewhere classified  Other orders - Adalimumab (HUMIRA) 40 MG/0.4ML PSKT; Inject into the skin every 14 (fourteen) days. - estradiol (VIVELLE-DOT) 0.075 MG/24HR; Place 1 patch onto the skin 2 (two) times a week.   Princess Bruins MD, 3:29 PM 11/26/2020

## 2020-11-28 LAB — URINE CULTURE
MICRO NUMBER:: 12595742
SPECIMEN QUALITY:: ADEQUATE

## 2020-11-28 LAB — URINALYSIS, COMPLETE W/RFL CULTURE
Bilirubin Urine: NEGATIVE
Glucose, UA: NEGATIVE
Hyaline Cast: NONE SEEN /LPF
Leukocyte Esterase: NEGATIVE
Nitrites, Initial: NEGATIVE
Protein, ur: NEGATIVE
Specific Gravity, Urine: 1.02 (ref 1.001–1.035)
pH: 5.5 (ref 5.0–8.0)

## 2020-11-28 LAB — CULTURE INDICATED

## 2020-12-05 ENCOUNTER — Encounter: Payer: Self-pay | Admitting: Obstetrics & Gynecology

## 2020-12-07 DIAGNOSIS — I10 Essential (primary) hypertension: Secondary | ICD-10-CM | POA: Diagnosis not present

## 2020-12-07 DIAGNOSIS — M456 Ankylosing spondylitis lumbar region: Secondary | ICD-10-CM | POA: Diagnosis not present

## 2020-12-07 DIAGNOSIS — G8929 Other chronic pain: Secondary | ICD-10-CM | POA: Diagnosis not present

## 2020-12-07 DIAGNOSIS — M545 Low back pain, unspecified: Secondary | ICD-10-CM | POA: Diagnosis not present

## 2021-01-27 DIAGNOSIS — Z79899 Other long term (current) drug therapy: Secondary | ICD-10-CM | POA: Diagnosis not present

## 2021-01-27 DIAGNOSIS — G8929 Other chronic pain: Secondary | ICD-10-CM | POA: Diagnosis not present

## 2021-01-27 DIAGNOSIS — R110 Nausea: Secondary | ICD-10-CM | POA: Diagnosis not present

## 2021-01-27 DIAGNOSIS — Z23 Encounter for immunization: Secondary | ICD-10-CM | POA: Diagnosis not present

## 2021-01-27 DIAGNOSIS — M456 Ankylosing spondylitis lumbar region: Secondary | ICD-10-CM | POA: Diagnosis not present

## 2021-01-27 DIAGNOSIS — M545 Low back pain, unspecified: Secondary | ICD-10-CM | POA: Diagnosis not present

## 2021-02-03 DIAGNOSIS — M2550 Pain in unspecified joint: Secondary | ICD-10-CM | POA: Diagnosis not present

## 2021-02-03 DIAGNOSIS — Z79899 Other long term (current) drug therapy: Secondary | ICD-10-CM | POA: Diagnosis not present

## 2021-02-03 DIAGNOSIS — R768 Other specified abnormal immunological findings in serum: Secondary | ICD-10-CM | POA: Diagnosis not present

## 2021-02-03 DIAGNOSIS — M0609 Rheumatoid arthritis without rheumatoid factor, multiple sites: Secondary | ICD-10-CM | POA: Diagnosis not present

## 2021-02-23 ENCOUNTER — Other Ambulatory Visit: Payer: Self-pay

## 2021-02-23 ENCOUNTER — Encounter: Payer: Self-pay | Admitting: Internal Medicine

## 2021-02-23 ENCOUNTER — Ambulatory Visit: Payer: Medicare PPO | Admitting: Internal Medicine

## 2021-02-23 VITALS — BP 136/90 | HR 85 | Temp 98.7°F | Ht 63.25 in | Wt 238.6 lb

## 2021-02-23 DIAGNOSIS — I10 Essential (primary) hypertension: Secondary | ICD-10-CM

## 2021-02-23 DIAGNOSIS — E785 Hyperlipidemia, unspecified: Secondary | ICD-10-CM | POA: Diagnosis not present

## 2021-02-23 DIAGNOSIS — E538 Deficiency of other specified B group vitamins: Secondary | ICD-10-CM | POA: Diagnosis not present

## 2021-02-23 DIAGNOSIS — F411 Generalized anxiety disorder: Secondary | ICD-10-CM | POA: Diagnosis not present

## 2021-02-23 DIAGNOSIS — Z0001 Encounter for general adult medical examination with abnormal findings: Secondary | ICD-10-CM

## 2021-02-23 DIAGNOSIS — R1084 Generalized abdominal pain: Secondary | ICD-10-CM | POA: Diagnosis not present

## 2021-02-23 DIAGNOSIS — E039 Hypothyroidism, unspecified: Secondary | ICD-10-CM

## 2021-02-23 DIAGNOSIS — R7303 Prediabetes: Secondary | ICD-10-CM

## 2021-02-23 DIAGNOSIS — R7989 Other specified abnormal findings of blood chemistry: Secondary | ICD-10-CM

## 2021-02-23 DIAGNOSIS — D751 Secondary polycythemia: Secondary | ICD-10-CM

## 2021-02-23 DIAGNOSIS — J438 Other emphysema: Secondary | ICD-10-CM

## 2021-02-23 DIAGNOSIS — M459 Ankylosing spondylitis of unspecified sites in spine: Secondary | ICD-10-CM | POA: Insufficient documentation

## 2021-02-23 DIAGNOSIS — K219 Gastro-esophageal reflux disease without esophagitis: Secondary | ICD-10-CM

## 2021-02-23 LAB — BASIC METABOLIC PANEL
BUN: 14 mg/dL (ref 6–23)
CO2: 34 mEq/L — ABNORMAL HIGH (ref 19–32)
Calcium: 9.4 mg/dL (ref 8.4–10.5)
Chloride: 105 mEq/L (ref 96–112)
Creatinine, Ser: 0.93 mg/dL (ref 0.40–1.20)
GFR: 63.25 mL/min (ref >60.00)
Glucose, Bld: 100 mg/dL — ABNORMAL HIGH (ref 70–99)
Potassium: 4.1 mEq/L (ref 3.5–5.1)
Sodium: 141 mEq/L (ref 135–145)

## 2021-02-23 LAB — CBC WITH DIFFERENTIAL/PLATELET
Basophils Absolute: 0.1 10*3/uL (ref 0.0–0.1)
Basophils Relative: 0.7 % (ref 0.0–3.0)
Eosinophils Absolute: 0.1 10*3/uL (ref 0.0–0.7)
Eosinophils Relative: 0.8 % (ref 0.0–5.0)
HCT: 48.8 % — ABNORMAL HIGH (ref 36.0–46.0)
Hemoglobin: 15.8 g/dL — ABNORMAL HIGH (ref 12.0–15.0)
Lymphocytes Relative: 26 % (ref 12.0–46.0)
Lymphs Abs: 2.6 10*3/uL (ref 0.7–4.0)
MCHC: 32.5 g/dL (ref 30.0–36.0)
MCV: 88.2 fl (ref 78.0–100.0)
Monocytes Absolute: 0.5 10*3/uL (ref 0.1–1.0)
Monocytes Relative: 4.6 % (ref 3.0–12.0)
Neutro Abs: 6.8 10*3/uL (ref 1.4–7.7)
Neutrophils Relative %: 67.9 % (ref 43.0–77.0)
Platelets: 348 10*3/uL (ref 150.0–400.0)
RBC: 5.53 Mil/uL — ABNORMAL HIGH (ref 3.87–5.11)
RDW: 14.8 % (ref 11.5–15.5)
WBC: 10 10*3/uL (ref 4.0–10.5)

## 2021-02-23 LAB — VITAMIN D 25 HYDROXY (VIT D DEFICIENCY, FRACTURES): VITD: 45.65 ng/mL (ref 30.00–100.00)

## 2021-02-23 LAB — LIPID PANEL
Cholesterol: 234 mg/dL — ABNORMAL HIGH (ref 0–200)
HDL: 48.8 mg/dL (ref >39.00)
LDL Cholesterol: 158 mg/dL — ABNORMAL HIGH (ref 0–99)
NonHDL: 185.65
Total CHOL/HDL Ratio: 5
Triglycerides: 137 mg/dL (ref 0.0–149.0)
VLDL: 27.4 mg/dL (ref 0.0–40.0)

## 2021-02-23 LAB — HEPATIC FUNCTION PANEL
ALT: 12 U/L (ref 0–35)
AST: 18 U/L (ref 0–37)
Albumin: 4 g/dL (ref 3.5–5.2)
Alkaline Phosphatase: 65 U/L (ref 39–117)
Bilirubin, Direct: 0 mg/dL (ref 0.0–0.3)
Total Bilirubin: 0.4 mg/dL (ref 0.2–1.2)
Total Protein: 7.8 g/dL (ref 6.0–8.3)

## 2021-02-23 LAB — VITAMIN B12: Vitamin B-12: 295 pg/mL (ref 211–911)

## 2021-02-23 LAB — TSH: TSH: 2.43 u[IU]/mL (ref 0.35–5.50)

## 2021-02-23 LAB — T4, FREE: Free T4: 0.77 ng/dL (ref 0.60–1.60)

## 2021-02-23 MED ORDER — FUROSEMIDE 20 MG PO TABS: 20.0000 mg | ORAL_TABLET | Freq: Every day | ORAL | 3 refills | Status: AC

## 2021-02-23 MED ORDER — AMLODIPINE BESYLATE 5 MG PO TABS: ORAL_TABLET | ORAL | 3 refills | Status: AC

## 2021-02-23 MED ORDER — AMLODIPINE BESYLATE 5 MG PO TABS: ORAL_TABLET | ORAL | 3 refills | Status: DC

## 2021-02-23 MED ORDER — LANSOPRAZOLE 30 MG PO CPDR: 30.0000 mg | DELAYED_RELEASE_CAPSULE | Freq: Every day | ORAL | 3 refills | Status: AC

## 2021-02-23 NOTE — Patient Instructions (Signed)
Please continue all other medications as before, and refills have been done if requested - the prevacid  Please have the pharmacy call with any other refills you may need.  Please continue your efforts at being more active, low cholesterol diet, and weight control.  You are otherwise up to date with prevention measures today.  Please keep your appointments with your specialists as you may have planned  You will be contacted regarding the referral for: Hematology/oncology, and GI  Please go to the LAB at the blood drawing area for the tests to be done  You will be contacted by phone if any changes need to be made immediately.  Otherwise, you will receive a letter about your results with an explanation, but please check with MyChart first.  Please remember to sign up for MyChart if you have not done so, as this will be important to you in the future with finding out test results, communicating by private email, and scheduling acute appointments online when needed.  Please make an Appointment to return in 6 months, or sooner if needed

## 2021-02-23 NOTE — Progress Notes (Signed)
Patient ID: [name], female   DOB: [birth year], 71 y.o.   MRN: [id number]         Chief Complaint:: wellness exam and Asthma (Asthma attacks, Inflamation and fatigue ), Chest Pain, and GI Problem (Wakes up with cramps every morning and throughout the day, frequent loose bowel movements (around 5-6 times a day) or constipation. )  , hyperglycemia, gerd       HPI:  [name] is a 71 y.o. female here for wellness exam; pt plans to call for GYN pap and mammogram, declines covid booster, shingrix, pneumovax, tdap                 Also with hx of p vera, not followed per heme recently.  Pt denies orthopnea, PND, increased LE swelling, palpitations, dizziness or syncope, with worsening mild wheezing sob doe and fleeting chest pains in the past 2 wks    Pt denies polydipsia, polyuria, or new focal neuro s/s.  Has ongoing GI symptoms with alternating constipation/diarrhea more symptomatic in the past, plans to f/u with GI soon. S/p EGD and colonoscopy at [facility] Jan 14 2021.   Denies worsening reflux, dysphagia, or blood.  Pt denies fever, wt loss, night sweats, loss of appetite, or other constitutional symptoms but has worsening fatigue as well.  Seeing rheum for autoimmune disease.  Denies worsening depressive symptoms, suicidal ideation, or panic; has ongoing anxiety   Denies hyper or hypo thyroid symptoms such as voice, skin or hair change. Wt Readings from Last 3 Encounters:  02/23/21 238 lb 9.6 oz (108.2 kg)  11/26/20 235 lb (106.6 kg)  09/07/20 236 lb 6.4 oz (107.2 kg)   BP Readings from Last 3 Encounters:  02/23/21 136/90  11/26/20 114/64  09/07/20 (!) 142/90   Immunization History  Administered Date(s) Administered   Fluad Quad(high Dose 65+) 10/30/2018   Influenza Split 10/27/2011   Influenza Whole 10/26/2008   Influenza-Unspecified 09/23/2012, 10/23/2017, 12/07/2020   PFIZER(Purple Top)SARS-COV-2 Vaccination 03/14/2019   Pneumococcal Polysaccharide-23 01/24/2004,  10/30/2018   There are no preventive care reminders to display for this patient.     Past Medical History:  Diagnosis Date   Ankylosis    Anxiety    Arthritis    Asthma    Asthma    Biliary dyskinesia    Bowel obstruction (HCC)    BRCA negative 02/2013   Cervical dysplasia    Complication of anesthesia    "I have the shakes"   Depression    Diverticulosis    Elevated cholesterol    Endometriosis    Fibromyalgia    GERD (gastroesophageal reflux disease)    Heart murmur    mvp   Hx of oral aphthous ulcers    Hypertension    Hypertension    no med   Hypothyroidism    hx nodule on thyroid    Impaired glucose tolerance 02/25/2011   Low back pain    Migraine    Migraines    Nocturia    Polycythemia vera (HCC)    Shingles    Shortness of breath    with exertion   Sickle cell trait (HCC)    Sleep apnea    has not used c-pap x 1 yr. use mouthguard now   Thyroid disease    Thyroid disease    Hypothyroid   Past Surgical History:  Procedure Laterality Date   ABDOMINAL HYSTERECTOMY     BREAST EXCISIONAL BIOPSY Left 1999   BREAST SURGERY  Cystectomy on Left breast   BREAST SURGERY     Benign breast cyst   CHOLECYSTECTOMY     CHOLECYSTECTOMY  12/05/2011   Procedure: LAPAROSCOPIC CHOLECYSTECTOMY;  Surgeon: Axel Filler, MD;  Location: [facility];  Service: General;  Laterality: N/A;   COLPOSCOPY     GYNECOLOGIC CRYOSURGERY     HERNIA REPAIR     abdomen   JOINT REPLACEMENT Right    hip   OOPHORECTOMY     BSO   PANNICULECTOMY N/A 06/12/2014   Procedure: PANNICULECTOMY;  Surgeon: Glenna Fellows, MD;  Location: [facility];  Service: Plastics;  Laterality: N/A;   PELVIC LAPAROSCOPY  1995,2002   DL LSO DL RSO   TONSILLECTOMY     TOTAL HIP ARTHROPLASTY     Right   VAGINAL HYSTERECTOMY  1983    reports that she has never smoked. She has never used smokeless tobacco. She reports that she does not currently use alcohol. She reports that she does not  use drugs. family history includes Arthritis in her father; Bone cancer in her mother; Breast cancer in her maternal aunt, maternal aunt, maternal grandmother, mother, and paternal grandmother; Breast cancer (age of onset: 51) in her sister; Colon cancer in her sister; Colon cancer (age of onset: 56) in her sister; Diabetes in her father; Heart disease in her father and mother; Heart failure in her father; Hypertension in her father and mother; Multiple sclerosis in her cousin; Ovarian cancer in her paternal grandmother; Sarcoidosis in her brother, sister, and sister. Allergies  Allergen Reactions   Duloxetine Nausea And Vomiting   Ezetimibe Nausea And Vomiting   Lyrica [Pregabalin] Nausea And Vomiting   Milnacipran Nausea And Vomiting   Rofecoxib Nausea And Vomiting   Ultram [Tramadol Hcl] Nausea And Vomiting   Celebrex [Celecoxib] Swelling and Rash   Codeine Nausea Only   Erythromycin Itching   Escitalopram Oxalate Nausea Only   Gluten Meal Other (See Comments)    Intolerant to gluten   Lactose Intolerance (Gi) Other (See Comments)    GI upset   Statins Other (See Comments)    Body aches   Current Outpatient Medications on File Prior to Visit  Medication Sig Dispense Refill   Adalimumab (HUMIRA) 40 MG/0.4ML PSKT Inject into the skin every 14 (fourteen) days.     albuterol (PROVENTIL HFA;VENTOLIN HFA) 108 (90 Base) MCG/ACT inhaler Inhale 2 puffs into the lungs every 6 (six) hours as needed for wheezing. For wheezing 3.7 g 11   aspirin EC 81 MG EC tablet Take 1 tablet (81 mg total) by mouth daily. 30 tablet 0   Calcium Carbonate-Vitamin D (CALCIUM + D PO) Take 2 tablets by mouth at bedtime.      cyclobenzaprine (FLEXERIL) 5 MG tablet Take 5 mg by mouth as needed for muscle spasms.     diclofenac sodium (VOLTAREN) 1 % GEL Apply 3 grams to 3 large joints up to 3 times daily. 3 Tube 3   estradiol (VIVELLE-DOT) 0.075 MG/24HR Place 1 patch onto the skin 2 (two) times a week. 24 patch 4    fentaNYL (DURAGESIC - DOSED MCG/HR) 25 MCG/HR Place 1 patch onto the skin every 3 (three) days.     fluticasone (FLONASE) 50 MCG/ACT nasal spray Place 2 sprays into both nostrils as needed. (Patient taking differently: Place 2 sprays into both nostrils as needed for allergies or rhinitis.) 16 g 11   folic acid (FOLVITE) 1 MG tablet Take 1 mg by mouth daily.  lidocaine (LIDODERM) 5 % Place 1 patch onto the skin daily. Remove & Discard patch within 12 hours or as directed by MD 30 patch 1   methotrexate (RHEUMATREX) 2.5 MG tablet Take 10 mg by mouth once a week. Take 4 2.5 mg tablets by mouth weekly. Caution:Chemotherapy. Protect from light.     NONFORMULARY OR COMPOUNDED ITEM Boric Acid Capsules 600 mg. #30 Insert one cap intravaginally twice weekly. 30 each 2   polyethylene glycol powder (MIRALAX) powder Take 17 g by mouth as needed. For constipation 255 g 0   thyroid (NP THYROID) 30 MG tablet TAKE 1 TABLET(30 MG) BY MOUTH DAILY BEFORE BREAKFAST 90 tablet 2   topiramate (TOPAMAX) 50 MG tablet TAKE 1 TABLET(50 MG) BY MOUTH TWICE DAILY 60 tablet 0   No current facility-administered medications on file prior to visit.        ROS:  All others reviewed and negative.  Objective        PE:  BP 136/90    Pulse 85    Temp 98.7 F (37.1 C) (Oral)    Ht 5' 3.25" (1.607 m)    Wt 238 lb 9.6 oz (108.2 kg)    SpO2 98%    BMI 41.93 kg/m                 Constitutional: Pt appears in NAD               HENT: Head: NCAT.                Right Ear: External ear normal.                 Left Ear: External ear normal.                Eyes: . Pupils are equal, round, and reactive to light. Conjunctivae and EOM are normal               Nose: without d/c or deformity               Neck: Neck supple. Gross normal ROM               Cardiovascular: Normal rate and regular rhythm.                 Pulmonary/Chest: Effort normal and breath sounds without rales or wheezing - exam benign               Abd:  Soft, NT,  ND, + BS, no organomegaly               Neurological: Pt is alert. At baseline orientation, motor grossly intact               Skin: Skin is warm. No rashes, no other new lesions, LE edema - none               Psychiatric: Pt behavior is normal without agitation ;1+ nervous  Micro: none  Cardiac tracings I have personally interpreted today:  none  Pertinent Radiological findings (summarize): none   Lab Results  Component Value Date   WBC 10.0 02/23/2021   HGB 15.8 (H) 02/23/2021   HCT 48.8 (H) 02/23/2021   PLT 348.0 02/23/2021   GLUCOSE 100 (H) 02/23/2021   CHOL 234 (H) 02/23/2021   TRIG 137.0 02/23/2021   HDL 48.80 02/23/2021   LDLDIRECT 194.2 05/31/2007   LDLCALC 158 (H) 02/23/2021   ALT 12 02/23/2021   AST  18 02/23/2021   NA 141 02/23/2021   K 4.1 02/23/2021   CL 105 02/23/2021   CREATININE 0.93 02/23/2021   BUN 14 02/23/2021   CO2 34 (H) 02/23/2021   TSH 2.43 02/23/2021   HGBA1C 5.6 02/23/2021   Assessment/Plan:  [name] is a 71 y.o. Black or African American [2] female with  has a past medical history of Ankylosis, Anxiety, Arthritis, Asthma, Asthma, Biliary dyskinesia, Bowel obstruction ([place]), BRCA negative (02/2013), Cervical dysplasia, Complication of anesthesia, Depression, Diverticulosis, Elevated cholesterol, Endometriosis, Fibromyalgia, GERD (gastroesophageal reflux disease), Heart murmur, oral aphthous ulcers, Hypertension, Hypertension, Hypothyroidism, Impaired glucose tolerance (02/25/2011), Low back pain, Migraine, Migraines, Nocturia, Polycythemia vera ([place]), Shingles, Shortness of breath, Sickle cell trait ([place]), Sleep apnea, Thyroid disease, and Thyroid disease.  Encounter for preventative adult health care exam with abnormal findings Age and sex appropriate education and counseling updated with regular exercise and diet Referrals for preventative services - plans to call herself for gyn soon, mammogram, declines colonoscopy for now Immunizations  addressed - declines covid booster, shingrix, pneumovax tdap Smoking counseling  - none needed Evidence for depression or other mood disorder - chronic anxiety stable per pt Most recent labs reviewed. I have personally reviewed and have noted: 1) the patient's medical and social history 2) The patient's current medications and supplements 3) The patient's height, weight, and BMI have been recorded in the chart   Polycythemia, secondary For f/u heme referral per pt reqeust, cbc with labs  COPD (chronic obstructive pulmonary disease) Exam without exacerbation today, pt reassured, cont inhaler prn to f/u any worsening symptoms or concerns  Anxiety state Chronic stable per pt, declines need for change in tx or psychiatry or counsling for now  Essential hypertension . BP Readings from Last 3 Encounters:  02/23/21 136/90  11/26/20 114/64  09/07/20 (!) 142/90   Stable, pt to continue medical treatment norvasc   GERD Chronic stable, recent EGD normal dec 2022, cont PPI  HLD (hyperlipidemia) Lab Results  Component Value Date   LDLCALC 158 (H) 02/23/2021   Severe uncontrolled, goal ldl < 100 , pt to continue low chol diet, declines statin or zetia for now   Hypothyroidism Lab Results  Component Value Date   TSH 2.43 02/23/2021   Stable, pt to continue levothyroxine   Pre-diabetes Lab Results  Component Value Date   HGBA1C 5.6 02/23/2021   Stable, pt to continue current medical treatment  - diet   Abdominal pain For GI f/u as per pt request  Followup: No follow-ups on file.  Cathlean Cower, MD 02/27/2021 2:56 PM [facility]

## 2021-02-24 ENCOUNTER — Telehealth: Payer: Self-pay | Admitting: Hematology and Oncology

## 2021-02-24 LAB — URINALYSIS, ROUTINE W REFLEX MICROSCOPIC
Bilirubin Urine: NEGATIVE
Hgb urine dipstick: NEGATIVE
Ketones, ur: NEGATIVE
Leukocytes,Ua: NEGATIVE
Nitrite: NEGATIVE
RBC / HPF: NONE SEEN (ref 0–?)
Specific Gravity, Urine: 1.025 (ref 1.000–1.030)
Total Protein, Urine: NEGATIVE
Urine Glucose: NEGATIVE
Urobilinogen, UA: 0.2 (ref 0.0–1.0)
pH: 6 (ref 5.0–8.0)

## 2021-02-24 LAB — HEMOGLOBIN A1C: Hgb A1c MFr Bld: 5.6 % (ref 4.6–6.5)

## 2021-02-24 NOTE — Telephone Encounter (Signed)
Scheduled appt per 2/1 referral. Pt is aware of appt date and time. Pt is aware to arrive 15 mins prior to appt time.

## 2021-02-26 ENCOUNTER — Encounter: Payer: Self-pay | Admitting: Internal Medicine

## 2021-02-27 NOTE — Assessment & Plan Note (Signed)
Lab Results  Component Value Date   HGBA1C 5.6 02/23/2021   Stable, pt to continue current medical treatment  - diet

## 2021-02-27 NOTE — Assessment & Plan Note (Signed)
Lab Results  Component Value Date   LDLCALC 158 (H) 02/23/2021   Severe uncontrolled, goal ldl < 100 , pt to continue low chol diet, declines statin or zetia for now

## 2021-02-27 NOTE — Assessment & Plan Note (Signed)
Lab Results  Component Value Date   TSH 2.43 02/23/2021   Stable, pt to continue levothyroxine

## 2021-02-27 NOTE — Assessment & Plan Note (Signed)
Chronic stable per pt, declines need for change in tx or psychiatry or counsling for now

## 2021-02-27 NOTE — Assessment & Plan Note (Signed)
For f/u heme referral per pt reqeust, cbc with labs

## 2021-02-27 NOTE — Assessment & Plan Note (Signed)
. °  BP Readings from Last 3 Encounters:  02/23/21 136/90  11/26/20 114/64  09/07/20 (!) 142/90   Stable, pt to continue medical treatment norvasc

## 2021-02-27 NOTE — Assessment & Plan Note (Signed)
Age and sex appropriate education and counseling updated with regular exercise and diet Referrals for preventative services - plans to call herself for gyn soon, mammogram, declines colonoscopy for now Immunizations addressed - declines covid booster, shingrix, pneumovax tdap Smoking counseling  - none needed Evidence for depression or other mood disorder - chronic anxiety stable per pt Most recent labs reviewed. I have personally reviewed and have noted: 1) the patient's medical and social history 2) The patient's current medications and supplements 3) The patient's height, weight, and BMI have been recorded in the chart

## 2021-02-27 NOTE — Assessment & Plan Note (Signed)
Exam without exacerbation today, pt reassured, cont inhaler prn to f/u any worsening symptoms or concerns

## 2021-02-27 NOTE — Assessment & Plan Note (Signed)
Chronic stable, recent EGD normal dec 2022, cont PPI

## 2021-02-27 NOTE — Assessment & Plan Note (Signed)
For GI f/u as per pt request

## 2021-02-28 ENCOUNTER — Encounter: Payer: Self-pay | Admitting: Internal Medicine

## 2021-02-28 MED ORDER — OZEMPIC (0.25 OR 0.5 MG/DOSE) 2 MG/1.5ML ~~LOC~~ SOPN: 0.5000 mg | PEN_INJECTOR | SUBCUTANEOUS | 3 refills | Status: DC

## 2021-03-03 DIAGNOSIS — M1990 Unspecified osteoarthritis, unspecified site: Secondary | ICD-10-CM | POA: Diagnosis not present

## 2021-03-03 DIAGNOSIS — Z79899 Other long term (current) drug therapy: Secondary | ICD-10-CM | POA: Diagnosis not present

## 2021-03-10 DIAGNOSIS — M25569 Pain in unspecified knee: Secondary | ICD-10-CM | POA: Diagnosis not present

## 2021-03-10 DIAGNOSIS — M797 Fibromyalgia: Secondary | ICD-10-CM | POA: Diagnosis not present

## 2021-03-10 DIAGNOSIS — M545 Low back pain, unspecified: Secondary | ICD-10-CM | POA: Diagnosis not present

## 2021-03-10 DIAGNOSIS — D45 Polycythemia vera: Secondary | ICD-10-CM | POA: Diagnosis not present

## 2021-03-10 DIAGNOSIS — M456 Ankylosing spondylitis lumbar region: Secondary | ICD-10-CM | POA: Diagnosis not present

## 2021-03-10 DIAGNOSIS — G8929 Other chronic pain: Secondary | ICD-10-CM | POA: Diagnosis not present

## 2021-03-15 ENCOUNTER — Inpatient Hospital Stay: Payer: Medicare PPO

## 2021-03-15 ENCOUNTER — Encounter: Payer: Self-pay | Admitting: Hematology and Oncology

## 2021-03-15 ENCOUNTER — Other Ambulatory Visit: Payer: Self-pay

## 2021-03-15 ENCOUNTER — Inpatient Hospital Stay: Payer: Medicare PPO | Admitting: Hematology and Oncology

## 2021-03-15 ENCOUNTER — Inpatient Hospital Stay: Payer: Medicare PPO | Attending: Hematology and Oncology

## 2021-03-15 VITALS — BP 117/105 | HR 102 | Temp 100.1°F | Resp 18 | Ht 66.0 in | Wt 242.0 lb

## 2021-03-15 DIAGNOSIS — D45 Polycythemia vera: Secondary | ICD-10-CM | POA: Diagnosis not present

## 2021-03-15 DIAGNOSIS — Z8041 Family history of malignant neoplasm of ovary: Secondary | ICD-10-CM | POA: Insufficient documentation

## 2021-03-15 DIAGNOSIS — M459 Ankylosing spondylitis of unspecified sites in spine: Secondary | ICD-10-CM

## 2021-03-15 DIAGNOSIS — D751 Secondary polycythemia: Secondary | ICD-10-CM | POA: Insufficient documentation

## 2021-03-15 DIAGNOSIS — Z803 Family history of malignant neoplasm of breast: Secondary | ICD-10-CM

## 2021-03-15 DIAGNOSIS — G4733 Obstructive sleep apnea (adult) (pediatric): Secondary | ICD-10-CM | POA: Insufficient documentation

## 2021-03-15 DIAGNOSIS — Z800 Family history of malignant neoplasm of digestive organs: Secondary | ICD-10-CM

## 2021-03-15 DIAGNOSIS — Z90710 Acquired absence of both cervix and uterus: Secondary | ICD-10-CM | POA: Diagnosis not present

## 2021-03-15 DIAGNOSIS — I10 Essential (primary) hypertension: Secondary | ICD-10-CM | POA: Insufficient documentation

## 2021-03-15 LAB — CBC WITH DIFFERENTIAL (CANCER CENTER ONLY)
Abs Immature Granulocytes: 0.02 10*3/uL (ref 0.00–0.07)
Basophils Absolute: 0 10*3/uL (ref 0.0–0.1)
Basophils Relative: 0 %
Eosinophils Absolute: 0.1 10*3/uL (ref 0.0–0.5)
Eosinophils Relative: 1 %
HCT: 47 % — ABNORMAL HIGH (ref 36.0–46.0)
Hemoglobin: 15.4 g/dL — ABNORMAL HIGH (ref 12.0–15.0)
Immature Granulocytes: 0 %
Lymphocytes Relative: 20 %
Lymphs Abs: 1.7 10*3/uL (ref 0.7–4.0)
MCH: 28.8 pg (ref 26.0–34.0)
MCHC: 32.8 g/dL (ref 30.0–36.0)
MCV: 87.9 fL (ref 80.0–100.0)
Monocytes Absolute: 0.5 10*3/uL (ref 0.1–1.0)
Monocytes Relative: 6 %
Neutro Abs: 6.3 10*3/uL (ref 1.7–7.7)
Neutrophils Relative %: 73 %
Platelet Count: 337 10*3/uL (ref 150–400)
RBC: 5.35 MIL/uL — ABNORMAL HIGH (ref 3.87–5.11)
RDW: 15.7 % — ABNORMAL HIGH (ref 11.5–15.5)
WBC Count: 8.6 10*3/uL (ref 4.0–10.5)
nRBC: 0 % (ref 0.0–0.2)

## 2021-03-15 LAB — SEDIMENTATION RATE: Sed Rate: 11 mm/hr (ref 0–22)

## 2021-03-15 NOTE — Assessment & Plan Note (Signed)
The patient has diagnosis of ankylosing spondylitis, fibromyalgia and rheumatoid arthritis She has diffuse bone pain and shared that she had significant chest pain on Saturday that resolved with additional aspirin therapy I recommend the patient to contact her primary care doctor for evaluation She might need further cardiac evaluation to rule out cardiac source of chest pain I have reviewed previous documentation by cardiologist Coronary calcium score several years ago was 0 and she has normal echocardiogram and stress test

## 2021-03-15 NOTE — Assessment & Plan Note (Signed)
She has very high blood pressure today, could be caused by anxiety versus secondary hyperviscosity from high hemoglobin I am hopeful that phlebotomy will help I also recommend the patient to consider reducing the dose of her hormone replacement therapy but the patient declined due to previous intolerance to taper I would defer to her primary care doctor for medication adjustments

## 2021-03-15 NOTE — Progress Notes (Signed)
[facility] CONSULT NOTE  Patient Care Team: Corwin Levins, MD as PCP - General O'Buch, Edgardo Roys, PA-C as Physician Assistant (Internal Medicine)   ASSESSMENT & PLAN Polycythemia, secondary She has extensive evaluation in the past with negative JAK2 mutation The patient has absolute concern that she might have undiagnosed polycythemia vera and requested bone marrow biopsy I told her updated peripheral blood evaluation with next generation sequencing The sensitivity of picking up the disease is as high as 97%  The risk of missing a diagnosis is 3% or less At this point in time, I do not recommend bone marrow aspirate and biopsy The most likely cause is due to undertreated obstructive sleep apnea I will call her and set up virtual visit in 2 weeks to review test results If the patient insists she wants to proceed with bone marrow biopsy, we will proceed with that Today, due to her high blood pressure and symptomatic polycythemia, I recommend a unit of phlebotomy The risk, benefits, side effects of phlebotomy with discussed with the patient and she is in agreement to proceed She will continue aspirin therapy  OSA (obstructive sleep apnea) According to the patient, she is only using a mouthguard It does not appear that is adequate to treat her sleep apnea that could trigger uncontrolled polycythemia I would defer to her sleep medicine physician for management  Essential hypertension She has very high blood pressure today, could be caused by anxiety versus secondary hyperviscosity from high hemoglobin I am hopeful that phlebotomy will help I also recommend the patient to consider reducing the dose of her hormone replacement therapy but the patient declined due to previous intolerance to taper I would defer to her primary care doctor for medication adjustments  Ankylosing spondylitis (HCC) The patient has diagnosis of ankylosing spondylitis, fibromyalgia and rheumatoid  arthritis She has diffuse bone pain and shared that she had significant chest pain on Saturday that resolved with additional aspirin therapy I recommend the patient to contact her primary care doctor for evaluation She might need further cardiac evaluation to rule out cardiac source of chest pain I have reviewed previous documentation by cardiologist Coronary calcium score several years ago was 0 and she has normal echocardiogram and stress test  Orders Placed This Encounter  Procedures   CBC with Differential (Cancer Center Only)    Standing Status:   Future    Number of Occurrences:   1    Standing Expiration Date:   03/15/2022   Sedimentation rate    Standing Status:   Future    Number of Occurrences:   1    Standing Expiration Date:   03/15/2022   Erythropoietin    Standing Status:   Future    Number of Occurrences:   1    Standing Expiration Date:   03/15/2022   JAK2 (including V617F and Exon 12), MPL, and CALR-Next Generation Sequencing    Standing Status:   Future    Number of Occurrences:   1    Standing Expiration Date:   03/15/2022    The total time spent in the appointment was 60 minutes encounter with patients including review of chart and various tests results, discussions about plan of care and coordination of care plan   All questions were answered. The patient knows to call the clinic with any problems, questions or concerns. No barriers to learning was detected.  Artis Delay, MD 2/21/20233:12 PM     CHIEF COMPLAINTS/PURPOSE OF CONSULTATION:  Erythrocytosis  HISTORY OF PRESENTING ILLNESS:  [name] 71 y.o. female is here because of elevated hemoglobin.  She was found to have abnormal CBC from high hemoglobin.  She was seen back in 2016, transfer of care from prior hematologist and has done extensive work-up.  At the point in time, she was noted to have high testosterone level and sleep apnea that is contributing to high hemoglobin.  JAK2 mutation  study was negative and she was discharged from the clinic. She is being referred back again due to persistent elevated hemoglobin and concern for undiagnosed polycythemia vera The patient felt that she have itchy skin, possible splenomegaly and others in with diffuse bone pain.  She felt better if somebody would do a bone marrow biopsy to rule out cancer in her bone. The patient also has diagnosis of fibromyalgia, ankylosing spondylitis and rheumatoid arthritis She is on multiple medications for this She complained of atypical chest pain lasted for several hours on Saturday but that has resolved She complains of frequent abdominal cramping  She never suffer from diagnosis of blood clot.  She does not smoke.  She was diagnosed with obstructive sleep apnea but does not use a CPAP machine  MEDICAL HISTORY:  Past Medical History:  Diagnosis Date   Ankylosis    Anxiety    Arthritis    Asthma    Asthma    Biliary dyskinesia    Bowel obstruction (HCC)    BRCA negative 02/2013   Cervical dysplasia    Complication of anesthesia    "I have the shakes"   Depression    Diverticulosis    Elevated cholesterol    Endometriosis    Fibromyalgia    GERD (gastroesophageal reflux disease)    Heart murmur    mvp   Hx of oral aphthous ulcers    Hypertension    Hypertension    no med   Hypothyroidism    hx nodule on thyroid    Impaired glucose tolerance 02/25/2011   Low back pain    Migraine    Migraines    Nocturia    Polycythemia vera (HCC)    Shingles    Shortness of breath    with exertion   Sickle cell trait (HCC)    Sleep apnea    has not used c-pap x 1 yr. use mouthguard now   Thyroid disease    Thyroid disease    Hypothyroid    SURGICAL HISTORY: Past Surgical History:  Procedure Laterality Date   ABDOMINAL HYSTERECTOMY     BREAST EXCISIONAL BIOPSY Left 1999   BREAST SURGERY     Cystectomy on Left breast   BREAST SURGERY     Benign breast cyst   CHOLECYSTECTOMY      CHOLECYSTECTOMY  12/05/2011   Procedure: LAPAROSCOPIC CHOLECYSTECTOMY;  Surgeon: Axel Filler, MD;  Location: [facility];  Service: General;  Laterality: N/A;   COLPOSCOPY     GYNECOLOGIC CRYOSURGERY     HERNIA REPAIR     abdomen   JOINT REPLACEMENT Right    hip   OOPHORECTOMY     BSO   PANNICULECTOMY N/A 06/12/2014   Procedure: PANNICULECTOMY;  Surgeon: Glenna Fellows, MD;  Location: [facility];  Service: Plastics;  Laterality: N/A;   PELVIC LAPAROSCOPY  1995,2002   DL LSO DL RSO   TONSILLECTOMY     TOTAL HIP ARTHROPLASTY     Right   VAGINAL HYSTERECTOMY  1983    SOCIAL HISTORY: Social History  Socioeconomic History   Marital status: Single    Spouse name: Not on file   Number of children: 2   Years of education: 2.5 years of college   Highest education level: Not on file  Occupational History   Occupation: retired    Associate Professor: RETIRED  Tobacco Use   Smoking status: Never   Smokeless tobacco: Never  Vaping Use   Vaping Use: Never used  Substance and Sexual Activity   Alcohol use: Not Currently    Comment: socially   Drug use: Never   Sexual activity: Not Currently    Birth control/protection: Surgical    Comment: HYST-1st intercourse 17 yo-5 partners  Other Topics Concern   Not on file  Social History Narrative   Lives at home alone.   Right-handed.   6 cups per week.   Social Determinants of Health   Financial Resource Strain: Not on file  Food Insecurity: Not on file  Transportation Needs: Not on file  Physical Activity: Not on file  Stress: Not on file  Social Connections: Not on file  Intimate Partner Violence: Not on file    FAMILY HISTORY: Family History  Problem Relation Age of Onset   Hypertension Mother    Heart disease Mother    Breast cancer Mother        Age 71   Bone cancer Mother    Hypertension Father    Heart disease Father    Diabetes Father    Colon cancer Sister 66   Sarcoidosis Sister    Breast cancer  Sister 13   Breast cancer Maternal Grandmother        Age 71   Ovarian cancer Paternal Grandmother    Breast cancer Paternal Grandmother        Age unknown   Sarcoidosis Brother    Breast cancer Maternal Aunt        Age 19's   Breast cancer Maternal Aunt        Age 62   Heart failure Father    Arthritis Father    Colon cancer Sister    Sarcoidosis Sister    Multiple sclerosis Cousin     ALLERGIES:  is allergic to duloxetine, ezetimibe, lyrica [pregabalin], milnacipran, rofecoxib, ultram [tramadol hcl], celebrex [celecoxib], codeine, erythromycin, escitalopram oxalate, gluten meal, lactose intolerance (gi), and statins.  MEDICATIONS:  Current Outpatient Medications  Medication Sig Dispense Refill   Adalimumab (HUMIRA) 40 MG/0.4ML PSKT Inject into the skin every 14 (fourteen) days.     albuterol (PROVENTIL HFA;VENTOLIN HFA) 108 (90 Base) MCG/ACT inhaler Inhale 2 puffs into the lungs every 6 (six) hours as needed for wheezing. For wheezing 3.7 g 11   amLODipine (NORVASC) 5 MG tablet TAKE 1 TABLET(5 MG) BY MOUTH DAILY Strength: 5 mg 90 tablet 3   aspirin EC 81 MG EC tablet Take 1 tablet (81 mg total) by mouth daily. 30 tablet 0   Calcium Carbonate-Vitamin D (CALCIUM + D PO) Take 2 tablets by mouth at bedtime.      cyclobenzaprine (FLEXERIL) 5 MG tablet Take 5 mg by mouth as needed for muscle spasms.     diclofenac sodium (VOLTAREN) 1 % GEL Apply 3 grams to 3 large joints up to 3 times daily. 3 Tube 3   estradiol (VIVELLE-DOT) 0.075 MG/24HR Place 1 patch onto the skin 2 (two) times a week. 24 patch 4   fentaNYL (DURAGESIC - DOSED MCG/HR) 25 MCG/HR Place 1 patch onto the skin every 3 (three) days.  fluticasone (FLONASE) 50 MCG/ACT nasal spray Place 2 sprays into both nostrils as needed. (Patient taking differently: Place 2 sprays into both nostrils as needed for allergies or rhinitis.) 16 g 11   folic acid (FOLVITE) 1 MG tablet Take 1 mg by mouth daily.     furosemide (LASIX) 20 MG  tablet Take 1 tablet (20 mg total) by mouth daily. 90 tablet 3   lansoprazole (PREVACID) 30 MG capsule Take 1 capsule (30 mg total) by mouth daily at 12 noon. 90 capsule 3   lidocaine (LIDODERM) 5 % Place 1 patch onto the skin daily. Remove & Discard patch within 12 hours or as directed by MD 30 patch 1   methotrexate (RHEUMATREX) 2.5 MG tablet Take 10 mg by mouth once a week. Take 4 2.5 mg tablets by mouth weekly. Caution:Chemotherapy. Protect from light.     NONFORMULARY OR COMPOUNDED ITEM Boric Acid Capsules 600 mg. #30 Insert one cap intravaginally twice weekly. 30 each 2   polyethylene glycol powder (MIRALAX) powder Take 17 g by mouth as needed. For constipation 255 g 0   Semaglutide,0.25 or 0.5MG /DOS, (OZEMPIC, 0.25 OR 0.5 MG/DOSE,) 2 MG/1.5ML SOPN Inject 0.5 mg into the skin once a week. 4.5 mL 3   thyroid (NP THYROID) 30 MG tablet TAKE 1 TABLET(30 MG) BY MOUTH DAILY BEFORE BREAKFAST 90 tablet 2   topiramate (TOPAMAX) 50 MG tablet TAKE 1 TABLET(50 MG) BY MOUTH TWICE DAILY 60 tablet 0   No current facility-administered medications for this visit.    REVIEW OF SYSTEMS:   Constitutional: Denies fevers, chills or abnormal night sweats Eyes: Denies blurriness of vision, double vision or watery eyes Ears, nose, mouth, throat, and face: Denies mucositis or sore throat Respiratory: Denies cough, dyspnea or wheezes Gastrointestinal:  Denies nausea, heartburn or change in bowel habits Skin: Denies abnormal skin rashes Lymphatics: Denies new lymphadenopathy or easy bruising Behavioral/Psych: Mood is stable, no new changes  All other systems were reviewed with the patient and are negative.  PHYSICAL EXAMINATION: ECOG PERFORMANCE STATUS: 1 - Symptomatic but completely ambulatory  Vitals:   03/15/21 1353  BP: (!) 117/105  Pulse: (!) 102  Resp: 18  Temp: 100.1 F (37.8 C)  SpO2: 100%   Filed Weights   03/15/21 1353  Weight: 242 lb (109.8 kg)    GENERAL:alert, no distress and  comfortable SKIN: skin color, texture, turgor are normal, no rashes or significant lesions EYES: normal, conjunctiva are pink and non-injected, sclera clear OROPHARYNX:no exudate, no erythema and lips, buccal mucosa, and tongue normal  NECK: supple, thyroid normal size, non-tender, without nodularity LYMPH:  no palpable lymphadenopathy in the cervical, axillary or inguinal LUNGS: clear to auscultation and percussion with normal breathing effort HEART: regular rate & rhythm and no murmurs and no lower extremity edema ABDOMEN:abdomen soft, non-tender and normal bowel sounds.  No splenomegaly.  Limited examination due to central obesity Musculoskeletal:no cyanosis of digits and no clubbing  PSYCH: alert & oriented x 3 with fluent speech NEURO: no focal motor/sensory deficits  LABORATORY DATA:  I have reviewed the data as listed Recent Results (from the past 2160 hour(s))  Vitamin B12     Status: None   Collection Time: 02/23/21  4:05 PM  Result Value Ref Range   Vitamin B-12 295 211 - 911 pg/mL  VITAMIN D 25 Hydroxy (Vit-D Deficiency, Fractures)     Status: None   Collection Time: 02/23/21  4:05 PM  Result Value Ref Range   VITD 45.65 30.00 -  100.00 ng/mL  T4, free     Status: None   Collection Time: 02/23/21  4:05 PM  Result Value Ref Range   Free T4 0.77 0.60 - 1.60 ng/dL    Comment: Specimens from patients who are undergoing biotin therapy and /or ingesting biotin supplements may contain high levels of biotin.  The higher biotin concentration in these specimens interferes with this Free T4 assay.  Specimens that contain high levels  of biotin may cause false high results for this Free T4 assay.  Please interpret results in light of the total clinical presentation of the patient.    Basic metabolic panel     Status: Abnormal   Collection Time: 02/23/21  4:05 PM  Result Value Ref Range   Sodium 141 135 - 145 mEq/L   Potassium 4.1 3.5 - 5.1 mEq/L   Chloride 105 96 - 112 mEq/L   CO2  34 (H) 19 - 32 mEq/L   Glucose, Bld 100 (H) 70 - 99 mg/dL   BUN 14 6 - 23 mg/dL   Creatinine, Ser 0.93 0.40 - 1.20 mg/dL   GFR 63.25 >60.00 mL/min    Comment: Calculated using the CKD-EPI Creatinine Equation (2021)   Calcium 9.4 8.4 - 10.5 mg/dL  Urinalysis, Routine w reflex microscopic     Status: Abnormal   Collection Time: 02/23/21  4:05 PM  Result Value Ref Range   Color, Urine YELLOW Yellow;Lt. Yellow;Straw;Dark Yellow;Amber;Green;Red;Brown   APPearance Cloudy (A) Clear;Turbid;Slightly Cloudy;Cloudy   Specific Gravity, Urine 1.025 1.000 - 1.030   pH 6.0 5.0 - 8.0   Total Protein, Urine NEGATIVE Negative   Urine Glucose NEGATIVE Negative   Ketones, ur NEGATIVE Negative   Bilirubin Urine NEGATIVE Negative   Hgb urine dipstick NEGATIVE Negative   Urobilinogen, UA 0.2 0.0 - 1.0   Leukocytes,Ua NEGATIVE Negative   Nitrite NEGATIVE Negative   WBC, UA 0-2/hpf 0-2/hpf   RBC / HPF none seen 0-2/hpf   Mucus, UA Presence of (A) None   Squamous Epithelial / LPF Many(>10/hpf) (A) Rare(0-4/hpf)   Ca Oxalate Crys, UA Presence of (A) None   Amorphous Present (A) None;Present  TSH     Status: None   Collection Time: 02/23/21  4:05 PM  Result Value Ref Range   TSH 2.43 0.35 - 5.50 uIU/mL  CBC with Differential/Platelet     Status: Abnormal   Collection Time: 02/23/21  4:05 PM  Result Value Ref Range   WBC 10.0 4.0 - 10.5 K/uL   RBC 5.53 (H) 3.87 - 5.11 Mil/uL   Hemoglobin 15.8 (H) 12.0 - 15.0 g/dL   HCT 48.8 (H) 36.0 - 46.0 %   MCV 88.2 78.0 - 100.0 fl   MCHC 32.5 30.0 - 36.0 g/dL   RDW 14.8 11.5 - 15.5 %   Platelets 348.0 150.0 - 400.0 K/uL   Neutrophils Relative % 67.9 43.0 - 77.0 %   Lymphocytes Relative 26.0 12.0 - 46.0 %   Monocytes Relative 4.6 3.0 - 12.0 %   Eosinophils Relative 0.8 0.0 - 5.0 %   Basophils Relative 0.7 0.0 - 3.0 %   Neutro Abs 6.8 1.4 - 7.7 K/uL   Lymphs Abs 2.6 0.7 - 4.0 K/uL   Monocytes Absolute 0.5 0.1 - 1.0 K/uL   Eosinophils Absolute 0.1 0.0 - 0.7  K/uL   Basophils Absolute 0.1 0.0 - 0.1 K/uL  Hepatic function panel     Status: None   Collection Time: 02/23/21  4:05 PM  Result Value Ref  Range   Total Bilirubin 0.4 0.2 - 1.2 mg/dL   Bilirubin, Direct 0.0 0.0 - 0.3 mg/dL   Alkaline Phosphatase 65 39 - 117 U/L   AST 18 0 - 37 U/L   ALT 12 0 - 35 U/L   Total Protein 7.8 6.0 - 8.3 g/dL   Albumin 4.0 3.5 - 5.2 g/dL  Lipid panel     Status: Abnormal   Collection Time: 02/23/21  4:05 PM  Result Value Ref Range   Cholesterol 234 (H) 0 - 200 mg/dL    Comment: ATP III Classification       Desirable:  < 200 mg/dL               Borderline High:  200 - 239 mg/dL          High:  > = 240 mg/dL   Triglycerides 137.0 0.0 - 149.0 mg/dL    Comment: Normal:  <150 mg/dLBorderline High:  150 - 199 mg/dL   HDL 48.80 >39.00 mg/dL   VLDL 27.4 0.0 - 40.0 mg/dL   LDL Cholesterol 158 (H) 0 - 99 mg/dL   Total CHOL/HDL Ratio 5     Comment:                Men          Women1/2 Average Risk     3.4          3.3Average Risk          5.0          4.42X Average Risk          9.6          7.13X Average Risk          15.0          11.0                       NonHDL 185.65     Comment: NOTE:  Non-HDL goal should be 30 mg/dL higher than patient's LDL goal (i.e. LDL goal of < 70 mg/dL, would have non-HDL goal of < 100 mg/dL)  Hemoglobin A1c     Status: None   Collection Time: 02/23/21  4:05 PM  Result Value Ref Range   Hgb A1c MFr Bld 5.6 4.6 - 6.5 %    Comment: Glycemic Control Guidelines for People with Diabetes:Non Diabetic:  <6%Goal of Therapy: <7%Additional Action Suggested:  >8%   CBC with Differential (Cancer Center Only)     Status: Abnormal   Collection Time: 03/15/21  2:43 PM  Result Value Ref Range   WBC Count 8.6 4.0 - 10.5 K/uL   RBC 5.35 (H) 3.87 - 5.11 MIL/uL   Hemoglobin 15.4 (H) 12.0 - 15.0 g/dL   HCT 47.0 (H) 36.0 - 46.0 %   MCV 87.9 80.0 - 100.0 fL   MCH 28.8 26.0 - 34.0 pg   MCHC 32.8 30.0 - 36.0 g/dL   RDW 15.7 (H) 11.5 - 15.5 %   Platelet  Count 337 150 - 400 K/uL   nRBC 0.0 0.0 - 0.2 %   Neutrophils Relative % 73 %   Neutro Abs 6.3 1.7 - 7.7 K/uL   Lymphocytes Relative 20 %   Lymphs Abs 1.7 0.7 - 4.0 K/uL   Monocytes Relative 6 %   Monocytes Absolute 0.5 0.1 - 1.0 K/uL   Eosinophils Relative 1 %   Eosinophils Absolute 0.1 0.0 - 0.5 K/uL   Basophils  Relative 0 %   Basophils Absolute 0.0 0.0 - 0.1 K/uL   Immature Granulocytes 0 %   Abs Immature Granulocytes 0.02 0.00 - 0.07 K/uL    Comment: Performed at [facility], [place] [address], [place], [place] [address]    RADIOGRAPHIC STUDIES: I have reviewed CT imaging from April 2021 and did not see an enlarged spleen I have personally reviewed the radiological images as listed and agreed with the findings in the report.

## 2021-03-15 NOTE — Assessment & Plan Note (Signed)
She has extensive evaluation in the past with negative JAK2 mutation The patient has absolute concern that she might have undiagnosed polycythemia vera and requested bone marrow biopsy I told her updated peripheral blood evaluation with next generation sequencing The sensitivity of picking up the disease is as high as 97%  The risk of missing a diagnosis is 3% or less At this point in time, I do not recommend bone marrow aspirate and biopsy The most likely cause is due to undertreated obstructive sleep apnea I will call her and set up virtual visit in 2 weeks to review test results If the patient insists she wants to proceed with bone marrow biopsy, we will proceed with that Today, due to her high blood pressure and symptomatic polycythemia, I recommend a unit of phlebotomy The risk, benefits, side effects of phlebotomy with discussed with the patient and she is in agreement to proceed She will continue aspirin therapy

## 2021-03-15 NOTE — Assessment & Plan Note (Signed)
According to the patient, she is only using a mouthguard It does not appear that is adequate to treat her sleep apnea that could trigger uncontrolled polycythemia I would defer to her sleep medicine physician for management

## 2021-03-15 NOTE — Patient Instructions (Signed)
Therapeutic Phlebotomy °Therapeutic phlebotomy is the planned removal of blood from a person's body for the purpose of treating a medical condition. The procedure is lot like donating blood. Usually, about a pint (470 mL, or 0.47 L) of blood is removed. The average adult has 9-12 pints (4.3-5.7 L) of blood in his or her body. °Therapeutic phlebotomy may be used to treat the following medical conditions: °Hemochromatosis. This is a condition in which the blood contains too much iron. °Polycythemia vera. This is a condition in which the blood contains too many red blood cells. °Porphyria cutanea tarda. This is a disease in which an important part of hemoglobin is not made properly. It results in the buildup of abnormal amounts of porphyrins in the body. °Sickle cell disease. This is a condition in which the red blood cells form an abnormal crescent shape rather than a round shape. °Tell a health care provider about: °Any allergies you have. °All medicines you are taking, including vitamins, herbs, eye drops, creams, and over-the-counter medicines. °Any bleeding problems you have. °Any surgeries you have had. °Any medical conditions you have. °Whether you are pregnant or may be pregnant. °What are the risks? °Generally, this is a safe procedure. However, problems may occur, including: °Nausea or light-headedness. °Low blood pressure (hypotension). °Soreness, bleeding, swelling, or bruising at the needle insertion site. °Infection. °What happens before the procedure? °Ask your health care provider about: °Changing or stopping your regular medicines. This is especially important if you are taking diabetes medicines or blood thinners. °Taking medicines such as aspirin and ibuprofen. These medicines can thin your blood. Do not take these medicines unless your health care provider tells you to take them. °Taking over-the-counter medicines, vitamins, herbs, and supplements. °Wear clothing with sleeves that can be raised  above the elbow. °You may have a blood sample taken. °Your blood pressure, pulse rate, and breathing rate will be measured. °What happens during the procedure? ° °You may be given a medicine to numb the area (local anesthetic). °A tourniquet will be placed on your arm. °A needle will be put into one of your veins. °Tubing and a collection bag will be attached to the needle. °Blood will flow through the needle and tubing into the collection bag. °The collection bag will be placed lower than your arm so gravity can help the blood flow into the bag. °You may be asked to open and close your hand slowly and continually during the entire collection. °After the specified amount of blood has been removed from your body, the collection bag and tubing will be clamped. °The needle will be removed from your vein. °Pressure will be held on the needle site to stop the bleeding. °A bandage (dressing) will be placed over the needle insertion site. °The procedure may vary among health care providers and hospitals. °What happens after the procedure? °Your blood pressure, pulse rate, and breathing rate will be measured after the procedure. °You will be encouraged to drink fluids. °You will be encouraged to eat a snack to prevent a low blood sugar level. °Your recovery will be assessed and monitored. °Return to your normal activities as told by your health care provider. °Summary °Therapeutic phlebotomy is the planned removal of blood from a person's body for the purpose of treating a medical condition. °Therapeutic phlebotomy may be used to treat hemochromatosis, polycythemia vera, porphyria cutanea tarda, or sickle cell disease. °In the procedure, a needle is inserted and about a pint (470 mL, or 0.47 L) of blood is   removed. The average adult has 9-12 pints (4.3-5.7 L) of blood in the body. °This is generally a safe procedure, but it can sometimes cause problems such as nausea, light-headedness, or low blood pressure  (hypotension). °This information is not intended to replace advice given to you by your health care provider. Make sure you discuss any questions you have with your health care provider. °Document Revised: 07/07/2020 Document Reviewed: 07/07/2020 °Elsevier Patient Education © 2022 [employer] ° °

## 2021-03-15 NOTE — Progress Notes (Signed)
Pt presented for a phlebotomy procedure per MD orders. 18g was used to [facility] after an unsuccessful attempt in [place]. Procedure started at 1542 and ended at 1558. 505g removed, pt tolerated well. Food and drink provided. Pt educated on phlebotomy procedure and potential side effects. Pt verbalized understanding and all questions answered at time of D/C. VSS and pt discharged in stable condition to lobby.

## 2021-03-16 ENCOUNTER — Other Ambulatory Visit: Payer: Self-pay | Admitting: Internal Medicine

## 2021-03-16 MED ORDER — SEMAGLUTIDE-WEIGHT MANAGEMENT 0.25 MG/0.5ML ~~LOC~~ SOAJ: 0.2500 mg | SUBCUTANEOUS | 0 refills | Status: AC

## 2021-03-16 MED ORDER — SEMAGLUTIDE-WEIGHT MANAGEMENT 1.7 MG/0.75ML ~~LOC~~ SOAJ: 1.7000 mg | SUBCUTANEOUS | 0 refills | Status: AC

## 2021-03-16 MED ORDER — SEMAGLUTIDE-WEIGHT MANAGEMENT 0.5 MG/0.5ML ~~LOC~~ SOAJ: 0.5000 mg | SUBCUTANEOUS | 0 refills | Status: AC

## 2021-03-16 MED ORDER — SEMAGLUTIDE-WEIGHT MANAGEMENT 2.4 MG/0.75ML ~~LOC~~ SOAJ: 2.4000 mg | SUBCUTANEOUS | 5 refills | Status: AC

## 2021-03-16 MED ORDER — SEMAGLUTIDE-WEIGHT MANAGEMENT 1 MG/0.5ML ~~LOC~~ SOAJ: 1.0000 mg | SUBCUTANEOUS | 0 refills | Status: AC

## 2021-03-17 LAB — ERYTHROPOIETIN: Erythropoietin: 24.2 m[IU]/mL — ABNORMAL HIGH (ref 2.6–18.5)

## 2021-03-18 ENCOUNTER — Encounter: Payer: Self-pay | Admitting: Hematology and Oncology

## 2021-03-22 LAB — JAK2 (INCLUDING V617F AND EXON 12), MPL,& CALR-NEXT GEN SEQ

## 2021-03-23 ENCOUNTER — Other Ambulatory Visit: Payer: Self-pay | Admitting: Internal Medicine

## 2021-03-23 NOTE — Telephone Encounter (Signed)
Please refill as per office routine med refill policy (all routine meds to be refilled for 3 mo or monthly (per pt preference) up to one year from last visit, then month to month grace period for 3 mo, then further med refills will have to be denied) ? ?

## 2021-03-31 ENCOUNTER — Other Ambulatory Visit: Payer: Self-pay

## 2021-03-31 ENCOUNTER — Inpatient Hospital Stay: Payer: Medicare PPO | Attending: Hematology and Oncology | Admitting: Hematology and Oncology

## 2021-03-31 ENCOUNTER — Encounter: Payer: Self-pay | Admitting: Hematology and Oncology

## 2021-03-31 ENCOUNTER — Telehealth: Payer: Self-pay | Admitting: Internal Medicine

## 2021-03-31 DIAGNOSIS — D751 Secondary polycythemia: Secondary | ICD-10-CM | POA: Diagnosis not present

## 2021-03-31 DIAGNOSIS — J438 Other emphysema: Secondary | ICD-10-CM | POA: Diagnosis not present

## 2021-03-31 DIAGNOSIS — R0789 Other chest pain: Secondary | ICD-10-CM | POA: Diagnosis not present

## 2021-03-31 DIAGNOSIS — Z862 Personal history of diseases of the blood and blood-forming organs and certain disorders involving the immune mechanism: Secondary | ICD-10-CM

## 2021-03-31 DIAGNOSIS — G4733 Obstructive sleep apnea (adult) (pediatric): Secondary | ICD-10-CM

## 2021-03-31 DIAGNOSIS — I10 Essential (primary) hypertension: Secondary | ICD-10-CM

## 2021-03-31 NOTE — Assessment & Plan Note (Signed)
According to the patient, she was told she have sickle cell trait based on the screening test 30 years ago ?The patient inquired whether this has changed ?It does not appear to me that the patient understood what sickle cell trait means ?I try my best to explain to the patient that sickle cell trait does not change in time ?We typically do not send screening test for sickle cell trait for confirmation ?Despite multiple explanation to the patient, she insists that she should get screening tests performed again ?I told her that she should discuss this with her primary care doctor first ?Testing her for sickle cell trait will not change management and insurance may or may not pay for the test ?

## 2021-03-31 NOTE — Assessment & Plan Note (Signed)
From my last visit, she has uncontrolled hypertension ?Despite my warning and recommendation to start monitoring her blood pressure from home, she has not been checking her blood pressure ?I recommend the patient to start monitoring her blood pressure at home ?I am wondering whether this could contribute to her nonspecific chest pain and dizziness ?

## 2021-03-31 NOTE — Assessment & Plan Note (Signed)
The patient had numerous, significant evaluation several years ago and repeat blood test recently including erythropoietin level and repeat JAK2 mutation testing are consistent with secondary polycythemia ?From my last visit, the patient insists that she should get phlebotomy ?After phlebotomy, she felt weak, tired and dizzy ?I tried to explain to the patient to the best of my ability the pathophysiology of secondary polycythemia ?She should not get any further phlebotomy in the future ?She should get evaluated again in the sleep apnea clinic ?I will reach out to her primary care doctor for referral ?

## 2021-03-31 NOTE — Progress Notes (Signed)
HEMATOLOGY-ONCOLOGY ELECTRONIC VISIT PROGRESS NOTE  Patient Care Team: Biagio Borg, MD as PCP - General O'Buch, Alvis Lemmings, PA-C as Physician Assistant (Internal Medicine)  I connected with the patient via telephone conference and verified that I am speaking with the correct person using two identifiers. The patient's location is at home and I am providing care from the [facility] I discussed the limitations, risks, security and privacy concerns of performing an evaluation and management service by e-visits and the availability of in person appointments.  I also discussed with the patient that there may be a patient responsible charge related to this service. The patient expressed understanding and agreed to proceed.   ASSESSMENT & PLAN:  Polycythemia, secondary The patient had numerous, significant evaluation several years ago and repeat blood test recently including erythropoietin level and repeat JAK2 mutation testing are consistent with secondary polycythemia From my last visit, the patient insists that she should get phlebotomy After phlebotomy, she felt weak, tired and dizzy I tried to explain to the patient to the best of my ability the pathophysiology of secondary polycythemia She should not get any further phlebotomy in the future She should get evaluated again in the sleep apnea clinic I will reach out to her primary care doctor for referral  Essential hypertension From my last visit, she has uncontrolled hypertension Despite my warning and recommendation to start monitoring her blood pressure from home, she has not been checking her blood pressure I recommend the patient to start monitoring her blood pressure at home I am wondering whether this could contribute to her nonspecific chest pain and dizziness  OSA (obstructive sleep apnea) She was last seen by pulmonologist around 2015 and was diagnosed with obstructive sleep apnea but the patient has not been using CPAP machine  due to poor compliance The patient is asking for referral I recommend discussion with primary care doctor for referral  Hx of sickle cell trait According to the patient, she was told she have sickle cell trait based on the screening test 30 years ago The patient inquired whether this has changed It does not appear to me that the patient understood what sickle cell trait means I try my best to explain to the patient that sickle cell trait does not change in time We typically do not send screening test for sickle cell trait for confirmation Despite multiple explanation to the patient, she insists that she should get screening tests performed again I told her that she should discuss this with her primary care doctor first Testing her for sickle cell trait will not change management and insurance may or may not pay for the test  Atypical chest pain She was evaluated by cardiologist many years ago with negative work-up Today, the patient insists that she have regular pain in her heart Again, I suspect could be uncontrolled hypertension The patient insists that she should be evaluated I will discuss this with her primary care doctor  COPD (chronic obstructive pulmonary disease) ([place]) She is using an inhaler that was prescribed by her primary care doctor The patient also insists that she should be evaluated for COPD because she believes lung disease could contribute to her polycythemia She was seen by pulmonologist many years ago I will reach out to her primary care doctor regarding her concern  No orders of the defined types were placed in this encounter.   INTERVAL HISTORY: Please see below for problem oriented charting. The purpose of today's discussion is to review recent test  results The patient had extensive evaluation many years ago for elevated hemoglobin and was found to have secondary polycythemia With our last visit, she was almost 100% sure she had polycythemia vera and  requested phlebotomy After phlebotomy, she felt terrible with dizziness, weakness lasting for many days In addition to reviewing her test results, she brought up concern about heart pain, shortness of breath, possible COPD diagnosis, concern for sickle cell trait disease and others  SUMMARY OF HEMATOLOGIC HISTORY: [name] 71 y.o. female is here because of elevated hemoglobin.  She was found to have abnormal CBC from high hemoglobin.  She was seen back in 2016, transfer of care from prior hematologist and has done extensive work-up.  At the point in time, she was noted to have high testosterone level and sleep apnea that is contributing to high hemoglobin.  JAK2 mutation study was negative and she was discharged from the clinic. She is being referred back again due to persistent elevated hemoglobin and concern for undiagnosed polycythemia vera The patient felt that she have itchy skin, possible splenomegaly and others in with diffuse bone pain.  She felt better if somebody would do a bone marrow biopsy to rule out cancer in her bone. The patient also has diagnosis of fibromyalgia, ankylosing spondylitis and rheumatoid arthritis She is on multiple medications for this She complained of atypical chest pain lasted for several hours on Saturday but that has resolved She complains of frequent abdominal cramping  She never suffer from diagnosis of blood clot.  She does not smoke.  She was diagnosed with obstructive sleep apnea but does not use a CPAP machine 1 unit of blood was removed last month in Feb 2023  I have reviewed the past medical history, past surgical history, social history and family history with the patient and they are unchanged from previous note.  ALLERGIES:  is allergic to duloxetine, ezetimibe, lyrica [pregabalin], milnacipran, rofecoxib, ultram [tramadol hcl], celebrex [celecoxib], codeine, erythromycin, escitalopram oxalate, gluten meal, lactose intolerance (gi),  and statins.  MEDICATIONS:  Current Outpatient Medications  Medication Sig Dispense Refill   Adalimumab (HUMIRA) 40 MG/0.4ML PSKT Inject into the skin every 14 (fourteen) days.     albuterol (PROVENTIL HFA;VENTOLIN HFA) 108 (90 Base) MCG/ACT inhaler Inhale 2 puffs into the lungs every 6 (six) hours as needed for wheezing. For wheezing 3.7 g 11   amLODipine (NORVASC) 5 MG tablet TAKE 1 TABLET(5 MG) BY MOUTH DAILY Strength: 5 mg 90 tablet 3   aspirin EC 81 MG EC tablet Take 1 tablet (81 mg total) by mouth daily. 30 tablet 0   Calcium Carbonate-Vitamin D (CALCIUM + D PO) Take 2 tablets by mouth at bedtime.      cyclobenzaprine (FLEXERIL) 5 MG tablet Take 5 mg by mouth as needed for muscle spasms.     diclofenac sodium (VOLTAREN) 1 % GEL Apply 3 grams to 3 large joints up to 3 times daily. 3 Tube 3   estradiol (VIVELLE-DOT) 0.075 MG/24HR Place 1 patch onto the skin 2 (two) times a week. 24 patch 4   fentaNYL (DURAGESIC - DOSED MCG/HR) 25 MCG/HR Place 1 patch onto the skin every 3 (three) days.     fluticasone (FLONASE) 50 MCG/ACT nasal spray Place 2 sprays into both nostrils as needed. (Patient taking differently: Place 2 sprays into both nostrils as needed for allergies or rhinitis.) 16 g 11   folic acid (FOLVITE) 1 MG tablet Take 1 mg by mouth daily.     furosemide (  LASIX) 20 MG tablet Take 1 tablet (20 mg total) by mouth daily. 90 tablet 3   lansoprazole (PREVACID) 30 MG capsule Take 1 capsule (30 mg total) by mouth daily at 12 noon. 90 capsule 3   lidocaine (LIDODERM) 5 % Place 1 patch onto the skin daily. Remove & Discard patch within 12 hours or as directed by MD 30 patch 1   methotrexate (RHEUMATREX) 2.5 MG tablet Take 10 mg by mouth once a week. Take 4 2.5 mg tablets by mouth weekly. Caution:Chemotherapy. Protect from light.     NONFORMULARY OR COMPOUNDED ITEM Boric Acid Capsules 600 mg. #30 Insert one cap intravaginally twice weekly. 30 each 2   polyethylene glycol powder (MIRALAX) powder  Take 17 g by mouth as needed. For constipation 255 g 0   Semaglutide-Weight Management 0.25 MG/0.5ML SOAJ Inject 0.25 mg into the skin once a week for 28 days. 2 mL 0   [START ON 04/14/2021] Semaglutide-Weight Management 0.5 MG/0.5ML SOAJ Inject 0.5 mg into the skin once a week for 28 days. 2 mL 0   [START ON 05/13/2021] Semaglutide-Weight Management 1 MG/0.5ML SOAJ Inject 1 mg into the skin once a week for 28 days. 2 mL 0   [START ON 06/11/2021] Semaglutide-Weight Management 1.7 MG/0.75ML SOAJ Inject 1.7 mg into the skin once a week for 28 days. 3 mL 0   [START ON 07/10/2021] Semaglutide-Weight Management 2.4 MG/0.75ML SOAJ Inject 2.4 mg into the skin once a week for 28 days. 3 mL 5   thyroid (NP THYROID) 30 MG tablet TAKE 1 TABLET(30 MG) BY MOUTH DAILY BEFORE BREAKFAST 90 tablet 2   topiramate (TOPAMAX) 50 MG tablet TAKE 1 TABLET(50 MG) BY MOUTH TWICE DAILY 60 tablet 0   No current facility-administered medications for this visit.    PHYSICAL EXAMINATION: ECOG PERFORMANCE STATUS: 1 - Symptomatic but completely ambulatory  LABORATORY DATA:  I have reviewed the data as listed CMP Latest Ref Rng & Units 02/23/2021 10/27/2020 04/30/2019  Glucose 70 - 99 mg/dL 100(H) 93 87  BUN 6 - 23 mg/dL $Remove'14 16 21  'CvrJYqw$ Creatinine 0.40 - 1.20 mg/dL 0.93 0.92 1.11  Sodium 135 - 145 mEq/L 141 139 142  Potassium 3.5 - 5.1 mEq/L 4.1 3.5 3.7  Chloride 96 - 112 mEq/L 105 101 103  CO2 19 - 32 mEq/L 34(H) 30 30  Calcium 8.4 - 10.5 mg/dL 9.4 9.6 9.3  Total Protein 6.0 - 8.3 g/dL 7.8 7.6 7.0  Total Bilirubin 0.2 - 1.2 mg/dL 0.4 0.4 0.5  Alkaline Phos 39 - 117 U/L 65 66 69  AST 0 - 37 U/L $Remo'18 17 21  'YaObf$ ALT 0 - 35 U/L $Remo'12 12 13    'PkxCE$ Lab Results  Component Value Date   WBC 8.6 03/15/2021   HGB 15.4 (H) 03/15/2021   HCT 47.0 (H) 03/15/2021   MCV 87.9 03/15/2021   PLT 337 03/15/2021   NEUTROABS 6.3 03/15/2021     I discussed the assessment and treatment plan with the patient. The patient was provided an opportunity to ask  questions and all were answered. The patient agreed with the plan and demonstrated an understanding of the instructions. The patient was advised to call back or seek an in-person evaluation if the symptoms worsen or if the condition fails to improve as anticipated.    I spent 30 minutes for the appointment reviewing test results, discuss management and coordination of care.  Heath Lark, MD 03/31/2021 12:48 PM

## 2021-03-31 NOTE — Assessment & Plan Note (Signed)
She was last seen by pulmonologist around 2015 and was diagnosed with obstructive sleep apnea but the patient has not been using CPAP machine due to poor compliance ?The patient is asking for referral ?I recommend discussion with primary care doctor for referral ?

## 2021-03-31 NOTE — Assessment & Plan Note (Signed)
She is using an inhaler that was prescribed by her primary care doctor ?The patient also insists that she should be evaluated for COPD because she believes lung disease could contribute to her polycythemia ?She was seen by pulmonologist many years ago ?I will reach out to her primary care doctor regarding her concern ?

## 2021-03-31 NOTE — Assessment & Plan Note (Signed)
She was evaluated by cardiologist many years ago with negative work-up ?Today, the patient insists that she have regular pain in her heart ?Again, I suspect could be uncontrolled hypertension ?The patient insists that she should be evaluated ?I will discuss this with her primary care doctor ?

## 2021-04-01 NOTE — Telephone Encounter (Signed)
-----   Message from Biagio Borg, MD sent at 03/31/2021  2:53 PM EST ----- ?Regarding: FW: mutual pt ?Ok to reach out to pt - Dr Alvy Bimler has noticed increased nervousness recently - ok to offer pt a ROV with me if she like.   thanks ?----- Message ----- ?From: Heath Lark, MD ?Sent: 03/31/2021   1:17 PM EST ?To: Biagio Borg, MD ?Subject: RE: mutual pt                                 ? ?Thanks so much for the prompt reply ?----- Message ----- ?From: Biagio Borg, MD ?Sent: 03/31/2021   1:06 PM EST ?To: Heath Lark, MD ?Subject: RE: mutual pt                                 ? ?Yes, she has significant anxiety and depression ongoing for years, though I cant say I have experienced all that you have.  We can try to reach out and have her seen here in the office if you like.   thanks ?----- Message ----- ?From: Heath Lark, MD ?Sent: 03/31/2021  12:52 PM EST ?To: Biagio Borg, MD ?Subject: mutual pt                                     ? ?Hi Dr. Jenny Reichmann, ? ?We have this mutual patient ?I think there is something wrong with her ? Severe anxiety? ?She is requesting lots of referrals and tests  ?I told her I will reach out to you ?My cell number is [phone] ? ?Thanks, ?Ni ? ? ? ? ?

## 2021-04-01 NOTE — Telephone Encounter (Signed)
Left message for patient to call back to schedule ROV ?

## 2021-04-07 NOTE — Progress Notes (Signed)
Faxed x 2 office notes as requested to [facility] at [phone]. Received confirmation. ?

## 2021-04-08 NOTE — Telephone Encounter (Signed)
Patient scheduled appt.

## 2021-04-13 ENCOUNTER — Telehealth: Payer: Self-pay | Admitting: Gastroenterology

## 2021-04-13 NOTE — Telephone Encounter (Signed)
Request received to transfer GI care from outside practice to [facility].  We appreciate the interest in our practice, however at this time due to high demand from patients without established GI providers we cannot accommodate this transfer.  Ability to accommodate future transfer requests may change over time and the patient can contact us again in 6-12 months if still interested in being seen at [facility].      °

## 2021-04-13 NOTE — Telephone Encounter (Signed)
Good morning Dr. Fuller Plan, ? ?This patient is wanting to come back to your care.  Since last seeing you, she has seen a doctor in the [place] area (records in [place]).  The only reason she went there is that her children wanted her to go there to be closer to them.  She is having a lot of abdominal pain and has been told that she "over-makes" her red blood cells.  She prefers to keep all her doctors here in [place].  Please let me know if you approve the transfer. ? ?Thank you. ?

## 2021-04-20 ENCOUNTER — Other Ambulatory Visit: Payer: Medicare PPO

## 2021-04-20 ENCOUNTER — Encounter: Payer: Self-pay | Admitting: Internal Medicine

## 2021-04-20 ENCOUNTER — Ambulatory Visit: Payer: Medicare PPO | Admitting: Internal Medicine

## 2021-04-20 VITALS — BP 130/80 | HR 84 | Resp 18 | Ht 66.0 in | Wt 237.0 lb

## 2021-04-20 DIAGNOSIS — G4733 Obstructive sleep apnea (adult) (pediatric): Secondary | ICD-10-CM | POA: Diagnosis not present

## 2021-04-20 DIAGNOSIS — F32A Depression, unspecified: Secondary | ICD-10-CM

## 2021-04-20 DIAGNOSIS — Z862 Personal history of diseases of the blood and blood-forming organs and certain disorders involving the immune mechanism: Secondary | ICD-10-CM

## 2021-04-20 DIAGNOSIS — R7303 Prediabetes: Secondary | ICD-10-CM

## 2021-04-20 DIAGNOSIS — D573 Sickle-cell trait: Secondary | ICD-10-CM

## 2021-04-20 DIAGNOSIS — D751 Secondary polycythemia: Secondary | ICD-10-CM

## 2021-04-20 DIAGNOSIS — J438 Other emphysema: Secondary | ICD-10-CM | POA: Diagnosis not present

## 2021-04-20 DIAGNOSIS — I10 Essential (primary) hypertension: Secondary | ICD-10-CM | POA: Diagnosis not present

## 2021-04-20 MED ORDER — ONDANSETRON 4 MG PO TBDP: 4.0000 mg | ORAL_TABLET | Freq: Three times a day (TID) | ORAL | 2 refills | Status: AC | PRN

## 2021-04-20 NOTE — Assessment & Plan Note (Signed)
Lab Results  ?Component Value Date  ? HGBA1C 5.6 02/23/2021  ? ?Stable, pt to continue current medical treatment  - diet ? ?

## 2021-04-20 NOTE — Progress Notes (Signed)
Patient ID: [name], female   DOB: [birth year], 71 y.o.   MRN: [id number] ? ? ? ?    Chief Complaint: follow up polycythemia, persistent weakness, fatigue,, dizzy/lightheaded, hemoglobinopathy eval, OSA ? ?     HPI:  [name] is a 71 y.o. female here with c/o generalized weakness, fatigue, dizzy but Pt denies chest pain, increased sob or doe, wheezing, orthopnea, PND, increased LE swelling, palpitations, or syncope. "Just want to feel better".  Was seen per heme with polycythemia, eval essentially neg.  Pt asks for hemoglobinopathy eval,  Has hx of OSA non compliant with CPAP, may have low o2 nocturnal as well.  Started wegovy x 3 wks witih some few lbs wt loss already.   Pt denies polydipsia, polyuria, or new focal neuro s/s.    Pt denies fever, wt loss, night sweats, loss of appetite, or other constitutional symptoms  Non smoker   ?Wt Readings from Last 3 Encounters:  ?04/20/21 237 lb (107.5 kg)  ?03/15/21 242 lb (109.8 kg)  ?02/23/21 238 lb 9.6 oz (108.2 kg)  ? ?BP Readings from Last 3 Encounters:  ?04/20/21 130/80  ?03/15/21 118/83  ?03/15/21 (!) 117/105  ? ?      ?Past Medical History:  ?Diagnosis Date  ? Ankylosis   ? Anxiety   ? Arthritis   ? Asthma   ? Asthma   ? Biliary dyskinesia   ? Bowel obstruction ([place])   ? BRCA negative 02/2013  ? Cervical dysplasia   ? Complication of anesthesia   ? "I have the shakes"  ? Depression   ? Diverticulosis   ? Elevated cholesterol   ? Endometriosis   ? Fibromyalgia   ? GERD (gastroesophageal reflux disease)   ? Heart murmur   ? mvp  ? Hx of oral aphthous ulcers   ? Hypertension   ? Hypertension   ? no med  ? Hypothyroidism   ? hx nodule on thyroid   ? Impaired glucose tolerance 02/25/2011  ? Low back pain   ? Migraine   ? Migraines   ? Nocturia   ? Polycythemia vera ([place])   ? Shingles   ? Shortness of breath   ? with exertion  ? Sickle cell trait ([place])   ? Sleep apnea   ? has not used c-pap x 1 yr. use mouthguard now  ? Thyroid disease   ? Thyroid  disease   ? Hypothyroid  ? ?Past Surgical History:  ?Procedure Laterality Date  ? ABDOMINAL HYSTERECTOMY    ? BREAST EXCISIONAL BIOPSY Left 1999  ? BREAST SURGERY    ? Cystectomy on Left breast  ? BREAST SURGERY    ? Benign breast cyst  ? CHOLECYSTECTOMY    ? CHOLECYSTECTOMY  12/05/2011  ? Procedure: LAPAROSCOPIC CHOLECYSTECTOMY;  Surgeon: Ralene Ok, MD;  Location: [facility];  Service: General;  Laterality: N/A;  ? COLPOSCOPY    ? GYNECOLOGIC CRYOSURGERY    ? HERNIA REPAIR    ? abdomen  ? JOINT REPLACEMENT Right   ? hip  ? OOPHORECTOMY    ? BSO  ? PANNICULECTOMY N/A 06/12/2014  ? Procedure: PANNICULECTOMY;  Surgeon: Irene Limbo, MD;  Location: [place];  Service: Plastics;  Laterality: N/A;  ? PELVIC LAPAROSCOPY  [id number]  ? DL LSO DL RSO  ? TONSILLECTOMY    ? TOTAL HIP ARTHROPLASTY    ? Right  ? VAGINAL HYSTERECTOMY  1983  ? ? reports that she has never smoked. She  has never used smokeless tobacco. She reports that she does not currently use alcohol. She reports that she does not use drugs. ?family history includes Arthritis in her father; Bone cancer in her mother; Breast cancer in her maternal aunt, maternal aunt, maternal grandmother, mother, and paternal grandmother; Breast cancer (age of onset: 14) in her sister; Colon cancer in her sister; Colon cancer (age of onset: 24) in her sister; Diabetes in her father; Heart disease in her father and mother; Heart failure in her father; Hypertension in her father and mother; Multiple sclerosis in her cousin; Ovarian cancer in her paternal grandmother; Sarcoidosis in her brother, sister, and sister. ?Allergies  ?Allergen Reactions  ? Duloxetine Nausea And Vomiting  ? Ezetimibe Nausea And Vomiting  ? Lyrica [Pregabalin] Nausea And Vomiting  ? Milnacipran Nausea And Vomiting  ? Rofecoxib Nausea And Vomiting  ? Ultram [Tramadol Hcl] Nausea And Vomiting  ? Celebrex [Celecoxib] Swelling and Rash  ? Codeine Nausea Only  ? Erythromycin Itching  ?  Escitalopram Oxalate Nausea Only  ? Gluten Meal Other (See Comments)  ?  Intolerant to gluten  ? Lactose Intolerance (Gi) Other (See Comments)  ?  GI upset  ? Statins Other (See Comments)  ?  Body aches  ? ?Current Outpatient Medications on File Prior to Visit  ?Medication Sig Dispense Refill  ? Adalimumab (HUMIRA) 40 MG/0.4ML PSKT Inject into the skin every 14 (fourteen) days.    ? albuterol (PROVENTIL HFA;VENTOLIN HFA) 108 (90 Base) MCG/ACT inhaler Inhale 2 puffs into the lungs every 6 (six) hours as needed for wheezing. For wheezing 3.7 g 11  ? amLODipine (NORVASC) 5 MG tablet TAKE 1 TABLET(5 MG) BY MOUTH DAILY Strength: 5 mg 90 tablet 3  ? aspirin EC 81 MG EC tablet Take 1 tablet (81 mg total) by mouth daily. 30 tablet 0  ? Calcium Carbonate-Vitamin D (CALCIUM + D PO) Take 2 tablets by mouth at bedtime.     ? cyclobenzaprine (FLEXERIL) 5 MG tablet Take 5 mg by mouth as needed for muscle spasms.    ? diclofenac sodium (VOLTAREN) 1 % GEL Apply 3 grams to 3 large joints up to 3 times daily. 3 Tube 3  ? estradiol (VIVELLE-DOT) 0.075 MG/24HR Place 1 patch onto the skin 2 (two) times a week. 24 patch 4  ? fentaNYL (DURAGESIC - DOSED MCG/HR) 25 MCG/HR Place 1 patch onto the skin every 3 (three) days.    ? fluticasone (FLONASE) 50 MCG/ACT nasal spray Place 2 sprays into both nostrils as needed. (Patient taking differently: Place 2 sprays into both nostrils as needed for allergies or rhinitis.) 16 g 11  ? folic acid (FOLVITE) 1 MG tablet Take 1 mg by mouth daily.    ? furosemide (LASIX) 20 MG tablet Take 1 tablet (20 mg total) by mouth daily. 90 tablet 3  ? lansoprazole (PREVACID) 30 MG capsule Take 1 capsule (30 mg total) by mouth daily at 12 noon. 90 capsule 3  ? lidocaine (LIDODERM) 5 % Place 1 patch onto the skin daily. Remove & Discard patch within 12 hours or as directed by MD 30 patch 1  ? methotrexate (RHEUMATREX) 2.5 MG tablet Take 10 mg by mouth once a week. Take 4 2.5 mg tablets by mouth weekly.  Caution:Chemotherapy. Protect from light.    ? NONFORMULARY OR COMPOUNDED ITEM Boric Acid Capsules 600 mg. #30 ?Insert one cap intravaginally twice weekly. 30 each 2  ? polyethylene glycol powder (MIRALAX) powder Take 17 g by mouth  as needed. For constipation 255 g 0  ? Semaglutide-Weight Management 0.5 MG/0.5ML SOAJ Inject 0.5 mg into the skin once a week for 28 days. 2 mL 0  ? [START ON 05/13/2021] Semaglutide-Weight Management 1 MG/0.5ML SOAJ Inject 1 mg into the skin once a week for 28 days. 2 mL 0  ? [START ON 06/11/2021] Semaglutide-Weight Management 1.7 MG/0.75ML SOAJ Inject 1.7 mg into the skin once a week for 28 days. 3 mL 0  ? [START ON 07/10/2021] Semaglutide-Weight Management 2.4 MG/0.75ML SOAJ Inject 2.4 mg into the skin once a week for 28 days. 3 mL 5  ? thyroid (NP THYROID) 30 MG tablet TAKE 1 TABLET(30 MG) BY MOUTH DAILY BEFORE BREAKFAST 90 tablet 2  ? topiramate (TOPAMAX) 50 MG tablet TAKE 1 TABLET(50 MG) BY MOUTH TWICE DAILY 60 tablet 0  ? ?No current facility-administered medications on file prior to visit.  ? ?     ROS:  All others reviewed and negative. ? ?Objective  ? ?     PE:  BP 130/80   Pulse 84   Resp 18   Ht $R'5\' 6"'Mn$  (1.676 m)   Wt 237 lb (107.5 kg)   SpO2 90%   BMI 38.25 kg/m?  ? ?              Constitutional: Pt appears in NAD ?              HENT: Head: NCAT.  ?              Right Ear: External ear normal.   ?              Left Ear: External ear normal.  ?              Eyes: . Pupils are equal, round, and reactive to light. Conjunctivae and EOM are normal ?              Nose: without d/c or deformity ?              Neck: Neck supple. Gross normal ROM ?              Cardiovascular: Normal rate and regular rhythm.   ?              Pulmonary/Chest: Effort normal and breath sounds without rales or wheezing.  ?              Abd:  Soft, NT, ND, + BS, no organomegaly ?              Neurological: Pt is alert. At baseline orientation, motor grossly intact ?              Skin: Skin is warm. No  rashes, no other new lesions, LE edema - none ?              Psychiatric: Pt behavior is normal without agitation  ? ?Micro: none ? ?Cardiac tracings I have personally interpreted today:  none ? ?Pertinent Radiological findings

## 2021-04-20 NOTE — Assessment & Plan Note (Signed)
Persistent, I suspect related to her symptoms, for pulm referral ?

## 2021-04-20 NOTE — Assessment & Plan Note (Signed)
Also for hemoglobinopathy evaluation ?

## 2021-04-20 NOTE — Assessment & Plan Note (Signed)
Severity unclear, For PFTs, and ONO per home health,  to f/u any worsening symptoms or concerns ? ?

## 2021-04-20 NOTE — Patient Instructions (Addendum)
Please continue all other medications as before, and refills have been done if requested. ? ?Please have the pharmacy call with any other refills you may need. ? ?Please continue your efforts at being more active, low cholesterol diet, and weight control ? ?Please keep your appointments with your specialists as you may have planned ? ?Please go to the LAB at the blood drawing area for the tests to be done - the hemoglobin testing at [facility] today ? ?You will be contacted regarding the referral for: pulmonary for sleep apnea ? ?You will be contacted regarding the referral for: Home Health for the ONO (overnight oximetry) to check for low oxygen ? ?You will be contacted regarding the referral for: PFTs (lung testing with the pulmonary doctors) ? ?Please make an Appointment to return in 6 months, or sooner if needed ?

## 2021-04-20 NOTE — Assessment & Plan Note (Signed)
Chronic refractory, declines tx, high irritable, tends to fire her providers then tries to return for care ?

## 2021-04-20 NOTE — Assessment & Plan Note (Signed)
BP Readings from Last 3 Encounters:  ?04/20/21 130/80  ?03/15/21 118/83  ?03/15/21 (!) 117/105  ? ?Stable, pt to continue medical treatment norvasc ? ?

## 2021-04-22 LAB — HEMOGLOBINOPATHY EVALUATION
Fetal Hemoglobin Testing: <1 % (ref 0.0–1.9)
HCT: 46.9 % — ABNORMAL HIGH (ref 35.0–45.0)
Hemoglobin A2 - HGBRFX: 3.1 % (ref 2.2–3.2)
Hemoglobin: 15.5 g/dL (ref 11.7–15.5)
Hgb A: 62.3 % — ABNORMAL LOW (ref >96.0)
Hgb S Quant: 34.6 % — ABNORMAL HIGH
MCH: 29.4 pg (ref 27.0–33.0)
MCV: 89 fL (ref 80.0–100.0)
RBC: 5.27 10*6/uL — ABNORMAL HIGH (ref 3.80–5.10)
RDW: 14.9 % (ref 11.0–15.0)

## 2021-05-04 DIAGNOSIS — K589 Irritable bowel syndrome without diarrhea: Secondary | ICD-10-CM | POA: Diagnosis not present

## 2021-05-04 DIAGNOSIS — Z6841 Body Mass Index (BMI) 40.0 and over, adult: Secondary | ICD-10-CM | POA: Diagnosis not present

## 2021-05-04 DIAGNOSIS — E039 Hypothyroidism, unspecified: Secondary | ICD-10-CM | POA: Diagnosis not present

## 2021-05-04 DIAGNOSIS — D45 Polycythemia vera: Secondary | ICD-10-CM | POA: Diagnosis not present

## 2021-05-04 DIAGNOSIS — G8929 Other chronic pain: Secondary | ICD-10-CM | POA: Diagnosis not present

## 2021-05-04 DIAGNOSIS — M797 Fibromyalgia: Secondary | ICD-10-CM | POA: Diagnosis not present

## 2021-05-04 DIAGNOSIS — M545 Low back pain, unspecified: Secondary | ICD-10-CM | POA: Diagnosis not present

## 2021-05-04 DIAGNOSIS — E782 Mixed hyperlipidemia: Secondary | ICD-10-CM | POA: Diagnosis not present

## 2021-05-23 ENCOUNTER — Institutional Professional Consult (permissible substitution): Payer: Medicare PPO | Admitting: Internal Medicine

## 2021-06-02 ENCOUNTER — Encounter: Payer: Self-pay | Admitting: Pulmonary Disease

## 2021-06-02 ENCOUNTER — Ambulatory Visit (INDEPENDENT_AMBULATORY_CARE_PROVIDER_SITE_OTHER): Payer: Medicare PPO | Admitting: Pulmonary Disease

## 2021-06-02 ENCOUNTER — Ambulatory Visit (INDEPENDENT_AMBULATORY_CARE_PROVIDER_SITE_OTHER): Payer: Medicare PPO

## 2021-06-02 VITALS — BP 136/92 | HR 122 | Temp 98.0°F | Ht 64.0 in | Wt 231.0 lb

## 2021-06-02 DIAGNOSIS — R0609 Other forms of dyspnea: Secondary | ICD-10-CM

## 2021-06-02 DIAGNOSIS — G4733 Obstructive sleep apnea (adult) (pediatric): Secondary | ICD-10-CM | POA: Diagnosis not present

## 2021-06-02 DIAGNOSIS — R0600 Dyspnea, unspecified: Secondary | ICD-10-CM | POA: Diagnosis not present

## 2021-06-02 MED ORDER — FLUTICASONE-SALMETEROL 115-21 MCG/ACT IN AERO: 2.0000 | INHALATION_SPRAY | Freq: Two times a day (BID) | RESPIRATORY_TRACT | 12 refills | Status: AC

## 2021-06-02 NOTE — Patient Instructions (Signed)
Chest xray today ? ?Will arrange for pulmonary function test, echocardiogram, and home sleep study ? ?Follow up in 6 to 8 weeks ?

## 2021-06-02 NOTE — Progress Notes (Signed)
? ?[facility] ? ?Chief Complaint  ?Patient presents with  ? Consult  ?  Has had OSA for 20 years but has not used CPAP.  ?Was using a mouth guard but has stopped recently because its uncomfortable.   ? ? ?Past Surgical History:  ?She  has a past surgical history that includes Abdominal hysterectomy; Hernia repair; Breast surgery; Cholecystectomy; Tonsillectomy; Pelvic laparoscopy ([id number]); Vaginal hysterectomy (1983); Breast surgery; Cholecystectomy (12/05/2011); Colposcopy; Gynecologic cryosurgery; Oophorectomy; Panniculectomy (N/A, 06/12/2014); Total hip arthroplasty; Breast excisional biopsy (Left, 1999); and Joint replacement (Right). ? ?Past Medical History:  ?Ankylosis, Anxiety, Arthritis, Depression, Diverticulosis, HLD, Endometriosis, Fibromyalgia, GERD, HTN, Hypothyroidism, Migraine headaches, Sickle cell trait ? ?Constitutional:  ?BP (!) 136/92 (BP Location: Left Wrist, Patient Position: Sitting)   Pulse (!) 122   Temp 98 ?F (36.7 ?C) (Oral)   Ht '5\' 4"'$  (1.626 m)   Wt 231 lb (104.8 kg)   SpO2 97% Comment: ra  BMI 39.65 kg/m?  ? ?Brief Summary:  ?[name] is a 71 y.o. female with dyspnea and obstructive sleep apnea. ?  ? ? ? ?Subjective:  ? ?She was seen by Dr. Lenna Gilford and Dr. Gwenette Greet previously.  She has sleep study in 2006 that showed moderate sleep apnea with an AHI of 23.  She was tried on CPAP, but had trouble tolerating this.  She was then seen by Dr. Oneal Grout and got an oral appliance.  She had several other health issues and wasn't able to wear her oral appliance for some time.  When she tried using it again, it felt too tight.  She moved to [place] to be closer to family.  She tried switching health care providers to that area, but found it to be more comfortable to see her providers in [place].   ? ?She has history of polycythemia.  Concern is this is related to hypoxia.  She never smoked, but her father did.  She has been treated for  asthma since she childhood.  She uses advair and albuterol, and these help.  She gets winded with minimal activity.  She gets swelling in her ankles.  She is followed by rheumatology for RA and AS, and is on MTX and humira.  She gets cough with clear sputum.  Sinus okay, no hemoptysis. ? ?Physical Exam:  ? ?Appearance - well kempt  ? ?ENMT - no sinus tenderness, no oral exudate, no LAN, Mallampati 2 airway, no stridor, triangular uvula, decreased AP diameter ? ?Respiratory - equal breath sounds bilaterally, no wheezing or rales ? ?CV - s1s2 regular rate and rhythm, no murmurs ? ?Ext - no clubbing, no edema ? ?Skin - no rashes ? ?Psych - normal mood and affect ?  ?Pulmonary testing:  ? ? ?Chest Imaging:  ? ? ?Sleep Tests:  ? ? ?Cardiac Tests:  ? ? ?Social History:  ?She  reports that she has never smoked. She has never used smokeless tobacco. She reports that she does not currently use alcohol. She reports that she does not use drugs. ? ?Family History:  ?Her family history includes Arthritis in her father; Bone cancer in her mother; Breast cancer in her maternal aunt, maternal aunt, maternal grandmother, mother, and paternal grandmother; Breast cancer (age of onset: 20) in her sister; Colon cancer in her sister; Colon cancer (age of onset: 35) in her sister; Diabetes in her father; Heart disease in her father and mother; Heart failure in her father; Hypertension in her father and mother;  Multiple sclerosis in her cousin; Ovarian cancer in her paternal grandmother; Sarcoidosis in her brother, sister, and sister. ?  ? ? ?Assessment/Plan:  ? ?Snoring with excessive daytime sleepiness. ?- will need to arrange for a home sleep study ? ?Dyspnea on exertion. ?- likely multifactorial: asthma with ?COPD, obesity with deconditioning, possible diastolic dysfunction ?- continue advair with prn albuterol ?- will arrange for chest xray, pulmonary function test and echocardiogram ? ?Secondary polycythemia. ?- followed by Dr. Heath Lark with hematology ?- might need to consider nocturnal oxygen if sleep assessment unrevealing, or she is unable to tolerate therapies for sleep apnea ? ?Ankylosing spondylitis, Rheumatoid arthritis, Fibromyalgia. ?- followed by Dr. Lahoma Rocker with rheumatology ? ?Obesity. ?- discussed how weight can impact sleep and risk for sleep disordered breathing ?- discussed options to assist with weight loss: combination of diet modification, cardiovascular and strength training exercises ? ?Cardiovascular risk. ?- had an extensive discussion regarding the adverse health consequences related to untreated sleep disordered breathing ?- specifically discussed the risks for hypertension, coronary artery disease, cardiac dysrhythmias, cerebrovascular disease, and diabetes ?- lifestyle modification discussed ? ?Safe driving practices. ?- discussed how sleep disruption can increase risk of accidents, particularly when driving ?- safe driving practices were discussed ? ?Therapies for obstructive sleep apnea. ?- if the sleep study shows significant sleep apnea, then various therapies for treatment were reviewed: CPAP, oral appliance, and surgical interventions ? ?Time Spent Involved in Patient Care on Day of Examination:  ?51 minutes ? ?Follow up:  ? ?Patient Instructions  ?Chest xray today ? ?Will arrange for pulmonary function test, echocardiogram, and home sleep study ? ?Follow up in 6 to 8 weeks ? ?Medication List:  ? ?Allergies as of 06/02/2021   ? ?   Reactions  ? Duloxetine Nausea And Vomiting  ? Ezetimibe Nausea And Vomiting  ? Lyrica [pregabalin] Nausea And Vomiting  ? Milnacipran Nausea And Vomiting  ? Rofecoxib Nausea And Vomiting  ? Ultram [tramadol Hcl] Nausea And Vomiting  ? Celebrex [celecoxib] Swelling, Rash  ? Codeine Nausea Only  ? Erythromycin Itching  ? Escitalopram Oxalate Nausea Only  ? Gluten Meal Other (See Comments)  ? Intolerant to gluten  ? Lactose Intolerance (gi) Other (See Comments)  ? GI upset  ?  Statins Other (See Comments)  ? Body aches  ? ?  ? ?  ?Medication List  ?  ? ?  ? Accurate as of Jun 02, 2021  3:58 PM. If you have any questions, ask your nurse or doctor.  ?  ?  ? ?  ? ?albuterol 108 (90 Base) MCG/ACT inhaler ?Commonly known as: VENTOLIN HFA ?Inhale 2 puffs into the lungs every 6 (six) hours as needed for wheezing. For wheezing ?  ?amLODipine 5 MG tablet ?Commonly known as: NORVASC ?TAKE 1 TABLET(5 MG) BY MOUTH DAILY Strength: 5 mg ?  ?aspirin 81 MG EC tablet ?Take 1 tablet (81 mg total) by mouth daily. ?  ?CALCIUM + D PO ?Take 2 tablets by mouth at bedtime. ?  ?cyclobenzaprine 5 MG tablet ?Commonly known as: FLEXERIL ?Take 5 mg by mouth as needed for muscle spasms. ?  ?diclofenac sodium 1 % Gel ?Commonly known as: VOLTAREN ?Apply 3 grams to 3 large joints up to 3 times daily. ?  ?estradiol 0.075 MG/24HR ?Commonly known as: VIVELLE-DOT ?Place 1 patch onto the skin 2 (two) times a week. ?  ?fentaNYL 25 MCG/HR ?Commonly known as: [place] ?Place 1 patch onto the skin every 3 (three) days. ?  ?  fluticasone 50 MCG/ACT nasal spray ?Commonly known as: FLONASE ?Place 2 sprays into both nostrils as needed. ?What changed: reasons to take this ?  ?fluticasone-salmeterol 115-21 MCG/ACT inhaler ?Commonly known as: Advair HFA ?Inhale 2 puffs into the lungs 2 (two) times daily. ?Started by: Chesley Mires, MD ?  ?folic acid 1 MG tablet ?Commonly known as: FOLVITE ?Take 1 mg by mouth daily. ?  ?furosemide 20 MG tablet ?Commonly known as: LASIX ?Take 1 tablet (20 mg total) by mouth daily. ?  ?Humira 40 MG/0.4ML Pskt ?Generic drug: Adalimumab ?Inject into the skin every 14 (fourteen) days. ?  ?lansoprazole 30 MG capsule ?Commonly known as: Prevacid ?Take 1 capsule (30 mg total) by mouth daily at 12 noon. ?  ?lidocaine 5 % ?Commonly known as: Lidoderm ?Place 1 patch onto the skin daily. Remove & Discard patch within 12 hours or as directed by MD ?  ?methotrexate 2.5 MG tablet ?Commonly known as: RHEUMATREX ?Take 10 mg  by mouth once a week. Take 4 2.5 mg tablets by mouth weekly. Caution:Chemotherapy. Protect from light. ?  ?NONFORMULARY OR COMPOUNDED ITEM ?Boric Acid Capsules 600 mg. #30 ?Insert one cap intravaginally twice

## 2021-06-06 DIAGNOSIS — M545 Low back pain, unspecified: Secondary | ICD-10-CM | POA: Diagnosis not present

## 2021-06-06 DIAGNOSIS — G8929 Other chronic pain: Secondary | ICD-10-CM | POA: Diagnosis not present

## 2021-06-06 DIAGNOSIS — M456 Ankylosing spondylitis lumbar region: Secondary | ICD-10-CM | POA: Diagnosis not present

## 2021-07-28 ENCOUNTER — Encounter: Payer: Self-pay | Admitting: Pulmonary Disease

## 2021-07-28 ENCOUNTER — Telehealth: Payer: Self-pay | Admitting: Internal Medicine

## 2021-07-28 ENCOUNTER — Ambulatory Visit: Payer: Medicare PPO | Admitting: Pulmonary Disease

## 2021-07-28 ENCOUNTER — Ambulatory Visit (INDEPENDENT_AMBULATORY_CARE_PROVIDER_SITE_OTHER): Payer: Medicare PPO | Admitting: Pulmonary Disease

## 2021-07-28 VITALS — BP 124/86 | HR 98 | Temp 98.0°F | Ht 64.0 in | Wt 235.6 lb

## 2021-07-28 DIAGNOSIS — G4733 Obstructive sleep apnea (adult) (pediatric): Secondary | ICD-10-CM | POA: Diagnosis not present

## 2021-07-28 DIAGNOSIS — R0609 Other forms of dyspnea: Secondary | ICD-10-CM

## 2021-07-28 DIAGNOSIS — J4540 Moderate persistent asthma, uncomplicated: Secondary | ICD-10-CM

## 2021-07-28 LAB — PULMONARY FUNCTION TEST
DL/VA % pred: 109 %
DL/VA: 4.53 ml/min/mmHg/L
DLCO cor % pred: 95 %
DLCO cor: 18.66 ml/min/mmHg
DLCO unc % pred: 95 %
DLCO unc: 18.66 ml/min/mmHg
FEF 25-75 Post: 1.43 L/sec
FEF 25-75 Pre: 1.33 L/sec
FEF2575-%Change-Post: 7 %
FEF2575-%Pred-Post: 71 %
FEF2575-%Pred-Pre: 66 %
FEV1-%Change-Post: 1 %
FEV1-%Pred-Post: 72 %
FEV1-%Pred-Pre: 71 %
FEV1-Post: 1.68 L
FEV1-Pre: 1.66 L
FEV1FVC-%Change-Post: 5 %
FEV1FVC-%Pred-Pre: 100 %
FEV6-%Change-Post: -4 %
FEV6-%Pred-Post: 70 %
FEV6-%Pred-Pre: 73 %
FEV6-Post: 2.08 L
FEV6-Pre: 2.17 L
FEV6FVC-%Pred-Post: 104 %
FEV6FVC-%Pred-Pre: 104 %
FVC-%Change-Post: -4 %
FVC-%Pred-Post: 67 %
FVC-%Pred-Pre: 70 %
FVC-Post: 2.08 L
FVC-Pre: 2.17 L
Post FEV1/FVC ratio: 81 %
Post FEV6/FVC ratio: 100 %
Pre FEV1/FVC ratio: 77 %
Pre FEV6/FVC Ratio: 100 %
RV % pred: 63 %
RV: 1.36 L
TLC % pred: 78 %
TLC: 3.97 L

## 2021-07-28 NOTE — Telephone Encounter (Signed)
Pt called and would like to know if she can get an rx for Semaglutide,0.25 or 0.'5MG'$ /DOS, (OZEMPIC, 0.25 OR 0.5 MG/DOSE,) 2 MG/1.5ML SOPN again. Pt stated she was on it previously but her pharmacy was out of stock so she switched to wegovy. Pt stated Katherine Roan was making her nauseous. She stated she stopped Wegovy and would like to switch back to Ozempic if possible.   Please advise.

## 2021-07-28 NOTE — Progress Notes (Signed)
Full PFT Performed Today  

## 2021-07-28 NOTE — Patient Instructions (Signed)
Full PFT Performed Today  

## 2021-07-28 NOTE — Patient Instructions (Signed)
Will make sure your Echocardiogram and home sleep study get set up and call to schedule a follow up appointment after these are done

## 2021-07-28 NOTE — Progress Notes (Signed)
[facility]  Chief Complaint  Patient presents with   Follow-up    Follow up on PFT. No echocardiogram done. No sleep study.    Past Surgical History:  She  has a past surgical history that includes Abdominal hysterectomy; Hernia repair; Breast surgery; Cholecystectomy; Tonsillectomy; Pelvic laparoscopy (3976,7341); Vaginal hysterectomy (1983); Breast surgery; Cholecystectomy (12/05/2011); Colposcopy; Gynecologic cryosurgery; Oophorectomy; Panniculectomy (N/A, 06/12/2014); Total hip arthroplasty; Breast excisional biopsy (Left, 1999); and Joint replacement (Right).  Past Medical History:  Ankylosis, Anxiety, Arthritis, Depression, Diverticulosis, HLD, Endometriosis, Fibromyalgia, GERD, HTN, Hypothyroidism, Migraine headaches, Sickle cell trait  Constitutional:  BP 124/86 (BP Location: Right Arm, Patient Position: Sitting, Cuff Size: Large)   Pulse 98   Temp 98 F (36.7 C) (Oral)   Ht '5\' 4"'$  (1.626 m)   Wt 235 lb 9.6 oz (106.9 kg)   SpO2 97%   BMI 40.44 kg/m   Brief Summary:  [name] is a 71 y.o. female with dyspnea and obstructive sleep apnea.      Subjective:   Chest xray from 06/05/21 was normal.  She hasn't completed Echo or home sleep study yet.  PFT today showed mild restriction, but otherwise normal.  She gets winded when walking too much.  She then gets coughing spells.  Albuterol helps with this.  She hasn't needed to use albuterol as much since she has been on advair.  She wakes up at night feeling short of breath and coughing.    Her legs feel heavy and her feet hurt.  She tried wegovy, but this made her feel nauseous.  She will check with her PCP about switching to ozempic.  Physical Exam:   Appearance - well kempt   ENMT - no sinus tenderness, no oral exudate, no LAN, Mallampati 2 airway, no stridor, triangular uvula, decreased AP diameter  Respiratory - equal breath sounds bilaterally, no  wheezing or rales  CV - s1s2 regular rate and rhythm, no murmurs  Ext - no clubbing, no edema  Skin - no rashes  Psych - normal mood and affect   Pulmonary testing:  PFT 07/28/21 >> FEV1 1.68 (72%), FEV1% 81, TLC 3.97 (78%), DLCO 95%  Sleep Tests:    Cardiac Tests:    Social History:  She  reports that she has never smoked. She has been exposed to tobacco smoke. She has never used smokeless tobacco. She reports that she does not currently use alcohol. She reports that she does not use drugs.  Family History:  Her family history includes Arthritis in her father; Bone cancer in her mother; Breast cancer in her maternal aunt, maternal aunt, maternal grandmother, mother, and paternal grandmother; Breast cancer (age of onset: 23) in her sister; Colon cancer in her sister; Colon cancer (age of onset: 64) in her sister; Diabetes in her father; Heart disease in her father and mother; Heart failure in her father; Hypertension in her father and mother; Multiple sclerosis in her cousin; Ovarian cancer in her paternal grandmother; Sarcoidosis in her brother, sister, and sister.     Assessment/Plan:   Snoring with excessive daytime sleepiness. - will make sure her home sleep study gets scheduled  Dyspnea on exertion. - from asthma, obesity with deconditioning and possible diastolic CHF - continue advair - prn albuterol - will make sure her Echo gets scheduled and then determine if she needs additional cardiac assessment  Secondary polycythemia. - followed by Dr. Heath Lark with hematology - might need to consider nocturnal oxygen  if sleep assessment unrevealing, or she is unable to tolerate therapies for sleep apnea  Ankylosing spondylitis, Rheumatoid arthritis, Fibromyalgia. - followed by Dr. Lahoma Rocker with rheumatology  Obesity. - discussed how weight can impact sleep and risk for sleep disordered breathing - discussed options to assist with weight loss: combination of diet  modification, cardiovascular and strength training exercises  Time Spent Involved in Patient Care on Day of Examination:  37 minutes  Follow up:   Patient Instructions  Will make sure your Echocardiogram and home sleep study get set up and call to schedule a follow up appointment after these are done   Medication List:   Allergies as of 07/28/2021       Reactions   Duloxetine Nausea And Vomiting   Ezetimibe Nausea And Vomiting   Lyrica [pregabalin] Nausea And Vomiting   Milnacipran Nausea And Vomiting   Rofecoxib Nausea And Vomiting   Ultram [tramadol Hcl] Nausea And Vomiting   Celebrex [celecoxib] Swelling, Rash   Codeine Nausea Only   Erythromycin Itching   Escitalopram Oxalate Nausea Only   Gluten Meal Other (See Comments)   Intolerant to gluten   Lactose Intolerance (gi) Other (See Comments)   GI upset   Statins Other (See Comments)   Body aches        Medication List        Accurate as of July 28, 2021  4:22 PM. If you have any questions, ask your nurse or doctor.          albuterol 108 (90 Base) MCG/ACT inhaler Commonly known as: VENTOLIN HFA Inhale 2 puffs into the lungs every 6 (six) hours as needed for wheezing. For wheezing   amLODipine 5 MG tablet Commonly known as: NORVASC TAKE 1 TABLET(5 MG) BY MOUTH DAILY Strength: 5 mg   aspirin EC 81 MG tablet Take 1 tablet (81 mg total) by mouth daily.   CALCIUM + D PO Take 2 tablets by mouth at bedtime.   cyclobenzaprine 5 MG tablet Commonly known as: FLEXERIL Take 5 mg by mouth as needed for muscle spasms.   diclofenac sodium 1 % Gel Commonly known as: VOLTAREN Apply 3 grams to 3 large joints up to 3 times daily.   estradiol 0.075 MG/24HR Commonly known as: VIVELLE-DOT Place 1 patch onto the skin 2 (two) times a week.   fentaNYL 25 MCG/HR Commonly known as: Shirley 1 patch onto the skin every 3 (three) days.   fluticasone 50 MCG/ACT nasal spray Commonly known as: FLONASE Place 2  sprays into both nostrils as needed. What changed: reasons to take this   fluticasone-salmeterol 115-21 MCG/ACT inhaler Commonly known as: Advair HFA Inhale 2 puffs into the lungs 2 (two) times daily.   folic acid 1 MG tablet Commonly known as: FOLVITE Take 1 mg by mouth daily.   furosemide 20 MG tablet Commonly known as: LASIX Take 1 tablet (20 mg total) by mouth daily.   Humira 40 MG/0.4ML Pskt Generic drug: Adalimumab Inject into the skin every 14 (fourteen) days.   lansoprazole 30 MG capsule Commonly known as: Prevacid Take 1 capsule (30 mg total) by mouth daily at 12 noon.   lidocaine 5 % Commonly known as: Lidoderm Place 1 patch onto the skin daily. Remove & Discard patch within 12 hours or as directed by MD   methotrexate 2.5 MG tablet Commonly known as: RHEUMATREX Take 10 mg by mouth once a week. Take 4 2.5 mg tablets by mouth weekly. Caution:Chemotherapy. Protect from light.  NONFORMULARY OR COMPOUNDED ITEM Boric Acid Capsules 600 mg. #30 Insert one cap intravaginally twice weekly.   ondansetron 4 MG disintegrating tablet Commonly known as: ZOFRAN-ODT Take 1 tablet (4 mg total) by mouth every 8 (eight) hours as needed for nausea or vomiting.   polyethylene glycol powder 17 GM/SCOOP powder Commonly known as: MiraLax Take 17 g by mouth as needed. For constipation   Semaglutide-Weight Management 2.4 MG/0.75ML Soaj Inject 2.4 mg into the skin once a week for 28 days.   thyroid 30 MG tablet Commonly known as: NP Thyroid TAKE 1 TABLET(30 MG) BY MOUTH DAILY BEFORE BREAKFAST   topiramate 50 MG tablet Commonly known as: TOPAMAX TAKE 1 TABLET(50 MG) BY MOUTH TWICE DAILY        Signature:  Chesley Mires, MD [place] Pager - [phone] 07/28/2021, 4:22 PM

## 2021-07-29 MED ORDER — OZEMPIC (0.25 OR 0.5 MG/DOSE) 2 MG/3ML ~~LOC~~ SOPN: PEN_INJECTOR | SUBCUTANEOUS | 3 refills | Status: DC

## 2021-07-29 NOTE — Telephone Encounter (Signed)
Patient notified via voicemail.

## 2021-07-29 NOTE — Telephone Encounter (Signed)
Ok done erx 

## 2021-08-09 ENCOUNTER — Encounter: Payer: Self-pay | Admitting: Pulmonary Disease

## 2021-09-01 ENCOUNTER — Telehealth: Payer: Self-pay | Admitting: Internal Medicine

## 2021-09-01 NOTE — Telephone Encounter (Signed)
Patient is having trouble with the ozempic - she is going to discontinue this medication - is having severe diarhhea and cramping.  She no long want to be on the ozempic.  Patient has something for nausea but wants to know if there is anything he can prescribe for the cramping.

## 2021-09-02 MED ORDER — DICYCLOMINE HCL 10 MG PO CAPS
10.0000 mg | ORAL_CAPSULE | Freq: Three times a day (TID) | ORAL | 1 refills | Status: DC
Start: 2021-09-02 — End: 2021-11-25

## 2021-09-02 NOTE — Telephone Encounter (Signed)
Ok we understand about the ozempic  Ok for dicyclomiine prn - done erx

## 2021-09-08 DIAGNOSIS — R768 Other specified abnormal immunological findings in serum: Secondary | ICD-10-CM | POA: Diagnosis not present

## 2021-09-08 DIAGNOSIS — M0609 Rheumatoid arthritis without rheumatoid factor, multiple sites: Secondary | ICD-10-CM | POA: Diagnosis not present

## 2021-09-08 DIAGNOSIS — M2550 Pain in unspecified joint: Secondary | ICD-10-CM | POA: Diagnosis not present

## 2021-09-08 DIAGNOSIS — Z79899 Other long term (current) drug therapy: Secondary | ICD-10-CM | POA: Diagnosis not present

## 2021-10-18 ENCOUNTER — Encounter: Payer: Self-pay | Admitting: Pulmonary Disease

## 2021-11-11 ENCOUNTER — Ambulatory Visit: Payer: Self-pay | Admitting: Licensed Clinical Social Worker

## 2021-11-11 NOTE — Patient Outreach (Signed)
  Care Coordination   11/11/2021 Name: [name] MRN: [id number] DOB: 1950-04-08   Care Coordination Outreach Attempts:  An unsuccessful telephone outreach was attempted today to offer the patient information about available care coordination services as a benefit of their health plan.   Follow Up Plan:  Additional outreach attempts will be made to offer the patient care coordination information and services.   Encounter Outcome:  No Answer  Care Coordination Interventions Activated:  No   Care Coordination Interventions:  No, not indicated    Milus Height, BSW , MSW, [place] Social Worker [facility] Care Management  [phone]

## 2021-11-12 DIAGNOSIS — J45909 Unspecified asthma, uncomplicated: Secondary | ICD-10-CM | POA: Diagnosis not present

## 2021-11-12 DIAGNOSIS — R1032 Left lower quadrant pain: Secondary | ICD-10-CM | POA: Diagnosis not present

## 2021-11-12 DIAGNOSIS — R1031 Right lower quadrant pain: Secondary | ICD-10-CM | POA: Diagnosis not present

## 2021-11-12 DIAGNOSIS — K760 Fatty (change of) liver, not elsewhere classified: Secondary | ICD-10-CM | POA: Diagnosis not present

## 2021-11-12 DIAGNOSIS — D573 Sickle-cell trait: Secondary | ICD-10-CM | POA: Diagnosis not present

## 2021-11-12 DIAGNOSIS — D45 Polycythemia vera: Secondary | ICD-10-CM | POA: Diagnosis not present

## 2021-11-12 DIAGNOSIS — R197 Diarrhea, unspecified: Secondary | ICD-10-CM | POA: Diagnosis not present

## 2021-11-12 DIAGNOSIS — Z8719 Personal history of other diseases of the digestive system: Secondary | ICD-10-CM | POA: Diagnosis not present

## 2021-11-12 DIAGNOSIS — E7800 Pure hypercholesterolemia, unspecified: Secondary | ICD-10-CM | POA: Diagnosis not present

## 2021-11-12 DIAGNOSIS — M79604 Pain in right leg: Secondary | ICD-10-CM | POA: Diagnosis not present

## 2021-11-12 DIAGNOSIS — R1030 Lower abdominal pain, unspecified: Secondary | ICD-10-CM | POA: Diagnosis not present

## 2021-11-12 DIAGNOSIS — E079 Disorder of thyroid, unspecified: Secondary | ICD-10-CM | POA: Diagnosis not present

## 2021-11-12 DIAGNOSIS — R079 Chest pain, unspecified: Secondary | ICD-10-CM | POA: Diagnosis not present

## 2021-11-25 ENCOUNTER — Other Ambulatory Visit: Payer: Self-pay | Admitting: Internal Medicine

## 2021-11-30 ENCOUNTER — Ambulatory Visit: Payer: Medicare PPO | Admitting: Obstetrics & Gynecology

## 2021-11-30 ENCOUNTER — Encounter: Payer: Self-pay | Admitting: Obstetrics & Gynecology

## 2021-11-30 VITALS — BP 118/78 | HR 97

## 2021-11-30 DIAGNOSIS — Z79890 Hormone replacement therapy (postmenopausal): Secondary | ICD-10-CM

## 2021-11-30 DIAGNOSIS — R350 Frequency of micturition: Secondary | ICD-10-CM | POA: Diagnosis not present

## 2021-11-30 DIAGNOSIS — N9489 Other specified conditions associated with female genital organs and menstrual cycle: Secondary | ICD-10-CM | POA: Diagnosis not present

## 2021-11-30 DIAGNOSIS — Z90710 Acquired absence of both cervix and uterus: Secondary | ICD-10-CM

## 2021-11-30 DIAGNOSIS — Z9079 Acquired absence of other genital organ(s): Secondary | ICD-10-CM

## 2021-11-30 LAB — URINALYSIS, COMPLETE W/RFL CULTURE
Bacteria, UA: NONE SEEN /HPF
Bilirubin Urine: NEGATIVE
Casts: NONE SEEN /LPF
Crystals: NONE SEEN /HPF
Glucose, UA: NEGATIVE
Hgb urine dipstick: NEGATIVE
Hyaline Cast: NONE SEEN /LPF
Leukocyte Esterase: NEGATIVE
Nitrites, Initial: NEGATIVE
Protein, ur: NEGATIVE
RBC / HPF: NONE SEEN /HPF (ref 0–2)
Specific Gravity, Urine: 1.015 (ref 1.001–1.035)
WBC, UA: NONE SEEN /HPF (ref 0–5)
Yeast: NONE SEEN /HPF
pH: 5.5 (ref 5.0–8.0)

## 2021-11-30 LAB — WET PREP FOR TRICH, YEAST, CLUE

## 2021-11-30 LAB — NO CULTURE INDICATED

## 2021-11-30 MED ORDER — FLUCONAZOLE 150 MG PO TABS: 150.0000 mg | ORAL_TABLET | ORAL | 1 refills | Status: AC

## 2021-11-30 MED ORDER — ESTRADIOL 0.0375 MG/24HR TD PTTW: 1.0000 | MEDICATED_PATCH | TRANSDERMAL | 4 refills | Status: DC

## 2021-11-30 NOTE — Progress Notes (Unsigned)
    [name] [birth year] [id number]        71 y.o.  [id number]   RP: Urinary frequency and burning  HPI: C/O urinary frequency and vulvar burning.  No pelvic pain.  S/P Total Hysterectomy with BSO.  Postmenopausal on HRT with Estradiol 0.075 patch twice a week for many years.  Fam h/o Breast Ca.    OB History  Gravida Para Term Preterm AB Living  '3 2 2 '$ 0 1 2  SAB IAB Ectopic Multiple Live Births  0 1 0        # Outcome Date GA Lbr Len/2nd Weight Sex Delivery Anes PTL Lv  3 IAB           2 Term           1 Term             Past medical history,surgical history, problem list, medications, allergies, family history and social history were all reviewed and documented in the EPIC chart.   Directed ROS with pertinent positives and negatives documented in the history of present illness/assessment and plan.  Exam:  Vitals:   11/30/21 1531  BP: 118/78  Pulse: 97  SpO2: 97%   General appearance:  Normal  Abdomen: Normal  Gynecologic exam: Vulva wnl.  Speculum:  Vaginal discharge present.  Wet prep done.  U/A:  Slightly cloudy, otherwise completely Negative Wet prep:  Yeasts present with budding   Assessment/Plan:  71 y.o. [id number]   1. Urinary frequency U/A slightly cloudy, but otherwise negative.  Patient reassured. - Urinalysis,Complete w/RFL Culture  2. Vulvar burning C/O urinary frequency and vulvar burning.  Yeast vaginitis confirmed by Wet prep.  Will treat with Fluconazole.  Usage reviewed and prescription sent to pharmacy. - WET PREP FOR [place], YEAST, CLUE  3. Postmenopausal hormone replacement therapy Still on HRT with Estradiol patch at 0.075 twice a week.  Patient informed that her risks exceed her benefits at this time.  Especially the risks of stroke and breast Ca.  Patient voiced understanding and agreement, but not ready to stop completely right now.  Will wean by applying 1/2 a patch of the patches she has left, then with decrease to a 0.0375  patch twice a week and wean further from there to finally stop.  4. S/P total hysterectomy and BSO (bilateral salpingo-oophorectomy)  Other orders - fluconazole (DIFLUCAN) 150 MG tablet; Take 1 tablet (150 mg total) by mouth every other day for 3 doses. - estradiol (VIVELLE-DOT) 0.0375 MG/24HR; Place 1 patch onto the skin 2 (two) times a week. Weaning HRT.   Princess Bruins MD, 3:39 PM 11/30/2021

## 2021-12-01 ENCOUNTER — Encounter: Payer: Self-pay | Admitting: Obstetrics & Gynecology

## 2021-12-28 ENCOUNTER — Telehealth: Payer: Self-pay

## 2021-12-28 NOTE — Patient Outreach (Signed)
  Care Coordination   12/28/2021 Name: [name] MRN: [id number] DOB: 04/05/1950   Care Coordination Outreach Attempts:  A second unsuccessful outreach was attempted today to offer the patient with information about available care coordination services as a benefit of their health plan.     Follow Up Plan:  Additional outreach attempts will be made to offer the patient care coordination information and services.   Encounter Outcome:  No Answer   Care Coordination Interventions:  No, not indicated    Tomasa Rand, RN, BSN, [facility] [employer] [phone]

## 2021-12-29 DIAGNOSIS — R196 Halitosis: Secondary | ICD-10-CM | POA: Diagnosis not present

## 2021-12-29 DIAGNOSIS — R140 Abdominal distension (gaseous): Secondary | ICD-10-CM | POA: Diagnosis not present

## 2021-12-29 DIAGNOSIS — K580 Irritable bowel syndrome with diarrhea: Secondary | ICD-10-CM | POA: Diagnosis not present

## 2021-12-30 ENCOUNTER — Telehealth: Payer: Self-pay

## 2021-12-30 NOTE — Patient Outreach (Signed)
  Care Coordination   12/30/2021 Name: [name] MRN: [id number] DOB: November 03, 1950   Care Coordination Outreach Attempts:  A third unsuccessful outreach was attempted today to offer the patient with information about available care coordination services as a benefit of their health plan.   Follow Up Plan:  No further outreach attempts will be made at this time. We have been unable to contact the patient to offer or enroll patient in care coordination services  Encounter Outcome:  No Answer   Care Coordination Interventions:  No, not indicated    Tomasa Rand, RN, BSN, CEN [place] Coordinator [phone]

## 2022-01-05 DIAGNOSIS — R140 Abdominal distension (gaseous): Secondary | ICD-10-CM | POA: Diagnosis not present

## 2022-03-13 DIAGNOSIS — R300 Dysuria: Secondary | ICD-10-CM | POA: Diagnosis not present

## 2022-03-13 DIAGNOSIS — R109 Unspecified abdominal pain: Secondary | ICD-10-CM | POA: Diagnosis not present

## 2022-04-03 DIAGNOSIS — K635 Polyp of colon: Secondary | ICD-10-CM | POA: Diagnosis not present

## 2022-04-03 DIAGNOSIS — Z1211 Encounter for screening for malignant neoplasm of colon: Secondary | ICD-10-CM | POA: Diagnosis not present

## 2022-04-03 DIAGNOSIS — Z800 Family history of malignant neoplasm of digestive organs: Secondary | ICD-10-CM | POA: Diagnosis not present

## 2022-04-03 DIAGNOSIS — I10 Essential (primary) hypertension: Secondary | ICD-10-CM | POA: Diagnosis not present

## 2022-04-03 DIAGNOSIS — K5730 Diverticulosis of large intestine without perforation or abscess without bleeding: Secondary | ICD-10-CM | POA: Diagnosis not present

## 2022-04-03 DIAGNOSIS — Z86010 Personal history of colonic polyps: Secondary | ICD-10-CM | POA: Diagnosis not present

## 2022-04-03 DIAGNOSIS — D123 Benign neoplasm of transverse colon: Secondary | ICD-10-CM | POA: Diagnosis not present

## 2022-05-10 DIAGNOSIS — Z136 Encounter for screening for cardiovascular disorders: Secondary | ICD-10-CM | POA: Diagnosis not present

## 2022-05-10 DIAGNOSIS — M797 Fibromyalgia: Secondary | ICD-10-CM | POA: Diagnosis not present

## 2022-05-10 DIAGNOSIS — I10 Essential (primary) hypertension: Secondary | ICD-10-CM | POA: Diagnosis not present

## 2022-05-10 DIAGNOSIS — R7303 Prediabetes: Secondary | ICD-10-CM | POA: Diagnosis not present

## 2022-05-10 DIAGNOSIS — R600 Localized edema: Secondary | ICD-10-CM | POA: Diagnosis not present

## 2022-05-10 DIAGNOSIS — E038 Other specified hypothyroidism: Secondary | ICD-10-CM | POA: Diagnosis not present

## 2022-05-10 DIAGNOSIS — R1084 Generalized abdominal pain: Secondary | ICD-10-CM | POA: Diagnosis not present

## 2022-05-10 DIAGNOSIS — R300 Dysuria: Secondary | ICD-10-CM | POA: Diagnosis not present

## 2022-05-10 DIAGNOSIS — D45 Polycythemia vera: Secondary | ICD-10-CM | POA: Diagnosis not present

## 2022-06-01 ENCOUNTER — Ambulatory Visit (HOSPITAL_COMMUNITY): Payer: Medicare PPO

## 2022-06-13 DIAGNOSIS — R262 Difficulty in walking, not elsewhere classified: Secondary | ICD-10-CM | POA: Diagnosis not present

## 2022-06-13 DIAGNOSIS — M170 Bilateral primary osteoarthritis of knee: Secondary | ICD-10-CM | POA: Diagnosis not present

## 2022-06-13 DIAGNOSIS — M25561 Pain in right knee: Secondary | ICD-10-CM | POA: Diagnosis not present

## 2022-06-13 DIAGNOSIS — M25562 Pain in left knee: Secondary | ICD-10-CM | POA: Diagnosis not present

## 2022-06-21 DIAGNOSIS — M25562 Pain in left knee: Secondary | ICD-10-CM | POA: Diagnosis not present

## 2022-06-21 DIAGNOSIS — R262 Difficulty in walking, not elsewhere classified: Secondary | ICD-10-CM | POA: Diagnosis not present

## 2022-06-21 DIAGNOSIS — M170 Bilateral primary osteoarthritis of knee: Secondary | ICD-10-CM | POA: Diagnosis not present

## 2022-06-21 DIAGNOSIS — M25561 Pain in right knee: Secondary | ICD-10-CM | POA: Diagnosis not present

## 2022-06-28 DIAGNOSIS — M170 Bilateral primary osteoarthritis of knee: Secondary | ICD-10-CM | POA: Diagnosis not present

## 2022-06-28 DIAGNOSIS — M25562 Pain in left knee: Secondary | ICD-10-CM | POA: Diagnosis not present

## 2022-06-28 DIAGNOSIS — M25561 Pain in right knee: Secondary | ICD-10-CM | POA: Diagnosis not present

## 2022-06-28 DIAGNOSIS — R262 Difficulty in walking, not elsewhere classified: Secondary | ICD-10-CM | POA: Diagnosis not present

## 2022-07-05 DIAGNOSIS — M25562 Pain in left knee: Secondary | ICD-10-CM | POA: Diagnosis not present

## 2022-07-05 DIAGNOSIS — R262 Difficulty in walking, not elsewhere classified: Secondary | ICD-10-CM | POA: Diagnosis not present

## 2022-07-05 DIAGNOSIS — M25561 Pain in right knee: Secondary | ICD-10-CM | POA: Diagnosis not present

## 2022-07-05 DIAGNOSIS — M170 Bilateral primary osteoarthritis of knee: Secondary | ICD-10-CM | POA: Diagnosis not present

## 2022-07-12 DIAGNOSIS — M25562 Pain in left knee: Secondary | ICD-10-CM | POA: Diagnosis not present

## 2022-07-12 DIAGNOSIS — M170 Bilateral primary osteoarthritis of knee: Secondary | ICD-10-CM | POA: Diagnosis not present

## 2022-07-12 DIAGNOSIS — M25561 Pain in right knee: Secondary | ICD-10-CM | POA: Diagnosis not present

## 2022-07-12 DIAGNOSIS — R262 Difficulty in walking, not elsewhere classified: Secondary | ICD-10-CM | POA: Diagnosis not present

## 2022-07-25 DIAGNOSIS — R7303 Prediabetes: Secondary | ICD-10-CM | POA: Diagnosis not present

## 2022-07-25 DIAGNOSIS — D45 Polycythemia vera: Secondary | ICD-10-CM | POA: Diagnosis not present

## 2022-07-25 DIAGNOSIS — E038 Other specified hypothyroidism: Secondary | ICD-10-CM | POA: Diagnosis not present

## 2022-07-31 DIAGNOSIS — M797 Fibromyalgia: Secondary | ICD-10-CM | POA: Diagnosis not present

## 2022-07-31 DIAGNOSIS — D751 Secondary polycythemia: Secondary | ICD-10-CM | POA: Diagnosis not present

## 2022-07-31 DIAGNOSIS — G8929 Other chronic pain: Secondary | ICD-10-CM | POA: Diagnosis not present

## 2022-07-31 DIAGNOSIS — J4520 Mild intermittent asthma, uncomplicated: Secondary | ICD-10-CM | POA: Diagnosis not present

## 2022-07-31 DIAGNOSIS — M450 Ankylosing spondylitis of multiple sites in spine: Secondary | ICD-10-CM | POA: Diagnosis not present

## 2022-07-31 DIAGNOSIS — R200 Anesthesia of skin: Secondary | ICD-10-CM | POA: Diagnosis not present

## 2022-07-31 DIAGNOSIS — Z79890 Hormone replacement therapy (postmenopausal): Secondary | ICD-10-CM | POA: Diagnosis not present

## 2022-07-31 DIAGNOSIS — I10 Essential (primary) hypertension: Secondary | ICD-10-CM | POA: Diagnosis not present

## 2022-07-31 DIAGNOSIS — M546 Pain in thoracic spine: Secondary | ICD-10-CM | POA: Diagnosis not present

## 2022-07-31 DIAGNOSIS — M069 Rheumatoid arthritis, unspecified: Secondary | ICD-10-CM | POA: Diagnosis not present

## 2022-07-31 DIAGNOSIS — G4739 Other sleep apnea: Secondary | ICD-10-CM | POA: Diagnosis not present

## 2022-08-06 ENCOUNTER — Emergency Department (HOSPITAL_BASED_OUTPATIENT_CLINIC_OR_DEPARTMENT_OTHER): Payer: Medicare PPO

## 2022-08-06 ENCOUNTER — Emergency Department (HOSPITAL_COMMUNITY)
Admission: EM | Admit: 2022-08-06 | Discharge: 2022-08-06 | Disposition: A | Discharge status: Home / Self Care | Payer: Medicare PPO | Attending: Emergency Medicine | Admitting: Emergency Medicine

## 2022-08-06 ENCOUNTER — Emergency Department (HOSPITAL_COMMUNITY): Payer: Medicare PPO

## 2022-08-06 ENCOUNTER — Other Ambulatory Visit: Payer: Self-pay

## 2022-08-06 ENCOUNTER — Encounter (HOSPITAL_COMMUNITY): Payer: Self-pay

## 2022-08-06 DIAGNOSIS — R109 Unspecified abdominal pain: Secondary | ICD-10-CM | POA: Insufficient documentation

## 2022-08-06 DIAGNOSIS — J45909 Unspecified asthma, uncomplicated: Secondary | ICD-10-CM | POA: Insufficient documentation

## 2022-08-06 DIAGNOSIS — Z96641 Presence of right artificial hip joint: Secondary | ICD-10-CM | POA: Diagnosis not present

## 2022-08-06 DIAGNOSIS — E039 Hypothyroidism, unspecified: Secondary | ICD-10-CM | POA: Diagnosis not present

## 2022-08-06 DIAGNOSIS — M549 Dorsalgia, unspecified: Secondary | ICD-10-CM | POA: Insufficient documentation

## 2022-08-06 DIAGNOSIS — R4020 Unspecified coma: Secondary | ICD-10-CM | POA: Diagnosis not present

## 2022-08-06 DIAGNOSIS — R600 Localized edema: Secondary | ICD-10-CM | POA: Insufficient documentation

## 2022-08-06 DIAGNOSIS — R55 Syncope and collapse: Secondary | ICD-10-CM | POA: Insufficient documentation

## 2022-08-06 DIAGNOSIS — M7989 Other specified soft tissue disorders: Secondary | ICD-10-CM | POA: Insufficient documentation

## 2022-08-06 DIAGNOSIS — I10 Essential (primary) hypertension: Secondary | ICD-10-CM | POA: Diagnosis not present

## 2022-08-06 DIAGNOSIS — R569 Unspecified convulsions: Secondary | ICD-10-CM | POA: Diagnosis not present

## 2022-08-06 LAB — BASIC METABOLIC PANEL
Anion gap: 11 (ref 5–15)
BUN: 17 mg/dL (ref 8–23)
CO2: 27 mmol/L (ref 22–32)
Calcium: 9.3 mg/dL (ref 8.9–10.3)
Chloride: 101 mmol/L (ref 98–111)
Creatinine, Ser: 1.33 mg/dL — ABNORMAL HIGH (ref 0.44–1.00)
GFR, Estimated: 43 mL/min — ABNORMAL LOW (ref >60)
Glucose, Bld: 142 mg/dL — ABNORMAL HIGH (ref 70–99)
Potassium: 4.2 mmol/L (ref 3.5–5.1)
Sodium: 139 mmol/L (ref 135–145)

## 2022-08-06 LAB — CBC WITH DIFFERENTIAL/PLATELET
Abs Immature Granulocytes: 0.05 10*3/uL (ref 0.00–0.07)
Basophils Absolute: 0 10*3/uL (ref 0.0–0.1)
Basophils Relative: 0 %
Eosinophils Absolute: 0.1 10*3/uL (ref 0.0–0.5)
Eosinophils Relative: 0 %
HCT: 51.3 % — ABNORMAL HIGH (ref 36.0–46.0)
Hemoglobin: 16.5 g/dL — ABNORMAL HIGH (ref 12.0–15.0)
Immature Granulocytes: 0 %
Lymphocytes Relative: 25 %
Lymphs Abs: 3 10*3/uL (ref 0.7–4.0)
MCH: 27.9 pg (ref 26.0–34.0)
MCHC: 32.2 g/dL (ref 30.0–36.0)
MCV: 86.7 fL (ref 80.0–100.0)
Monocytes Absolute: 0.7 10*3/uL (ref 0.1–1.0)
Monocytes Relative: 5 %
Neutro Abs: 8.3 10*3/uL — ABNORMAL HIGH (ref 1.7–7.7)
Neutrophils Relative %: 70 %
Platelets: 340 10*3/uL (ref 150–400)
RBC: 5.92 MIL/uL — ABNORMAL HIGH (ref 3.87–5.11)
RDW: 15.1 % (ref 11.5–15.5)
WBC: 12 10*3/uL — ABNORMAL HIGH (ref 4.0–10.5)
nRBC: 0 % (ref 0.0–0.2)

## 2022-08-06 LAB — TROPONIN I (HIGH SENSITIVITY): Troponin I (High Sensitivity): 13 ng/L (ref <18)

## 2022-08-06 LAB — BRAIN NATRIURETIC PEPTIDE: B Natriuretic Peptide: 10.4 pg/mL (ref 0.0–100.0)

## 2022-08-06 LAB — D-DIMER, QUANTITATIVE: D-Dimer, Quant: 0.63 ug/mL-FEU — ABNORMAL HIGH (ref 0.00–0.50)

## 2022-08-06 MED ORDER — OXYCODONE-ACETAMINOPHEN 5-325 MG PO TABS
1.0000 | ORAL_TABLET | Freq: Once | ORAL | Status: AC
  Administered 2022-08-06: 1 via ORAL
  Filled 2022-08-06: qty 1

## 2022-08-06 NOTE — ED Triage Notes (Signed)
Patient [name] from hair salon where she had a episode of being unresponsive. Witnesses said she was moaning and could not speak. Patient had a similar episode a week ago with no diagnosis. On arrival to scene EMS reports she was awake but had complaints of malaise and fatigue. Patient stated that she became very nauseous and felt like she was going to pass out prior to the episode. EMS reports she has had some swelling bilaterally in lower extremities but it is worse on the right and he right arm is also swollen. On arrival to ED patient is alert and oriented x4 and able to stand to transfer from stretcher to the bed.

## 2022-08-06 NOTE — Discharge Instructions (Addendum)
You are seen today for syncope, also known as passing out.  While you are here we got blood work, got imaging, performed a physical exam, get an EKG, and monitored your vital signs.  These were all reassuring.  We saw no evidence of blood clots in your legs.  Additionally, he did not have any significant electrolyte abnormalities.  As we discussed, please plan to follow-up with your primary care doctor in the next 1 to 2 weeks to continue your care.  Return to the emergency department if you have any new symptoms including passing out again, feeling lightheaded, chest pain, shortness of breath, dizziness or any other concerning symptoms.

## 2022-08-06 NOTE — Progress Notes (Signed)
VASCULAR LAB    Bilateral lower extremity venous duplex has been performed.  See CV proc for preliminary results.   Messaged negative results to Dr. Willeen Cass via secure chat  Rosezetta Schlatter, [facility], RVT 08/06/2022, 7:00 PM

## 2022-08-06 NOTE — ED Provider Notes (Signed)
[place] EMERGENCY DEPARTMENT AT [facility] Provider Note  MDM   HPI/ROS:  [name] is a 72 y.o. female with a medical history as below who presents for evaluation of syncope.  She was at her care center earlier getting her hair done when she had a syncopal episode in her chair.  She did not fall out of the chair, did not hit her head.  She roused shortly thereafter.  There was no shaking.  EMS was called and she did not appear postictal.  They did not give her any medications.  Transported her here.  She is complaining of some mild abdominal pain, and back pain and lower extremity pain which she states is chronic in nature.  She said that when this happened she started feeling hot, and felt as though she was going to pass out.  This happened 1 other time approximately 1 week ago when she was getting blood drawn.  Physical exam is notable for: - Mild bilateral pedal edema - No murmurs rubs or gallop - Critical bilateral breath sounds  On my initial evaluation, patient is:  -Vital signs stable. Patient afebrile, hemodynamically stable, and non-toxic appearing. -Additional history obtained from chart review  This patient's current presentation, including their history and physical exam, is most consistent with syncope of undetermined etiology.  Her previous episode does sound like it was a vasovagal in origin, and due to the fact that she was getting her hair done today, it may have been similar circumstance.  However, we will plan for basic labs including a troponin.  She is not tachycardic, does not have an oxygen requirement, she is not short of breath.  She is Wells moderate risk so we will plan to pursue a D-dimer prior to any CTA.  Interpretations, interventions, and the patient's course of care are documented below.    Clinical Course as of 08/06/22 2218  Wynelle Link Aug 06, 2022  1700 Troponin I (High Sensitivity): 13 Negative [BB]  1813 D-dimer, quantitative(!) Using  age-adjusted D-dimer, her D-dimer is negative.  [BB]  1900 VAS [place] LOWER EXTREMITY VENOUS (DVT) (ONLY [facility]) DVT study is negative, no evidence of DVT.  This combined with the negative D-dimer, made the decision to discontinue the CTA PE study. [BB]    Clinical Course User Index [BB] Fayrene Helper, MD      Disposition:  I discussed the plan for discharge with the patient and/or their surrogate at bedside prior to discharge and they were in agreement with the plan and verbalized understanding of the return precautions provided. All questions answered to the best of my ability. Ultimately, the patient was discharged in stable condition with stable vital signs. I am reassured that they are capable of close follow up and good social support at home.   Clinical Impression:  1. Vasovagal syncope     Rx / DC Orders ED Discharge Orders     None       The plan for this patient was discussed with Dr. Silverio Lay, who voiced agreement and who oversaw evaluation and treatment of this patient.   Clinical Complexity A medically appropriate history, review of systems, and physical exam was performed.  My independent interpretations of EKG, labs, and radiology are documented in the ED course above.   If decision rules were used in this patient's evaluation, they are listed below.   Click here for ABCD2, HEART and other calculatorsREFRESH Note before signing   Patient's presentation is most consistent with  acute presentation with potential threat to life or bodily function.  Medical Decision Making Amount and/or Complexity of Data Reviewed Labs: ordered. Decision-making details documented in ED Course. Radiology: ordered.  Risk Prescription drug management.    HPI/ROS      See MDM section for pertinent HPI and ROS. A complete ROS was performed with pertinent positives/negatives noted above.   Past Medical History:  Diagnosis Date   Ankylosing spondylitis (HCC)    Anxiety     Arthritis    Asthma    Biliary dyskinesia    Bowel obstruction (HCC)    BRCA negative 02/2013   Cervical dysplasia    Complication of anesthesia    "I have the shakes"   Depression    Diverticulosis    Elevated cholesterol    Endometriosis    Fibromyalgia    GERD (gastroesophageal reflux disease)    Heart murmur    mvp   Hx of oral aphthous ulcers    Hypertension    Hypertension    no med   Hypothyroidism    hx nodule on thyroid    Impaired glucose tolerance 02/25/2011   Low back pain    Migraines    Nocturia    Polycythemia vera (HCC)    Rheumatoid arthritis (HCC)    Shingles    Sickle cell trait (HCC)    Sleep apnea    has not used c-pap x 1 yr. use mouthguard now    Past Surgical History:  Procedure Laterality Date   ABDOMINAL HYSTERECTOMY     BREAST EXCISIONAL BIOPSY Left 1999   BREAST SURGERY     Cystectomy on Left breast   BREAST SURGERY     Benign breast cyst   CHOLECYSTECTOMY     CHOLECYSTECTOMY  12/05/2011   Procedure: LAPAROSCOPIC CHOLECYSTECTOMY;  Surgeon: Axel Filler, MD;  Location: [facility];  Service: General;  Laterality: N/A;   COLPOSCOPY     GYNECOLOGIC CRYOSURGERY     HERNIA REPAIR     abdomen   JOINT REPLACEMENT Right    hip   OOPHORECTOMY     BSO   PANNICULECTOMY N/A 06/12/2014   Procedure: PANNICULECTOMY;  Surgeon: Glenna Fellows, MD;  Location: [facility];  Service: Plastics;  Laterality: N/A;   PELVIC LAPAROSCOPY  1995,2002   DL LSO DL RSO   TONSILLECTOMY     TOTAL HIP ARTHROPLASTY     Right   VAGINAL HYSTERECTOMY  1983      Physical Exam   Vitals:   08/06/22 1800 08/06/22 1815 08/06/22 1845 08/06/22 1915  BP: (!) 118/94 117/88 (!) 132/93 113/85  Pulse: 82 82 79 79  Resp: 15 (!) 24 17 15   Temp:      TempSrc:      SpO2: 97% 96% 98% 97%  Weight:      Height:        Physical Exam Vitals and nursing note reviewed.  Constitutional:      General: She is not in acute distress.    Appearance: She is  well-developed. She is obese.  HENT:     Head: Normocephalic and atraumatic.  Eyes:     Conjunctiva/sclera: Conjunctivae normal.     Pupils: Pupils are equal, round, and reactive to light.  Cardiovascular:     Rate and Rhythm: Normal rate and regular rhythm.     Heart sounds: Normal heart sounds. No murmur heard. Pulmonary:     Effort: Pulmonary effort is normal. No respiratory distress.  Breath sounds: Normal breath sounds.  Abdominal:     Palpations: Abdomen is soft.     Tenderness: There is no abdominal tenderness. There is no guarding or rebound.  Musculoskeletal:     Cervical back: Neck supple.     Right lower leg: Edema (Minor) present.     Left lower leg: Edema (Minor) present.  Skin:    General: Skin is warm and dry.     Capillary Refill: Capillary refill takes less than 2 seconds.  Neurological:     General: No focal deficit present.     Mental Status: She is alert and oriented to person, place, and time.  Psychiatric:        Mood and Affect: Mood normal.      Procedures   If procedures were preformed on this patient, they are listed below:  Procedures   Fayrene Helper, MD Emergency Medicine PGY-2   Please note that this documentation was produced with the assistance of voice-to-text technology and may contain errors.    Fayrene Helper, MD 08/06/22 2218    Charlynne Pander, MD 08/08/22 [phone]

## 2022-08-11 ENCOUNTER — Telehealth: Payer: Self-pay | Admitting: *Deleted

## 2022-08-11 NOTE — Telephone Encounter (Signed)
Transition Care Management Unsuccessful Follow-up Telephone Call  Date of discharge and from where:  [place] ed 08/06/2022  Attempts:  1st Attempt  Reason for unsuccessful TCM follow-up call:  Left voice message

## 2022-09-13 DIAGNOSIS — M25561 Pain in right knee: Secondary | ICD-10-CM | POA: Diagnosis not present

## 2022-09-13 DIAGNOSIS — M25562 Pain in left knee: Secondary | ICD-10-CM | POA: Diagnosis not present

## 2022-09-13 DIAGNOSIS — M545 Low back pain, unspecified: Secondary | ICD-10-CM | POA: Diagnosis not present

## 2022-09-13 DIAGNOSIS — M170 Bilateral primary osteoarthritis of knee: Secondary | ICD-10-CM | POA: Diagnosis not present

## 2022-09-14 DIAGNOSIS — M797 Fibromyalgia: Secondary | ICD-10-CM | POA: Diagnosis not present

## 2022-09-14 DIAGNOSIS — Z09 Encounter for follow-up examination after completed treatment for conditions other than malignant neoplasm: Secondary | ICD-10-CM | POA: Diagnosis not present

## 2022-09-14 DIAGNOSIS — R42 Dizziness and giddiness: Secondary | ICD-10-CM | POA: Diagnosis not present

## 2022-09-14 DIAGNOSIS — I10 Essential (primary) hypertension: Secondary | ICD-10-CM | POA: Diagnosis not present

## 2022-09-14 DIAGNOSIS — J4520 Mild intermittent asthma, uncomplicated: Secondary | ICD-10-CM | POA: Diagnosis not present

## 2022-09-14 DIAGNOSIS — D751 Secondary polycythemia: Secondary | ICD-10-CM | POA: Diagnosis not present

## 2022-09-14 DIAGNOSIS — R55 Syncope and collapse: Secondary | ICD-10-CM | POA: Diagnosis not present

## 2022-09-14 DIAGNOSIS — Z136 Encounter for screening for cardiovascular disorders: Secondary | ICD-10-CM | POA: Diagnosis not present

## 2022-09-26 DIAGNOSIS — I10 Essential (primary) hypertension: Secondary | ICD-10-CM | POA: Diagnosis not present

## 2022-09-26 DIAGNOSIS — Z79890 Hormone replacement therapy (postmenopausal): Secondary | ICD-10-CM | POA: Diagnosis not present

## 2022-09-28 DIAGNOSIS — R55 Syncope and collapse: Secondary | ICD-10-CM | POA: Diagnosis not present

## 2022-09-28 DIAGNOSIS — R42 Dizziness and giddiness: Secondary | ICD-10-CM | POA: Diagnosis not present

## 2022-09-29 DIAGNOSIS — E039 Hypothyroidism, unspecified: Secondary | ICD-10-CM | POA: Diagnosis not present

## 2022-09-29 DIAGNOSIS — N179 Acute kidney failure, unspecified: Secondary | ICD-10-CM | POA: Diagnosis not present

## 2022-09-29 DIAGNOSIS — R3589 Other polyuria: Secondary | ICD-10-CM | POA: Diagnosis not present

## 2022-09-29 DIAGNOSIS — R7309 Other abnormal glucose: Secondary | ICD-10-CM | POA: Diagnosis not present

## 2022-09-29 DIAGNOSIS — M2550 Pain in unspecified joint: Secondary | ICD-10-CM | POA: Diagnosis not present

## 2022-09-29 DIAGNOSIS — R5383 Other fatigue: Secondary | ICD-10-CM | POA: Diagnosis not present

## 2022-09-29 DIAGNOSIS — D45 Polycythemia vera: Secondary | ICD-10-CM | POA: Diagnosis not present

## 2022-10-02 DIAGNOSIS — M2550 Pain in unspecified joint: Secondary | ICD-10-CM | POA: Diagnosis not present

## 2022-10-02 DIAGNOSIS — R5383 Other fatigue: Secondary | ICD-10-CM | POA: Diagnosis not present

## 2022-10-02 DIAGNOSIS — R7309 Other abnormal glucose: Secondary | ICD-10-CM | POA: Diagnosis not present

## 2022-10-02 DIAGNOSIS — N179 Acute kidney failure, unspecified: Secondary | ICD-10-CM | POA: Diagnosis not present

## 2022-10-02 DIAGNOSIS — D45 Polycythemia vera: Secondary | ICD-10-CM | POA: Diagnosis not present

## 2022-10-02 DIAGNOSIS — R3589 Other polyuria: Secondary | ICD-10-CM | POA: Diagnosis not present

## 2022-10-06 DIAGNOSIS — E039 Hypothyroidism, unspecified: Secondary | ICD-10-CM | POA: Diagnosis not present

## 2022-10-06 DIAGNOSIS — M2550 Pain in unspecified joint: Secondary | ICD-10-CM | POA: Diagnosis not present

## 2022-10-06 DIAGNOSIS — D751 Secondary polycythemia: Secondary | ICD-10-CM | POA: Diagnosis not present

## 2022-10-06 DIAGNOSIS — E785 Hyperlipidemia, unspecified: Secondary | ICD-10-CM | POA: Diagnosis not present

## 2022-10-06 DIAGNOSIS — G4733 Obstructive sleep apnea (adult) (pediatric): Secondary | ICD-10-CM | POA: Diagnosis not present

## 2022-10-06 DIAGNOSIS — I10 Essential (primary) hypertension: Secondary | ICD-10-CM | POA: Diagnosis not present

## 2022-10-06 DIAGNOSIS — J019 Acute sinusitis, unspecified: Secondary | ICD-10-CM | POA: Diagnosis not present

## 2022-10-06 DIAGNOSIS — R944 Abnormal results of kidney function studies: Secondary | ICD-10-CM | POA: Diagnosis not present

## 2022-10-30 DIAGNOSIS — R002 Palpitations: Secondary | ICD-10-CM | POA: Diagnosis not present

## 2022-10-30 DIAGNOSIS — I10 Essential (primary) hypertension: Secondary | ICD-10-CM | POA: Diagnosis not present

## 2022-10-30 DIAGNOSIS — R0609 Other forms of dyspnea: Secondary | ICD-10-CM | POA: Diagnosis not present

## 2022-10-30 DIAGNOSIS — R0602 Shortness of breath: Secondary | ICD-10-CM | POA: Diagnosis not present

## 2022-10-30 DIAGNOSIS — R55 Syncope and collapse: Secondary | ICD-10-CM | POA: Diagnosis not present

## 2022-10-30 DIAGNOSIS — R0789 Other chest pain: Secondary | ICD-10-CM | POA: Diagnosis not present

## 2022-10-30 DIAGNOSIS — E785 Hyperlipidemia, unspecified: Secondary | ICD-10-CM | POA: Diagnosis not present

## 2022-10-30 DIAGNOSIS — R42 Dizziness and giddiness: Secondary | ICD-10-CM | POA: Diagnosis not present

## 2022-11-07 DIAGNOSIS — R42 Dizziness and giddiness: Secondary | ICD-10-CM | POA: Diagnosis not present

## 2022-11-07 DIAGNOSIS — Z79899 Other long term (current) drug therapy: Secondary | ICD-10-CM | POA: Diagnosis not present

## 2022-11-07 DIAGNOSIS — R799 Abnormal finding of blood chemistry, unspecified: Secondary | ICD-10-CM | POA: Diagnosis not present

## 2022-11-07 DIAGNOSIS — Z1152 Encounter for screening for COVID-19: Secondary | ICD-10-CM | POA: Diagnosis not present

## 2022-11-07 DIAGNOSIS — R55 Syncope and collapse: Secondary | ICD-10-CM | POA: Diagnosis not present

## 2022-11-07 DIAGNOSIS — D751 Secondary polycythemia: Secondary | ICD-10-CM | POA: Diagnosis not present

## 2022-11-07 DIAGNOSIS — Z7982 Long term (current) use of aspirin: Secondary | ICD-10-CM | POA: Diagnosis not present

## 2022-11-10 DIAGNOSIS — D72828 Other elevated white blood cell count: Secondary | ICD-10-CM | POA: Diagnosis not present

## 2022-11-10 DIAGNOSIS — D751 Secondary polycythemia: Secondary | ICD-10-CM | POA: Diagnosis not present

## 2022-11-15 DIAGNOSIS — R55 Syncope and collapse: Secondary | ICD-10-CM | POA: Diagnosis not present

## 2022-11-15 DIAGNOSIS — Z832 Family history of diseases of the blood and blood-forming organs and certain disorders involving the immune mechanism: Secondary | ICD-10-CM | POA: Diagnosis not present

## 2022-11-15 DIAGNOSIS — E785 Hyperlipidemia, unspecified: Secondary | ICD-10-CM | POA: Diagnosis not present

## 2022-11-15 DIAGNOSIS — D45 Polycythemia vera: Secondary | ICD-10-CM | POA: Diagnosis not present

## 2022-11-15 DIAGNOSIS — I10 Essential (primary) hypertension: Secondary | ICD-10-CM | POA: Diagnosis not present

## 2022-11-15 DIAGNOSIS — H25013 Cortical age-related cataract, bilateral: Secondary | ICD-10-CM | POA: Diagnosis not present

## 2022-11-15 DIAGNOSIS — Z79899 Other long term (current) drug therapy: Secondary | ICD-10-CM | POA: Diagnosis not present

## 2022-11-15 DIAGNOSIS — G43001 Migraine without aura, not intractable, with status migrainosus: Secondary | ICD-10-CM | POA: Diagnosis not present

## 2022-11-15 DIAGNOSIS — R6889 Other general symptoms and signs: Secondary | ICD-10-CM | POA: Diagnosis not present

## 2022-11-20 DIAGNOSIS — R42 Dizziness and giddiness: Secondary | ICD-10-CM | POA: Diagnosis not present

## 2022-11-20 DIAGNOSIS — R002 Palpitations: Secondary | ICD-10-CM | POA: Diagnosis not present

## 2022-11-27 DIAGNOSIS — M170 Bilateral primary osteoarthritis of knee: Secondary | ICD-10-CM | POA: Diagnosis not present

## 2022-12-05 DIAGNOSIS — R55 Syncope and collapse: Secondary | ICD-10-CM | POA: Diagnosis not present

## 2022-12-05 DIAGNOSIS — R002 Palpitations: Secondary | ICD-10-CM | POA: Diagnosis not present

## 2022-12-08 DIAGNOSIS — D72828 Other elevated white blood cell count: Secondary | ICD-10-CM | POA: Diagnosis not present

## 2022-12-08 DIAGNOSIS — D751 Secondary polycythemia: Secondary | ICD-10-CM | POA: Diagnosis not present

## 2022-12-16 ENCOUNTER — Other Ambulatory Visit: Payer: Self-pay | Admitting: Obstetrics & Gynecology

## 2022-12-18 NOTE — Telephone Encounter (Signed)
Medication refill request: vivelle dot 0.0375mg  Last AEX:  11-26-20 Last OV: 11-30-21 Next AEX: not scheduled. Message sent to scheduling department to contact patient Last MMG (if hormonal medication request): 12-12-17 Refill authorized: please deny or approve as appropriate

## 2023-01-03 DIAGNOSIS — G43909 Migraine, unspecified, not intractable, without status migrainosus: Secondary | ICD-10-CM | POA: Diagnosis not present

## 2023-02-09 DIAGNOSIS — M170 Bilateral primary osteoarthritis of knee: Secondary | ICD-10-CM | POA: Diagnosis not present

## 2023-04-10 DIAGNOSIS — D72828 Other elevated white blood cell count: Secondary | ICD-10-CM | POA: Diagnosis not present

## 2023-04-10 DIAGNOSIS — D751 Secondary polycythemia: Secondary | ICD-10-CM | POA: Diagnosis not present

## 2023-05-02 DIAGNOSIS — R309 Painful micturition, unspecified: Secondary | ICD-10-CM | POA: Diagnosis not present

## 2023-05-02 DIAGNOSIS — L4050 Arthropathic psoriasis, unspecified: Secondary | ICD-10-CM | POA: Diagnosis not present

## 2023-05-02 DIAGNOSIS — K30 Functional dyspepsia: Secondary | ICD-10-CM | POA: Diagnosis not present

## 2023-05-02 DIAGNOSIS — M7989 Other specified soft tissue disorders: Secondary | ICD-10-CM | POA: Diagnosis not present

## 2023-05-02 DIAGNOSIS — R32 Unspecified urinary incontinence: Secondary | ICD-10-CM | POA: Diagnosis not present

## 2023-05-02 DIAGNOSIS — D45 Polycythemia vera: Secondary | ICD-10-CM | POA: Diagnosis not present

## 2023-05-02 DIAGNOSIS — R21 Rash and other nonspecific skin eruption: Secondary | ICD-10-CM | POA: Diagnosis not present

## 2023-05-02 DIAGNOSIS — M450 Ankylosing spondylitis of multiple sites in spine: Secondary | ICD-10-CM | POA: Diagnosis not present

## 2023-05-02 DIAGNOSIS — N760 Acute vaginitis: Secondary | ICD-10-CM | POA: Diagnosis not present

## 2023-05-02 DIAGNOSIS — R109 Unspecified abdominal pain: Secondary | ICD-10-CM | POA: Diagnosis not present

## 2023-05-14 DIAGNOSIS — R200 Anesthesia of skin: Secondary | ICD-10-CM | POA: Diagnosis not present

## 2023-05-14 DIAGNOSIS — R079 Chest pain, unspecified: Secondary | ICD-10-CM | POA: Diagnosis not present

## 2023-05-14 DIAGNOSIS — E860 Dehydration: Secondary | ICD-10-CM | POA: Diagnosis not present

## 2023-05-14 DIAGNOSIS — Z803 Family history of malignant neoplasm of breast: Secondary | ICD-10-CM | POA: Diagnosis not present

## 2023-05-14 DIAGNOSIS — R000 Tachycardia, unspecified: Secondary | ICD-10-CM | POA: Diagnosis not present

## 2023-05-14 DIAGNOSIS — Z1231 Encounter for screening mammogram for malignant neoplasm of breast: Secondary | ICD-10-CM | POA: Diagnosis not present

## 2023-05-14 DIAGNOSIS — R1031 Right lower quadrant pain: Secondary | ICD-10-CM | POA: Diagnosis not present

## 2023-05-14 DIAGNOSIS — R0789 Other chest pain: Secondary | ICD-10-CM | POA: Diagnosis not present

## 2023-05-14 DIAGNOSIS — M5431 Sciatica, right side: Secondary | ICD-10-CM | POA: Diagnosis not present

## 2023-05-14 DIAGNOSIS — R531 Weakness: Secondary | ICD-10-CM | POA: Diagnosis not present

## 2023-05-14 DIAGNOSIS — R55 Syncope and collapse: Secondary | ICD-10-CM | POA: Diagnosis not present

## 2023-05-14 DIAGNOSIS — R7989 Other specified abnormal findings of blood chemistry: Secondary | ICD-10-CM | POA: Diagnosis not present

## 2023-05-14 DIAGNOSIS — R110 Nausea: Secondary | ICD-10-CM | POA: Diagnosis not present

## 2023-05-14 DIAGNOSIS — R42 Dizziness and giddiness: Secondary | ICD-10-CM | POA: Diagnosis not present

## 2023-05-23 DIAGNOSIS — N952 Postmenopausal atrophic vaginitis: Secondary | ICD-10-CM | POA: Diagnosis not present

## 2023-05-23 DIAGNOSIS — N3946 Mixed incontinence: Secondary | ICD-10-CM | POA: Diagnosis not present

## 2023-05-23 DIAGNOSIS — R350 Frequency of micturition: Secondary | ICD-10-CM | POA: Diagnosis not present

## 2023-05-23 DIAGNOSIS — N898 Other specified noninflammatory disorders of vagina: Secondary | ICD-10-CM | POA: Diagnosis not present

## 2023-05-24 DIAGNOSIS — M170 Bilateral primary osteoarthritis of knee: Secondary | ICD-10-CM | POA: Diagnosis not present

## 2023-05-24 DIAGNOSIS — L4050 Arthropathic psoriasis, unspecified: Secondary | ICD-10-CM | POA: Diagnosis not present

## 2023-05-24 DIAGNOSIS — F339 Major depressive disorder, recurrent, unspecified: Secondary | ICD-10-CM | POA: Diagnosis not present

## 2023-05-24 DIAGNOSIS — J4520 Mild intermittent asthma, uncomplicated: Secondary | ICD-10-CM | POA: Diagnosis not present

## 2023-05-24 DIAGNOSIS — G4733 Obstructive sleep apnea (adult) (pediatric): Secondary | ICD-10-CM | POA: Diagnosis not present

## 2023-05-24 DIAGNOSIS — M797 Fibromyalgia: Secondary | ICD-10-CM | POA: Diagnosis not present

## 2023-05-24 DIAGNOSIS — R55 Syncope and collapse: Secondary | ICD-10-CM | POA: Diagnosis not present

## 2023-05-24 DIAGNOSIS — R0989 Other specified symptoms and signs involving the circulatory and respiratory systems: Secondary | ICD-10-CM | POA: Diagnosis not present

## 2023-05-24 DIAGNOSIS — I10 Essential (primary) hypertension: Secondary | ICD-10-CM | POA: Diagnosis not present

## 2023-05-30 DIAGNOSIS — R55 Syncope and collapse: Secondary | ICD-10-CM | POA: Diagnosis not present

## 2023-05-30 DIAGNOSIS — R600 Localized edema: Secondary | ICD-10-CM | POA: Diagnosis not present

## 2023-05-30 DIAGNOSIS — R9431 Abnormal electrocardiogram [ECG] [EKG]: Secondary | ICD-10-CM | POA: Diagnosis not present

## 2023-05-31 DIAGNOSIS — R55 Syncope and collapse: Secondary | ICD-10-CM | POA: Diagnosis not present

## 2023-06-13 DIAGNOSIS — R9431 Abnormal electrocardiogram [ECG] [EKG]: Secondary | ICD-10-CM | POA: Diagnosis not present

## 2023-06-19 DIAGNOSIS — S8992XA Unspecified injury of left lower leg, initial encounter: Secondary | ICD-10-CM | POA: Diagnosis not present

## 2023-06-19 DIAGNOSIS — M170 Bilateral primary osteoarthritis of knee: Secondary | ICD-10-CM | POA: Diagnosis not present

## 2023-06-19 DIAGNOSIS — R55 Syncope and collapse: Secondary | ICD-10-CM | POA: Diagnosis not present

## 2023-07-06 DIAGNOSIS — M170 Bilateral primary osteoarthritis of knee: Secondary | ICD-10-CM | POA: Diagnosis not present

## 2023-07-06 DIAGNOSIS — S8992XA Unspecified injury of left lower leg, initial encounter: Secondary | ICD-10-CM | POA: Diagnosis not present

## 2023-07-10 DIAGNOSIS — M170 Bilateral primary osteoarthritis of knee: Secondary | ICD-10-CM | POA: Diagnosis not present

## 2023-08-13 DIAGNOSIS — I739 Peripheral vascular disease, unspecified: Secondary | ICD-10-CM | POA: Diagnosis not present

## 2023-08-13 DIAGNOSIS — R0989 Other specified symptoms and signs involving the circulatory and respiratory systems: Secondary | ICD-10-CM | POA: Diagnosis not present

## 2023-08-13 DIAGNOSIS — R600 Localized edema: Secondary | ICD-10-CM | POA: Diagnosis not present

## 2023-08-28 DIAGNOSIS — I10 Essential (primary) hypertension: Secondary | ICD-10-CM | POA: Diagnosis not present

## 2023-08-28 DIAGNOSIS — E7800 Pure hypercholesterolemia, unspecified: Secondary | ICD-10-CM | POA: Diagnosis not present

## 2023-08-28 DIAGNOSIS — E039 Hypothyroidism, unspecified: Secondary | ICD-10-CM | POA: Diagnosis not present

## 2023-08-28 DIAGNOSIS — R55 Syncope and collapse: Secondary | ICD-10-CM | POA: Diagnosis not present

## 2023-09-20 DIAGNOSIS — Z4509 Encounter for adjustment and management of other cardiac device: Secondary | ICD-10-CM | POA: Diagnosis not present

## 2023-09-26 DIAGNOSIS — Z4509 Encounter for adjustment and management of other cardiac device: Secondary | ICD-10-CM | POA: Diagnosis not present

## 2023-10-02 DIAGNOSIS — M170 Bilateral primary osteoarthritis of knee: Secondary | ICD-10-CM | POA: Diagnosis not present

## 2023-10-25 DIAGNOSIS — Z4509 Encounter for adjustment and management of other cardiac device: Secondary | ICD-10-CM | POA: Diagnosis not present

## 2023-10-29 DIAGNOSIS — E039 Hypothyroidism, unspecified: Secondary | ICD-10-CM | POA: Diagnosis not present

## 2023-10-29 DIAGNOSIS — I10 Essential (primary) hypertension: Secondary | ICD-10-CM | POA: Diagnosis not present

## 2023-10-29 DIAGNOSIS — N3281 Overactive bladder: Secondary | ICD-10-CM | POA: Diagnosis not present

## 2023-10-29 DIAGNOSIS — M797 Fibromyalgia: Secondary | ICD-10-CM | POA: Diagnosis not present

## 2023-10-29 DIAGNOSIS — Z77120 Contact with and (suspected) exposure to mold (toxic): Secondary | ICD-10-CM | POA: Diagnosis not present

## 2023-10-29 DIAGNOSIS — K219 Gastro-esophageal reflux disease without esophagitis: Secondary | ICD-10-CM | POA: Diagnosis not present

## 2023-11-29 DIAGNOSIS — Z4509 Encounter for adjustment and management of other cardiac device: Secondary | ICD-10-CM | POA: Diagnosis not present

## 2024-04-20 IMAGING — DX DG CHEST 2V
2 series · 2 of 2 positions shown · non-contrast
Comparison: November 24, 2014

CLINICAL DATA: Dyspnea on exertion.

EXAM:
CHEST - 2 VIEW

[chest pa]
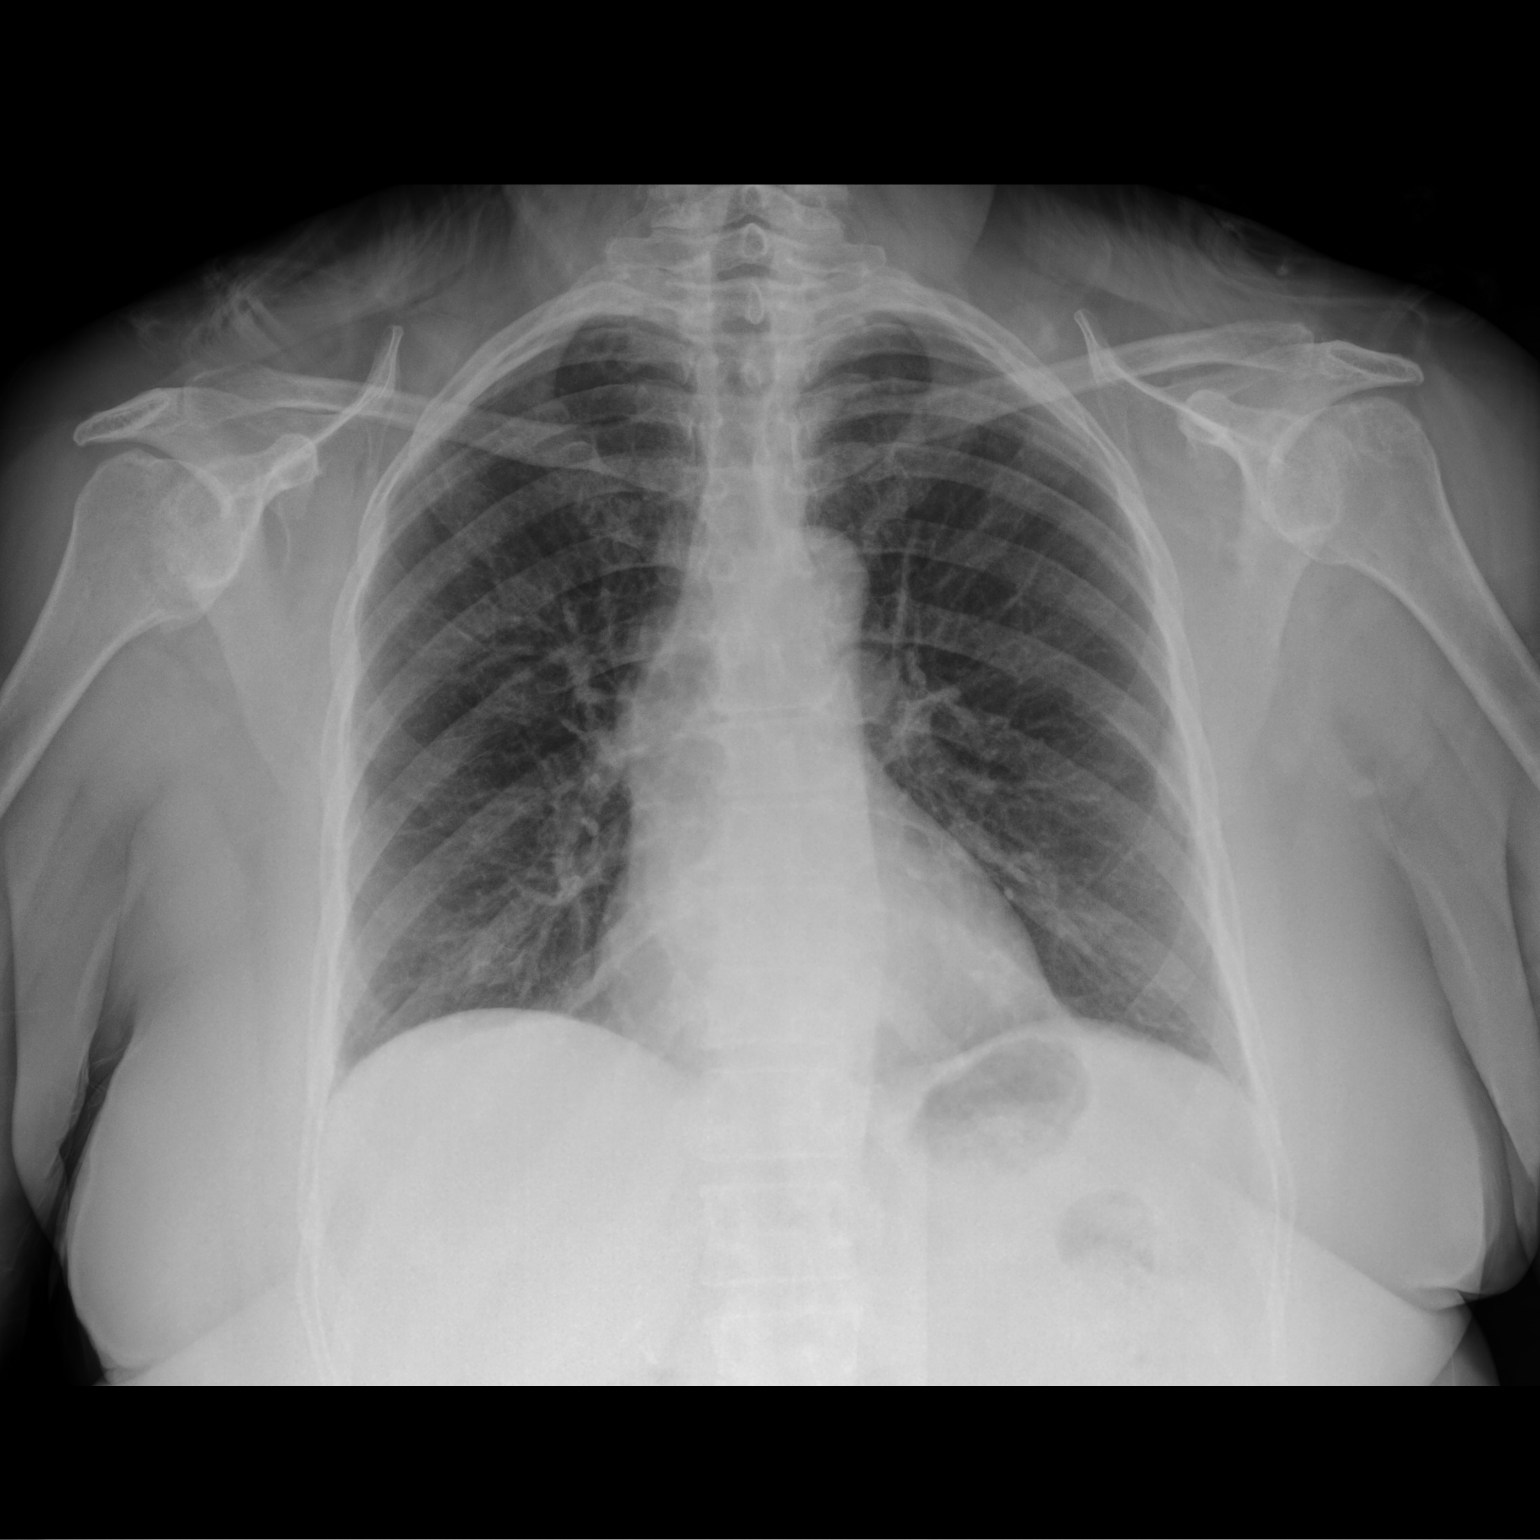

[chest lat]
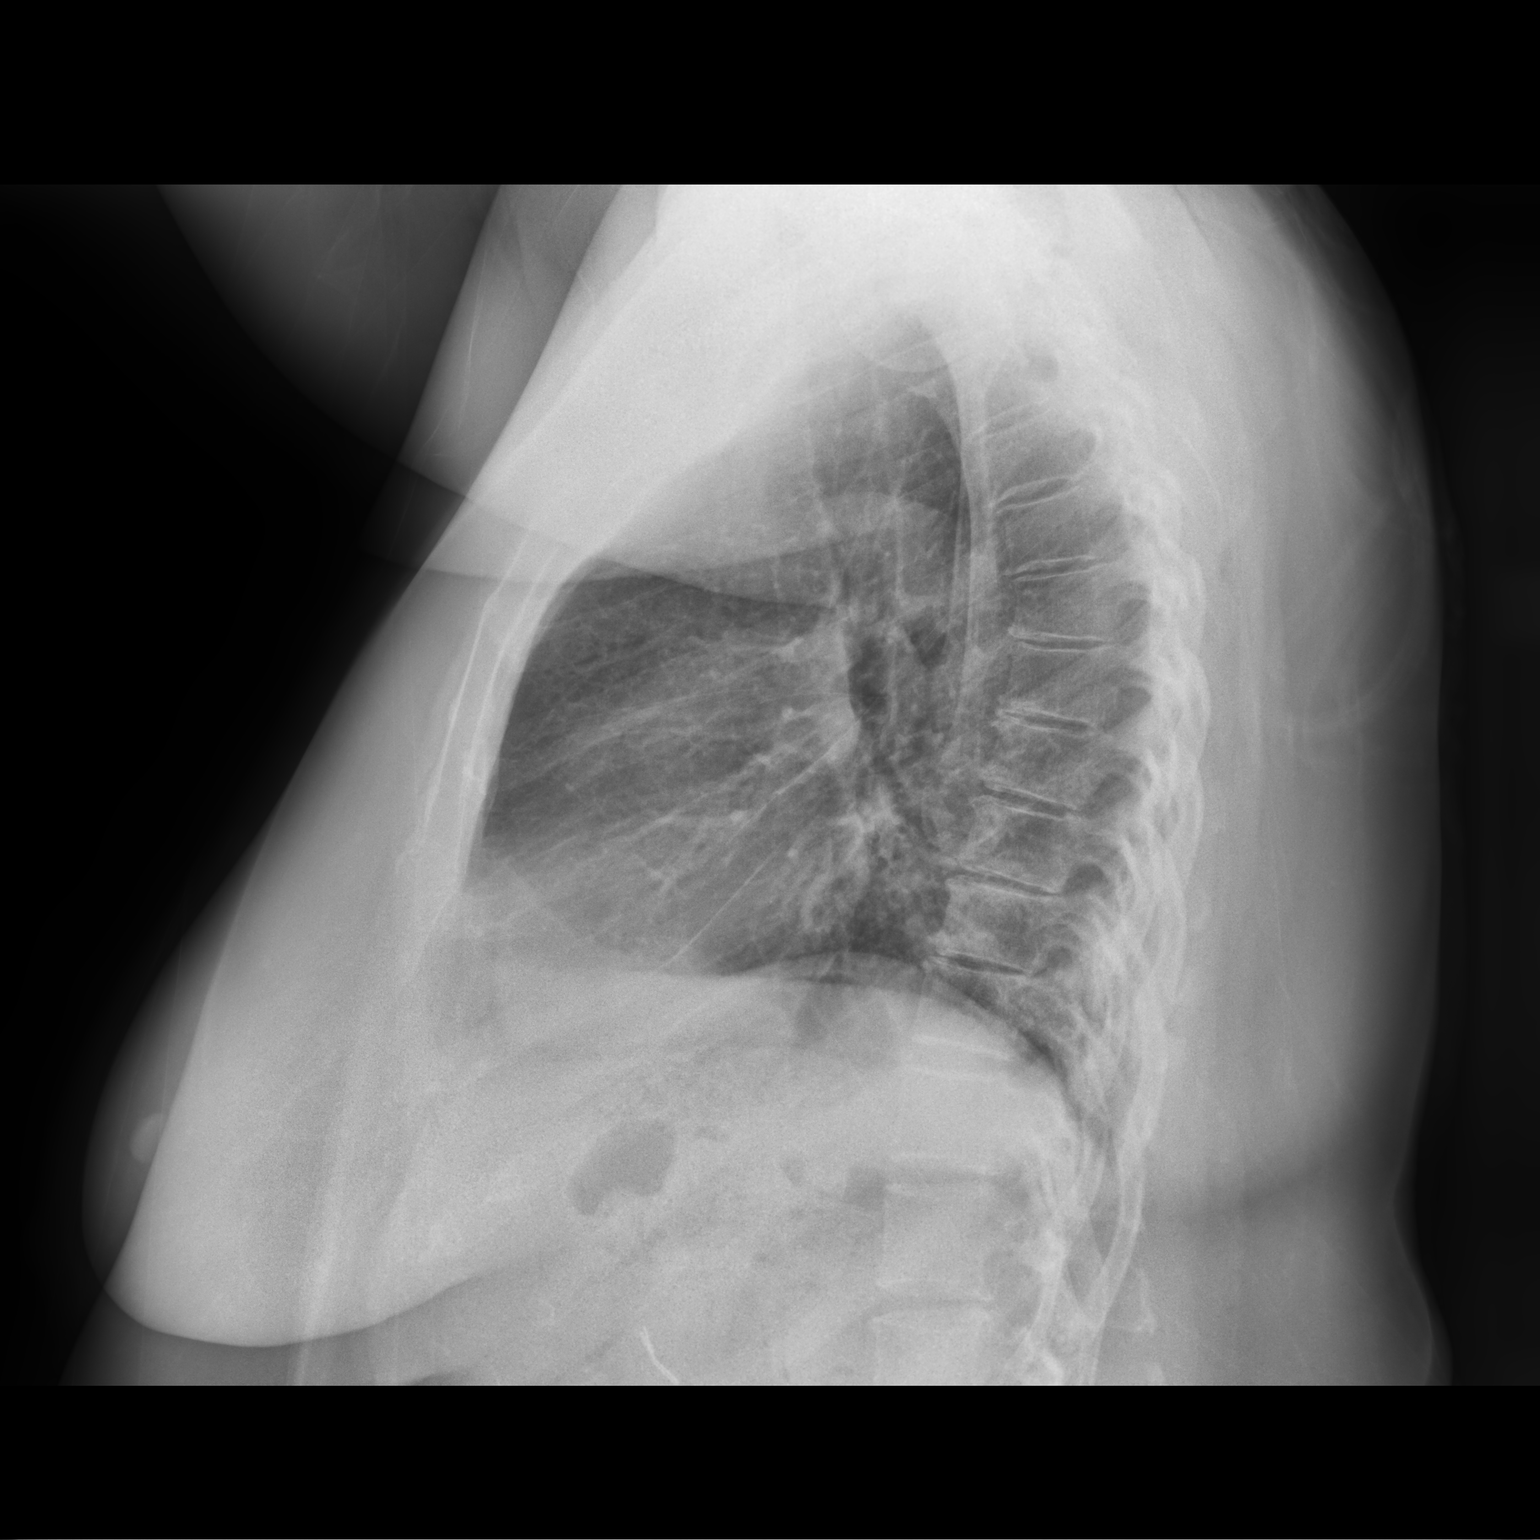

[2 of 2 positions shown; findings below may reference images not displayed]

FINDINGS: The heart size and mediastinal contours are within normal limits.
Both lungs are clear. Radiopaque surgical clips are seen within the
right upper quadrant. The visualized skeletal structures are
unremarkable.
IMPRESSION: No active cardiopulmonary disease.
